# Patient Record
Sex: Male | Born: 1960 | Race: White | Hispanic: No | Marital: Single | Smoking: Current every day smoker
Health system: Southern US, Community
[De-identification: ages and names within clinical notes are randomized; demographics above are authoritative.]

## PROBLEM LIST (undated history)

## (undated) DIAGNOSIS — I509 Heart failure, unspecified: Secondary | ICD-10-CM

## (undated) DIAGNOSIS — N185 Chronic kidney disease, stage 5: Secondary | ICD-10-CM

## (undated) DIAGNOSIS — D649 Anemia, unspecified: Secondary | ICD-10-CM

## (undated) DIAGNOSIS — I2510 Atherosclerotic heart disease of native coronary artery without angina pectoris: Secondary | ICD-10-CM

## (undated) DIAGNOSIS — F341 Dysthymic disorder: Secondary | ICD-10-CM

## (undated) DIAGNOSIS — R011 Cardiac murmur, unspecified: Secondary | ICD-10-CM

## (undated) DIAGNOSIS — G959 Disease of spinal cord, unspecified: Secondary | ICD-10-CM

## (undated) DIAGNOSIS — R0600 Dyspnea, unspecified: Secondary | ICD-10-CM

## (undated) DIAGNOSIS — R251 Tremor, unspecified: Secondary | ICD-10-CM

## (undated) DIAGNOSIS — Z720 Tobacco use: Secondary | ICD-10-CM

## (undated) DIAGNOSIS — M1990 Unspecified osteoarthritis, unspecified site: Secondary | ICD-10-CM

## (undated) DIAGNOSIS — N289 Disorder of kidney and ureter, unspecified: Secondary | ICD-10-CM

## (undated) DIAGNOSIS — F329 Major depressive disorder, single episode, unspecified: Secondary | ICD-10-CM

## (undated) DIAGNOSIS — R569 Unspecified convulsions: Secondary | ICD-10-CM

## (undated) DIAGNOSIS — E119 Type 2 diabetes mellitus without complications: Secondary | ICD-10-CM

## (undated) DIAGNOSIS — I10 Essential (primary) hypertension: Secondary | ICD-10-CM

## (undated) DIAGNOSIS — J449 Chronic obstructive pulmonary disease, unspecified: Secondary | ICD-10-CM

## (undated) DIAGNOSIS — R059 Cough, unspecified: Secondary | ICD-10-CM

## (undated) DIAGNOSIS — F419 Anxiety disorder, unspecified: Secondary | ICD-10-CM

## (undated) DIAGNOSIS — R05 Cough: Secondary | ICD-10-CM

## (undated) DIAGNOSIS — F32A Depression, unspecified: Secondary | ICD-10-CM

## (undated) DIAGNOSIS — N186 End stage renal disease: Secondary | ICD-10-CM

## (undated) HISTORY — DX: Chronic obstructive pulmonary disease, unspecified: J44.9

## (undated) HISTORY — DX: Dysthymic disorder: F34.1

## (undated) HISTORY — DX: Disease of spinal cord, unspecified: G95.9

## (undated) HISTORY — DX: Essential (primary) hypertension: I10

## (undated) HISTORY — DX: Tobacco use: Z72.0

## (undated) HISTORY — DX: Atherosclerotic heart disease of native coronary artery without angina pectoris: I25.10

## (undated) HISTORY — DX: Type 2 diabetes mellitus without complications: E11.9

## (undated) HISTORY — DX: Disorder of kidney and ureter, unspecified: N28.9

---

## 2005-07-06 ENCOUNTER — Encounter: Admission: RE | Admit: 2005-07-06 | Discharge: 2005-07-06 | Payer: Self-pay | Admitting: Thoracic Surgery

## 2008-12-12 ENCOUNTER — Emergency Department (HOSPITAL_COMMUNITY): Admission: EM | Admit: 2008-12-12 | Discharge: 2008-12-12 | Payer: Self-pay | Admitting: Emergency Medicine

## 2011-10-17 ENCOUNTER — Ambulatory Visit: Payer: Self-pay | Admitting: Internal Medicine

## 2011-10-17 VITALS — BP 132/76 | HR 72 | Temp 97.9°F | Resp 18 | Ht 74.0 in | Wt 192.0 lb

## 2011-10-17 DIAGNOSIS — S0500XA Injury of conjunctiva and corneal abrasion without foreign body, unspecified eye, initial encounter: Secondary | ICD-10-CM

## 2011-10-17 DIAGNOSIS — F341 Dysthymic disorder: Secondary | ICD-10-CM

## 2011-10-17 DIAGNOSIS — E119 Type 2 diabetes mellitus without complications: Secondary | ICD-10-CM

## 2011-10-17 DIAGNOSIS — R739 Hyperglycemia, unspecified: Secondary | ICD-10-CM

## 2011-10-17 HISTORY — DX: Type 2 diabetes mellitus without complications: E11.9

## 2011-10-17 HISTORY — DX: Dysthymic disorder: F34.1

## 2011-10-17 MED ORDER — CITALOPRAM HYDROBROMIDE 40 MG PO TABS: 40.0000 mg | ORAL_TABLET | Freq: Every day | ORAL | Status: DC

## 2011-10-17 MED ORDER — PREDNISONE 20 MG PO TABS: ORAL_TABLET | ORAL | Status: DC

## 2011-10-17 MED ORDER — TOBRAMYCIN 0.3 % OP SOLN: OPHTHALMIC | Status: DC

## 2011-10-17 MED ORDER — ALPRAZOLAM 1 MG PO TABS: 1.0000 mg | ORAL_TABLET | Freq: Every evening | ORAL | Status: AC | PRN

## 2011-10-17 NOTE — Progress Notes (Signed)
  Subjective:    Patient ID: [name] [name], male    DOB: [birth year], 51 y.o.   MRN: [id number]  [facility] been rubbing her eyes do 2 allergies for the past week and now has pain in the right with change in vision  Recent change in living arrangements/lost his job in [place] and had to move back to [place] to live with mother/25 year old son recently out of jail and back causing problems-He has continued on medicines for depression and anxiety from Dr. 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     Objective:   Physical Exam Vital signs normal Right with a large corneal defect at 9:00 seen with staining Pupils equal round reactive to light and accommodation Both conjunctiva injected Nares boggy Heart regular Chest clear Affect stable       Results for orders placed in visit on 10/17/11  POCT GLYCOSYLATED HEMOGLOBIN (HGB A1C)      Component Value Range   Hemoglobin A1C 6.4      Assessment & Plan:  Problem #1 corneal abrasion Tobramycin 2 drops 4 times a day for 5-7 days Call for ophthalmology referral if not well  Problem #2Depression with anxiety Celexa 40 daily #90 with 3 refills Alprazolam 1 mg 3 times a day #90 w/5 refills Followup six-month  Problem #3 hyperglycemia Stable with his diet  Hyperlipidemia and hypertension are not or no longer present/Recheck 6 months

## 2012-05-16 ENCOUNTER — Other Ambulatory Visit: Payer: Self-pay | Admitting: Internal Medicine

## 2012-05-16 NOTE — Telephone Encounter (Signed)
Pt was due for follow-up in September 2013

## 2017-09-09 ENCOUNTER — Encounter (HOSPITAL_COMMUNITY): Payer: Self-pay | Admitting: Emergency Medicine

## 2017-09-09 ENCOUNTER — Emergency Department (HOSPITAL_COMMUNITY): Payer: Self-pay

## 2017-09-09 ENCOUNTER — Emergency Department (HOSPITAL_COMMUNITY)
Admission: EM | Admit: 2017-09-09 | Discharge: 2017-09-10 | Disposition: A | Discharge status: Home / Self Care | Payer: Self-pay | Attending: Emergency Medicine | Admitting: Emergency Medicine

## 2017-09-09 DIAGNOSIS — I2510 Atherosclerotic heart disease of native coronary artery without angina pectoris: Secondary | ICD-10-CM

## 2017-09-09 DIAGNOSIS — F1010 Alcohol abuse, uncomplicated: Secondary | ICD-10-CM

## 2017-09-09 DIAGNOSIS — J449 Chronic obstructive pulmonary disease, unspecified: Secondary | ICD-10-CM

## 2017-09-09 DIAGNOSIS — G540 Brachial plexus disorders: Secondary | ICD-10-CM | POA: Insufficient documentation

## 2017-09-09 DIAGNOSIS — Z720 Tobacco use: Secondary | ICD-10-CM

## 2017-09-09 DIAGNOSIS — R3121 Asymptomatic microscopic hematuria: Secondary | ICD-10-CM

## 2017-09-09 DIAGNOSIS — F17200 Nicotine dependence, unspecified, uncomplicated: Secondary | ICD-10-CM | POA: Insufficient documentation

## 2017-09-09 DIAGNOSIS — R809 Proteinuria, unspecified: Secondary | ICD-10-CM

## 2017-09-09 DIAGNOSIS — G9520 Unspecified cord compression: Secondary | ICD-10-CM

## 2017-09-09 LAB — BASIC METABOLIC PANEL
ANION GAP: 12 (ref 5–15)
BUN: 19 mg/dL (ref 6–20)
CHLORIDE: 99 mmol/L — AB (ref 101–111)
CO2: 25 mmol/L (ref 22–32)
Calcium: 8.9 mg/dL (ref 8.9–10.3)
Creatinine, Ser: 1.37 mg/dL — ABNORMAL HIGH (ref 0.61–1.24)
GFR calc non Af Amer: 55 mL/min — ABNORMAL LOW (ref >60)
Glucose, Bld: 151 mg/dL — ABNORMAL HIGH (ref 65–99)
POTASSIUM: 4.9 mmol/L (ref 3.5–5.1)
Sodium: 136 mmol/L (ref 135–145)

## 2017-09-09 LAB — URINALYSIS, ROUTINE W REFLEX MICROSCOPIC
BILIRUBIN URINE: NEGATIVE
Glucose, UA: NEGATIVE mg/dL
KETONES UR: NEGATIVE mg/dL
LEUKOCYTES UA: NEGATIVE
NITRITE: NEGATIVE
PH: 6.5 (ref 5.0–8.0)
PROTEIN: 100 mg/dL — AB
Specific Gravity, Urine: 1.015 (ref 1.005–1.030)
Squamous Epithelial / LPF: NONE SEEN

## 2017-09-09 LAB — CBC
HEMATOCRIT: 50.9 % (ref 39.0–52.0)
HEMOGLOBIN: 17.5 g/dL — AB (ref 13.0–17.0)
MCH: 33.8 pg (ref 26.0–34.0)
MCHC: 34.4 g/dL (ref 30.0–36.0)
MCV: 98.5 fL (ref 78.0–100.0)
Platelets: 335 10*3/uL (ref 150–400)
RBC: 5.17 MIL/uL (ref 4.22–5.81)
RDW: 13.2 % (ref 11.5–15.5)
WBC: 11.7 10*3/uL — AB (ref 4.0–10.5)

## 2017-09-09 LAB — TROPONIN I

## 2017-09-09 LAB — CBG MONITORING, ED
GLUCOSE-CAPILLARY: 161 mg/dL — AB (ref 65–99)
Glucose-Capillary: 153 mg/dL — ABNORMAL HIGH (ref 65–99)

## 2017-09-09 MED ORDER — MELOXICAM 15 MG PO TABS: 15.0000 mg | ORAL_TABLET | Freq: Every day | ORAL | 0 refills | Status: DC

## 2017-09-09 MED ORDER — PREDNISONE 10 MG (21) PO TBPK: ORAL_TABLET | ORAL | 0 refills | Status: DC

## 2017-09-09 MED ORDER — DEXAMETHASONE SODIUM PHOSPHATE 10 MG/ML IJ SOLN
10.0000 mg | Freq: Once | INTRAMUSCULAR | Status: AC
  Administered 2017-09-09: 10 mg via INTRAVENOUS
  Filled 2017-09-09: qty 1

## 2017-09-09 MED ORDER — NICOTINE 21 MG/24HR TD PT24
21.0000 mg | MEDICATED_PATCH | Freq: Every day | TRANSDERMAL | Status: DC
  Administered 2017-09-09: 21 mg via TRANSDERMAL
  Filled 2017-09-09: qty 1

## 2017-09-09 MED ORDER — LORAZEPAM 2 MG/ML IJ SOLN
1.0000 mg | Freq: Once | INTRAMUSCULAR | Status: AC
  Administered 2017-09-09: 1 mg via INTRAVENOUS
  Filled 2017-09-09: qty 1

## 2017-09-09 NOTE — ED Notes (Addendum)
Lab notified to add on trop to existing blood.

## 2017-09-09 NOTE — ED Notes (Signed)
Neurosurgery NP at bedside

## 2017-09-09 NOTE — Discharge Instructions (Signed)
1) will need to follow-up with Dr. Saintclair Halsted in his office on Tuesday or Thursday 2) you have protein in blood in your urine and this will need to be rechecked in about 1 week 3) you have an elevated blood sugar which may elevate further with use of prednisone to treat the swelling of the disc in your neck.  If you begin to have severe hunger, severe thirst, and urinating frequently large volumes of urine this may be a sign that you have diabetes. 4) I have placed a call with our case manager to try and get you an appointment with the [facility] as soon as possible.  They are not available during the weekend hours.  Get help right away if: Your pain gets much worse and cannot be controlled with medicines. You have weakness or numbness in your hand, arm, face, or leg. You have a high fever. You have a stiff, rigid neck. You lose control of your bowels or your bladder (have incontinence). You have trouble with walking, balance, or speaking.

## 2017-09-09 NOTE — ED Notes (Signed)
Pt transported to MRI at this time 

## 2017-09-09 NOTE — ED Provider Notes (Signed)
[facility] DEPT Provider Note   CSN: [id number] Arrival date & time: 09/09/17  1300     History   Chief Complaint Chief Complaint  Patient presents with  . Tingling  . Numbness    HPI [name] [name] is a 57 y.o. male who presents the emergency department with chief complaint of left arm numbness and weakness.  Patient states that he has had progressively worsening paresthesia, numbness and pain in the left arm for the past 4 months.  It has gotten severe over the past week and he is unable to maintain strength in the arm for normal periods of time.  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  He states that about 12 years ago he was diagnosed with thoracic outlet syndrome.  He does not seek regular medical care because he is uninsured.  The patient admits to smoking cigarettes and marijuana daily.  He also drinks 6-12 beers every night but denies symptoms of withdrawal, shakes when he does not drink or seizures.  The patient states that he is also noticed twinges of pain in the left chest which she describes as feeling "like a stitch."  He denies shortness of breath or chest pain otherwise.  The patient has bilateral lower extremity numbness and tingling on a regular breast basis and pitting edema regularly.  He is also concerned because his right arm is starting to feel numb and feels the way his left arm did when it first began.Denies headaches, light headedness, visual disturbances.    HPI  History reviewed. No pertinent past medical history.  Patient Active Problem List   Diagnosis Date Noted  . Hyperglycemia 10/17/2011  . 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 No pertinent surgical history.     Home Medications    Prior to Admission medications   Medication Sig Start Date End Date Taking? Authorizing Provider  ALPRAZolam Duanne Moron) 1 MG tablet Take  1 mg by mouth once as needed for anxiety.   Yes [provider]  ibuprofen (ADVIL,MOTRIN) 200 MG tablet Take 400 mg by mouth every 6 (six) hours as needed for moderate pain.   Yes [provider]  meloxicam (MOBIC) 15 MG tablet Take 1 tablet (15 mg total) by mouth daily. Take 1 daily with food. 09/09/17   Margarita Mail, PA-C  predniSONE (STERAPRED UNI-PAK 21 TAB) 10 MG (21) TBPK tablet Use as directed 09/09/17   Margarita Mail, PA-C    Family History No family history on file.  Social History Social History   Tobacco Use  . Smoking status: Current Every Day Smoker  . Smokeless tobacco: Never Used  Substance Use Topics  . Alcohol use: Yes  . Drug use: No     Allergies   Patient has no known allergies.   Review of Systems Review of Systems  Ten systems reviewed and are negative for acute change, except as noted in the HPI.   Physical Exam Updated Vital Signs BP (!) 164/112 (BP Location: Right Arm)   Pulse 81   Temp 97.8 F (36.6 C) (Oral)   Resp 17   Ht 6' (1.829 m)   Wt 86.2 kg (190 lb)   SpO2 98%   BMI 25.77 kg/m   Physical Exam  Constitutional: He is oriented to person, place, and time. He appears well-developed and well-nourished. No distress.  HENT:  Head: Normocephalic and atraumatic.  Eyes: Conjunctivae are normal. No scleral icterus.  Neck: Normal range of motion. Neck supple.  Cardiovascular: Normal rate, regular rhythm and normal heart sounds.  Pulmonary/Chest: Effort normal and breath sounds normal. No respiratory distress.  Abdominal: Soft. There is no tenderness.  Musculoskeletal: He exhibits no edema.  Weakness of the left upper extremity  fasciculations  Neurological: He is alert and oriented to person, place, and time.  Skin: Skin is warm and dry. He is not diaphoretic.  Psychiatric: His behavior is normal.  Nursing note and vitals reviewed.    ED Treatments / Results  Labs (all labs ordered are listed, but only abnormal  results are displayed) Labs Reviewed  BASIC METABOLIC PANEL - Abnormal; Notable for the following components:      Result Value   Chloride 99 (*)    Glucose, Bld 151 (*)    Creatinine, Ser 1.37 (*)    GFR calc non Af Amer 55 (*)    All other components within normal limits  CBC - Abnormal; Notable for the following components:   WBC 11.7 (*)    Hemoglobin 17.5 (*)    All other components within normal limits  URINALYSIS, ROUTINE W REFLEX MICROSCOPIC - Abnormal; Notable for the following components:   Hgb urine dipstick LARGE (*)    Protein, ur 100 (*)    Bacteria, UA RARE (*)    All other components within normal limits  CBG MONITORING, ED - Abnormal; Notable for the following components:   Glucose-Capillary 161 (*)    All other components within normal limits  CBG MONITORING, ED - Abnormal; Notable for the following components:   Glucose-Capillary 153 (*)    All other components within normal limits  TROPONIN I    EKG  EKG Interpretation None       Radiology Dg Forearm Left  Result Date: 09/09/2017 CLINICAL DATA:  Rule out metallic foreign body prior to MRI. EXAM: LEFT FOREARM - 2 VIEW COMPARISON:  None. FINDINGS: No radiopaque foreign body. IV catheter about the distal forearm. No acute osseous abnormality. Cortical margins of the radius and ulna are intact. No focal soft tissue abnormality. IMPRESSION: No radiopaque foreign body in the forearm to preclude MRI imaging. Electronically Signed   By: Jeb Levering M.D.   On: 09/09/2017 21:32   Dg Forearm Right  Result Date: 09/09/2017 CLINICAL DATA:  Rule out metallic foreign body prior to MRI. EXAM: RIGHT FOREARM - 2 VIEW COMPARISON:  None. FINDINGS: No radiopaque foreign body. No acute osseous abnormalities. Cortical margins of the radius and ulna are intact. No focal soft tissue abnormality. IMPRESSION: No radiopaque foreign body in the right forearm to preclude MRI imaging. Electronically Signed   By: Jeb Levering  M.D.   On: 09/09/2017 21:33   Ct Head Wo Contrast  Result Date: 09/09/2017 CLINICAL DATA:  numbness and tingling to the right arm and hand that started 4 months ago EXAM: CT HEAD WITHOUT CONTRAST CT CERVICAL SPINE WITHOUT CONTRAST TECHNIQUE: Multidetector CT imaging of the head and cervical spine was performed following the standard protocol without intravenous contrast. Multiplanar CT image reconstructions of the cervical spine were also generated. COMPARISON:  None. FINDINGS: CT HEAD FINDINGS Brain: No acute intracranial hemorrhage. No focal mass lesion. No CT evidence of acute infarction. No midline shift or mass effect. No hydrocephalus. Basilar cisterns are patent. Vascular: No hyperdense vessel or unexpected calcification. Skull: Normal. Negative for fracture or focal lesion. Sinuses/Orbits: Paranasal sinuses and mastoid air cells are clear. Orbits  are clear. Other: None. CT CERVICAL SPINE FINDINGS Alignment: Normal alignment of the cervical vertebral bodies. Skull base and vertebrae: Normal craniocervical junction. No loss of vertebral body height or disc height. Normal facet articulation. No evidence of fracture. Soft tissues and spinal canal: No prevertebral soft tissue swelling. No perispinal or epidural hematoma. Disc levels: No significant neural foraminal narrowing. Central disc bulge at C4-C5 (image 56/9) indents the ventral thecal sac and contacts the spinal cord. Upper chest: Nodular density at the RIGHT lung apex partially calcified, Other: None IMPRESSION: 1. No acute intracranial findings. 2. Central disc bulge at C4-C5. 3. No significant neural foraminal narrowing identified. 4. Nodular density RIGHT lung apex could represent benign scarring. Recommend CT thorax without contrast to further evaluate. Electronically Signed   By: Suzy Bouchard M.D.   On: 09/09/2017 16:38   Ct Chest Wo Contrast  Result Date: 09/09/2017 CLINICAL DATA:  57 year old male with history of shortness of breath.  EXAM: CT CHEST WITHOUT CONTRAST TECHNIQUE: Multidetector CT imaging of the chest was performed following the standard protocol without IV contrast. COMPARISON:  Chest CT 07/06/2005. FINDINGS: Cardiovascular: Heart size is normal. There is no significant pericardial fluid, thickening or pericardial calcification. There is aortic atherosclerosis, as well as atherosclerosis of the great vessels of the mediastinum and the coronary arteries, including calcified atherosclerotic plaque in the left main, left anterior descending, left circumflex and right coronary arteries. Mediastinum/Nodes: No pathologically enlarged mediastinal or hilar lymph nodes. Please note that accurate exclusion of hilar adenopathy is limited on noncontrast CT scans. Esophagus is unremarkable in appearance. No axillary lymphadenopathy. Lungs/Pleura: Bilateral apical nodular pleuroparenchymal thickening and architectural distortion, most compatible with areas of chronic post infectious or inflammatory scarring. No acute consolidative airspace disease. No pleural effusions. Dependent areas of architectural distortion and volume loss in the lower lobes of lungs bilaterally may reflect areas of chronic scarring and/or atelectasis. No suspicious appearing pulmonary nodules or masses. Mild diffuse bronchial wall thickening with mild centrilobular and paraseptal emphysema. Upper Abdomen: Aortic atherosclerosis. Musculoskeletal: There are no aggressive appearing lytic or blastic lesions noted in the visualized portions of the skeleton. IMPRESSION: 1. No acute findings in the thorax to account for the patient's symptoms. 2. Mild diffuse bronchial wall thickening with mild centrilobular and paraseptal emphysema; imaging findings suggestive of underlying COPD. 3. Aortic atherosclerosis, in addition to left main and 3 vessel coronary artery disease. Please note that although the presence of coronary artery calcium documents the presence of coronary artery  disease, the severity of this disease and any potential stenosis cannot be assessed on this non-gated CT examination. Assessment for potential risk factor modification, dietary therapy or pharmacologic therapy may be warranted, if clinically indicated. Aortic Atherosclerosis (ICD10-I70.0) and Emphysema (ICD10-J43.9). Electronically Signed   By: Vinnie Langton M.D.   On: 09/09/2017 18:35   Ct Cervical Spine Wo Contrast  Result Date: 09/09/2017 CLINICAL DATA:  numbness and tingling to the right arm and hand that started 4 months ago EXAM: CT HEAD WITHOUT CONTRAST CT CERVICAL SPINE WITHOUT CONTRAST TECHNIQUE: Multidetector CT imaging of the head and cervical spine was performed following the standard protocol without intravenous contrast. Multiplanar CT image reconstructions of the cervical spine were also generated. COMPARISON:  None. FINDINGS: CT HEAD FINDINGS Brain: No acute intracranial hemorrhage. No focal mass lesion. No CT evidence of acute infarction. No midline shift or mass effect. No hydrocephalus. Basilar cisterns are patent. Vascular: No hyperdense vessel or unexpected calcification. Skull: Normal. Negative for fracture or focal lesion. Sinuses/Orbits:  Paranasal sinuses and mastoid air cells are clear. Orbits are clear. Other: None. CT CERVICAL SPINE FINDINGS Alignment: Normal alignment of the cervical vertebral bodies. Skull base and vertebrae: Normal craniocervical junction. No loss of vertebral body height or disc height. Normal facet articulation. No evidence of fracture. Soft tissues and spinal canal: No prevertebral soft tissue swelling. No perispinal or epidural hematoma. Disc levels: No significant neural foraminal narrowing. Central disc bulge at C4-C5 (image 56/9) indents the ventral thecal sac and contacts the spinal cord. Upper chest: Nodular density at the RIGHT lung apex partially calcified, Other: None IMPRESSION: 1. No acute intracranial findings. 2. Central disc bulge at C4-C5. 3. No  significant neural foraminal narrowing identified. 4. Nodular density RIGHT lung apex could represent benign scarring. Recommend CT thorax without contrast to further evaluate. Electronically Signed   By: Suzy Bouchard M.D.   On: 09/09/2017 16:38   Mr Cervical Spine Wo Contrast  Result Date: 09/09/2017 CLINICAL DATA:  Bilateral upper extremity numbness. EXAM: MRI CERVICAL SPINE WITHOUT CONTRAST TECHNIQUE: Multiplanar, multisequence MR imaging of the cervical spine was performed. No intravenous contrast was administered. COMPARISON:  CT cervical spine 09/09/2017 FINDINGS: Alignment: Physiologic. Vertebrae: No fracture, evidence of discitis, or bone lesion. Cord: There is hyperintense T2-weighted signal within the spinal cord at the C4-5 level. Posterior Fossa, vertebral arteries, paraspinal tissues: Visualized posterior fossa is normal. Vertebral artery flow voids are preserved. No prevertebral soft tissue swelling. Disc levels: C1-C2: Normal. C2-C3: Normal disc space and facets. No spinal canal or neuroforaminal stenosis. C3-C4: Small central disc protrusion, greatest in the right subarticular zone. No central spinal canal stenosis. Moderate right foraminal stenosis. C4-C5: Intermediate sized central disc extrusion with inferior migration to the C5 pedicle level. This causes severe spinal canal stenosis, narrowing the caliber of the central spinal canal to 2 mm. There is compression of the spinal cord with deformity and hyperintense T2-weighted signal. Severe bilateral neural foraminal stenosis C5-C6: Medium-sized right subarticular disc osteophyte complex with moderate narrowing of the right neural foramen. No spinal canal stenosis. C6-C7: Normal disc space and facets. No spinal canal or neuroforaminal stenosis. C7-T1: Normal disc space and facets. No spinal canal or neuroforaminal stenosis. IMPRESSION: 1. Cord compression at the C4-5 level secondary to intermediate sized central disc extrusion with inferior  migration. There is hyperintense T2-weighted signal within the cord, likely indicating edema. 2. Severe bilateral C4-5 neural foraminal narrowing. 3. Moderate right neural foraminal stenosis at C3-4 and C5-6. Critical Value/emergent results were called by telephone at the time of interpretation on 09/09/2017 at 10:52 pm to Dr. Margarita Mail , who verbally acknowledged these results. Electronically Signed   By: Ulyses Jarred M.D.   On: 09/09/2017 22:59   Dg Hand 2 View Right  Result Date: 09/09/2017 CLINICAL DATA:  Evaluate for metallic foreign body prior to MRI. EXAM: RIGHT HAND - 2 VIEW COMPARISON:  None. FINDINGS: External sensor obscures the distal right third finger. Otherwise no radiopaque foreign body. No fracture, dislocation or suspicious focal osseous lesion. Mild osteoarthritis at the first carpometacarpal joint and at the second through fourth metacarpophalangeal joints. IMPRESSION: No radiopaque foreign body.  Mild polyarticular osteoarthritis. Electronically Signed   By: Ilona Sorrel M.D.   On: 09/09/2017 21:32   Dg Hand 2 View Left  Result Date: 09/09/2017 CLINICAL DATA:  Previous injury with concern for metallic foreign body EXAM: LEFT HAND - 2 VIEW COMPARISON:  None. FINDINGS: Frontal and lateral views were obtained. No metallic foreign body evident. Intravenous catheter present. No  fracture or dislocation. Joint spaces appear normal. No erosive change. IMPRESSION: No fracture or dislocation. No evident metallic foreign body. No appreciable arthropathy. Electronically Signed   By: Lowella Grip III M.D.   On: 09/09/2017 21:31   Dg Humerus Left  Result Date: 09/09/2017 CLINICAL DATA:  Rule out metallic foreign body prior to MRI. EXAM: LEFT HUMERUS - 2+ VIEW COMPARISON:  None. FINDINGS: No radiopaque foreign body in the left humerus. There is no evidence of fracture or other focal bone lesions. Soft tissues are unremarkable. IMPRESSION: No radiopaque foreign body in the left humerus to  preclude MRI imaging. Electronically Signed   By: Jeb Levering M.D.   On: 09/09/2017 21:34   Dg Humerus Right  Result Date: 09/09/2017 CLINICAL DATA:  Rule out metallic foreign body prior to MRI. EXAM: RIGHT HUMERUS - 2+ VIEW COMPARISON:  None. FINDINGS: No radiopaque foreign body in the right humerus/upper arm. There is no evidence of fracture or other focal bone lesions. Soft tissues are unremarkable. IMPRESSION: No radiopaque foreign body in the right humerus to preclude MRI imaging Electronically Signed   By: Jeb Levering M.D.   On: 09/09/2017 21:35    Procedures Procedures (including critical care time)  Medications Ordered in ED Medications  nicotine (NICODERM CQ - dosed in mg/24 hours) patch 21 mg (21 mg Transdermal Patch Applied 09/09/17 2018)  LORazepam (ATIVAN) injection 1 mg (1 mg Intravenous Given 09/09/17 2215)  dexamethasone (DECADRON) injection 10 mg (10 mg Intravenous Given 09/09/17 2306)     Initial Impression / Assessment and Plan / ED Course  I have reviewed the triage vital signs and the nursing notes.  Pertinent labs & imaging results that were available during my care of the patient were reviewed by me and considered in my medical decision making (see chart for details).     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  His other diagnoses are listed below.  The patient has been advised of all findings.  He was seen in the emergency department by Dr. Windy Carina nurse practitioner and will follow up in his office on Tuesday or Thursday of the coming week.  They advised giving a Sterapred Dosepak.  He was given IV Decadron here in the emergency department .   I have also contacted our case manager for the patient is he needs inpatient follow-up as soon as possible.  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

## 2017-09-09 NOTE — ED Triage Notes (Signed)
Patient here from home with complaints of numbness and tingling to the right arm and hand that started 4 months ago. Increasing worse. Chest pain "off and on" for 1 month. Reports "Im old school, I never go to the doctor".

## 2017-09-09 NOTE — Consult Note (Signed)
Reason for Consult:cervical disc herniation Referring Physician: EDP  [name] [name] is an 57 y.o. male.   HPI:  57 year old patient comes in today with progressive weakness. States that he has been dealing with NT and weakness in his arms and hands for the last 4 months. His left 4th and 5th finger have been contracted for the last 6 months. He denies any pain in his neck or arms. Reports having trouble picking things up and holding items especially in his left hand. He has not fallen recently but states that his gait has been off. Does have a history of thoracic outlet syndrome. He works in [employer] and states that the weakness has been interfering with his job. He is a heavy drinker.   History reviewed. No pertinent past medical history.  History reviewed. No pertinent surgical history.  No Known Allergies  Social History   Tobacco Use  . Smoking status: Current Every Day Smoker  . Smokeless tobacco: Never Used  Substance Use Topics  . Alcohol use: Yes    No family history on file.   Review of Systems  Positive ROS: NTW in bilateral upper extremities  All other systems have been reviewed and were otherwise negative with the exception of those mentioned in the HPI and as above.  Objective: Vital signs in last 24 hours: Temp:  [97.7 F (36.5 C)-97.8 F (36.6 C)] 97.8 F (36.6 C) (03/16 1422) Pulse Rate:  [58-85] 85 (03/16 2036) Resp:  [14-25] 22 (03/16 2036) BP: (131-163)/(76-98) 163/98 (03/16 2036) SpO2:  [96 %-99 %] 96 % (03/16 2036) Weight:  [86.2 kg (190 lb)] 86.2 kg (190 lb) (03/16 1342)  General Appearance: Alert, cooperative, no distress, appears stated age Head: Normocephalic, without obvious abnormality, atraumatic Eyes: PERRL, conjunctiva/corneas clear, EOM's intact, fundi benign, both eyes      Ears: Normal TM's and external ear canals, both ears Throat: benign Neck: Supple, symmetrical, trachea midline, no adenopathy; thyroid: No  enlargement/tenderness/nodules; no carotid bruit or JVD Back: Symmetric, no curvature, ROM normal, no CVA tenderness Lungs: Clear to auscultation bilaterally, respirations unlabored Heart: Regular rate and rhythm, S1 and S2 normal, no murmur, rub or gallop Abdomen: Soft, non-tender, bowel sounds active all four quadrants, no masses, no organomegaly Extremities: Extremities normal, atraumatic, no cyanosis or edema Pulses: 2+ and symmetric all extremities Skin: Skin color, texture, turgor normal, no rashes or lesions  NEUROLOGIC:   Mental status: A&O x4, no aphasia, good attention span, Memory and fund of knowledge Motor Exam - grossly normal, normal tone and bulk Sensory Exam - grossly normal Reflexes: hyperreflexive, Positive Hoffman's Coordination - grossly normal Gait - grossly normal Balance - grossly normal Cranial Nerves: I: smell Not tested  II: visual acuity  OS: na    OD: na  II: visual fields   II: pupils Equal, round, reactive to light  III,VII: ptosis None  III,IV,VI: extraocular muscles  Full ROM  V: mastication   V: facial light touch sensation    V,VII: corneal reflex    VII: facial muscle function - upper    VII: facial muscle function - lower   VIII: hearing   IX: soft palate elevation    IX,X: gag reflex   XI: trapezius strength    XI: sternocleidomastoid strength   XI: neck flexion strength    XII: tongue strength  Normal    Data Review Lab Results  Component Value Date   WBC 11.7 (H) 09/09/2017   HGB 17.5 (H) 09/09/2017   HCT  50.9 09/09/2017   MCV 98.5 09/09/2017   PLT 335 09/09/2017   Lab Results  Component Value Date   NA 136 09/09/2017   K 4.9 09/09/2017   CL 99 (L) 09/09/2017   CO2 25 09/09/2017   BUN 19 09/09/2017   CREATININE 1.37 (H) 09/09/2017   GLUCOSE 151 (H) 09/09/2017   No results found for: INR, PROTIME  Radiology: Ct Head Wo Contrast  Result Date: 09/09/2017 CLINICAL DATA:  numbness and tingling to the right arm and hand  that started 4 months ago EXAM: CT HEAD WITHOUT CONTRAST CT CERVICAL SPINE WITHOUT CONTRAST TECHNIQUE: Multidetector CT imaging of the head and cervical spine was performed following the standard protocol without intravenous contrast. Multiplanar CT image reconstructions of the cervical spine were also generated. COMPARISON:  None. FINDINGS: CT HEAD FINDINGS Brain: No acute intracranial hemorrhage. No focal mass lesion. No CT evidence of acute infarction. No midline shift or mass effect. No hydrocephalus. Basilar cisterns are patent. Vascular: No hyperdense vessel or unexpected calcification. Skull: Normal. Negative for fracture or focal lesion. Sinuses/Orbits: Paranasal sinuses and mastoid air cells are clear. Orbits are clear. Other: None. CT CERVICAL SPINE FINDINGS Alignment: Normal alignment of the cervical vertebral bodies. Skull base and vertebrae: Normal craniocervical junction. No loss of vertebral body height or disc height. Normal facet articulation. No evidence of fracture. Soft tissues and spinal canal: No prevertebral soft tissue swelling. No perispinal or epidural hematoma. Disc levels: No significant neural foraminal narrowing. Central disc bulge at C4-C5 (image 56/9) indents the ventral thecal sac and contacts the spinal cord. Upper chest: Nodular density at the RIGHT lung apex partially calcified, Other: None IMPRESSION: 1. No acute intracranial findings. 2. Central disc bulge at C4-C5. 3. No significant neural foraminal narrowing identified. 4. Nodular density RIGHT lung apex could represent benign scarring. Recommend CT thorax without contrast to further evaluate. Electronically Signed   By: Suzy Bouchard M.D.   On: 09/09/2017 16:38   Ct Chest Wo Contrast  Result Date: 09/09/2017 CLINICAL DATA:  57 year old male with history of shortness of breath. EXAM: CT CHEST WITHOUT CONTRAST TECHNIQUE: Multidetector CT imaging of the chest was performed following the standard protocol without IV  contrast. COMPARISON:  Chest CT 07/06/2005. FINDINGS: Cardiovascular: Heart size is normal. There is no significant pericardial fluid, thickening or pericardial calcification. There is aortic atherosclerosis, as well as atherosclerosis of the great vessels of the mediastinum and the coronary arteries, including calcified atherosclerotic plaque in the left main, left anterior descending, left circumflex and right coronary arteries. Mediastinum/Nodes: No pathologically enlarged mediastinal or hilar lymph nodes. Please note that accurate exclusion of hilar adenopathy is limited on noncontrast CT scans. Esophagus is unremarkable in appearance. No axillary lymphadenopathy. Lungs/Pleura: Bilateral apical nodular pleuroparenchymal thickening and architectural distortion, most compatible with areas of chronic post infectious or inflammatory scarring. No acute consolidative airspace disease. No pleural effusions. Dependent areas of architectural distortion and volume loss in the lower lobes of lungs bilaterally may reflect areas of chronic scarring and/or atelectasis. No suspicious appearing pulmonary nodules or masses. Mild diffuse bronchial wall thickening with mild centrilobular and paraseptal emphysema. Upper Abdomen: Aortic atherosclerosis. Musculoskeletal: There are no aggressive appearing lytic or blastic lesions noted in the visualized portions of the skeleton. IMPRESSION: 1. No acute findings in the thorax to account for the patient's symptoms. 2. Mild diffuse bronchial wall thickening with mild centrilobular and paraseptal emphysema; imaging findings suggestive of underlying COPD. 3. Aortic atherosclerosis, in addition to left main and 3 vessel  coronary artery disease. Please note that although the presence of coronary artery calcium documents the presence of coronary artery disease, the severity of this disease and any potential stenosis cannot be assessed on this non-gated CT examination. Assessment for potential  risk factor modification, dietary therapy or pharmacologic therapy may be warranted, if clinically indicated. Aortic Atherosclerosis (ICD10-I70.0) and Emphysema (ICD10-J43.9). Electronically Signed   By: Vinnie Langton M.D.   On: 09/09/2017 18:35   Ct Cervical Spine Wo Contrast  Result Date: 09/09/2017 CLINICAL DATA:  numbness and tingling to the right arm and hand that started 4 months ago EXAM: CT HEAD WITHOUT CONTRAST CT CERVICAL SPINE WITHOUT CONTRAST TECHNIQUE: Multidetector CT imaging of the head and cervical spine was performed following the standard protocol without intravenous contrast. Multiplanar CT image reconstructions of the cervical spine were also generated. COMPARISON:  None. FINDINGS: CT HEAD FINDINGS Brain: No acute intracranial hemorrhage. No focal mass lesion. No CT evidence of acute infarction. No midline shift or mass effect. No hydrocephalus. Basilar cisterns are patent. Vascular: No hyperdense vessel or unexpected calcification. Skull: Normal. Negative for fracture or focal lesion. Sinuses/Orbits: Paranasal sinuses and mastoid air cells are clear. Orbits are clear. Other: None. CT CERVICAL SPINE FINDINGS Alignment: Normal alignment of the cervical vertebral bodies. Skull base and vertebrae: Normal craniocervical junction. No loss of vertebral body height or disc height. Normal facet articulation. No evidence of fracture. Soft tissues and spinal canal: No prevertebral soft tissue swelling. No perispinal or epidural hematoma. Disc levels: No significant neural foraminal narrowing. Central disc bulge at C4-C5 (image 56/9) indents the ventral thecal sac and contacts the spinal cord. Upper chest: Nodular density at the RIGHT lung apex partially calcified, Other: None IMPRESSION: 1. No acute intracranial findings. 2. Central disc bulge at C4-C5. 3. No significant neural foraminal narrowing identified. 4. Nodular density RIGHT lung apex could represent benign scarring. Recommend CT thorax  without contrast to further evaluate. Electronically Signed   By: Suzy Bouchard M.D.   On: 09/09/2017 16:38     Assessment/Plan: 57 year old patient comes in with progressive paraesthesias and weakness in upper extremities over the last couple months. CT reviewed which showed a moderate size bulging disc at C4-5 indenting the ventral thecal sac. Will get an MRI of his cervical spine. Given the chronicity of his symptoms I do not believe that he needs any emergent surgery at this time pending MRI results. Suggest sending him on steroids should his MRI not show anything needing emergent intervention. Follow up with [facility] in the office this week with Dr. Saintclair Halsted.    [name] [name] [name] [name] 09/09/2017 8:40 PM

## 2017-09-09 NOTE — ED Notes (Signed)
Pt provided with crackers and sprite per PA

## 2017-09-10 ENCOUNTER — Encounter: Payer: Self-pay | Admitting: Surgery

## 2017-09-10 ENCOUNTER — Encounter: Payer: Self-pay | Admitting: Neurosurgery

## 2017-09-10 NOTE — Progress Notes (Signed)
ED CM received in basket message concerning assisting patient with establishing follow up. Patient does not have a PCP or insurance, CM forwarded patient information to [facility] CM to reach out to schedule an appointment. ED CM will follow up with clinic's CM tomorrow.

## 2017-09-10 NOTE — Progress Notes (Unsigned)
Reviewed patient's MRI scan which shows severe stenosis at C4-5 and C5-6. Patient apparently was discharged from the ER prior to [place] being notified the MRI was complete for final determination about disposition. Upon review the MRI scan was noted that the patient had been discharged from the ER with appropriate follow-up however MRI scan did show significant stenosis spinal cord compression. I have attempted to contact the patient did leave him a voicemail to return my call today to assess his progress and determine whether he was clinically stable and improving and would be able to follow up on Tuesday or if he showed any evidence of decline, come back to the ER to be admitted the hospital.

## 2017-09-11 ENCOUNTER — Telehealth: Payer: Self-pay

## 2017-09-11 NOTE — Telephone Encounter (Signed)
Message received from Laurena Slimmer, RN CM requesting a hospital follow up appointment for the patient at [facility]. Call placed to the patient and explained the services provided at [facility] including, primary care, financial counseling, pharmacy assistance, social work.  He stated that he called Neurosurgery and has an appointment tomorrow - 09/12/17.  He is very anxious to hear what the plan will be for his care. He also spoke about his mounting bills and the financial strain he is experiencing. 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 This CM explained about [employer]  and [employer] and the benefits of meeting with a Development worker, community. Marland Kitchen He was focused on his appointment tomorrow and would not commit to scheduling an appointment with primary care. He repeated back the call back # for [facility] and said he would call back after the neurosurgery appointment.   Update provided to Wendi Maya, RN CM

## 2017-09-11 NOTE — Telephone Encounter (Signed)
Opened in error

## 2017-09-12 ENCOUNTER — Other Ambulatory Visit: Payer: Self-pay | Admitting: Neurosurgery

## 2017-09-12 ENCOUNTER — Telehealth: Payer: Self-pay | Admitting: General Practice

## 2017-09-12 NOTE — Telephone Encounter (Signed)
Call placed to the patient.  He said that he saw the neurosurgeon and is scheduled for surgery on 09/14/17. He noted that he also went to DSS and obtained an application for emergency medicaid.   He said that he will schedule a follow up appointment at [facility] and financial counseling after he has his surgery.  He expressed gratitude for all that assisted him in the ED and how smooth the process was to get this surgery scheduled so quickly.   Update provided to Laurena Slimmer, RN CM

## 2017-09-12 NOTE — Telephone Encounter (Signed)
Patient called with some questions to ask jane. Please fu at your earliest convenience.

## 2017-09-13 ENCOUNTER — Encounter (HOSPITAL_COMMUNITY): Payer: Self-pay | Admitting: *Deleted

## 2017-09-14 ENCOUNTER — Ambulatory Visit (HOSPITAL_COMMUNITY): Payer: Self-pay

## 2017-09-14 ENCOUNTER — Ambulatory Visit (HOSPITAL_COMMUNITY)
Admission: RE | Disposition: A | Discharge status: Home / Self Care | Payer: Self-pay | Source: Ambulatory Visit | Attending: Neurosurgery

## 2017-09-14 ENCOUNTER — Encounter (HOSPITAL_COMMUNITY): Payer: Self-pay | Admitting: Urology

## 2017-09-14 ENCOUNTER — Ambulatory Visit (HOSPITAL_COMMUNITY): Payer: Self-pay | Admitting: Certified Registered Nurse Anesthetist

## 2017-09-14 ENCOUNTER — Other Ambulatory Visit: Payer: Self-pay

## 2017-09-14 ENCOUNTER — Ambulatory Visit (HOSPITAL_COMMUNITY)
Admission: RE | Admit: 2017-09-14 | Discharge: 2017-09-15 | Disposition: A | Discharge status: Home / Self Care | Payer: Self-pay | Source: Ambulatory Visit | Attending: Neurosurgery | Admitting: Neurosurgery

## 2017-09-14 DIAGNOSIS — I700 Atherosclerosis of aorta: Secondary | ICD-10-CM | POA: Insufficient documentation

## 2017-09-14 DIAGNOSIS — Z79899 Other long term (current) drug therapy: Secondary | ICD-10-CM | POA: Insufficient documentation

## 2017-09-14 DIAGNOSIS — G959 Disease of spinal cord, unspecified: Secondary | ICD-10-CM

## 2017-09-14 DIAGNOSIS — M4712 Other spondylosis with myelopathy, cervical region: Secondary | ICD-10-CM | POA: Insufficient documentation

## 2017-09-14 DIAGNOSIS — F17200 Nicotine dependence, unspecified, uncomplicated: Secondary | ICD-10-CM | POA: Insufficient documentation

## 2017-09-14 DIAGNOSIS — M4802 Spinal stenosis, cervical region: Secondary | ICD-10-CM | POA: Insufficient documentation

## 2017-09-14 DIAGNOSIS — I2510 Atherosclerotic heart disease of native coronary artery without angina pectoris: Secondary | ICD-10-CM | POA: Insufficient documentation

## 2017-09-14 DIAGNOSIS — F419 Anxiety disorder, unspecified: Secondary | ICD-10-CM | POA: Insufficient documentation

## 2017-09-14 DIAGNOSIS — F329 Major depressive disorder, single episode, unspecified: Secondary | ICD-10-CM | POA: Insufficient documentation

## 2017-09-14 HISTORY — PX: ANTERIOR CERVICAL DECOMP/DISCECTOMY FUSION: SHX1161

## 2017-09-14 HISTORY — DX: Depression, unspecified: F32.A

## 2017-09-14 HISTORY — DX: Dyspnea, unspecified: R06.00

## 2017-09-14 HISTORY — DX: Cough, unspecified: R05.9

## 2017-09-14 HISTORY — DX: Anxiety disorder, unspecified: F41.9

## 2017-09-14 HISTORY — DX: Disease of spinal cord, unspecified: G95.9

## 2017-09-14 HISTORY — DX: Cough: R05

## 2017-09-14 HISTORY — DX: Major depressive disorder, single episode, unspecified: F32.9

## 2017-09-14 LAB — ABO/RH: ABO/RH(D): A POS

## 2017-09-14 LAB — TYPE AND SCREEN
ABO/RH(D): A POS
ANTIBODY SCREEN: NEGATIVE

## 2017-09-14 SURGERY — ANTERIOR CERVICAL DECOMPRESSION/DISCECTOMY FUSION 2 LEVELS
Anesthesia: General

## 2017-09-14 MED ORDER — MIDAZOLAM HCL 2 MG/2ML IJ SOLN
INTRAMUSCULAR | Status: DC | PRN
  Administered 2017-09-14: 2 mg via INTRAVENOUS

## 2017-09-14 MED ORDER — OXYCODONE HCL 5 MG PO TABS
ORAL_TABLET | ORAL | Status: AC
  Filled 2017-09-14: qty 1

## 2017-09-14 MED ORDER — CEFAZOLIN SODIUM-DEXTROSE 2-3 GM-%(50ML) IV SOLR
INTRAVENOUS | Status: DC | PRN
  Administered 2017-09-14: 2 g via INTRAVENOUS

## 2017-09-14 MED ORDER — 0.9 % SODIUM CHLORIDE (POUR BTL) OPTIME
TOPICAL | Status: DC | PRN
  Administered 2017-09-14: 1000 mL

## 2017-09-14 MED ORDER — IBUPROFEN 200 MG PO TABS: 400.0000 mg | ORAL_TABLET | Freq: Four times a day (QID) | ORAL | Status: DC | PRN

## 2017-09-14 MED ORDER — FENTANYL CITRATE (PF) 100 MCG/2ML IJ SOLN
25.0000 ug | INTRAMUSCULAR | Status: DC | PRN
  Administered 2017-09-14 (×2): 50 ug via INTRAVENOUS

## 2017-09-14 MED ORDER — DEXAMETHASONE SODIUM PHOSPHATE 10 MG/ML IJ SOLN
INTRAMUSCULAR | Status: DC | PRN
  Administered 2017-09-14: 10 mg via INTRAVENOUS

## 2017-09-14 MED ORDER — ONDANSETRON HCL 4 MG PO TABS: 4.0000 mg | ORAL_TABLET | Freq: Four times a day (QID) | ORAL | Status: DC | PRN

## 2017-09-14 MED ORDER — ONDANSETRON HCL 4 MG/2ML IJ SOLN
INTRAMUSCULAR | Status: AC
Start: 2017-09-14 — End: 2017-09-14
  Filled 2017-09-14: qty 2

## 2017-09-14 MED ORDER — SPIRITUS FRUMENTI
1.0000 | Freq: Four times a day (QID) | ORAL | Status: DC
  Administered 2017-09-14: 1 via ORAL
  Filled 2017-09-14 (×4): qty 1

## 2017-09-14 MED ORDER — PROPOFOL 10 MG/ML IV BOLUS
INTRAVENOUS | Status: DC | PRN
  Administered 2017-09-14: 200 mg via INTRAVENOUS

## 2017-09-14 MED ORDER — FENTANYL CITRATE (PF) 250 MCG/5ML IJ SOLN
INTRAMUSCULAR | Status: AC
  Filled 2017-09-14: qty 5

## 2017-09-14 MED ORDER — PHENOL 1.4 % MT LIQD: 1.0000 | OROMUCOSAL | Status: DC | PRN

## 2017-09-14 MED ORDER — ACETAMINOPHEN 160 MG/5ML PO SOLN: 325.0000 mg | ORAL | Status: DC | PRN

## 2017-09-14 MED ORDER — CEFAZOLIN SODIUM 1 G IJ SOLR
INTRAMUSCULAR | Status: AC
  Filled 2017-09-14: qty 20

## 2017-09-14 MED ORDER — DEXTROSE 5 % IV SOLN
INTRAVENOUS | Status: DC | PRN
  Administered 2017-09-14: 30 ug/min via INTRAVENOUS

## 2017-09-14 MED ORDER — SODIUM CHLORIDE 0.9 % IV SOLN: 250.0000 mL | INTRAVENOUS | Status: DC

## 2017-09-14 MED ORDER — MELOXICAM 7.5 MG PO TABS
15.0000 mg | ORAL_TABLET | Freq: Every day | ORAL | Status: DC
  Administered 2017-09-14 – 2017-09-15 (×2): 15 mg via ORAL
  Filled 2017-09-14 (×2): qty 2

## 2017-09-14 MED ORDER — SODIUM CHLORIDE 0.9% FLUSH
3.0000 mL | Freq: Two times a day (BID) | INTRAVENOUS | Status: DC
  Administered 2017-09-14 – 2017-09-15 (×2): 3 mL via INTRAVENOUS

## 2017-09-14 MED ORDER — DEXAMETHASONE SODIUM PHOSPHATE 10 MG/ML IJ SOLN
INTRAMUSCULAR | Status: AC
  Filled 2017-09-14: qty 1

## 2017-09-14 MED ORDER — SUGAMMADEX SODIUM 200 MG/2ML IV SOLN
INTRAVENOUS | Status: DC | PRN
  Administered 2017-09-14: 200 mg via INTRAVENOUS

## 2017-09-14 MED ORDER — EPHEDRINE SULFATE-NACL 50-0.9 MG/10ML-% IV SOSY
PREFILLED_SYRINGE | INTRAVENOUS | Status: DC | PRN
  Administered 2017-09-14: 5 mg via INTRAVENOUS

## 2017-09-14 MED ORDER — FENTANYL CITRATE (PF) 100 MCG/2ML IJ SOLN
INTRAMUSCULAR | Status: AC
  Administered 2017-09-14: 50 ug via INTRAVENOUS
  Filled 2017-09-14: qty 2

## 2017-09-14 MED ORDER — PREDNISONE 10 MG (21) PO TBPK: ORAL_TABLET | ORAL | Status: DC

## 2017-09-14 MED ORDER — ONDANSETRON HCL 4 MG/2ML IJ SOLN
INTRAMUSCULAR | Status: DC | PRN
  Administered 2017-09-14: 4 mg via INTRAVENOUS

## 2017-09-14 MED ORDER — SODIUM CHLORIDE 0.9 % IR SOLN
Status: DC | PRN
  Administered 2017-09-14: 16:00:00

## 2017-09-14 MED ORDER — MIDAZOLAM HCL 2 MG/2ML IJ SOLN
INTRAMUSCULAR | Status: AC
  Filled 2017-09-14: qty 2

## 2017-09-14 MED ORDER — CYCLOBENZAPRINE HCL 10 MG PO TABS: 10.0000 mg | ORAL_TABLET | Freq: Three times a day (TID) | ORAL | Status: DC | PRN

## 2017-09-14 MED ORDER — LACTATED RINGERS IV SOLN
INTRAVENOUS | Status: DC
  Administered 2017-09-14: 14:00:00 via INTRAVENOUS

## 2017-09-14 MED ORDER — THROMBIN 5000 UNITS EX SOLR
CUTANEOUS | Status: AC
Start: 2017-09-14 — End: 2017-09-14
  Filled 2017-09-14: qty 5000

## 2017-09-14 MED ORDER — DEXMEDETOMIDINE HCL IN NACL 200 MCG/50ML IV SOLN
INTRAVENOUS | Status: AC
  Filled 2017-09-14: qty 50

## 2017-09-14 MED ORDER — ROCURONIUM BROMIDE 10 MG/ML (PF) SYRINGE
PREFILLED_SYRINGE | INTRAVENOUS | Status: AC
Start: 2017-09-14 — End: 2017-09-14
  Filled 2017-09-14: qty 5

## 2017-09-14 MED ORDER — ROCURONIUM BROMIDE 10 MG/ML (PF) SYRINGE
PREFILLED_SYRINGE | INTRAVENOUS | Status: DC | PRN
  Administered 2017-09-14: 50 mg via INTRAVENOUS
  Administered 2017-09-14 (×2): 30 mg via INTRAVENOUS

## 2017-09-14 MED ORDER — MENTHOL 3 MG MT LOZG
1.0000 | LOZENGE | OROMUCOSAL | Status: DC | PRN
Start: 2017-09-14 — End: 2017-09-15

## 2017-09-14 MED ORDER — HYDROMORPHONE HCL 1 MG/ML IJ SOLN
0.5000 mg | INTRAMUSCULAR | Status: DC | PRN
  Administered 2017-09-14 – 2017-09-15 (×3): 0.5 mg via INTRAVENOUS
  Filled 2017-09-14 (×3): qty 0.5

## 2017-09-14 MED ORDER — PROPOFOL 10 MG/ML IV BOLUS
INTRAVENOUS | Status: AC
  Filled 2017-09-14: qty 20

## 2017-09-14 MED ORDER — EPHEDRINE 5 MG/ML INJ
INTRAVENOUS | Status: AC
Start: 2017-09-14 — End: 2017-09-14
  Filled 2017-09-14: qty 10

## 2017-09-14 MED ORDER — SUGAMMADEX SODIUM 200 MG/2ML IV SOLN
INTRAVENOUS | Status: AC
  Filled 2017-09-14: qty 2

## 2017-09-14 MED ORDER — LIDOCAINE HCL (CARDIAC) 20 MG/ML IV SOLN
INTRAVENOUS | Status: AC
  Filled 2017-09-14: qty 5

## 2017-09-14 MED ORDER — CEFAZOLIN SODIUM-DEXTROSE 2-4 GM/100ML-% IV SOLN
2.0000 g | Freq: Three times a day (TID) | INTRAVENOUS | Status: AC
  Administered 2017-09-14 – 2017-09-15 (×2): 2 g via INTRAVENOUS
  Filled 2017-09-14 (×2): qty 100

## 2017-09-14 MED ORDER — OXYCODONE HCL 5 MG/5ML PO SOLN: 5.0000 mg | Freq: Once | ORAL | Status: AC | PRN

## 2017-09-14 MED ORDER — ONDANSETRON HCL 4 MG/2ML IJ SOLN
4.0000 mg | Freq: Four times a day (QID) | INTRAMUSCULAR | Status: DC | PRN
Start: 2017-09-14 — End: 2017-09-15

## 2017-09-14 MED ORDER — SODIUM CHLORIDE 0.9% FLUSH
3.0000 mL | INTRAVENOUS | Status: DC | PRN
  Administered 2017-09-15: 3 mL via INTRAVENOUS
  Filled 2017-09-14: qty 3

## 2017-09-14 MED ORDER — PANTOPRAZOLE SODIUM 40 MG IV SOLR
40.0000 mg | Freq: Every day | INTRAVENOUS | Status: DC
  Administered 2017-09-14: 40 mg via INTRAVENOUS
  Filled 2017-09-14: qty 40

## 2017-09-14 MED ORDER — ALPRAZOLAM 0.5 MG PO TABS
0.5000 mg | ORAL_TABLET | Freq: Every evening | ORAL | Status: DC | PRN
  Administered 2017-09-14 – 2017-09-15 (×2): 0.5 mg via ORAL
  Filled 2017-09-14 (×2): qty 1

## 2017-09-14 MED ORDER — ACETAMINOPHEN 650 MG RE SUPP: 650.0000 mg | RECTAL | Status: DC | PRN

## 2017-09-14 MED ORDER — THROMBIN 5000 UNITS EX SOLR
CUTANEOUS | Status: AC
  Filled 2017-09-14: qty 5000

## 2017-09-14 MED ORDER — FENTANYL CITRATE (PF) 250 MCG/5ML IJ SOLN
INTRAMUSCULAR | Status: DC | PRN
  Administered 2017-09-14 (×2): 50 ug via INTRAVENOUS
  Administered 2017-09-14: 150 ug via INTRAVENOUS
  Administered 2017-09-14: 50 ug via INTRAVENOUS

## 2017-09-14 MED ORDER — LIDOCAINE 2% (20 MG/ML) 5 ML SYRINGE
INTRAMUSCULAR | Status: DC | PRN
  Administered 2017-09-14: 60 mg via INTRAVENOUS

## 2017-09-14 MED ORDER — DEXMEDETOMIDINE HCL IN NACL 400 MCG/100ML IV SOLN
INTRAVENOUS | Status: DC | PRN
Start: 2017-09-14 — End: 2017-09-14
  Administered 2017-09-14: 0.7 ug/kg/h via INTRAVENOUS

## 2017-09-14 MED ORDER — OXYCODONE HCL 5 MG PO TABS
5.0000 mg | ORAL_TABLET | Freq: Once | ORAL | Status: AC | PRN
  Administered 2017-09-14: 5 mg via ORAL

## 2017-09-14 MED ORDER — ACETAMINOPHEN 325 MG PO TABS: 650.0000 mg | ORAL_TABLET | ORAL | Status: DC | PRN

## 2017-09-14 MED ORDER — THROMBIN (RECOMBINANT) 5000 UNITS EX SOLR
OROMUCOSAL | Status: DC | PRN
  Administered 2017-09-14 (×2): via TOPICAL

## 2017-09-14 MED ORDER — OXYCODONE HCL 5 MG PO TABS
10.0000 mg | ORAL_TABLET | ORAL | Status: DC | PRN
  Administered 2017-09-14 – 2017-09-15 (×2): 10 mg via ORAL
  Filled 2017-09-14 (×2): qty 2

## 2017-09-14 MED ORDER — PHENYLEPHRINE 40 MCG/ML (10ML) SYRINGE FOR IV PUSH (FOR BLOOD PRESSURE SUPPORT)
PREFILLED_SYRINGE | INTRAVENOUS | Status: AC
  Filled 2017-09-14: qty 10

## 2017-09-14 MED ORDER — ALUM & MAG HYDROXIDE-SIMETH 200-200-20 MG/5ML PO SUSP: 30.0000 mL | Freq: Four times a day (QID) | ORAL | Status: DC | PRN

## 2017-09-14 MED ORDER — ACETAMINOPHEN 325 MG PO TABS: 325.0000 mg | ORAL_TABLET | ORAL | Status: DC | PRN

## 2017-09-14 SURGICAL SUPPLY — 60 items
ADH SKN CLS APL DERMABOND .7 (GAUZE/BANDAGES/DRESSINGS) ×1
APL SKNCLS STERI-STRIP NONHPOA (GAUZE/BANDAGES/DRESSINGS) ×1
BAG DECANTER FOR FLEXI CONT (MISCELLANEOUS) ×2 IMPLANT
BENZOIN TINCTURE PRP APPL 2/3 (GAUZE/BANDAGES/DRESSINGS) ×2 IMPLANT
BIT DRILL SPINE QC 14 (BIT) ×1 IMPLANT
BUR MATCHSTICK NEURO 3.0 LAGG (BURR) ×2 IMPLANT
CANISTER SUCT 3000ML PPV (MISCELLANEOUS) ×2 IMPLANT
CARTRIDGE OIL MAESTRO DRILL (MISCELLANEOUS) ×1 IMPLANT
DERMABOND ADVANCED (GAUZE/BANDAGES/DRESSINGS) ×1
DERMABOND ADVANCED .7 DNX12 (GAUZE/BANDAGES/DRESSINGS) ×1 IMPLANT
DIFFUSER DRILL AIR PNEUMATIC (MISCELLANEOUS) ×2 IMPLANT
DRAPE C-ARM 42X72 X-RAY (DRAPES) ×4 IMPLANT
DRAPE LAPAROTOMY 100X72 PEDS (DRAPES) ×2 IMPLANT
DRAPE MICROSCOPE LEICA (MISCELLANEOUS) ×2 IMPLANT
DRSG OPSITE POSTOP 4X6 (GAUZE/BANDAGES/DRESSINGS) ×1 IMPLANT
DURAPREP 6ML APPLICATOR 50/CS (WOUND CARE) ×2 IMPLANT
ELECT COATED BLADE 2.86 ST (ELECTRODE) ×2 IMPLANT
ELECT REM PT RETURN 9FT ADLT (ELECTROSURGICAL) ×2
ELECTRODE REM PT RTRN 9FT ADLT (ELECTROSURGICAL) ×1 IMPLANT
GAUZE SPONGE 4X4 12PLY STRL (GAUZE/BANDAGES/DRESSINGS) ×2 IMPLANT
GAUZE SPONGE 4X4 16PLY XRAY LF (GAUZE/BANDAGES/DRESSINGS) IMPLANT
GLOVE BIO SURGEON STRL SZ7 (GLOVE) IMPLANT
GLOVE BIO SURGEON STRL SZ8 (GLOVE) ×2 IMPLANT
GLOVE BIOGEL PI IND STRL 7.0 (GLOVE) IMPLANT
GLOVE BIOGEL PI INDICATOR 7.0 (GLOVE)
GLOVE EXAM NITRILE LRG STRL (GLOVE) IMPLANT
GLOVE EXAM NITRILE XL STR (GLOVE) IMPLANT
GLOVE EXAM NITRILE XS STR PU (GLOVE) IMPLANT
GLOVE INDICATOR 8.5 STRL (GLOVE) ×2 IMPLANT
GOWN STRL REUS W/ TWL LRG LVL3 (GOWN DISPOSABLE) IMPLANT
GOWN STRL REUS W/ TWL XL LVL3 (GOWN DISPOSABLE) ×1 IMPLANT
GOWN STRL REUS W/TWL 2XL LVL3 (GOWN DISPOSABLE) IMPLANT
GOWN STRL REUS W/TWL LRG LVL3 (GOWN DISPOSABLE)
GOWN STRL REUS W/TWL XL LVL3 (GOWN DISPOSABLE) ×2
HALTER HD/CHIN CERV TRACTION D (MISCELLANEOUS) ×2 IMPLANT
HEMOSTAT POWDER KIT SURGIFOAM (HEMOSTASIS) ×4 IMPLANT
KIT BASIN OR (CUSTOM PROCEDURE TRAY) ×2 IMPLANT
KIT ROOM TURNOVER OR (KITS) ×2 IMPLANT
NDL SPNL 20GX3.5 QUINCKE YW (NEEDLE) ×1 IMPLANT
NEEDLE SPNL 20GX3.5 QUINCKE YW (NEEDLE) ×2 IMPLANT
NS IRRIG 1000ML POUR BTL (IV SOLUTION) ×2 IMPLANT
OIL CARTRIDGE MAESTRO DRILL (MISCELLANEOUS) ×2
PACK LAMINECTOMY NEURO (CUSTOM PROCEDURE TRAY) ×2 IMPLANT
PAD ARMBOARD 7.5X6 YLW CONV (MISCELLANEOUS) ×6 IMPLANT
PIN DISTRACTION 14MM (PIN) ×2 IMPLANT
PLATE ANT CERV XTEND 2 LV 32 (Plate) ×2 IMPLANT
PUTTY DBM ALLOFUSE 2.5CC (Bone Implant) ×2 IMPLANT
RUBBERBAND STERILE (MISCELLANEOUS) ×4 IMPLANT
SCREW XTD VAR 4.2 SELF TAP (Screw) ×6 IMPLANT
SPACER COLONIAL ACDF TPS 8 LG (Spacer) ×4 IMPLANT
SPONGE INTESTINAL PEANUT (DISPOSABLE) ×2 IMPLANT
SPONGE SURGIFOAM ABS GEL SZ50 (HEMOSTASIS) ×1 IMPLANT
STRIP CLOSURE SKIN 1/2X4 (GAUZE/BANDAGES/DRESSINGS) ×2 IMPLANT
SUT VIC AB 3-0 SH 8-18 (SUTURE) ×3 IMPLANT
SUT VICRYL 4-0 PS2 18IN ABS (SUTURE) ×2 IMPLANT
TAPE CLOTH 4X10 WHT NS (GAUZE/BANDAGES/DRESSINGS) IMPLANT
TOWEL GREEN STERILE (TOWEL DISPOSABLE) ×2 IMPLANT
TOWEL GREEN STERILE FF (TOWEL DISPOSABLE) ×2 IMPLANT
TRAP SPECIMEN MUCOUS 40CC (MISCELLANEOUS) ×2 IMPLANT
WATER STERILE IRR 1000ML POUR (IV SOLUTION) ×2 IMPLANT

## 2017-09-14 NOTE — Progress Notes (Signed)
Received via stretcher from PACU, gait steady with 1 assist, voided, taking po's well.  Pain level #8 medicated per order.  Oriented to room, safety precautions, plan of care & verbalized understanding.

## 2017-09-14 NOTE — H&P (Signed)
Justan Gaede Sr. is an 57 y.o. male.   Chief Complaint: numbness tingling weakness in his arms HPI: 57 year old gentleman presented to the ER couple days ago with numbness tingling weakness in his hands has been stable for 3 weeks of progressive over 6 months. Workup revealed severe cord compression secondary to score clinical exam was consistent with myelopathy. Patient presents now for ACDF C4-5 C5-6. I've extensively gone over the risks and benefits of the operation with the patient as well as perioperative course expectations of outcome and alternatives of surgery and he understands and agrees to proceed forward.  Past Medical History:  Diagnosis Date  . Anxiety   . Cough   . Depression   . Dyspnea     Past Surgical History:  Procedure Laterality Date  . NO PAST SURGERIES      History reviewed. No pertinent family history. Social History:  reports that he has been smoking.  He has a 84.00 pack-year smoking history. He has never used smokeless tobacco. He reports that he drinks alcohol. He reports that he does not use drugs.  Allergies: No Known Allergies  Medications Prior to Admission  Medication Sig Dispense Refill  . ALPRAZolam (XANAX) 0.5 MG tablet Take 0.5 mg by mouth at bedtime as needed for anxiety.    Marland Kitchen ibuprofen (ADVIL,MOTRIN) 200 MG tablet Take 400 mg by mouth every 6 (six) hours as needed for moderate pain.    . meloxicam (MOBIC) 15 MG tablet Take 1 tablet (15 mg total) by mouth daily. Take 1 daily with food. 10 tablet 0  . predniSONE (STERAPRED UNI-PAK 21 TAB) 10 MG (21) TBPK tablet Use as directed (Patient taking differently: Use as directed day3 (09/12/2017)) 21 tablet 0    Results for orders placed or performed during the hospital encounter of 09/14/17 (from the past 48 hour(s))  Type and screen [place]     Status: None   Collection Time: 09/14/17  1:41 PM  Result Value Ref Range   ABO/RH(D) A POS    Antibody Screen NEG    Sample Expiration       09/17/2017 Performed at [facility], [place] [address] [place], [place] [address]    No results found.  Review of Systems  Musculoskeletal: Positive for joint pain, myalgias and neck pain.  Neurological: Positive for tingling and sensory change.    Blood pressure (!) 177/92, pulse 80, temperature 98 F (36.7 C), temperature source Oral, resp. rate 18, height 6' (1.829 m), weight 86.2 kg (190 lb), SpO2 99 %. Physical Exam  Constitutional: He is oriented to person, place, and time. He appears well-developed.  HENT:  Head: Normocephalic.  Eyes: Pupils are equal, round, and reactive to light.  Neck: Normal range of motion.  Respiratory: Effort normal.  GI: Soft.  Neurological: He is alert and oriented to person, place, and time. He has normal strength. GCS eye subscore is 4. GCS verbal subscore is 5. GCS motor subscore is 6.  Strength is 5 of 5 deltoid, bicep, wrist flexion, wrist extensor, hand intrinsics weakness and triceps bilaterally. 4+ out of 5 does have slight contracture of his right hand decreased sensation in both hands. Lower extremity strength out of 5 but he is hyperreflexic with clonus right greater than left  Skin: Skin is warm and dry.     Assessment/Plan A 57 year old presents for ACDF C4-5 C5-6  Brittannie Tawney P, MD 09/14/2017, 3:14 PM

## 2017-09-14 NOTE — Anesthesia Procedure Notes (Signed)
Procedure Name: Intubation Date/Time: 09/14/2017 4:15 PM Performed by: Julieta Bellini, CRNA Pre-anesthesia Checklist: Patient identified, Emergency Drugs available, Suction available and Patient being monitored Patient Re-evaluated:Patient Re-evaluated prior to induction Oxygen Delivery Method: Circle system utilized Preoxygenation: Pre-oxygenation with 100% oxygen Induction Type: IV induction Ventilation: Mask ventilation with difficulty and Two handed mask ventilation required Laryngoscope Size: Mac and 4 Grade View: Grade II Tube type: Oral Tube size: 7.5 mm Number of attempts: 1 Airway Equipment and Method: Stylet Placement Confirmation: ETT inserted through vocal cords under direct vision,  positive ETCO2 and breath sounds checked- equal and bilateral Secured at: 23 cm Tube secured with: Tape Dental Injury: Teeth and Oropharynx as per pre-operative assessment

## 2017-09-14 NOTE — Transfer of Care (Signed)
Immediate Anesthesia Transfer of Care Note  Patient: [name] Slade Sr.  Procedure(s) Performed: ANTERIOR CERVICAL DECOMPRESSION/DISCECTOMY FUSIONCERVICAL 4- CERVICAL 5, CERVICAL 5- CERVICAL 6 (N/A )  Patient Location: PACU  Anesthesia Type:General  Level of Consciousness: drowsy and responds to stimulation  Airway & Oxygen Therapy: Patient Spontanous Breathing  Post-op Assessment: Report given to RN and Post -op Vital signs reviewed and stable  Post vital signs: Reviewed and stable  Last Vitals:  Vitals Value Taken Time  BP 127/74 09/14/2017  5:58 PM  Temp    Pulse 65 09/14/2017  6:01 PM  Resp 15 09/14/2017  6:01 PM  SpO2 95 % 09/14/2017  6:01 PM  Vitals shown include unvalidated device data.  Last Pain:  Vitals:   09/14/17 1319  TempSrc:   PainSc: 8       Patients Stated Pain Goal: 5 (53/20/23 3435)  Complications: No apparent anesthesia complications

## 2017-09-14 NOTE — Anesthesia Preprocedure Evaluation (Signed)
Anesthesia Evaluation  Patient identified by MRN, date of birth, ID band Patient awake    Reviewed: Allergy & Precautions, NPO status , Patient's Chart, lab work & pertinent test results  History of Anesthesia Complications Negative for: history of anesthetic complications  Airway Mallampati: II  TM Distance: >3 FB Neck ROM: Full    Dental  (+) Edentulous Upper, Edentulous Lower   Pulmonary shortness of breath, Current Smoker,    breath sounds clear to auscultation       Cardiovascular negative cardio ROS   Rhythm:Regular     Neuro/Psych PSYCHIATRIC DISORDERS Anxiety Depression    GI/Hepatic negative GI ROS, (+)     substance abuse  alcohol use,   Endo/Other  negative endocrine ROS  Renal/GU negative Renal ROS     Musculoskeletal   Abdominal   Peds  Hematology negative hematology ROS (+)   Anesthesia Other Findings   Reproductive/Obstetrics                             Anesthesia Physical Anesthesia Plan  ASA: III  Anesthesia Plan: General   Post-op Pain Management:    Induction: Intravenous  PONV Risk Score and Plan: 1 and Ondansetron and Dexamethasone  Airway Management Planned: Oral ETT  Additional Equipment: None  Intra-op Plan:   Post-operative Plan: Extubation in OR  Informed Consent: I have reviewed the patients History and Physical, chart, labs and discussed the procedure including the risks, benefits and alternatives for the proposed anesthesia with the patient or authorized representative who has indicated his/her understanding and acceptance.   Dental advisory given  Plan Discussed with: CRNA and Surgeon  Anesthesia Plan Comments:         Anesthesia Quick Evaluation

## 2017-09-14 NOTE — Op Note (Signed)
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 #2 anterior cervical plating with globus extend plating system  Surgeon: Dominica Severin Jordane Hisle  Asst.: Sherley Bounds  Anesthesia: Gen.  EBL: Minimal  History of present illness: Patient is a very pleasant 57 year old gentleman who's had months of progressive numbness tingling weakness in his hands and difficult to walk and workup revealed severe cord compression signal changes cord from cervical stenosis at C4-5 and C5-6. Due to patient's progression of clinical syndrome imaging findings a conservative treatment I recommended anterior cervical discectomies and fusion at those levels. I extensively went over the risks and benefits of the procedure the patient as well as perioperative course expectations of outcome and alternatives of surgery and he understands and agrees to proceed forward.  Operative procedure: Patient brought into the or was induced on general seizure positioned supine the neck in slight extension in 5 pounds of halter traction the right side of his neck was prepped and draped in routine sterile fashion then a preoperative x-ray localize the appropriate level so a curvilinear incision was made just off midline to the interbody the sternomastoid the super slide platysmas dissected and divided longitudinally the avascular plane to sternomastoid and strap as was developed down to the free fashion. The prevertebral fascia was then dissected away with Kitners. Interoperative x-ray confirmed with desiccation appropriate level so annulotomy's were made with a 15 blade scalpel marked the disc space longus close reflected laterally and self-retaining retractors were placed. Then anterior  aspects were bitten off of tooth number Kerrison punch disc space were cleaned out with Epstein curettes then both the space were drilled down with a high-speed drill capturing the bone shavings in a mucous trap. Under microscopic illumination first working at C4-5 identify the posterior annulus and posterior complex as well as posterior last liquid. This is all removed there was several large free fragments disc it migrated subligamentous causing severe cord compression. There was also an adherent semi-adherent inflammatory membrane that was removed decompress the central canal aggressively under biting both endplates decompressed the central canal and both C5 pedicles were identified both C5 nerve roots decompressed. This was then packed with Gelfoam and attention taken to C5-6. C5-6 was drilled down a similar fashion capturing the bone shavings in a mucous trap large artifacts were bitten off the inferior aspect of the C5 vertebral body was a go see her get meticulous in space was maintained after adequate decompression achieved at both levels to 8 mm parallel cages were packed with locally harvested autograft mixed with putty and inserted. Then a globus extend plate was selected all screws excellent purchase locking mechanism was gauged some additional putty and bone was packed along the inferior aspect of the plate besides and to the halls. Then after meticulous he states was made and the wounds closed in layers with after Vicryl skin screws were enforced subcuticular Dermabond benzo and Steri-Strips and sterile dressing was applied and patient recovered in stable condition. At the end the case all needle counts sponge counts were correct.

## 2017-09-15 ENCOUNTER — Encounter (HOSPITAL_COMMUNITY): Payer: Self-pay | Admitting: Neurosurgery

## 2017-09-15 MED ORDER — PANTOPRAZOLE SODIUM 40 MG PO TBEC: 40.0000 mg | DELAYED_RELEASE_TABLET | Freq: Every day | ORAL | Status: DC

## 2017-09-15 MED ORDER — OXYCODONE HCL 10 MG PO TABS: 10.0000 mg | ORAL_TABLET | Freq: Four times a day (QID) | ORAL | 0 refills | Status: DC | PRN

## 2017-09-15 MED ORDER — CYCLOBENZAPRINE HCL 10 MG PO TABS: 10.0000 mg | ORAL_TABLET | Freq: Three times a day (TID) | ORAL | 1 refills | Status: DC | PRN

## 2017-09-15 MED FILL — Thrombin For Soln 5000 Unit: CUTANEOUS | Qty: 5000 | Status: AC

## 2017-09-15 MED FILL — Gelatin Absorbable MT Powder: OROMUCOSAL | Qty: 1 | Status: AC

## 2017-09-15 NOTE — Progress Notes (Signed)
09/15/17 1220  PT Visit Information  Last PT Received On 09/15/17  Assistance Needed +1  History of Present Illness 57 yo s/p ACDF due to cervical myelopathy. PMH includes anxiety and depression.   Precautions  Precautions Fall;Cervical  Precaution Booklet Issued Yes (comment)  Precaution Comments Pt able to recall most precautions from OT session, however, required continuous cues to maintain throughout session; especially not to turn head.   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 difficulties  Pain Assessment  Pain Assessment 0-10  Pain Score 8  Pain Location neck  Pain Descriptors / Indicators Aching;Discomfort  Pain Intervention(s) Limited activity within patient's tolerance;Monitored during session;Repositioned  Cognition  Arousal/Alertness Awake/alert  Behavior During Therapy WFL for tasks assessed/performed  Overall Cognitive Status Within Functional Limits for tasks assessed  Upper Extremity Assessment  Upper Extremity Assessment Defer to OT evaluation  Lower Extremity Assessment  Lower Extremity Assessment Generalized weakness (LLE>RLE )  Cervical / Trunk Assessment  Cervical / Trunk Assessment Other exceptions  Cervical / Trunk Exceptions s/p ACDF   Bed Mobility  General bed mobility comments Standing in bathroom upon entry.   Transfers  Overall transfer level Needs  assistance  Equipment used Straight cane  Transfers Sit to/from Stand  Sit to Stand Min guard  General transfer comment Unsteady upon standing requiring min guard assist for steadying.   Ambulation/Gait  Ambulation/Gait assistance Min guard  Ambulation Distance (Feet) 200 Feet  Assistive device Straight cane  Gait Pattern/deviations Step-through pattern;Decreased stride length  General Gait Details Slow, unsteady gait requiring min guard for steadying assist. Educated about technique to use cane and pt with improved steadiness. Required occasional cues for sequencing with cane. Educated about generalized walking program to perform at home.   Gait velocity Decreased   Gait velocity interpretation Below normal speed for age/gender  Stairs Yes  Stairs assistance Min guard;Min assist  Stair Management With cane;Forwards;Step to pattern  Number of Stairs 3 (X2)  General stair comments Performed stair training X 2. During first attempt, pt requiring max verbal cues for technique and min A for steadying. During second attempt, pt with improved technique and required little to no cueing, able to teach back technique, and required min guard for steadying. Educated pt's girlfriend about technique as well.   Balance  Overall balance assessment Needs assistance  Sitting-balance support No upper extremity supported;Feet supported  Sitting balance-Leahy Scale Good  Standing balance support Single extremity supported;No upper extremity supported  Standing balance-Leahy Scale Fair  Standing balance comment Able to maintain static standing without UE support.   General Comments  General comments (skin integrity, edema, etc.) Pt's girlfriend in the room during session.   PT - End of Session  Equipment Utilized During Treatment Gait belt  Activity Tolerance Patient tolerated treatment well  Patient left in bed;with call bell/phone within reach;with family/visitor present  Nurse Communication Mobility  status  PT Assessment  PT Recommendation/Assessment Patient needs continued PT services  PT Visit  Diagnosis Unsteadiness on feet (R26.81);Other abnormalities of gait and mobility (R26.89);Pain  Pain - part of body  (neck )  PT Problem List Decreased strength;Decreased balance;Decreased mobility;Decreased knowledge of use of DME;Decreased safety awareness;Decreased knowledge of precautions;Pain  PT Plan  PT Frequency (ACUTE ONLY) Min 5X/week  PT Treatment/Interventions (ACUTE ONLY) DME instruction;Gait training;Stair training;Functional mobility training;Therapeutic activities;Balance training;Therapeutic exercise;Neuromuscular re-education;Patient/family education  AM-PAC PT "6 Clicks" Daily Activity Outcome Measure  Difficulty turning over in bed (including adjusting bedclothes, sheets and blankets)? 3  Difficulty moving from lying on back to sitting on the side of the bed?  3  Difficulty sitting down on and standing up from a chair with arms (e.g., wheelchair, bedside commode, etc,.)? 1  Help needed moving to and from a bed to chair (including a wheelchair)? 3  Help needed walking in hospital room? 3  Help needed climbing 3-5 steps with a railing?  3  6 Click Score 16  Mobility G Code  CK  PT Recommendation  Follow Up Recommendations Supervision for mobility/OOB;Other (comment) (would benefit from outpatient PT once cleared by MD)  PT equipment Cane  Individuals Consulted  Consulted and Agree with Results and Recommendations Patient;Family member/caregiver  Family Member Consulted girlfriend   Acute Rehab PT Goals  Patient Stated Goal get his strength back  PT Goal Formulation With patient  Time For Goal Achievement 09/29/17  Potential to Achieve Goals Good  PT Time Calculation  PT Start Time (ACUTE ONLY) 1047  PT Stop Time (ACUTE ONLY) 1110  PT Time Calculation (min) (ACUTE ONLY) 23 min  PT General Charges  $$ ACUTE PT VISIT 1 Visit  PT Evaluation  $PT Eval Low Complexity 1  Low  PT Treatments  $Gait Training 8-22 mins  Written Expression  Dominant Hand Right   Patient is s/p above surgery resulting in the deficits listed below (see PT Problem List). Pt presenting with unsteadiness during gait and decreased awareness of cervical precautions. Required continuous cues throughout to maintain precautions. Practiced gait and stair training with cane and pt reports increased steadiness. Educated about precautions and generalized walking program. Patient will benefit from skilled PT to increase their independence and safety with mobility (while adhering to their precautions) to allow discharge to the venue listed below.  Leighton Ruff, PT, DPT  Acute Rehabilitation Services  Pager: [phone]

## 2017-09-15 NOTE — Progress Notes (Addendum)
Occupational Therapy Evaluation Patient Details Name: [name] Rollo Sr. 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 Pt staes he has had more difficulty with self care and walking over the last 2 months. Pt with generalized LUE weakness, stronger proximally. Pt seen for an additional visit to establish a HEP and complete ADL education. Feel pt will benefit from neuro outpt OT after cleared by Dr. Saintclair Halsted to increase functional use of LUE and facilitate possible return to work. Pt appreciative.     Follow Up Recommendations  Supervision - Intermittent;Outpatient OT(after cleared by Dr. Saintclair Halsted)    Equipment Recommendations  None recommended by OT    Recommendations for Other Services PT consult     Precautions / Restrictions Precautions Precautions: Fall;Cervical Precaution Booklet Issued: Yes (comment)      Mobility Bed Mobility Overal bed mobility: Needs Assistance Bed Mobility: Sidelying to Sit   Sidelying to sit: Supervision       General bed mobility comments: Educated on log rolling technique; mod vc to adhere  Transfers Overall transfer level: Needs assistance   Transfers: Sit to/from [employer] Transfers Sit to Stand: Supervision Stand pivot transfers: Min guard       General transfer comment: unsteady initially with transfers; Pt states he has been unsteady for 1-2 months    Balance Overall balance assessment: Needs assistance   Sitting balance-Leahy Scale: Good       Standing balance-Leahy Scale: Fair                             ADL either performed or assessed with clinical judgement   ADL Overall ADL's : Needs assistance/impaired Eating/Feeding: Modified independent   Grooming: Set  up;Supervision/safety;Standing Grooming Details (indicate cue type and reason): vc for cervical precautions Upper Body Bathing: Set up;Sitting   Lower Body Bathing: Supervison/ safety;Set up;Sit to/from stand   Upper Body Dressing : Set up;Sitting;Supervision/safety   Lower Body Dressing: Set up;Supervision/safety;Sit to/from stand   Toilet Transfer: Designer, fashion/clothing and Hygiene: Modified independent       Functional mobility during ADLs: Min guard General ADL Comments: Completed education regarding home safety adn preventing falls; Educated on proper set up of home to increase adherance to cervical precautions; pt requires multiple cues to adhere to precuations   Issued elastic shoestrings; reacher to assist with ADL and home management     Vision         Perception     Praxis      Pertinent Vitals/Pain Pain Assessment: 0-10 Pain Score: 5  Pain Location: neck Pain Descriptors / Indicators: Aching;Discomfort Pain Intervention(s): Limited activity within patient's tolerance     Hand Dominance Right   Extremity/Trunk Assessment Upper Extremity Assessment Upper Extremity Assessment: LUE deficits/detail LUE Deficits / Details: LUE general weakness; greater wekness distally; apparent atrophy around L shoulder girdle; ablet o make gross grasp, unable to fully extend fingers and demonstrates "claw hand". Able to achieve full PROM in extension; able to oppose thumb to index and middle fingers; poor in-hand manipulation skills and gfine motor skills; unable to pick up coins/small objects; "numb" - drops items frequently LUE Sensation: decreased light touch;decreased proprioception LUE Coordination: decreased fine motor;decreased gross motor   Lower  Extremity Assessment Lower Extremity Assessment: Defer to PT evaluation   Cervical / Trunk Assessment Cervical / Trunk Assessment: Other exceptions(s/p ACDF)   Communication  Communication Communication: No difficulties   Cognition Arousal/Alertness: Awake/alert Behavior During Therapy: WFL for tasks assessed/performed Overall Cognitive Status: Within Functional Limits for tasks assessed                                     General Comments       Exercises Exercises: Other exercises;Hand exercises Hand Exercises Digit Composite Flexion: AROM;Left;10 reps;Squeeze ball Composite Extension: AROM;AAROM;10 reps;Seated Digit Composite Abduction: AROM;10 reps;Seated Digit Composite Adduction: AROM;Seated;10 reps Digit Lifts: AROM;Left;10 reps;Seated Other Exercises Other Exercises: theraputty level 1 grip and pinch strengthening; handout provided Other Exercises: squzze ball for grip and pinch strengthening adn coordination Other Exercises: fine motor control and coordination tasks   Shoulder Instructions      Home Living Family/patient expects to be discharged to:: Private residence Living Arrangements: Spouse/significant other Available Help at Discharge: Family;Available 24 hours/day Type of Home: House Home Access: Stairs to enter [employer] of Steps: 1 Entrance Stairs-Rails: None Home Layout: One level     Bathroom Shower/Tub: Corporate investment banker: Standard Bathroom Accessibility: Yes How Accessible: Accessible via walker Home Equipment: None          Prior Functioning/Environment Level of Independence: Independent        Comments: worked in Architect; drove        OT Problem List: Decreased strength;Decreased range of motion;Impaired balance (sitting and/or standing);Decreased coordination;Decreased safety awareness;Decreased knowledge of use of DME or AE;Decreased knowledge of precautions;Impaired sensation;Impaired tone;Impaired UE functional use;Pain      OT Treatment/Interventions:      OT Goals(Current goals can be found in the care plan section) Acute Rehab OT  Goals Patient Stated Goal: get his strength back OT Goal Formulation: All assessment and education complete, DC therapy  OT Frequency:     Barriers to D/C:            Co-evaluation              AM-PAC PT "6 Clicks" Daily Activity     Outcome Measure Help from another person eating meals?: None Help from another person taking care of personal grooming?: A Little Help from another person toileting, which includes using toliet, bedpan, or urinal?: None Help from another person bathing (including washing, rinsing, drying)?: A Little Help from another person to put on and taking off regular upper body clothing?: None Help from another person to put on and taking off regular lower body clothing?: A Little 6 Click Score: 21   End of Session Equipment Utilized During Treatment: Gait belt Nurse Communication: Mobility status;Other (comment)(DC needs)  Activity Tolerance: Patient tolerated treatment well Patient left: in bed;with call bell/phone within reach;with family/visitor present  OT Visit Diagnosis: Unsteadiness on feet (R26.81);Muscle weakness (generalized) (M62.81);Pain Pain - part of body: (neck)                Time: 4580-9983 OT Time Calculation (min): 20 min  Time:0910 - 0937 27 min Charges:  OT General Charges $OT Visit: (2 visits) OT Evaluation $OT Eval Moderate Complexity: 1 Mod OT Treatments $Self Care/Home Management : 8-22 mins $Therapeutic Activity: 8-22 mins G-Codes:     [facility], OT/L  [phone] 09/15/2017  Denise Washburn,HILLARY 09/15/2017, 10:43 AM

## 2017-09-15 NOTE — Care Management Note (Signed)
Case Management Note  Patient Details  Name: [name] [name]. MRN: [id number] Date of Birth: [birth year]  Subjective/Objective:  Pt s/p cervical surgery. He is from home with girlfriend.                   Action/Plan: Pt discharging home with self care. PT/OT recommend supervision for ambulation. Girlfriend able to provide supervision.  Pt with orders for a cane. Jermaine with [facility] notified and will deliver to the room. Girlfriend to provide transportation home.    Expected Discharge Date:  09/15/17               Expected Discharge Plan:  Home/Self Care  In-House Referral:     Discharge planning Services  CM Consult  Post Acute Care Choice:  Durable Medical Equipment Choice offered to:  Patient  DME Arranged:  Kasandra Knudsen DME Agency:  [place]:    [facility] Agency:     Status of Service:  Completed, signed off  If discussed at [place] of Stay Meetings, dates discussed:    Additional Comments:  Pollie Friar, RN 09/15/2017, 12:40 PM

## 2017-09-15 NOTE — Progress Notes (Signed)
Patient discharged, iv D/C, pain meds given, Patient leaving by wheel chair with prescriptions and  Discharge paper work

## 2017-09-15 NOTE — Discharge Summary (Signed)
Physician Discharge Summary  Patient ID: [name] Minasyan Sr. MRN: [id number] DOB/AGE: 07/20/1960 57 y.o.  Admit date: 09/14/2017 Discharge date: 09/15/2017  Admission Diagnoses: cervical spondylitic myelopathy    Discharge Diagnoses: same status post ACDF   Discharged Condition: stable  Hospital Course: The patient was admitted on 09/14/2017 and taken to the operating room where the patient underwent ACDF. The patient tolerated the procedure well and was taken to the recovery room and then to the floor in stable condition. The hospital course was routine. There were no complications. The wound remained clean dry and intact. Pt had appropriate neck soreness. No complaints of and arm pain or new N/T/W. The patient remained afebrile with stable vital signs, and tolerated a regular diet. The patient continued to increase activities, and pain was well controlled with oral pain medications.   Consults: None  Significant Diagnostic Studies:  Results for orders placed or performed during the hospital encounter of 09/14/17  Type and screen [place]  Result Value Ref Range   ABO/RH(D) A POS    Antibody Screen NEG    Sample Expiration      09/17/2017 Performed at [facility], [place] [address], [place], [place] [address]   ABO/Rh  Result Value Ref Range   ABO/RH(D)      A POS Performed at [facility] [address] [place], [place] [address]     Dg Cervical Spine 1 View  Result Date: 09/14/2017 CLINICAL DATA:  Anterior cervical decompression EXAM: DG CERVICAL SPINE - 1 VIEW; DG C-ARM 61-120 MIN COMPARISON:  MRI 09/09/2017 FINDINGS: Single low resolution intraoperative spot view of the cervical spine. Total fluoroscopy time was 3 seconds. Image obtained in lateral projection. There is anterior plate and screw fixation C4 through C6 with interbody device. : Intraoperative fluoroscopic assistance provided during cervical spine surgery. Electronically Signed    By: Donavan Foil M.D.   On: 09/14/2017 18:03   Dg Forearm Left  Result Date: 09/09/2017 CLINICAL DATA:  Rule out metallic foreign body prior to MRI. EXAM: LEFT FOREARM - 2 VIEW COMPARISON:  None. FINDINGS: No radiopaque foreign body. IV catheter about the distal forearm. No acute osseous abnormality. Cortical margins of the radius and ulna are intact. No focal soft tissue abnormality. IMPRESSION: No radiopaque foreign body in the forearm to preclude MRI imaging. Electronically Signed   By: Jeb Levering M.D.   On: 09/09/2017 21:32   Dg Forearm Right  Result Date: 09/09/2017 CLINICAL DATA:  Rule out metallic foreign body prior to MRI. EXAM: RIGHT FOREARM - 2 VIEW COMPARISON:  None. FINDINGS: No radiopaque foreign body. No acute osseous abnormalities. Cortical margins of the radius and ulna are intact. No focal soft tissue abnormality. IMPRESSION: No radiopaque foreign body in the right forearm to preclude MRI imaging. Electronically Signed   By: Jeb Levering M.D.   On: 09/09/2017 21:33   Ct Head Wo Contrast  Result Date: 09/09/2017 CLINICAL DATA:  numbness and tingling to the right arm and hand that started 4 months ago EXAM: CT HEAD WITHOUT CONTRAST CT CERVICAL SPINE WITHOUT CONTRAST TECHNIQUE: Multidetector CT imaging of the head and cervical spine was performed following the standard protocol without intravenous contrast. Multiplanar CT image reconstructions of the cervical spine were also generated. COMPARISON:  None. FINDINGS: CT HEAD FINDINGS Brain: No acute intracranial hemorrhage. No focal mass lesion. No CT evidence of acute infarction. No midline shift or mass effect. No hydrocephalus. Basilar cisterns are patent. Vascular: No  hyperdense vessel or unexpected calcification. Skull: Normal. Negative for fracture or focal lesion. Sinuses/Orbits: Paranasal sinuses and mastoid air cells are clear. Orbits are clear. Other: None. CT CERVICAL SPINE FINDINGS Alignment: Normal alignment of the  cervical vertebral bodies. Skull base and vertebrae: Normal craniocervical junction. No loss of vertebral body height or disc height. Normal facet articulation. No evidence of fracture. Soft tissues and spinal canal: No prevertebral soft tissue swelling. No perispinal or epidural hematoma. Disc levels: No significant neural foraminal narrowing. Central disc bulge at C4-C5 (image 56/9) indents the ventral thecal sac and contacts the spinal cord. Upper chest: Nodular density at the RIGHT lung apex partially calcified, Other: None IMPRESSION: 1. No acute intracranial findings. 2. Central disc bulge at C4-C5. 3. No significant neural foraminal narrowing identified. 4. Nodular density RIGHT lung apex could represent benign scarring. Recommend CT thorax without contrast to further evaluate. Electronically Signed   By: Suzy Bouchard M.D.   On: 09/09/2017 16:38   Ct Chest Wo Contrast  Result Date: 09/09/2017 CLINICAL DATA:  57 year old male with history of shortness of breath. EXAM: CT CHEST WITHOUT CONTRAST TECHNIQUE: Multidetector CT imaging of the chest was performed following the standard protocol without IV contrast. COMPARISON:  Chest CT 07/06/2005. FINDINGS: Cardiovascular: Heart size is normal. There is no significant pericardial fluid, thickening or pericardial calcification. There is aortic atherosclerosis, as well as atherosclerosis of the great vessels of the mediastinum and the coronary arteries, including calcified atherosclerotic plaque in the left main, left anterior descending, left circumflex and right coronary arteries. Mediastinum/Nodes: No pathologically enlarged mediastinal or hilar lymph nodes. Please note that accurate exclusion of hilar adenopathy is limited on noncontrast CT scans. Esophagus is unremarkable in appearance. No axillary lymphadenopathy. Lungs/Pleura: Bilateral apical nodular pleuroparenchymal thickening and architectural distortion, most compatible with areas of chronic post  infectious or inflammatory scarring. No acute consolidative airspace disease. No pleural effusions. Dependent areas of architectural distortion and volume loss in the lower lobes of lungs bilaterally may reflect areas of chronic scarring and/or atelectasis. No suspicious appearing pulmonary nodules or masses. Mild diffuse bronchial wall thickening with mild centrilobular and paraseptal emphysema. Upper Abdomen: Aortic atherosclerosis. Musculoskeletal: There are no aggressive appearing lytic or blastic lesions noted in the visualized portions of the skeleton. IMPRESSION: 1. No acute findings in the thorax to account for the patient's symptoms. 2. Mild diffuse bronchial wall thickening with mild centrilobular and paraseptal emphysema; imaging findings suggestive of underlying COPD. 3. Aortic atherosclerosis, in addition to left main and 3 vessel coronary artery disease. Please note that although the presence of coronary artery calcium documents the presence of coronary artery disease, the severity of this disease and any potential stenosis cannot be assessed on this non-gated CT examination. Assessment for potential risk factor modification, dietary therapy or pharmacologic therapy may be warranted, if clinically indicated. Aortic Atherosclerosis (ICD10-I70.0) and Emphysema (ICD10-J43.9). Electronically Signed   By: Vinnie Langton M.D.   On: 09/09/2017 18:35   Ct Cervical Spine Wo Contrast  Result Date: 09/09/2017 CLINICAL DATA:  numbness and tingling to the right arm and hand that started 4 months ago EXAM: CT HEAD WITHOUT CONTRAST CT CERVICAL SPINE WITHOUT CONTRAST TECHNIQUE: Multidetector CT imaging of the head and cervical spine was performed following the standard protocol without intravenous contrast. Multiplanar CT image reconstructions of the cervical spine were also generated. COMPARISON:  None. FINDINGS: CT HEAD FINDINGS Brain: No acute intracranial hemorrhage. No focal mass lesion. No CT evidence of  acute infarction. No midline shift or  mass effect. No hydrocephalus. Basilar cisterns are patent. Vascular: No hyperdense vessel or unexpected calcification. Skull: Normal. Negative for fracture or focal lesion. Sinuses/Orbits: Paranasal sinuses and mastoid air cells are clear. Orbits are clear. Other: None. CT CERVICAL SPINE FINDINGS Alignment: Normal alignment of the cervical vertebral bodies. Skull base and vertebrae: Normal craniocervical junction. No loss of vertebral body height or disc height. Normal facet articulation. No evidence of fracture. Soft tissues and spinal canal: No prevertebral soft tissue swelling. No perispinal or epidural hematoma. Disc levels: No significant neural foraminal narrowing. Central disc bulge at C4-C5 (image 56/9) indents the ventral thecal sac and contacts the spinal cord. Upper chest: Nodular density at the RIGHT lung apex partially calcified, Other: None IMPRESSION: 1. No acute intracranial findings. 2. Central disc bulge at C4-C5. 3. No significant neural foraminal narrowing identified. 4. Nodular density RIGHT lung apex could represent benign scarring. Recommend CT thorax without contrast to further evaluate. Electronically Signed   By: Suzy Bouchard M.D.   On: 09/09/2017 16:38   Mr Cervical Spine Wo Contrast  Result Date: 09/09/2017 CLINICAL DATA:  Bilateral upper extremity numbness. EXAM: MRI CERVICAL SPINE WITHOUT CONTRAST TECHNIQUE: Multiplanar, multisequence MR imaging of the cervical spine was performed. No intravenous contrast was administered. COMPARISON:  CT cervical spine 09/09/2017 FINDINGS: Alignment: Physiologic. Vertebrae: No fracture, evidence of discitis, or bone lesion. Cord: There is hyperintense T2-weighted signal within the spinal cord at the C4-5 level. Posterior Fossa, vertebral arteries, paraspinal tissues: Visualized posterior fossa is normal. Vertebral artery flow voids are preserved. No prevertebral soft tissue swelling. Disc levels: C1-C2:  Normal. C2-C3: Normal disc space and facets. No spinal canal or neuroforaminal stenosis. C3-C4: Small central disc protrusion, greatest in the right subarticular zone. No central spinal canal stenosis. Moderate right foraminal stenosis. C4-C5: Intermediate sized central disc extrusion with inferior migration to the C5 pedicle level. This causes severe spinal canal stenosis, narrowing the caliber of the central spinal canal to 2 mm. There is compression of the spinal cord with deformity and hyperintense T2-weighted signal. Severe bilateral neural foraminal stenosis C5-C6: Medium-sized right subarticular disc osteophyte complex with moderate narrowing of the right neural foramen. No spinal canal stenosis. C6-C7: Normal disc space and facets. No spinal canal or neuroforaminal stenosis. C7-T1: Normal disc space and facets. No spinal canal or neuroforaminal stenosis. IMPRESSION: 1. Cord compression at the C4-5 level secondary to intermediate sized central disc extrusion with inferior migration. There is hyperintense T2-weighted signal within the cord, likely indicating edema. 2. Severe bilateral C4-5 neural foraminal narrowing. 3. Moderate right neural foraminal stenosis at C3-4 and C5-6. Critical Value/emergent results were called by telephone at the time of interpretation on 09/09/2017 at 10:52 pm to Dr. Margarita Mail , who verbally acknowledged these results. Electronically Signed   By: Ulyses Jarred M.D.   On: 09/09/2017 22:59   Dg Hand 2 View Right  Result Date: 09/09/2017 CLINICAL DATA:  Evaluate for metallic foreign body prior to MRI. EXAM: RIGHT HAND - 2 VIEW COMPARISON:  None. FINDINGS: External sensor obscures the distal right third finger. Otherwise no radiopaque foreign body. No fracture, dislocation or suspicious focal osseous lesion. Mild osteoarthritis at the first carpometacarpal joint and at the second through fourth metacarpophalangeal joints. IMPRESSION: No radiopaque foreign body.  Mild  polyarticular osteoarthritis. Electronically Signed   By: Ilona Sorrel M.D.   On: 09/09/2017 21:32   Dg Hand 2 View Left  Result Date: 09/09/2017 CLINICAL DATA:  Previous injury with concern for metallic foreign body EXAM:  LEFT HAND - 2 VIEW COMPARISON:  None. FINDINGS: Frontal and lateral views were obtained. No metallic foreign body evident. Intravenous catheter present. No fracture or dislocation. Joint spaces appear normal. No erosive change. IMPRESSION: No fracture or dislocation. No evident metallic foreign body. No appreciable arthropathy. Electronically Signed   By: Lowella Grip III M.D.   On: 09/09/2017 21:31   Dg Humerus Left  Result Date: 09/09/2017 CLINICAL DATA:  Rule out metallic foreign body prior to MRI. EXAM: LEFT HUMERUS - 2+ VIEW COMPARISON:  None. FINDINGS: No radiopaque foreign body in the left humerus. There is no evidence of fracture or other focal bone lesions. Soft tissues are unremarkable. IMPRESSION: No radiopaque foreign body in the left humerus to preclude MRI imaging. Electronically Signed   By: Jeb Levering M.D.   On: 09/09/2017 21:34   Dg Humerus Right  Result Date: 09/09/2017 CLINICAL DATA:  Rule out metallic foreign body prior to MRI. EXAM: RIGHT HUMERUS - 2+ VIEW COMPARISON:  None. FINDINGS: No radiopaque foreign body in the right humerus/upper arm. There is no evidence of fracture or other focal bone lesions. Soft tissues are unremarkable. IMPRESSION: No radiopaque foreign body in the right humerus to preclude MRI imaging Electronically Signed   By: Jeb Levering M.D.   On: 09/09/2017 21:35   Dg C-arm 1-60 Min  Result Date: 09/14/2017 CLINICAL DATA:  Anterior cervical decompression EXAM: DG CERVICAL SPINE - 1 VIEW; DG C-ARM 61-120 MIN COMPARISON:  MRI 09/09/2017 FINDINGS: Single low resolution intraoperative spot view of the cervical spine. Total fluoroscopy time was 3 seconds. Image obtained in lateral projection. There is anterior plate and screw  fixation C4 through C6 with interbody device. : Intraoperative fluoroscopic assistance provided during cervical spine surgery. Electronically Signed   By: Donavan Foil M.D.   On: 09/14/2017 18:03    Antibiotics:  Anti-infectives (From admission, onward)   Start     Dose/Rate Route Frequency Ordered Stop   09/14/17 2200  ceFAZolin (ANCEF) IVPB 2g/100 mL premix     2 g 200 mL/hr over 30 Minutes Intravenous Every 8 hours 09/14/17 2035 09/15/17 0704   09/14/17 1620  bacitracin 50,000 Units in sodium chloride irrigation 0.9 % 500 mL irrigation  Status:  Discontinued       As needed 09/14/17 1620 09/14/17 1754      Discharge Exam: Blood pressure 139/83, pulse 61, temperature 97.6 F (36.4 C), temperature source Oral, resp. rate 18, height 6\' 2"  (1.88 m), weight 93 kg (205 lb), SpO2 94 %. Neurologic: Grossly normal with stable myelopathy and weakness and numbness in the hands Dressing dry  Discharge Medications:   Allergies as of 09/15/2017   No Known Allergies     Medication List    TAKE these medications   ALPRAZolam 0.5 MG tablet Commonly known as:  XANAX Take 0.5 mg by mouth at bedtime as needed for anxiety.   cyclobenzaprine 10 MG tablet Commonly known as:  FLEXERIL Take 1 tablet (10 mg total) by mouth 3 (three) times daily as needed for muscle spasms.   ibuprofen 200 MG tablet Commonly known as:  ADVIL,MOTRIN Take 400 mg by mouth every 6 (six) hours as needed for moderate pain.   meloxicam 15 MG tablet Commonly known as:  MOBIC Take 1 tablet (15 mg total) by mouth daily. Take 1 daily with food.   Oxycodone HCl 10 MG Tabs Take 1 tablet (10 mg total) by mouth every 6 (six) hours as needed for severe pain ((score 7  to 10)).   predniSONE 10 MG (21) Tbpk tablet Commonly known as:  STERAPRED UNI-PAK 21 TAB Use as directed What changed:  additional instructions       Disposition: home   Final Dx: ACDF  Discharge Instructions    Call MD for:  difficulty breathing,  headache or visual disturbances   Complete by:  As directed    Call MD for:  persistant nausea and vomiting   Complete by:  As directed    Call MD for:  redness, tenderness, or signs of infection (pain, swelling, redness, odor or green/yellow discharge around incision site)   Complete by:  As directed    Call MD for:  severe uncontrolled pain   Complete by:  As directed    Call MD for:  temperature >100.4   Complete by:  As directed    Diet - low sodium heart healthy   Complete by:  As directed    Increase activity slowly   Complete by:  As directed          Signed: JONES,DAVID S 09/15/2017, 8:18 AM

## 2017-09-22 ENCOUNTER — Telehealth: Payer: Self-pay

## 2017-09-22 NOTE — Telephone Encounter (Signed)
Call received from the patient requesting a hospital follow up appointment.  An appointment was scheduled for 09/29/17 @ 1400. Also discussed the need to pick up an application for the [employer] when he comes to the office and schedule an appointment with the [facility].  He said that he is still working on the [employer] application. This CM also informed him that [facility] has a SW on site and he was very appreciative of the services available.   Update provided to Laurena Slimmer, RN CM

## 2017-09-28 NOTE — Anesthesia Postprocedure Evaluation (Signed)
Anesthesia Post Note  Patient: [name] Slade Sr.  Procedure(s) Performed: ANTERIOR CERVICAL DECOMPRESSION/DISCECTOMY FUSIONCERVICAL 4- CERVICAL 5, CERVICAL 5- CERVICAL 6 (N/A )     Patient location during evaluation: PACU Anesthesia Type: General Level of consciousness: awake and alert Pain management: pain level controlled Vital Signs Assessment: post-procedure vital signs reviewed and stable Respiratory status: spontaneous breathing, nonlabored ventilation, respiratory function stable and patient connected to nasal cannula oxygen Cardiovascular status: blood pressure returned to baseline and stable Postop Assessment: no apparent nausea or vomiting Anesthetic complications: no    Last Vitals:  Vitals:   09/15/17 0819 09/15/17 1229  BP: (!) 146/76 (!) 169/80  Pulse: 70 69  Resp: 20 20  Temp: 36.6 C 36.6 C  SpO2: 97% 98%    Last Pain:  Vitals:   09/15/17 1229  TempSrc: Oral  PainSc:                  [name]

## 2017-09-29 ENCOUNTER — Encounter: Payer: Self-pay | Admitting: Family Medicine

## 2017-09-29 ENCOUNTER — Ambulatory Visit: Payer: Self-pay | Attending: Family Medicine | Admitting: Family Medicine

## 2017-09-29 VITALS — BP 150/96 | HR 68 | Temp 97.6°F | Ht 73.0 in | Wt 204.6 lb

## 2017-09-29 DIAGNOSIS — F17210 Nicotine dependence, cigarettes, uncomplicated: Secondary | ICD-10-CM | POA: Insufficient documentation

## 2017-09-29 DIAGNOSIS — E119 Type 2 diabetes mellitus without complications: Secondary | ICD-10-CM | POA: Insufficient documentation

## 2017-09-29 DIAGNOSIS — Z833 Family history of diabetes mellitus: Secondary | ICD-10-CM | POA: Insufficient documentation

## 2017-09-29 DIAGNOSIS — J439 Emphysema, unspecified: Secondary | ICD-10-CM | POA: Insufficient documentation

## 2017-09-29 DIAGNOSIS — I700 Atherosclerosis of aorta: Secondary | ICD-10-CM | POA: Insufficient documentation

## 2017-09-29 DIAGNOSIS — J449 Chronic obstructive pulmonary disease, unspecified: Secondary | ICD-10-CM

## 2017-09-29 DIAGNOSIS — Z79899 Other long term (current) drug therapy: Secondary | ICD-10-CM | POA: Insufficient documentation

## 2017-09-29 DIAGNOSIS — J438 Other emphysema: Secondary | ICD-10-CM

## 2017-09-29 DIAGNOSIS — I2510 Atherosclerotic heart disease of native coronary artery without angina pectoris: Secondary | ICD-10-CM | POA: Insufficient documentation

## 2017-09-29 DIAGNOSIS — I10 Essential (primary) hypertension: Secondary | ICD-10-CM | POA: Insufficient documentation

## 2017-09-29 DIAGNOSIS — Z720 Tobacco use: Secondary | ICD-10-CM | POA: Insufficient documentation

## 2017-09-29 HISTORY — DX: Chronic obstructive pulmonary disease, unspecified: J44.9

## 2017-09-29 HISTORY — DX: Tobacco use: Z72.0

## 2017-09-29 HISTORY — DX: Essential (primary) hypertension: I10

## 2017-09-29 LAB — POCT GLYCOSYLATED HEMOGLOBIN (HGB A1C): Hemoglobin A1C: 6.8

## 2017-09-29 MED ORDER — FLUTICASONE-SALMETEROL 250-50 MCG/DOSE IN AEPB: 1.0000 | INHALATION_SPRAY | Freq: Two times a day (BID) | RESPIRATORY_TRACT | 3 refills | Status: DC

## 2017-09-29 MED ORDER — ALBUTEROL SULFATE HFA 108 (90 BASE) MCG/ACT IN AERS: 2.0000 | INHALATION_SPRAY | Freq: Four times a day (QID) | RESPIRATORY_TRACT | 0 refills | Status: DC | PRN

## 2017-09-29 MED ORDER — AMLODIPINE BESYLATE 5 MG PO TABS: 5.0000 mg | ORAL_TABLET | Freq: Every day | ORAL | 3 refills | Status: DC

## 2017-09-29 MED ORDER — NICOTINE 21 MG/24HR TD PT24: 21.0000 mg | MEDICATED_PATCH | Freq: Every day | TRANSDERMAL | 0 refills | Status: DC

## 2017-09-29 MED ORDER — METFORMIN HCL 500 MG PO TABS: 500.0000 mg | ORAL_TABLET | Freq: Every day | ORAL | 3 refills | Status: DC

## 2017-09-29 NOTE — Progress Notes (Signed)
Subjective:  Patient ID: [name] Slade Sr., male    DOB: 10-27-1960  Age: 57 y.o. MRN: [id number]  CC: Establish Care and Hospitalization Follow-up   HPI [name] Morawski Sr. is a 57 year old male who presents to the clinic today accompanied by his mother  to establish care after hospitalization for anterior cervical decompression and fusion C4,5 and C5,6 on 09/14/17  By Dr Saintclair Halsted secondary to Myelopathy and he reports improvement in upper extremity weakness after surgery and had his post op visit with his surgeon yesterday.  During his hospitalization CT chest performed revealed findings suggestive of COPD and CAD and [name] [name] endorses smoking 2 packs of cigarrette daily for the last 36 years.He denies shortness of breath but has a chronic cough and has intermittent chest pains.  CT Chest 09/09/17 IMPRESSION: 1. No acute findings in the thorax to account for the patient's symptoms. 2. Mild diffuse bronchial wall thickening with mild centrilobular and paraseptal emphysema; imaging findings suggestive of underlying COPD. 3. Aortic atherosclerosis, in addition to left main and 3 vessel coronary artery disease. Please note that although the presence of coronary artery calcium documents the presence of coronary artery disease, the severity of this disease and any potential stenosis cannot be assessed on this non-gated CT examination. Assessment for potential risk factor modification, dietary therapy or pharmacologic therapy may be warranted, if clinically indicated.  Aortic Atherosclerosis (ICD10-I70.0) and Emphysema (ICD10-J43.9).   His blood pressure is elevated today and he is currently not on antihypertensives and his A1c came back at 6.8. He does have a family history of Diabetes Mellitus.  Past Medical History:  Diagnosis Date  . Anxiety   . Cough   . Depression   . Dyspnea     Past Surgical History:  Procedure Laterality Date  . ANTERIOR CERVICAL DECOMP/DISCECTOMY FUSION  N/A 09/14/2017   Procedure: ANTERIOR CERVICAL DECOMPRESSION/DISCECTOMY FUSIONCERVICAL 4- CERVICAL 5, CERVICAL 5- CERVICAL 6;  Surgeon: Kary Kos, MD;  Location: [facility];  Service: Neurosurgery;  Laterality: N/A;  ANTERIOR CERVICAL DECOMPRESSION/DISCECTOMY FUSIONCERVICAL 4- CERVICAL 5, CERVICAL 5- CERVICAL 6  . NO PAST SURGERIES      No Known Allergies   Outpatient Medications Prior to Visit  Medication Sig Dispense Refill  . HYDROcodone-acetaminophen (NORCO/VICODIN) 5-325 MG tablet Take 1 tablet by mouth every 6 (six) hours as needed for moderate pain.    . meloxicam (MOBIC) 15 MG tablet Take 1 tablet (15 mg total) by mouth daily. Take 1 daily with food. 10 tablet 0  . methocarbamol (ROBAXIN) 750 MG tablet Take 750 mg by mouth every 4 (four) hours.    . ALPRAZolam (XANAX) 0.5 MG tablet Take 0.5 mg by mouth at bedtime as needed for anxiety.    . cyclobenzaprine (FLEXERIL) 10 MG tablet Take 1 tablet (10 mg total) by mouth 3 (three) times daily as needed for muscle spasms. (Patient not taking: Reported on 09/29/2017) 30 tablet 1  . ibuprofen (ADVIL,MOTRIN) 200 MG tablet Take 400 mg by mouth every 6 (six) hours as needed for moderate pain.    Marland Kitchen oxyCODONE 10 MG TABS Take 1 tablet (10 mg total) by mouth every 6 (six) hours as needed for severe pain ((score 7 to 10)). (Patient not taking: Reported on 09/29/2017) 30 tablet 0  . predniSONE (STERAPRED UNI-PAK 21 TAB) 10 MG (21) TBPK tablet Use as directed (Patient not taking: Reported on 09/29/2017) 21 tablet 0   No facility-administered medications prior to visit.     ROS Review of Systems  Constitutional: Negative for activity change and appetite change.  HENT: Negative for sinus pressure and sore throat.   Eyes: Negative for visual disturbance.  Respiratory: Positive for cough. Negative for chest tightness and shortness of breath.   Cardiovascular: Negative for chest pain and leg swelling.  Gastrointestinal: Negative for abdominal distention, abdominal  pain, constipation and diarrhea.  Endocrine: Negative.   Genitourinary: Negative for dysuria.  Musculoskeletal: Negative for joint swelling and myalgias.  Skin: Negative for rash.  Allergic/Immunologic: Negative.   Neurological: Negative for weakness, light-headedness and numbness.  Psychiatric/Behavioral: Negative for dysphoric mood and suicidal ideas.    Objective:  BP (!) 150/96   Pulse 68   Temp 97.6 F (36.4 C) (Oral)   Ht '6\' 1"'$  (1.854 m)   Wt 204 lb 9.6 oz (92.8 kg)   SpO2 98%   BMI 26.99 kg/m   BP/Weight 09/29/2017 09/15/2017 4/50/3888  Systolic BP 280 034 -  Diastolic BP 96 80 -  Wt. (Lbs) 204.6 205 -  BMI 26.99 - 26.32      Physical Exam  Constitutional: He is oriented to person, place, and time. He appears well-developed and well-nourished.  Neck:  Anterior neck scar,   Cardiovascular: Normal rate, normal heart sounds and intact distal pulses.  No murmur heard. Pulmonary/Chest: Effort normal and breath sounds normal. He has no wheezes. He has no rales. He exhibits no tenderness.  Abdominal: Soft. Bowel sounds are normal. He exhibits no distension and no mass. There is no tenderness.  Musculoskeletal: Normal range of motion.  Neurological: He is alert and oriented to person, place, and time.  Skin: Skin is warm and dry.  Psychiatric: He has a normal mood and affect.     Assessment & Plan:   1. Other emphysema ([place]) COPD evident on CT chest Commenced on Advair and Proventil Advised to quit smoking  2. Tobacco abuse Spent 3 minutes counseling on cessation and he is working on cessation  3. Essential hypertension Newly diagnosed Counseled on blood pressure goal of less than 130/80, low-sodium, DASH diet, medication compliance, 150 minutes of moderate intensity exercise per week. Discussed medication compliance, adverse effects. - Lipid panel; Future - CMP14+EGFR; Future  4. 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 Type 2 diabetes mellitus without complication, without long-term current use of insulin ([place]) Newly diagnosed with A1c of 6.8 Commence Metformin as he is high risk Diabetic education with Clinical pharmacist at next visit and education on blood sugar monitoring - POCT glycosylated hemoglobin (Hb A1C) - metFORMIN (GLUCOPHAGE) 500 MG tablet; Take 1 tablet (500 mg total) by mouth daily with breakfast.  Dispense: 30 tablet; Refill: 3   Meds ordered this encounter  Medications  . Fluticasone-Salmeterol (ADVAIR DISKUS) 250-50 MCG/DOSE AEPB    Sig: Inhale 1 puff into the lungs 2 (two) times daily.    Dispense:  1 each    Refill:  3  . albuterol (PROVENTIL HFA;VENTOLIN HFA) 108 (90 Base) MCG/ACT inhaler    Sig: Inhale 2 puffs into the lungs every 6 (six) hours as needed for wheezing or shortness of breath.    Dispense:  1 Inhaler    Refill:  0  . nicotine (NICODERM CQ) 21 mg/24hr patch    Sig: Place 1 patch (21 mg total) onto the skin daily.    Dispense:  28 patch    Refill:  0  .  amLODipine (NORVASC) 5 MG tablet    Sig: Take 1 tablet (5 mg total) by mouth daily.    Dispense:  30 tablet    Refill:  3  . metFORMIN (GLUCOPHAGE) 500 MG tablet    Sig: Take 1 tablet (500 mg total) by mouth daily with breakfast.    Dispense:  30 tablet    Refill:  3    Follow-up: Return in about 3 weeks (around 10/20/2017) for Follow-up of medical conditions.   Charlott Rakes MD

## 2017-09-29 NOTE — Patient Instructions (Signed)
Diabetes Mellitus and Nutrition When you have diabetes (diabetes mellitus), it is very important to have healthy eating habits because your blood sugar (glucose) levels are greatly affected by what you eat and drink. Eating healthy foods in the appropriate amounts, at about the same times every day, can help you:  Control your blood glucose.  Lower your risk of heart disease.  Improve your blood pressure.  Reach or maintain a healthy weight.  Every person with diabetes is different, and each person has different needs for a meal plan. Your health care provider may recommend that you work with a diet and nutrition specialist (dietitian) to make a meal plan that is best for you. Your meal plan may vary depending on factors such as:  The calories you need.  The medicines you take.  Your weight.  Your blood glucose, blood pressure, and cholesterol levels.  Your activity level.  Other health conditions you have, such as heart or kidney disease.  How do carbohydrates affect me? Carbohydrates affect your blood glucose level more than any other type of food. Eating carbohydrates naturally increases the amount of glucose in your blood. Carbohydrate counting is a method for keeping track of how many carbohydrates you eat. Counting carbohydrates is important to keep your blood glucose at a healthy level, especially if you use insulin or take certain oral diabetes medicines. It is important to know how many carbohydrates you can safely have in each meal. This is different for every person. Your dietitian can help you calculate how many carbohydrates you should have at each meal and for snack. Foods that contain carbohydrates include:  Bread, cereal, rice, pasta, and crackers.  Potatoes and corn.  Peas, beans, and lentils.  Milk and yogurt.  Fruit and juice.  Desserts, such as cakes, cookies, ice cream, and candy.  How does alcohol affect me? Alcohol can cause a sudden decrease in blood  glucose (hypoglycemia), especially if you use insulin or take certain oral diabetes medicines. Hypoglycemia can be a life-threatening condition. Symptoms of hypoglycemia (sleepiness, dizziness, and confusion) are similar to symptoms of having too much alcohol. If your health care provider says that alcohol is safe for you, follow these guidelines:  Limit alcohol intake to no more than 1 drink per day for nonpregnant women and 2 drinks per day for men. One drink equals 12 oz of beer, 5 oz of wine, or 1 oz of hard liquor.  Do not drink on an empty stomach.  Keep yourself hydrated with water, diet soda, or unsweetened iced tea.  Keep in mind that regular soda, juice, and other mixers may contain a lot of sugar and must be counted as carbohydrates.  What are tips for following this plan? Reading food labels  Start by checking the serving size on the label. The amount of calories, carbohydrates, fats, and other nutrients listed on the label are based on one serving of the food. Many foods contain more than one serving per package.  Check the total grams (g) of carbohydrates in one serving. You can calculate the number of servings of carbohydrates in one serving by dividing the total carbohydrates by 15. For example, if a food has 30 g of total carbohydrates, it would be equal to 2 servings of carbohydrates.  Check the number of grams (g) of saturated and trans fats in one serving. Choose foods that have low or no amount of these fats.  Check the number of milligrams (mg) of sodium in one serving. Most people   should limit total sodium intake to less than 2,300 mg per day.  Always check the nutrition information of foods labeled as "low-fat" or "nonfat". These foods may be higher in added sugar or refined carbohydrates and should be avoided.  Talk to your dietitian to identify your daily goals for nutrients listed on the label. Shopping  Avoid buying canned, premade, or processed foods. These  foods tend to be high in fat, sodium, and added sugar.  Shop around the outside edge of the grocery store. This includes fresh fruits and vegetables, bulk grains, fresh meats, and fresh dairy. Cooking  Use low-heat cooking methods, such as baking, instead of high-heat cooking methods like deep frying.  Cook using healthy oils, such as olive, canola, or sunflower oil.  Avoid cooking with butter, cream, or high-fat meats. Meal planning  Eat meals and snacks regularly, preferably at the same times every day. Avoid going long periods of time without eating.  Eat foods high in fiber, such as fresh fruits, vegetables, beans, and whole grains. Talk to your dietitian about how many servings of carbohydrates you can eat at each meal.  Eat 4-6 ounces of lean protein each day, such as lean meat, chicken, fish, eggs, or tofu. 1 ounce is equal to 1 ounce of meat, chicken, or fish, 1 egg, or 1/4 cup of tofu.  Eat some foods each day that contain healthy fats, such as avocado, nuts, seeds, and fish. Lifestyle   Check your blood glucose regularly.  Exercise at least 30 minutes 5 or more days each week, or as told by your health care provider.  Take medicines as told by your health care provider.  Do not use any products that contain nicotine or tobacco, such as cigarettes and e-cigarettes. If you need help quitting, ask your health care provider.  Work with a counselor or diabetes educator to identify strategies to manage stress and any emotional and social challenges. What are some questions to ask my health care provider?  Do I need to meet with a diabetes educator?  Do I need to meet with a dietitian?  What number can I call if I have questions?  When are the best times to check my blood glucose? Where to find more information:  American Diabetes Association: diabetes.org/food-and-fitness/food  Academy of Nutrition and Dietetics:  www.eatright.org/resources/health/diseases-and-conditions/diabetes  National Institute of Diabetes and Digestive and Kidney Diseases (NIH): www.niddk.nih.gov/health-information/diabetes/overview/diet-eating-physical-activity Summary  A healthy meal plan will help you control your blood glucose and maintain a healthy lifestyle.  Working with a diet and nutrition specialist (dietitian) can help you make a meal plan that is best for you.  Keep in mind that carbohydrates and alcohol have immediate effects on your blood glucose levels. It is important to count carbohydrates and to use alcohol carefully. This information is not intended to replace advice given to you by your health care provider. Make sure you discuss any questions you have with your health care provider. Document Released: 03/10/2005 Document Revised: 07/18/2016 Document Reviewed: 07/18/2016 Elsevier Interactive Patient Education  2018 Elsevier Inc.  

## 2017-10-02 ENCOUNTER — Ambulatory Visit: Payer: Self-pay | Attending: Family Medicine

## 2017-10-02 DIAGNOSIS — I10 Essential (primary) hypertension: Secondary | ICD-10-CM | POA: Insufficient documentation

## 2017-10-02 NOTE — Progress Notes (Signed)
Patient here for lab visit only 

## 2017-10-03 ENCOUNTER — Telehealth: Payer: Self-pay

## 2017-10-03 ENCOUNTER — Other Ambulatory Visit: Payer: Self-pay | Admitting: Family Medicine

## 2017-10-03 LAB — CMP14+EGFR
ALBUMIN: 2.9 g/dL — AB (ref 3.5–5.5)
ALT: 18 IU/L (ref 0–44)
AST: 16 IU/L (ref 0–40)
Albumin/Globulin Ratio: 1.3 (ref 1.2–2.2)
Alkaline Phosphatase: 62 IU/L (ref 39–117)
BUN / CREAT RATIO: 15 (ref 9–20)
BUN: 20 mg/dL (ref 6–24)
Bilirubin Total: <0.2 mg/dL (ref 0.0–1.2)
CALCIUM: 9.1 mg/dL (ref 8.7–10.2)
CO2: 23 mmol/L (ref 20–29)
CREATININE: 1.34 mg/dL — AB (ref 0.76–1.27)
Chloride: 101 mmol/L (ref 96–106)
GFR calc non Af Amer: 58 mL/min/{1.73_m2} — ABNORMAL LOW (ref >59)
GFR, EST AFRICAN AMERICAN: 67 mL/min/{1.73_m2} (ref >59)
Globulin, Total: 2.3 g/dL (ref 1.5–4.5)
Glucose: 132 mg/dL — ABNORMAL HIGH (ref 65–99)
Potassium: 6 mmol/L — ABNORMAL HIGH (ref 3.5–5.2)
Sodium: 137 mmol/L (ref 134–144)
TOTAL PROTEIN: 5.2 g/dL — AB (ref 6.0–8.5)

## 2017-10-03 LAB — LIPID PANEL
Chol/HDL Ratio: 5.1 ratio — ABNORMAL HIGH (ref 0.0–5.0)
Cholesterol, Total: 325 mg/dL — ABNORMAL HIGH (ref 100–199)
HDL: 64 mg/dL (ref >39)
LDL CALC: 236 mg/dL — AB (ref 0–99)
Triglycerides: 123 mg/dL (ref 0–149)
VLDL CHOLESTEROL CAL: 25 mg/dL (ref 5–40)

## 2017-10-03 MED ORDER — ATORVASTATIN CALCIUM 80 MG PO TABS: 80.0000 mg | ORAL_TABLET | Freq: Every day | ORAL | 3 refills | Status: DC

## 2017-10-03 NOTE — Telephone Encounter (Signed)
Patient was called and informed of lab results and medication addition.

## 2017-10-06 DIAGNOSIS — F329 Major depressive disorder, single episode, unspecified: Secondary | ICD-10-CM | POA: Insufficient documentation

## 2017-10-06 DIAGNOSIS — F419 Anxiety disorder, unspecified: Secondary | ICD-10-CM

## 2017-10-06 DIAGNOSIS — F32A Depression, unspecified: Secondary | ICD-10-CM

## 2017-10-06 DIAGNOSIS — I2510 Atherosclerotic heart disease of native coronary artery without angina pectoris: Secondary | ICD-10-CM

## 2017-10-06 DIAGNOSIS — R0600 Dyspnea, unspecified: Secondary | ICD-10-CM | POA: Insufficient documentation

## 2017-10-06 HISTORY — DX: Depression, unspecified: F32.A

## 2017-10-06 HISTORY — DX: Anxiety disorder, unspecified: F41.9

## 2017-10-06 HISTORY — DX: Atherosclerotic heart disease of native coronary artery without angina pectoris: I25.10

## 2017-10-09 DIAGNOSIS — I2510 Atherosclerotic heart disease of native coronary artery without angina pectoris: Secondary | ICD-10-CM

## 2017-10-09 HISTORY — DX: Atherosclerotic heart disease of native coronary artery without angina pectoris: I25.10

## 2017-10-09 NOTE — Progress Notes (Signed)
Cardiology Office Note:    Date:  10/10/2017   ID:  [name] Slade Sr., DOB June 19, 1961, MRN [id number]  PCP:  Charlott Rakes, MD  Cardiologist:  Shirlee More, MD   Referring MD: Charlott Rakes, MD  ASSESSMENT:    1. Coronary artery calcification seen on CT scan   2. Angina pectoris ([place])   3. Essential hypertension   4. Chest pain, unspecified type   5. Localized edema    PLAN:    In order of problems listed above:  1. He has coronary artery calcification and typical angina.  For further evaluation he will undergo pharmacologic myocardial perfusion study, I would have preferred to have done cardiac CTA but I do not want to wait several weeks for the results.  He was given a prescription for nitroglycerin to use if needed we will continue his current antianginal therapy with a calcium channel blocker we will not start a beta-blocker at this bronchospasm continue his statin and I will ask his neurosurgeon if he can start low-dose aspirin.  If symptoms worsen or high risk findings he will need coronary angiography. 2. See above 3. Continue current treatment await results of myocardial perfusion study his edema is mild and related to calcium channel blocker 4. See discussion above myocardial perfusion study ordered 5. Related to calcium channel blocker continue for the time being as I am hesitant to give him a beta-blocker with bronchospasm  Next appointment 2 weeks    Medication Adjustments/Labs and Tests Ordered: Current medicines are reviewed at length with the patient today.  Concerns regarding medicines are outlined above.  Orders Placed This Encounter  Procedures  . Myocardial Perfusion Imaging  . ECHOCARDIOGRAM COMPLETE   Meds ordered this encounter  Medications  . nitroGLYCERIN (NITROSTAT) 0.4 MG SL tablet    Sig: Place 1 tablet (0.4 mg total) under the tongue every 5 (five) minutes as needed for chest pain.    Dispense:  25 tablet    Refill:  11     Chief Complaint   Patient presents with  . New Patient (Initial Visit)    per Dr Margarita Rana to evaluate abnormal CT scan    History of Present Illness:    [name] Mojica Sr. is a 57 y.o. male who is being seen today for the evaluation of coronary artert calcification on CT scan at the request of Charlott Rakes, MD. CT chest 09/09/17: IMPRESSION: 1. No acute findings in the thorax to account for the patient's symptoms. 2. Mild diffuse bronchial wall thickening with mild centrilobular and paraseptal emphysema; imaging findings suggestive of underlying COPD. 3. Aortic atherosclerosis, in addition to left main and 3 vessel coronary artery disease. Please note that although the presence of coronary artery calcium documents the presence of coronary artery disease, the severity of this disease and any potential stenosis cannot be assessed on this non-gated CT examination. Assessment for potential risk factor modification, dietary therapy or pharmacologic therapy may be warranted, if clinically indicated.  He has not done well in general and worsens his spine surgery.  He has a long-standing history of exertional chest discomfort that he describes as tightness and pressure through the precordium but it is worsened and occurs relatively minor activities also at rest but fortunately is relieved within a few minutes at rest and occurs now daily but has not awakened him from his sleep.  He has shortness of breath with any activity and also has underlying COPD and is doing his best to stop smoking.  He  has no palpitation syncope or TIA his chest pain is mild to moderate in severity not associated with radiation to the shoulder back nausea vomiting or diaphoresis.  His wife is concerned about lower extremity swelling he is on a calcium channel blocker and he also has bronchospasm and uses a bronchodilator.  Recently was noted to have coronary artery calcification EKG recent sinus rhythm normal Past Medical History:  Diagnosis Date    . Anxiety   . CAD (coronary artery disease) 10/06/2017  . COPD (chronic obstructive pulmonary disease) ([place]) 09/29/2017  . Cough   . Depression   . Dyspnea   . 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 Hypertension 09/29/2017  . Myelopathy ([place]) 09/14/2017  . Tobacco abuse 09/29/2017  . Type 2 diabetes mellitus ([place]) 10/17/2011    Past Surgical History:  Procedure Laterality Date  . ANTERIOR CERVICAL DECOMP/DISCECTOMY FUSION N/A 09/14/2017   Procedure: ANTERIOR CERVICAL DECOMPRESSION/DISCECTOMY FUSIONCERVICAL 4- CERVICAL 5, CERVICAL 5- CERVICAL 6;  Surgeon: Kary Kos, MD;  Location: [facility];  Service: Neurosurgery;  Laterality: N/A;  ANTERIOR CERVICAL DECOMPRESSION/DISCECTOMY FUSIONCERVICAL 4- CERVICAL 5, CERVICAL 5- CERVICAL 6    Current Medications: Current Meds  Medication Sig  . albuterol (PROVENTIL HFA;VENTOLIN HFA) 108 (90 Base) MCG/ACT inhaler Inhale 2 puffs into the lungs every 6 (six) hours as needed for wheezing or shortness of breath.  . ALPRAZolam (XANAX) 0.5 MG tablet Take 0.5 mg by mouth at bedtime as needed for anxiety.  Marland Kitchen amLODipine (NORVASC) 5 MG tablet Take 1 tablet (5 mg total) by mouth daily.  Marland Kitchen atorvastatin (LIPITOR) 80 MG tablet Take 1 tablet (80 mg total) by mouth daily.  . cetirizine (ZYRTEC) 10 MG tablet Take 10 mg by mouth daily.  Marland Kitchen HYDROcodone-acetaminophen (NORCO/VICODIN) 5-325 MG tablet Take 1 tablet by mouth every 6 (six) hours as needed for moderate pain.  Marland Kitchen ibuprofen (ADVIL,MOTRIN) 200 MG tablet Take 400 mg by mouth every 6 (six) hours as needed for moderate pain.  . meloxicam (MOBIC) 15 MG tablet Take 1 tablet (15 mg total) by mouth daily. Take 1 daily with food.  . metFORMIN (GLUCOPHAGE) 500 MG tablet Take 1 tablet (500 mg total) by mouth daily with breakfast.  . nicotine (NICODERM CQ) 21 mg/24hr patch Place 1 patch (21 mg total) onto the skin daily.     Allergies:   Patient has no known allergies.   Social History   Socioeconomic History  . Marital status:  Single    Spouse name: Not on file  . Number of children: Not on file  . Years of education: Not on file  . Highest education level: Not on file  Occupational History  . Not on file  Social Needs  . Financial resource strain: Not on file  . Food insecurity:    Worry: Not on file    Inability: Not on file  . Transportation needs:    Medical: Not on file    Non-medical: Not on file  Tobacco Use  . Smoking status: Current Every Day Smoker    Packs/day: 2.00    Years: 42.00    Pack years: 84.00  . Smokeless tobacco: Never Used  Substance and Sexual Activity  . Alcohol use: Yes  . Drug use: No  . Sexual activity: Not on file  Lifestyle  . Physical activity:    Days per week: Not on file    Minutes per session: Not on file  . Stress: Not on file  Relationships  . Social connections:  Talks on phone: Not on file    Gets together: Not on file    Attends religious service: Not on file    Active member of club or organization: Not on file    Attends meetings of clubs or organizations: Not on file    Relationship status: Not on file  Other Topics Concern  . Not on file  Social History Narrative  . Not on file     Family History: The patient's family history includes Diabetes in his mother; Hypertension in his brother; Kidney cancer in his father; Lung cancer in his father; Scoliosis in his mother.  ROS:   Review of Systems  Constitution: Positive for malaise/fatigue and weight loss.  HENT: Negative.   Cardiovascular: Positive for chest pain, dyspnea on exertion and leg swelling.  Respiratory: Positive for shortness of breath and wheezing.   Endocrine: Negative.   Hematologic/Lymphatic: Negative.   Skin: Negative.   Musculoskeletal: Positive for back pain and muscle weakness.  Gastrointestinal: Negative.   Genitourinary: Negative.   Neurological: Positive for loss of balance and paresthesias.  Psychiatric/Behavioral: Negative.   Allergic/Immunologic: Negative.     Please see the history of present illness.     All other systems reviewed and are negative.  EKGs/Labs/Other Studies Reviewed:    The following studies were reviewed today:    EKG: 09/10/17 [place] normal EKG Recent Labs: 09/09/2017: Hemoglobin 17.5; Platelets 335 10/02/2017: ALT 18; BUN 20; Creatinine, Ser 1.34; Potassium 6.0; Sodium 137  Recent Lipid Panel    Component Value Date/Time   CHOL 325 (H) 10/02/2017 0947   TRIG 123 10/02/2017 0947   HDL 64 10/02/2017 0947   CHOLHDL 5.1 (H) 10/02/2017 0947   LDLCALC 236 (H) 10/02/2017 0947    Physical Exam:    VS:  BP (!) 148/82 (BP Location: Left Arm, Patient Position: Sitting, Cuff Size: Large)   Pulse 72   Ht 6\' 2"  (1.88 m)   Wt 201 lb (91.2 kg)   SpO2 97%   BMI 25.81 kg/m     Wt Readings from Last 3 Encounters:  10/10/17 201 lb (91.2 kg)  09/29/17 204 lb 9.6 oz (92.8 kg)  09/15/17 205 lb (93 kg)     GEN: He looks quite chronically ill and older than his age well nourished, well developed in no acute distress HEENT: Normal NECK: No JVD; No carotid bruits LYMPHATICS: No lymphadenopathy CARDIAC: RRR, no murmurs, rubs, gallops RESPIRATORY: Diminished breath sounds expiratory wheezing ABDOMEN: Soft, non-tender, non-distended MUSCULOSKELETAL: 1-2+ ankle bilateral edema; No deformity  SKIN: Warm and dry NEUROLOGIC:  Alert and oriented x 3 PSYCHIATRIC:  Normal affect     Signed, Shirlee More, MD  10/10/2017 10:45 AM    [place]

## 2017-10-10 ENCOUNTER — Encounter: Payer: Self-pay | Admitting: Cardiology

## 2017-10-10 ENCOUNTER — Ambulatory Visit (INDEPENDENT_AMBULATORY_CARE_PROVIDER_SITE_OTHER): Payer: Self-pay | Admitting: Cardiology

## 2017-10-10 VITALS — BP 148/82 | HR 72 | Ht 74.0 in | Wt 201.0 lb

## 2017-10-10 DIAGNOSIS — R079 Chest pain, unspecified: Secondary | ICD-10-CM

## 2017-10-10 DIAGNOSIS — R600 Localized edema: Secondary | ICD-10-CM

## 2017-10-10 DIAGNOSIS — I10 Essential (primary) hypertension: Secondary | ICD-10-CM

## 2017-10-10 DIAGNOSIS — I2510 Atherosclerotic heart disease of native coronary artery without angina pectoris: Secondary | ICD-10-CM

## 2017-10-10 DIAGNOSIS — I209 Angina pectoris, unspecified: Secondary | ICD-10-CM

## 2017-10-10 MED ORDER — NITROGLYCERIN 0.4 MG SL SUBL: 0.4000 mg | SUBLINGUAL_TABLET | SUBLINGUAL | 11 refills | Status: DC | PRN

## 2017-10-10 NOTE — Patient Instructions (Signed)
Medication Instructions:  Your physician has recommended you make the following change in your medication:  START nitroglycerin 0.4 mg sublingual (under your tongue) every 5 minutes as needed for chest pain. The proper use and anticipated side effects of nitroglycerine has been carefully explained.  If a single episode of chest pain is not relieved by one tablet, the patient will try another within 5 minutes; and if this doesn't relieve the pain, the patient is instructed to call 911 for transportation to an emergency department.  Labwork: None  Testing/Procedures: Your physician has requested that you have an echocardiogram. Echocardiography is a painless test that uses sound waves to create images of your heart. It provides your doctor with information about the size and shape of your heart and how well your heart's chambers and valves are working. This procedure takes approximately one hour. There are no restrictions for this procedure.  Your physician has requested that you have a lexiscan myoview. For further information please visit [email or web address]. Please follow instruction sheet, as given.  Follow-Up: Your physician recommends that you schedule a follow-up appointment in: 2 weeks.  Any Other Special Instructions Will Be Listed Below (If Applicable).     If you need a refill on your cardiac medications before your next appointment, please call your pharmacy.  Nitroglycerin sublingual tablets What is this medicine? NITROGLYCERIN (nye troe GLI ser in) is a type of vasodilator. It relaxes blood vessels, increasing the blood and oxygen supply to your heart. This medicine is used to relieve chest pain caused by angina. It is also used to prevent chest pain before activities like climbing stairs, going outdoors in cold weather, or sexual activity. This medicine may be used for other purposes; ask your health care provider or pharmacist if you have questions. COMMON BRAND NAME(S):  Nitroquick, Nitrostat, Nitrotab What should I tell my health care provider before I take this medicine? They need to know if you have any of these conditions: -anemia -head injury, recent stroke, or bleeding in the brain -liver disease -previous heart attack -an unusual or allergic reaction to nitroglycerin, other medicines, foods, dyes, or preservatives -pregnant or trying to get pregnant -breast-feeding How should I use this medicine? Take this medicine by mouth as needed. At the first sign of an angina attack (chest pain or tightness) place one tablet under your tongue. You can also take this medicine 5 to 10 minutes before an event likely to produce chest pain. Follow the directions on the prescription label. Let the tablet dissolve under the tongue. Do not swallow whole. Replace the dose if you accidentally swallow it. It will help if your mouth is not dry. Saliva around the tablet will help it to dissolve more quickly. Do not eat or drink, smoke or chew tobacco while a tablet is dissolving. If you are not better within 5 minutes after taking ONE dose of nitroglycerin, call 9-1-1 immediately to seek emergency medical care. Do not take more than 3 nitroglycerin tablets over 15 minutes. If you take this medicine often to relieve symptoms of angina, your doctor or health care professional may provide you with different instructions to manage your symptoms. If symptoms do not go away after following these instructions, it is important to call 9-1-1 immediately. Do not take more than 3 nitroglycerin tablets over 15 minutes. Talk to your pediatrician regarding the use of this medicine in children. Special care may be needed. Overdosage: If you think you have taken too much of this medicine contact a poison  control center or emergency room at once. NOTE: This medicine is only for you. Do not share this medicine with others. What if I miss a dose? This does not apply. This medicine is only used as  needed. What may interact with this medicine? Do not take this medicine with any of the following medications: -certain migraine medicines like ergotamine and dihydroergotamine (DHE) -medicines used to treat erectile dysfunction like sildenafil, tadalafil, and vardenafil -riociguat This medicine may also interact with the following medications: -alteplase -aspirin -heparin -medicines for high blood pressure -medicines for mental depression -other medicines used to treat angina -phenothiazines like chlorpromazine, mesoridazine, prochlorperazine, thioridazine This list may not describe all possible interactions. Give your health care provider a list of all the medicines, herbs, non-prescription drugs, or dietary supplements you use. Also tell them if you smoke, drink alcohol, or use illegal drugs. Some items may interact with your medicine. What should I watch for while using this medicine? Tell your doctor or health care professional if you feel your medicine is no longer working. Keep this medicine with you at all times. Sit or lie down when you take your medicine to prevent falling if you feel dizzy or faint after using it. Try to remain calm. This will help you to feel better faster. If you feel dizzy, take several deep breaths and lie down with your feet propped up, or bend forward with your head resting between your knees. You may get drowsy or dizzy. Do not drive, use machinery, or do anything that needs mental alertness until you know how this drug affects you. Do not stand or sit up quickly, especially if you are an older patient. This reduces the risk of dizzy or fainting spells. Alcohol can make you more drowsy and dizzy. Avoid alcoholic drinks. Do not treat yourself for coughs, colds, or pain while you are taking this medicine without asking your doctor or health care professional for advice. Some ingredients may increase your blood pressure. What side effects may I notice from  receiving this medicine? Side effects that you should report to your doctor or health care professional as soon as possible: -blurred vision -dry mouth -skin rash -sweating -the feeling of extreme pressure in the head -unusually weak or tired Side effects that usually do not require medical attention (report to your doctor or health care professional if they continue or are bothersome): -flushing of the face or neck -headache -irregular heartbeat, palpitations -nausea, vomiting This list may not describe all possible side effects. Call your doctor for medical advice about side effects. You may report side effects to FDA at 1-800-FDA-1088. Where should I keep my medicine? Keep out of the reach of children. Store at room temperature between 20 and 25 degrees C (68 and 77 degrees F). Store in [facility] [employer]. Protect from light and moisture. Keep tightly closed. Throw away any unused medicine after the expiration date. NOTE: This sheet is a summary. It may not cover all possible information. If you have questions about this medicine, talk to your doctor, pharmacist, or health care provider.  2018 Elsevier/Gold Standard (2013-04-11 17:57:36)

## 2017-10-17 ENCOUNTER — Emergency Department (HOSPITAL_COMMUNITY)
Admission: EM | Admit: 2017-10-17 | Discharge: 2017-10-17 | Disposition: A | Discharge status: Home / Self Care | Payer: Self-pay | Attending: Emergency Medicine | Admitting: Emergency Medicine

## 2017-10-17 ENCOUNTER — Other Ambulatory Visit: Payer: Self-pay

## 2017-10-17 ENCOUNTER — Encounter (HOSPITAL_COMMUNITY): Payer: Self-pay

## 2017-10-17 ENCOUNTER — Emergency Department (HOSPITAL_COMMUNITY): Payer: Self-pay

## 2017-10-17 DIAGNOSIS — I10 Essential (primary) hypertension: Secondary | ICD-10-CM | POA: Insufficient documentation

## 2017-10-17 DIAGNOSIS — R1110 Vomiting, unspecified: Secondary | ICD-10-CM | POA: Insufficient documentation

## 2017-10-17 DIAGNOSIS — J449 Chronic obstructive pulmonary disease, unspecified: Secondary | ICD-10-CM | POA: Insufficient documentation

## 2017-10-17 DIAGNOSIS — D72829 Elevated white blood cell count, unspecified: Secondary | ICD-10-CM | POA: Insufficient documentation

## 2017-10-17 DIAGNOSIS — F17210 Nicotine dependence, cigarettes, uncomplicated: Secondary | ICD-10-CM | POA: Insufficient documentation

## 2017-10-17 DIAGNOSIS — R569 Unspecified convulsions: Secondary | ICD-10-CM | POA: Insufficient documentation

## 2017-10-17 DIAGNOSIS — E119 Type 2 diabetes mellitus without complications: Secondary | ICD-10-CM | POA: Insufficient documentation

## 2017-10-17 DIAGNOSIS — Z79899 Other long term (current) drug therapy: Secondary | ICD-10-CM | POA: Insufficient documentation

## 2017-10-17 DIAGNOSIS — I2510 Atherosclerotic heart disease of native coronary artery without angina pectoris: Secondary | ICD-10-CM | POA: Insufficient documentation

## 2017-10-17 DIAGNOSIS — Z7984 Long term (current) use of oral hypoglycemic drugs: Secondary | ICD-10-CM | POA: Insufficient documentation

## 2017-10-17 LAB — BASIC METABOLIC PANEL
ANION GAP: 9 (ref 5–15)
BUN: 21 mg/dL — ABNORMAL HIGH (ref 6–20)
CALCIUM: 8.5 mg/dL — AB (ref 8.9–10.3)
CO2: 22 mmol/L (ref 22–32)
Chloride: 101 mmol/L (ref 101–111)
Creatinine, Ser: 1.56 mg/dL — ABNORMAL HIGH (ref 0.61–1.24)
GFR, EST AFRICAN AMERICAN: 55 mL/min — AB (ref >60)
GFR, EST NON AFRICAN AMERICAN: 47 mL/min — AB (ref >60)
GLUCOSE: 153 mg/dL — AB (ref 65–99)
POTASSIUM: 4.8 mmol/L (ref 3.5–5.1)
Sodium: 132 mmol/L — ABNORMAL LOW (ref 135–145)

## 2017-10-17 LAB — I-STAT CG4 LACTIC ACID, ED: Lactic Acid, Venous: 1.09 mmol/L (ref 0.5–1.9)

## 2017-10-17 LAB — CBC WITH DIFFERENTIAL/PLATELET
Basophils Absolute: 0 K/uL (ref 0.0–0.1)
Basophils Relative: 0 %
Eosinophils Absolute: 0.2 K/uL (ref 0.0–0.7)
Eosinophils Relative: 1 %
HCT: 44.2 % (ref 39.0–52.0)
Hemoglobin: 15.4 g/dL (ref 13.0–17.0)
Lymphocytes Relative: 16 %
Lymphs Abs: 2.4 K/uL (ref 0.7–4.0)
MCH: 33 pg (ref 26.0–34.0)
MCHC: 34.8 g/dL (ref 30.0–36.0)
MCV: 94.6 fL (ref 78.0–100.0)
Monocytes Absolute: 1.2 K/uL — ABNORMAL HIGH (ref 0.1–1.0)
Monocytes Relative: 8 %
Neutro Abs: 10.6 K/uL — ABNORMAL HIGH (ref 1.7–7.7)
Neutrophils Relative %: 75 %
Platelets: 256 K/uL (ref 150–400)
RBC: 4.67 MIL/uL (ref 4.22–5.81)
RDW: 13.2 % (ref 11.5–15.5)
WBC: 14.3 K/uL — ABNORMAL HIGH (ref 4.0–10.5)

## 2017-10-17 LAB — URINALYSIS, COMPLETE (UACMP) WITH MICROSCOPIC
Bacteria, UA: NONE SEEN
Bilirubin Urine: NEGATIVE
GLUCOSE, UA: NEGATIVE mg/dL
Ketones, ur: NEGATIVE mg/dL
LEUKOCYTES UA: NEGATIVE
NITRITE: NEGATIVE
Protein, ur: >=300 mg/dL — AB
SPECIFIC GRAVITY, URINE: 1.015 (ref 1.005–1.030)
pH: 5 (ref 5.0–8.0)

## 2017-10-17 LAB — MAGNESIUM: Magnesium: 2 mg/dL (ref 1.7–2.4)

## 2017-10-17 LAB — ETHANOL

## 2017-10-17 LAB — RAPID URINE DRUG SCREEN, HOSP PERFORMED
AMPHETAMINES: NOT DETECTED
BARBITURATES: NOT DETECTED
Benzodiazepines: NOT DETECTED
Cocaine: NOT DETECTED
Opiates: POSITIVE — AB
TETRAHYDROCANNABINOL: POSITIVE — AB

## 2017-10-17 LAB — SALICYLATE LEVEL: Salicylate Lvl: <7 mg/dL (ref 2.8–30.0)

## 2017-10-17 LAB — CBG MONITORING, ED: Glucose-Capillary: 182 mg/dL — ABNORMAL HIGH (ref 65–99)

## 2017-10-17 LAB — ACETAMINOPHEN LEVEL: Acetaminophen (Tylenol), Serum: <10 ug/mL — ABNORMAL LOW (ref 10–30)

## 2017-10-17 MED ORDER — HYDROCODONE-ACETAMINOPHEN 5-325 MG PO TABS
1.0000 | ORAL_TABLET | Freq: Once | ORAL | Status: AC
  Administered 2017-10-17: 1 via ORAL
  Filled 2017-10-17: qty 1

## 2017-10-17 MED ORDER — SODIUM CHLORIDE 0.9 % IV BOLUS
1000.0000 mL | Freq: Once | INTRAVENOUS | Status: AC
  Administered 2017-10-17: 1000 mL via INTRAVENOUS

## 2017-10-17 MED ORDER — LEVETIRACETAM 500 MG PO TABS: 500.0000 mg | ORAL_TABLET | Freq: Two times a day (BID) | ORAL | 0 refills | Status: DC

## 2017-10-17 MED ORDER — LEVETIRACETAM 500 MG PO TABS
500.0000 mg | ORAL_TABLET | Freq: Once | ORAL | Status: AC
  Administered 2017-10-17: 500 mg via ORAL
  Filled 2017-10-17: qty 1

## 2017-10-17 NOTE — ED Provider Notes (Signed)
[facility] EMERGENCY DEPARTMENT Provider Note   CSN: [id number] Arrival date & time: 10/17/17  1715     History   Chief Complaint Chief Complaint  Patient presents with  . Seizures    HPI [name] Wassenaar Sr. is a 57 y.o. male with a history of Dm Type II, COPD, HTN, alcohol use disorder, and CAD to the emergency department by EMS with a chief complaint of seizure.  EMS reports patient is coming from home with new onset seizure activity.  Patient had 2 witnessed grand mal seizures that lasted for approximately 20 seconds each with 20 seconds of apnea between each episode.  He was postictal for about 5 minutes.  Upon EMS arrival, patient was pale and clammy, but A&O x4.  He does not recall either event.  Patient states that prior to the onset of seizure that he was sitting and "hadn't even made it through half of a beer yet."  Patient endorses 5-6 beers daily.  Last drink was 1 AM last night.  He reports that he has not eaten today.  No history of previous seizures or DTs.  He states that a few years ago that he was able to go few years with abstaining from alcohol.  He also endorses NBNB emesis x1  and a severe, frontal and posterior headache prior to seizure.  He denies visual changes, soreness of breath, chest pain, abdominal pain, rash, syncope, numbness, or weakness.  He denies biting his tongue.   No new medications.  He denies Illicit or recreational drugs.  No recent falls or injuries.  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  The history is provided by the patient. No language interpreter was used.  Seizures   Associated symptoms include headaches and vomiting. Pertinent negatives include no confusion, no speech difficulty, no visual disturbance, no chest pain and no nausea.    Past Medical History:  Diagnosis Date  . Anxiety   . CAD (coronary artery disease) 10/06/2017  . COPD (chronic obstructive pulmonary disease)  ([place]) 09/29/2017  . Cough   . Depression   . Dyspnea   . 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 Hypertension 09/29/2017  . Myelopathy ([place]) 09/14/2017  . Tobacco abuse 09/29/2017  . Type 2 diabetes mellitus ([place]) 10/17/2011    Patient Active Problem List   Diagnosis Date Noted  . Angina pectoris ([place]) 10/10/2017  . Coronary artery calcification seen on CT scan 10/09/2017  . Anxiety 10/06/2017  . Dyspnea 10/06/2017  . CAD (coronary artery disease) 10/06/2017  . COPD (chronic obstructive pulmonary disease) ([place]) 09/29/2017  . Tobacco abuse 09/29/2017  . Hypertension 09/29/2017  . Myelopathy ([place]) 09/14/2017  . Type 2 diabetes mellitus ([place]) 10/17/2011  . 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    Past Surgical History:  Procedure Laterality Date  . ANTERIOR CERVICAL DECOMP/DISCECTOMY FUSION N/A 09/14/2017   Procedure: ANTERIOR CERVICAL DECOMPRESSION/DISCECTOMY FUSIONCERVICAL 4- CERVICAL 5, CERVICAL 5- CERVICAL 6;  Surgeon: Kary Kos, MD;  Location: [facility];  Service: Neurosurgery;  Laterality: N/A;  ANTERIOR CERVICAL DECOMPRESSION/DISCECTOMY FUSIONCERVICAL 4- CERVICAL 5, CERVICAL 5- CERVICAL 6        Home Medications    Prior to Admission medications   Medication Sig Start Date End Date Taking? Authorizing Provider  acetaminophen (TYLENOL) 500 MG tablet Take 1,000 mg by mouth every 6 (six) hours as needed.   Yes [provider]  albuterol (PROVENTIL HFA;VENTOLIN HFA) 108 (90 Base) MCG/ACT inhaler Inhale 2 puffs into the lungs every 6 (  six) hours as needed for wheezing or shortness of breath. 09/29/17  Yes Charlott Rakes, MD  amLODipine (NORVASC) 5 MG tablet Take 1 tablet (5 mg total) by mouth daily. 09/29/17  Yes Charlott Rakes, MD  atorvastatin (LIPITOR) 80 MG tablet Take 1 tablet (80 mg total) by mouth daily. 10/03/17  Yes Charlott Rakes, MD  cetirizine (ZYRTEC) 10 MG tablet Take 10 mg by mouth daily.   Yes [provider]  HYDROcodone-acetaminophen (NORCO/VICODIN) 5-325 MG tablet  Take 1 tablet by mouth every 6 (six) hours as needed for moderate pain.   Yes [provider]  meloxicam (MOBIC) 15 MG tablet Take 1 tablet (15 mg total) by mouth daily. Take 1 daily with food. 09/09/17  Yes Margarita Mail, PA-C  metFORMIN (GLUCOPHAGE) 500 MG tablet Take 1 tablet (500 mg total) by mouth daily with breakfast. 09/29/17  Yes Newlin, Enobong, MD  nicotine (NICODERM CQ) 21 mg/24hr patch Place 1 patch (21 mg total) onto the skin daily. 09/29/17  Yes Charlott Rakes, MD  nitroGLYCERIN (NITROSTAT) 0.4 MG SL tablet Place 1 tablet (0.4 mg total) under the tongue every 5 (five) minutes as needed for chest pain. 10/10/17 01/08/18 Yes Richardo Priest, MD  Oxycodone HCl 10 MG TABS Take 10 mg by mouth as needed.   Yes [provider]  levETIRAcetam (KEPPRA) 500 MG tablet Take 1 tablet (500 mg total) by mouth 2 (two) times daily. 10/17/17 11/16/17  Breeze Angell, Laymond Purser, PA-C    Family History Family History  Problem Relation Age of Onset  . Diabetes Mother   . Scoliosis Mother   . Kidney cancer Father   . Lung cancer Father   . Hypertension Brother     Social History Social History   Tobacco Use  . Smoking status: Current Every Day Smoker    Packs/day: 2.00    Years: 42.00    Pack years: 84.00  . Smokeless tobacco: Never Used  Substance Use Topics  . Alcohol use: Yes  . Drug use: No     Allergies   Patient has no known allergies.   Review of Systems Review of Systems  Constitutional: Negative for appetite change and fever.  HENT: Negative for congestion and trouble swallowing.   Eyes: Negative for visual disturbance.  Respiratory: Negative for shortness of breath.   Cardiovascular: Negative for chest pain.  Gastrointestinal: Positive for vomiting. Negative for abdominal pain and nausea.  Genitourinary: Negative for dysuria and urgency.  Musculoskeletal: Negative for back pain.  Skin: Negative for rash.  Allergic/Immunologic: Negative for immunocompromised state.   Neurological: Positive for seizures and headaches. Negative for dizziness, syncope, facial asymmetry, speech difficulty, weakness and numbness.  Hematological: Does not bruise/bleed easily.  Psychiatric/Behavioral: Negative for agitation and confusion.     Physical Exam Updated Vital Signs BP 140/83   Pulse 74   Temp 98.2 F (36.8 C)   Resp 18   Ht 6\' 2"  (1.88 m)   Wt 91.2 kg (201 lb)   SpO2 94%   BMI 25.81 kg/m   Physical Exam  Constitutional: He is oriented to person, place, and time. He appears well-developed.  HENT:  Head: Normocephalic.  Right Ear: Tympanic membrane normal.  Left Ear: Tympanic membrane normal.  Nose: Nose normal.  Mouth/Throat: Uvula is midline and oropharynx is clear and moist.  Eyes: Pupils are equal, round, and reactive to light. Conjunctivae and EOM are normal. No scleral icterus.  Neck: Neck supple. No JVD present.  No meningismus  Cardiovascular: Normal rate,  regular rhythm, normal heart sounds and intact distal pulses. Exam reveals no gallop and no friction rub.  No murmur heard. Pulmonary/Chest: Effort normal and breath sounds normal. No stridor. No respiratory distress. He has no wheezes. He has no rales. He exhibits no tenderness.  Abdominal: Soft. Bowel sounds are normal. He exhibits no distension and no mass. There is no tenderness. There is no rebound and no guarding. No hernia.  Musculoskeletal: Normal range of motion. He exhibits tenderness. He exhibits no edema or deformity.  Mildly tender to palpation to the spinous processes of the cervical spine.  Lymphadenopathy:    He has no cervical adenopathy.  Neurological: He is alert and oriented to person, place, and time.  Cranial nerves II through XII are grossly intact.  Speaking in complete fluent sentences.  He is alert and oriented x4.  Mentation is intact.  5 out of 5 strength of the bilateral upper and lower extremities.  Sensation is intact throughout.  Negative Romberg.  No pronator  drift.  Symmetric tandem gait.  2+ DTRs.  No clonus.  Finger-to-nose is intact bilaterally.  Skin: Skin is warm and dry. Capillary refill takes less than 2 seconds. No rash noted. No erythema. No pallor.  Psychiatric: His behavior is normal.  Nursing note and vitals reviewed.    ED Treatments / Results  Labs (all labs ordered are listed, but only abnormal results are displayed) Labs Reviewed  CBC WITH DIFFERENTIAL/PLATELET - Abnormal; Notable for the following components:      Result Value   WBC 14.3 (*)    Neutro Abs 10.6 (*)    Monocytes Absolute 1.2 (*)    All other components within normal limits  RAPID URINE DRUG SCREEN, HOSP PERFORMED - Abnormal; Notable for the following components:   Opiates POSITIVE (*)    Tetrahydrocannabinol POSITIVE (*)    All other components within normal limits  URINALYSIS, COMPLETE (UACMP) WITH MICROSCOPIC - Abnormal; Notable for the following components:   APPearance HAZY (*)    Hgb urine dipstick MODERATE (*)    Protein, ur >=300 (*)    All other components within normal limits  BASIC METABOLIC PANEL - Abnormal; Notable for the following components:   Sodium 132 (*)    Glucose, Bld 153 (*)    BUN 21 (*)    Creatinine, Ser 1.56 (*)    Calcium 8.5 (*)    GFR calc non Af Amer 47 (*)    GFR calc Af Amer 55 (*)    All other components within normal limits  ACETAMINOPHEN LEVEL - Abnormal; Notable for the following components:   Acetaminophen (Tylenol), Serum <10 (*)    All other components within normal limits  CBG MONITORING, ED - Abnormal; Notable for the following components:   Glucose-Capillary 182 (*)    All other components within normal limits  SALICYLATE LEVEL  ETHANOL  MAGNESIUM  I-STAT CG4 LACTIC ACID, ED    EKG EKG Interpretation  Date/Time:  Tuesday October 17 2017 17:27:04 EDT Ventricular Rate:  63 PR Interval:    QRS Duration: 104 QT Interval:  430 QTC Calculation: 441 R Axis:   65 Text Interpretation:  Sinus rhythm  Borderline prolonged PR interval Low voltage, precordial leads No STEMI.  Confirmed by Nanda Quinton [phone]) on 10/17/2017 5:37:44 PM   Radiology Ct Head Wo Contrast  Result Date: 10/17/2017 CLINICAL DATA:  Seizure, new, nontraumatic. EXAM: CT HEAD WITHOUT CONTRAST TECHNIQUE: Contiguous axial images were obtained from the base of the skull through  the vertex without intravenous contrast. COMPARISON:  09/09/2017 FINDINGS: Brain: No evidence of acute infarction, hemorrhage, hydrocephalus, extra-axial collection or mass lesion/mass effect. Small focus of cortically based low-density in the posterior and high right frontal lobe. Vascular: Mild atherosclerotic calcification. Skull: No acute finding.  Right parietal scalp scarring. Sinuses/Orbits: Negative IMPRESSION: 1. No acute finding. 2. History of seizure with small focus of high right frontal encephalomalacia. Electronically Signed   By: Monte Fantasia M.D.   On: 10/17/2017 18:10    Procedures Procedures (including critical care time)  Medications Ordered in ED Medications  sodium chloride 0.9 % bolus 1,000 mL (0 mLs Intravenous Stopped 10/17/17 2245)  levETIRAcetam (KEPPRA) tablet 500 mg (500 mg Oral Given 10/17/17 2101)  HYDROcodone-acetaminophen (NORCO/VICODIN) 5-325 MG per tablet 1 tablet (1 tablet Oral Given 10/17/17 2101)     Initial Impression / Assessment and Plan / ED Course  I have reviewed the triage vital signs and the nursing notes.  Pertinent labs & imaging results that were available during my care of the patient were reviewed by me and considered in my medical decision making (see chart for details).     57 year old male with a history of Dm Type II, COPD, HTN, alcohol use disorder, and CAD to the emergency department by EMS with a chief complaint of seizure.  No seizure activity since arrival to the ED.  Glucose is 182.  EKG with sinus rhythm and borderline prolonged PR interval.  CBC with mild leukocytosis of 14.  UDS is  positive for opiates and THC.  UA with 3+ proteinuria.  Ethanol, acetaminophen, salicylate levels are normal.  Mildly hyponatremic at 132.  Magnesium is 2.0.  Calcium 8.5.  CT head demonstrating history of seizure with small focus of high right frontal encephalomalacia.  Patient does endorse a frontal headache that is been constant since his seizure.  Discussed these findings with Dr. Laverta Baltimore, attending physician who recommended following up with neurology given CT reading of history of seizure given that this is his first presentation.  Spoke with Dr. Cheral Marker regarding this finding who felt that the small focus could have precipitated the patient's seizure today, particularly if the seizure threshold had been lowered.  Patient does endorse that his last beer was approximately 15 hours ago, which could have led to his seizure threshold being lowered.  Dr. Cheral Marker recommended initiating Keppra in the ED since the patient did not have any significant hypocalcemia, hyponatremia, or hypomagnesemia.  He was also given an IV fluid bolus.  He successfully ambulated in the ED prior to discharge.  The plan including initiating the patient on Keppra, following up with neurology in the clinic, discharged home was discussed with the patient and his significant other who are agreeable at this time.  Strict return precautions given.  He is hemodynamically stable and in no acute distress is safe for discharge to home with outpatient follow-up.  Final Clinical Impressions(s) / ED Diagnoses   Final diagnoses:  Seizures [facility]    ED Discharge Orders        Ordered    levETIRAcetam (KEPPRA) 500 MG tablet  2 times daily     10/17/17 2300       Doak Mah A, PA-C 10/18/17 0330    Long, Wonda Olds, MD 10/18/17 [phone]

## 2017-10-17 NOTE — ED Notes (Signed)
Main lab to add on Mg

## 2017-10-17 NOTE — Discharge Instructions (Signed)
Please make sure to discuss with your primary care physician that your urine contained protein in it today.  Call [place] neurology to schedule follow-up appointment.   Take 1 tablet of Keppra twice daily until you are seen by neurology.  If you develop new or worsening symptoms including if you have another seizure, pass out, have the worst headache of your life, or chest pain with shortness or breath, please return to the emergency department for reevaluation.

## 2017-10-17 NOTE — ED Notes (Signed)
Pt walked around nurses station with cane, gait steady, no problems noted, pt stated he felt good while walking

## 2017-10-17 NOTE — ED Notes (Signed)
Gave pt a [place] sandwich and sprite

## 2017-10-17 NOTE — ED Notes (Signed)
Patient transported to CT 

## 2017-10-17 NOTE — ED Triage Notes (Signed)
Pt from home with ems c.o new onset seizure. Pt had two witnessed grand mal seizures back to back lasting about 20 seconds each. Witness states he had  20 sec period of apnea in between. Pt was postictal for about 5 mins. Upon EMS arrival pt was pale and clammy, alert and oriented x4. Hx of a spinal fusion in neck 4 weeks ago but no other neuro hx. Pt a.o upon arrival to ED, VSS, nad noted at this time.

## 2017-10-18 ENCOUNTER — Ambulatory Visit: Payer: Self-pay | Attending: Family Medicine | Admitting: Licensed Clinical Social Worker

## 2017-10-18 ENCOUNTER — Ambulatory Visit: Payer: Self-pay | Attending: Family Medicine | Admitting: Family Medicine

## 2017-10-18 ENCOUNTER — Encounter: Payer: Self-pay | Admitting: Family Medicine

## 2017-10-18 VITALS — BP 160/93 | HR 74 | Temp 97.4°F | Wt 201.0 lb

## 2017-10-18 DIAGNOSIS — R569 Unspecified convulsions: Secondary | ICD-10-CM

## 2017-10-18 DIAGNOSIS — F1010 Alcohol abuse, uncomplicated: Secondary | ICD-10-CM | POA: Insufficient documentation

## 2017-10-18 DIAGNOSIS — Z7984 Long term (current) use of oral hypoglycemic drugs: Secondary | ICD-10-CM | POA: Insufficient documentation

## 2017-10-18 DIAGNOSIS — F419 Anxiety disorder, unspecified: Secondary | ICD-10-CM | POA: Insufficient documentation

## 2017-10-18 DIAGNOSIS — F32A Depression, unspecified: Secondary | ICD-10-CM

## 2017-10-18 DIAGNOSIS — F4323 Adjustment disorder with mixed anxiety and depressed mood: Secondary | ICD-10-CM

## 2017-10-18 DIAGNOSIS — G959 Disease of spinal cord, unspecified: Secondary | ICD-10-CM | POA: Insufficient documentation

## 2017-10-18 DIAGNOSIS — G629 Polyneuropathy, unspecified: Secondary | ICD-10-CM

## 2017-10-18 DIAGNOSIS — I10 Essential (primary) hypertension: Secondary | ICD-10-CM | POA: Insufficient documentation

## 2017-10-18 DIAGNOSIS — F329 Major depressive disorder, single episode, unspecified: Secondary | ICD-10-CM | POA: Insufficient documentation

## 2017-10-18 DIAGNOSIS — G9389 Other specified disorders of brain: Secondary | ICD-10-CM | POA: Insufficient documentation

## 2017-10-18 DIAGNOSIS — F10129 Alcohol abuse with intoxication, unspecified: Secondary | ICD-10-CM | POA: Insufficient documentation

## 2017-10-18 DIAGNOSIS — Z95811 Presence of heart assist device: Secondary | ICD-10-CM | POA: Insufficient documentation

## 2017-10-18 DIAGNOSIS — E1142 Type 2 diabetes mellitus with diabetic polyneuropathy: Secondary | ICD-10-CM | POA: Insufficient documentation

## 2017-10-18 DIAGNOSIS — E1165 Type 2 diabetes mellitus with hyperglycemia: Secondary | ICD-10-CM | POA: Insufficient documentation

## 2017-10-18 DIAGNOSIS — R0602 Shortness of breath: Secondary | ICD-10-CM | POA: Insufficient documentation

## 2017-10-18 DIAGNOSIS — Z79899 Other long term (current) drug therapy: Secondary | ICD-10-CM | POA: Insufficient documentation

## 2017-10-18 DIAGNOSIS — Z981 Arthrodesis status: Secondary | ICD-10-CM | POA: Insufficient documentation

## 2017-10-18 DIAGNOSIS — I2510 Atherosclerotic heart disease of native coronary artery without angina pectoris: Secondary | ICD-10-CM | POA: Insufficient documentation

## 2017-10-18 DIAGNOSIS — J449 Chronic obstructive pulmonary disease, unspecified: Secondary | ICD-10-CM | POA: Insufficient documentation

## 2017-10-18 HISTORY — DX: Alcohol abuse, uncomplicated: F10.10

## 2017-10-18 HISTORY — DX: Polyneuropathy, unspecified: G62.9

## 2017-10-18 HISTORY — DX: Unspecified convulsions: R56.9

## 2017-10-18 LAB — GLUCOSE, POCT (MANUAL RESULT ENTRY): POC Glucose: 168 mg/dl — AB (ref 70–99)

## 2017-10-18 MED ORDER — GABAPENTIN 300 MG PO CAPS: 300.0000 mg | ORAL_CAPSULE | Freq: Two times a day (BID) | ORAL | 3 refills | Status: DC

## 2017-10-18 MED ORDER — GABAPENTIN 300 MG PO CAPS
300.0000 mg | ORAL_CAPSULE | Freq: Two times a day (BID) | ORAL | 3 refills | Status: DC
Start: 2017-10-18 — End: 2018-01-11

## 2017-10-18 MED ORDER — DULOXETINE HCL 60 MG PO CPEP: 60.0000 mg | ORAL_CAPSULE | Freq: Every day | ORAL | 3 refills | Status: DC

## 2017-10-18 MED ORDER — AMLODIPINE BESYLATE 10 MG PO TABS: 10.0000 mg | ORAL_TABLET | Freq: Every day | ORAL | 3 refills | Status: DC

## 2017-10-18 NOTE — BH Specialist Note (Signed)
Integrated Behavioral Health Initial Visit  MRN: [id number] Name: [name] Akram Sr.  Number of [facility] Clinician visits:: 1/6 Session Start time: 11:00 AM  Session End time: 11:30 AM Total time: 30 minutes  Type of Service: [place] Interpretor:No. Interpretor Name and Language: N/A   Warm Hand Off Completed.       SUBJECTIVE: [name] Reader Sr. is a 57 y.o. male accompanied by Partner/Significant Other Patient was referred by Dr. Margarita Rana for depression. Patient reports the following symptoms/concerns: overwhelming feelings of sadness and worry about finances, difficulty sleeping, low motivation, decreased concentration, irritability, and substance use Duration of problem: 1 month; Severity of problem: severe  OBJECTIVE: Mood: Anxious and Depressed and Affect: Depressed Risk of harm to self or others: No plan to harm self or others  LIFE CONTEXT: Family and Social: Pt receives strong support from family (daughters, son, and mother) and significant other School/Work: Pt is unable to work due to ongoing medical conditions.  Self-Care: Pt drinks approximately 6 beers daily. He has agreed to medication management through PCP Life Changes: Pt has ongoing medical conditions that has resulted in recent hospitalizations. Family is experiencing anxiety about finances due to pt's inability to work   GOALS ADDRESSED: Patient will: 1. Reduce symptoms of: anxiety and depression 2. Increase knowledge and/or ability of: coping skills  3. Demonstrate ability to: Increase healthy adjustment to current life circumstances and Decrease self-medicating behaviors  INTERVENTIONS: Interventions utilized: Mindfulness or Psychologist, educational, Supportive Counseling, Psychoeducation and/or Health Education and Link to [employer]  Standardized Assessments completed: GAD-7 and PHQ 2&9  ASSESSMENT: Patient currently experiencing depression  and anxiety triggered by ongoing medical conditions and financial strain from inability to work. He reports overwhelming feelings of sadness and worry about finances, difficulty sleeping, low motivation, decreased concentration, irritability, and substance use. He receives strong support from significant other (who was present during visit) and family.    Patient may benefit from psychoeducation and psychotherapy. [place] educated pt on correlation between one's physical and mental health, in addition, to how substance use can negatively impact health. Pt's feelings of being overwhelmed with change was validated and encouragement was provided. Therapeutic intervention to decrease symptoms and promote positive feelings were discussed. Pt agreed to medication management through PCP. Resources for crisis intervention, psychotherapy, and food insecurity was provided. Pt was strongly encouraged to schedule appointment with Financial Counseling. Pt verbalized understanding.  PLAN: 1. Follow up with behavioral health clinician on : Pt was encouraged to contact LCSWA if symptoms worsen or fail to improve to schedule behavioral appointments at [facility]. 2. Behavioral recommendations: LCSWA recommends that pt apply healthy coping skills discussed, comply with medication management, and utilize provided resources. Pt is encouraged to schedule appointment with financial counseling and follow up appointment with LCSWA 3. Referral(s): [place] (In Clinic) and [employer] Resources:  Presenter, broadcasting 4. "From scale of 1-10, how likely are you to follow plan?": 10/10  Rebekah [place], LCSW 10/20/17 3:10 PM

## 2017-10-18 NOTE — Progress Notes (Signed)
Subjective:  Patient ID: [name] Slade Sr., male    DOB: 05-May-1961  Age: 57 y.o. MRN: [id number]  CC: Diabetes; Hypertension; and Hospitalization Follow-up   HPI [name] Jakob Sr. is a 57 year old male with a medical history of of type 2 diabetes mellitus (A1c 6.8), hypertension, alcohol abuse, coronary artery calcifications, ACDF of C4,5 and C5,6 on 09/14/17  By Dr Saintclair Halsted secondary to Myelopathy who presents to the clinic accompanied by his significant other after an ED visit yesterday for new onset seizures.  Labs reveal slightly elevated creatinine, urine toxicology was positive for THC and opioids, notes indicate patient is a heavy drinker but no previous history of alcohol withdrawal seizures which I have corroborated with the patient. CT head revealed focus of small high right frontal encephalomalacia and patient was commenced on Keppra as per neurology recommendations  with plans to follow-up with neurology outpatient.  His significant other informs me last week she thought he had a mini seizure in his sleep but was unsure.  He endorses consuming 6-7 beers a day but his girlfriend states he has never had alcohol withdrawal seizures in the past. Patient endorses being stressed from being unable to work or lift heavy objects due to his neck and hand symptoms as he continues to experience numbness in his upper extremities, he has no job and no source of income and his recent medical conditions have also added to his stress.  Past Medical History:  Diagnosis Date  . Anxiety   . CAD (coronary artery disease) 10/06/2017  . COPD (chronic obstructive pulmonary disease) ([place]) 09/29/2017  . Cough   . Depression   . Dyspnea   . 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 Hypertension 09/29/2017  . Myelopathy ([place]) 09/14/2017  . Tobacco abuse 09/29/2017  . Type 2 diabetes mellitus ([place]) 10/17/2011    Past Surgical History:  Procedure Laterality Date  . ANTERIOR CERVICAL DECOMP/DISCECTOMY FUSION N/A 09/14/2017     Procedure: ANTERIOR CERVICAL DECOMPRESSION/DISCECTOMY FUSIONCERVICAL 4- CERVICAL 5, CERVICAL 5- CERVICAL 6;  Surgeon: Kary Kos, MD;  Location: [facility];  Service: Neurosurgery;  Laterality: N/A;  ANTERIOR CERVICAL DECOMPRESSION/DISCECTOMY FUSIONCERVICAL 4- CERVICAL 5, CERVICAL 5- CERVICAL 6    No Known Allergies   Outpatient Medications Prior to Visit  Medication Sig Dispense Refill  . acetaminophen (TYLENOL) 500 MG tablet Take 1,000 mg by mouth every 6 (six) hours as needed.    Marland Kitchen albuterol (PROVENTIL HFA;VENTOLIN HFA) 108 (90 Base) MCG/ACT inhaler Inhale 2 puffs into the lungs every 6 (six) hours as needed for wheezing or shortness of breath. 1 Inhaler 0  . atorvastatin (LIPITOR) 80 MG tablet Take 1 tablet (80 mg total) by mouth daily. 30 tablet 3  . cetirizine (ZYRTEC) 10 MG tablet Take 10 mg by mouth daily.    Marland Kitchen HYDROcodone-acetaminophen (NORCO/VICODIN) 5-325 MG tablet Take 1 tablet by mouth every 6 (six) hours as needed for moderate pain.    Marland Kitchen levETIRAcetam (KEPPRA) 500 MG tablet Take 1 tablet (500 mg total) by mouth 2 (two) times daily. 60 tablet 0  . meloxicam (MOBIC) 15 MG tablet Take 1 tablet (15 mg total) by mouth daily. Take 1 daily with food. 10 tablet 0  . metFORMIN (GLUCOPHAGE) 500 MG tablet Take 1 tablet (500 mg total) by mouth daily with breakfast. 30 tablet 3  . nicotine (NICODERM CQ) 21 mg/24hr patch Place 1 patch (21 mg total) onto the skin daily. 28 patch 0  . nitroGLYCERIN (NITROSTAT) 0.4 MG SL tablet Place 1 tablet (  0.4 mg total) under the tongue every 5 (five) minutes as needed for chest pain. 25 tablet 11  . amLODipine (NORVASC) 5 MG tablet Take 1 tablet (5 mg total) by mouth daily. 30 tablet 3  . Oxycodone HCl 10 MG TABS Take 10 mg by mouth as needed.     No facility-administered medications prior to visit.     ROS Review of Systems  Constitutional: Negative for activity change and appetite change.  HENT: Negative for sinus pressure and sore throat.   Eyes:  Negative for visual disturbance.  Respiratory: Negative for cough, chest tightness and shortness of breath.   Cardiovascular: Negative for chest pain and leg swelling.  Gastrointestinal: Negative for abdominal distention, abdominal pain, constipation and diarrhea.  Endocrine: Negative.   Genitourinary: Negative for dysuria.  Musculoskeletal: Negative for joint swelling and myalgias.  Skin: Negative for rash.  Allergic/Immunologic: Negative.   Neurological: Positive for numbness. Negative for weakness and light-headedness.  Psychiatric/Behavioral: Positive for dysphoric mood. Negative for suicidal ideas.    Objective:  BP (!) 160/93   Pulse 74   Temp (!) 97.4 F (36.3 C) (Oral)   Wt 201 lb (91.2 kg)   SpO2 98%   BMI 25.81 kg/m   BP/Weight 10/18/2017 10/17/2017 10/18/5359  Systolic BP 443 154 008  Diastolic BP 93 83 82  Wt. (Lbs) 201 201 201  BMI 25.81 25.81 25.81      Physical Exam  Constitutional: He is oriented to person, place, and time. He appears well-developed and well-nourished.  Cardiovascular: Normal rate, normal heart sounds and intact distal pulses.  No murmur heard. Pulmonary/Chest: Effort normal and breath sounds normal. He has no wheezes. He has no rales. He exhibits no tenderness.  Abdominal: Soft. Bowel sounds are normal. He exhibits no distension and no mass. There is no tenderness.  Musculoskeletal: Normal range of motion.  Neurological: He is alert and oriented to person, place, and time.  Skin: Skin is warm and dry.  Psychiatric:  Dysphoric mood    Lab Results  Component Value Date   HGBA1C 6.8 09/29/2017    EXAM: CT HEAD WITHOUT CONTRAST  TECHNIQUE: Contiguous axial images were obtained from the base of the skull through the vertex without intravenous contrast.  COMPARISON:  09/09/2017  FINDINGS: Brain: No evidence of acute infarction, hemorrhage, hydrocephalus, extra-axial collection or mass lesion/mass effect.  Small focus of  cortically based low-density in the posterior and high right frontal lobe.  Vascular: Mild atherosclerotic calcification.  Skull: No acute finding.  Right parietal scalp scarring.  Sinuses/Orbits: Negative  IMPRESSION: 1. No acute finding. 2. History of seizure with small focus of high right frontal encephalomalacia.  Assessment & Plan:   1. Type 2 diabetes mellitus with hyperglycemia, without long-term current use of insulin (HCC) Controlled with A1c of 6.8 Continue medications Diabetic diet, lifestyle modifications - POCT glucose (manual entry)  2. Essential hypertension Uncontrolled Increase dose of amlodipine Low sodium, DASH diet - amLODipine (NORVASC) 10 MG tablet; Take 1 tablet (10 mg total) by mouth daily.  Dispense: 30 tablet; Refill: 3  3. Anxiety and depression Secondary to underlying stressors, multiple medical conditions LCSW called in for counseling Commenced on Cymbalta - DULoxetine (CYMBALTA) 60 MG capsule; Take 1 capsule (60 mg total) by mouth daily.  Dispense: 30 capsule; Refill: 3  4. Seizure [facility]) New onset Will need neurology referral-advised to apply for the [place] discount to facilitate referral Continue Keppra Advised to abstain from alcohol  5. Alcohol abuse I have  discussed hazardous effects of alcohol consumption and the need to quit  6. Neuropathy Status post ACDF Commence gabapentin-side effects discussed - gabapentin (NEURONTIN) 300 MG capsule; Take 1 capsule (300 mg total) by mouth 2 (two) times daily.  Dispense: 60 capsule; Refill: 3   Meds ordered this encounter  Medications  . DULoxetine (CYMBALTA) 60 MG capsule    Sig: Take 1 capsule (60 mg total) by mouth daily.    Dispense:  30 capsule    Refill:  3  . amLODipine (NORVASC) 10 MG tablet    Sig: Take 1 tablet (10 mg total) by mouth daily.    Dispense:  30 tablet    Refill:  3    Discontinue previous dose  . gabapentin (NEURONTIN) 300 MG capsule    Sig: Take 1  capsule (300 mg total) by mouth 2 (two) times daily.    Dispense:  60 capsule    Refill:  3    Follow-up: Return in about 1 month (around 11/17/2017) for Follow-up on seizures.   Charlott Rakes MD

## 2017-10-18 NOTE — Patient Instructions (Signed)

## 2017-10-19 ENCOUNTER — Telehealth (HOSPITAL_COMMUNITY): Payer: Self-pay | Admitting: *Deleted

## 2017-10-19 NOTE — Telephone Encounter (Signed)
Patient given detailed instructions per Myocardial Perfusion Study Information Sheet for the test on 10/24/17 Patient notified to arrive 15 minutes early and that it is imperative to arrive on time for appointment to keep from having the test rescheduled.  If you need to cancel or reschedule your appointment, please call the office within 24 hours of your appointment. . Patient verbalized understanding. Kirstie Peri

## 2017-10-24 ENCOUNTER — Ambulatory Visit (HOSPITAL_COMMUNITY): Payer: Self-pay | Attending: Cardiovascular Disease

## 2017-10-24 ENCOUNTER — Ambulatory Visit (HOSPITAL_BASED_OUTPATIENT_CLINIC_OR_DEPARTMENT_OTHER): Payer: Self-pay

## 2017-10-24 ENCOUNTER — Other Ambulatory Visit: Payer: Self-pay

## 2017-10-24 DIAGNOSIS — J449 Chronic obstructive pulmonary disease, unspecified: Secondary | ICD-10-CM | POA: Insufficient documentation

## 2017-10-24 DIAGNOSIS — R079 Chest pain, unspecified: Secondary | ICD-10-CM

## 2017-10-24 DIAGNOSIS — I10 Essential (primary) hypertension: Secondary | ICD-10-CM | POA: Insufficient documentation

## 2017-10-24 DIAGNOSIS — E119 Type 2 diabetes mellitus without complications: Secondary | ICD-10-CM | POA: Insufficient documentation

## 2017-10-24 DIAGNOSIS — I2510 Atherosclerotic heart disease of native coronary artery without angina pectoris: Secondary | ICD-10-CM | POA: Insufficient documentation

## 2017-10-24 DIAGNOSIS — R600 Localized edema: Secondary | ICD-10-CM | POA: Insufficient documentation

## 2017-10-24 DIAGNOSIS — F419 Anxiety disorder, unspecified: Secondary | ICD-10-CM | POA: Insufficient documentation

## 2017-10-24 LAB — MYOCARDIAL PERFUSION IMAGING
CHL CUP NUCLEAR SDS: 0
CHL CUP NUCLEAR SRS: 2
CSEPPHR: 88 {beats}/min
LHR: 0.34
LV dias vol: 155 mL (ref 62–150)
LV sys vol: 77 mL
Rest HR: 60 {beats}/min
SSS: 2
TID: 1.05

## 2017-10-24 MED ORDER — TECHNETIUM TC 99M TETROFOSMIN IV KIT
30.6000 | PACK | Freq: Once | INTRAVENOUS | Status: AC | PRN
  Administered 2017-10-24: 30.6 via INTRAVENOUS
  Filled 2017-10-24: qty 31

## 2017-10-24 MED ORDER — ADENOSINE (DIAGNOSTIC) 3 MG/ML IV SOLN
0.5600 mg/kg | Freq: Once | INTRAVENOUS | Status: AC
  Administered 2017-10-24: 51 mg via INTRAVENOUS

## 2017-10-24 MED ORDER — TECHNETIUM TC 99M TETROFOSMIN IV KIT
11.0000 | PACK | Freq: Once | INTRAVENOUS | Status: AC | PRN
  Administered 2017-10-24: 11 via INTRAVENOUS
  Filled 2017-10-24: qty 11

## 2017-11-06 NOTE — Progress Notes (Signed)
Cardiology Office Note:    Date:  11/07/2017   ID:  [name] Slade Sr., DOB [birth year], MRN [id number]  PCP:  Charlott Rakes, MD  Cardiologist:  Shirlee More, MD    Referring MD: Charlott Rakes, MD    ASSESSMENT:    1. Coronary artery calcification seen on CT scan   2. Essential hypertension   3. Other emphysema ([place])    PLAN:    In order of problems listed above:  1. Stable, he has no evidence of flow-limiting stenosis on perfusion study and with his peripheral edema and orthostatic symptoms and drop in blood pressure.  His calcium channel blocker.  He is having muscle symptoms and I have switched him to rosuvastatin with his coronary artery calcification 2. Blood pressure today at target 140/80 with a 20 mm drop standing he is having bothersome peripheral edema related to calcium channel blocker will discontinue start low-dose thiazide diuretic check renal function in 2 weeks and his wife will start checking home blood pressures sit and stand and if he is greater than 403 systolic I will place him on an ARB.  I am hesitant to start a second agent as the effects of calcium channel blockers can persist for weeks after discontinuation 3. Stable   Next appointment: One month for titration of blood pressure medication   Medication Adjustments/Labs and Tests Ordered: Current medicines are reviewed at length with the patient today.  Concerns regarding medicines are outlined above.  No orders of the defined types were placed in this encounter.  No orders of the defined types were placed in this encounter.   Chief Complaint  Patient presents with  . Follow-up    after echocardiogram and lexiscan myoview    History of Present Illness:    [name] Helwig Sr. is a 57 y.o. male with a hx of CAC and chest pain last seen 10/08/17.  ASSESSMENT:    10/10/17   1. Coronary artery calcification seen on CT scan   2. Angina pectoris ([place])   3. Essential hypertension   4. Chest pain,  unspecified type   5. Localized edema    PLAN:     1. He has coronary artery calcification and typical angina.  For further evaluation he will undergo pharmacologic myocardial perfusion study, I would have preferred to have done cardiac CTA but I do not want to wait several weeks for the results.  He was given a prescription for nitroglycerin to use if needed we will continue his current antianginal therapy with a calcium channel blocker we will not start a beta-blocker at this bronchospasm continue his statin and I will ask his neurosurgeon if he can start low-dose aspirin.  If symptoms worsen or high risk findings he will need coronary angiography. 2. See above 3. Continue current treatment await results of myocardial perfusion study his edema is mild and related to calcium channel blocker 4. See discussion above myocardial perfusion study ordered 5. Related to calcium channel blocker continue for the time being as I am hesitant to give him a beta-blocker with bronchospasm    Compliance with diet, lifestyle and medications: Yes  He is reassured by his normal myocardial perfusion study and cardiac echo he is having worsened dependent edema with increase in dose of his calcium channel blocker orthostatic lightheadedness and muscle weakness and his wife and RN is concerned with related to medications. Past Medical History:  Diagnosis Date  . Anxiety   . CAD (coronary artery disease) 10/06/2017  . COPD (  chronic obstructive pulmonary disease) ([place]) 09/29/2017  . Cough   . Depression   . Dyspnea   . 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 Hypertension 09/29/2017  . Myelopathy ([place]) 09/14/2017  . Tobacco abuse 09/29/2017  . Type 2 diabetes mellitus ([place]) 10/17/2011    Past Surgical History:  Procedure Laterality Date  . ANTERIOR CERVICAL DECOMP/DISCECTOMY FUSION N/A 09/14/2017   Procedure: ANTERIOR CERVICAL DECOMPRESSION/DISCECTOMY FUSIONCERVICAL 4- CERVICAL 5, CERVICAL 5- CERVICAL 6;  Surgeon: Kary Kos, MD;  Location: [facility];  Service: Neurosurgery;  Laterality: N/A;  ANTERIOR CERVICAL DECOMPRESSION/DISCECTOMY FUSIONCERVICAL 4- CERVICAL 5, CERVICAL 5- CERVICAL 6    Current Medications: No outpatient medications have been marked as taking for the 11/07/17 encounter (Office Visit) with [name] Priest, MD.     Allergies:   Patient has no known allergies.   Social History   Socioeconomic History  . Marital status: Single    Spouse name: Not on file  . Number of children: Not on file  . Years of education: Not on file  . Highest education level: Not on file  Occupational History  . Not on file  Social Needs  . Financial resource strain: Not on file  . Food insecurity:    Worry: Not on file    Inability: Not on file  . Transportation needs:    Medical: Not on file    Non-medical: Not on file  Tobacco Use  . Smoking status: Current Every Day Smoker    Packs/day: 2.00    Years: 42.00    Pack years: 84.00  . Smokeless tobacco: Never Used  Substance and Sexual Activity  . Alcohol use: Yes  . Drug use: No  . Sexual activity: Not on file  Lifestyle  . Physical activity:    Days per week: Not on file    Minutes per session: Not on file  . Stress: Not on file  Relationships  . Social connections:    Talks on phone: Not on file    Gets together: Not on file    Attends religious service: Not on file    Active member of club or organization: Not on file    Attends meetings of clubs or organizations: Not on file    Relationship status: Not on file  Other Topics Concern  . Not on file  Social History Narrative  . Not on file     Family History: The patient's family history includes Diabetes in his mother; Hypertension in his brother; Kidney cancer in his father; Lung cancer in his father; Scoliosis in his mother. ROS:   Please see the history of present illness.    All other systems reviewed and are negative.  EKGs/Labs/Other Studies Reviewed:    The following  studies were reviewed today:  Echo TTE 10/24/17: Study Conclusions - Left ventricle: The cavity size was normal. Systolic function was   normal. The estimated ejection fraction was in the range of 55%   to 60%. Wall motion was normal; there were no regional wall   motion abnormalities. The study is not technically sufficient to   allow evaluation of LV diastolic function. - Aortic valve: Transvalvular velocity was within the normal range.   There was no stenosis. There was no regurgitation. - Mitral valve: Transvalvular velocity was within the normal range.   There was no evidence for stenosis. There was trivial   regurgitation. - Right ventricle: The cavity size was normal. Wall thickness was   normal. Systolic function was normal. -  Tricuspid valve: There was trivial regurgitation. - Pulmonary arteries: Systolic pressure was within the normal   range. PA peak pressure: 34 mm Hg (S).  MPI 10/24/17: Study Highlights    Nuclear stress EF: 50%.  There was no ST segment deviation noted during stress.  The study is normal.  This is a low risk study.  The left ventricular ejection fraction is mildly decreased (45-54%). Attenuation of inferior wall due to diaphragm and small bowel loop No ischemia EF estimated at 50% with apical hypokinesis suggest Echo or MRI correlation    Recent Labs: 10/02/2017: ALT 18 10/17/2017: BUN 21; Creatinine, Ser 1.56; Hemoglobin 15.4; Magnesium 2.0; Platelets 256; Potassium 4.8; Sodium 132  Recent Lipid Panel    Component Value Date/Time   CHOL 325 (H) 10/02/2017 0947   TRIG 123 10/02/2017 0947   HDL 64 10/02/2017 0947   CHOLHDL 5.1 (H) 10/02/2017 0947   LDLCALC 236 (H) 10/02/2017 0947    Physical Exam:    VS:  BP 128/78 (BP Location: Right Arm, Patient Position: Sitting, Cuff Size: Large)   Pulse 80   Ht 6\' 2"  (1.88 m)   Wt 204 lb (92.5 kg)   SpO2 98%   BMI 26.19 kg/m     Wt Readings from Last 3 Encounters:  11/07/17 204 lb (92.5 kg)   10/24/17 201 lb (91.2 kg)  10/18/17 201 lb (91.2 kg)     GEN:  Well nourished, well developed in no acute distress HEENT: Normal NECK: No JVD; No carotid bruits LYMPHATICS: No lymphadenopathy CARDIAC:RRR, no murmurs, rubs, gallops RESPIRATORY:  Clear to auscultation without rales, wheezing or rhonchi  ABDOMEN: Soft, non-tender, non-distended MUSCULOSKELETAL: He has 2-3+ doughy dependent edema predominantly at the ankles edema; No deformity  SKIN: Warm and dry NEUROLOGIC:  Alert and oriented x 3 PSYCHIATRIC:  Normal affect    Signed, Shirlee More, MD  11/07/2017 3:21 PM    [facility]

## 2017-11-07 ENCOUNTER — Ambulatory Visit (INDEPENDENT_AMBULATORY_CARE_PROVIDER_SITE_OTHER): Payer: Self-pay | Admitting: Cardiology

## 2017-11-07 ENCOUNTER — Encounter: Payer: Self-pay | Admitting: Cardiology

## 2017-11-07 VITALS — BP 128/78 | HR 80 | Ht 74.0 in | Wt 204.0 lb

## 2017-11-07 DIAGNOSIS — G629 Polyneuropathy, unspecified: Secondary | ICD-10-CM

## 2017-11-07 DIAGNOSIS — I10 Essential (primary) hypertension: Secondary | ICD-10-CM

## 2017-11-07 DIAGNOSIS — I2510 Atherosclerotic heart disease of native coronary artery without angina pectoris: Secondary | ICD-10-CM

## 2017-11-07 DIAGNOSIS — J438 Other emphysema: Secondary | ICD-10-CM

## 2017-11-07 MED ORDER — MELOXICAM 15 MG PO TABS
15.0000 mg | ORAL_TABLET | Freq: Every day | ORAL | 3 refills | Status: DC
Start: 2017-11-07 — End: 2017-11-07

## 2017-11-07 MED ORDER — MELOXICAM 15 MG PO TABS: 15.0000 mg | ORAL_TABLET | Freq: Every day | ORAL | 3 refills | Status: DC

## 2017-11-07 MED ORDER — ROSUVASTATIN CALCIUM 10 MG PO TABS: 10.0000 mg | ORAL_TABLET | Freq: Every day | ORAL | 11 refills | Status: DC

## 2017-11-07 MED ORDER — HYDROCHLOROTHIAZIDE 12.5 MG PO CAPS: 12.5000 mg | ORAL_CAPSULE | Freq: Every day | ORAL | 11 refills | Status: DC

## 2017-11-07 NOTE — Patient Instructions (Signed)
Medication Instructions:  Your physician has recommended you make the following change in your medication:  STOP atorvastatin STOP amlodipine  START hydrochlorothiazide 12.5 mg daily START rosuvastatin (Crestor) 10 mg daily  Check your blood pressure at home daily and record. Bring to next office visit. Call if blood pressures become greater than 145 on top number.  Labwork: Your physician recommends that you return for lab work in: 2 weeks. BMP  Testing/Procedures: None  Follow-Up: Your physician recommends that you schedule a follow-up appointment in: 4 weeks  Any Other Special Instructions Will Be Listed Below (If Applicable).     If you need a refill on your cardiac medications before your next appointment, please call your pharmacy.

## 2017-11-17 ENCOUNTER — Telehealth: Payer: Self-pay | Admitting: Family Medicine

## 2017-11-17 MED ORDER — LEVETIRACETAM 500 MG PO TABS: 500.0000 mg | ORAL_TABLET | Freq: Two times a day (BID) | ORAL | 1 refills | Status: DC

## 2017-11-17 NOTE — Telephone Encounter (Signed)
Pt called to request a medication refill on  -levETIRAcetam (KEPPRA) 500 MG tablet  To -[id number] - [place], [place] - [address] Please follow up

## 2017-11-17 NOTE — Telephone Encounter (Signed)
Refilled

## 2017-11-21 ENCOUNTER — Telehealth: Payer: Self-pay

## 2017-11-21 NOTE — Telephone Encounter (Signed)
Spoke with patient to remind him that he needs repeat BMP this week per Dr Bettina Gavia.  Patient will come to our office for lab draw.  Patient agrees to plan, and verbalized understanding.

## 2017-11-22 ENCOUNTER — Ambulatory Visit: Payer: Self-pay | Attending: Family Medicine | Admitting: Family Medicine

## 2017-11-22 ENCOUNTER — Encounter: Payer: Self-pay | Admitting: Family Medicine

## 2017-11-22 VITALS — BP 120/77 | HR 70 | Temp 97.8°F | Ht 74.0 in | Wt 201.0 lb

## 2017-11-22 DIAGNOSIS — Z7984 Long term (current) use of oral hypoglycemic drugs: Secondary | ICD-10-CM | POA: Insufficient documentation

## 2017-11-22 DIAGNOSIS — J438 Other emphysema: Secondary | ICD-10-CM

## 2017-11-22 DIAGNOSIS — R251 Tremor, unspecified: Secondary | ICD-10-CM | POA: Insufficient documentation

## 2017-11-22 DIAGNOSIS — I10 Essential (primary) hypertension: Secondary | ICD-10-CM | POA: Insufficient documentation

## 2017-11-22 DIAGNOSIS — I2510 Atherosclerotic heart disease of native coronary artery without angina pectoris: Secondary | ICD-10-CM | POA: Insufficient documentation

## 2017-11-22 DIAGNOSIS — F32A Depression, unspecified: Secondary | ICD-10-CM

## 2017-11-22 DIAGNOSIS — Z9889 Other specified postprocedural states: Secondary | ICD-10-CM | POA: Insufficient documentation

## 2017-11-22 DIAGNOSIS — J439 Emphysema, unspecified: Secondary | ICD-10-CM | POA: Insufficient documentation

## 2017-11-22 DIAGNOSIS — E1165 Type 2 diabetes mellitus with hyperglycemia: Secondary | ICD-10-CM | POA: Insufficient documentation

## 2017-11-22 DIAGNOSIS — Z791 Long term (current) use of non-steroidal anti-inflammatories (NSAID): Secondary | ICD-10-CM | POA: Insufficient documentation

## 2017-11-22 DIAGNOSIS — F419 Anxiety disorder, unspecified: Secondary | ICD-10-CM | POA: Insufficient documentation

## 2017-11-22 DIAGNOSIS — Z79899 Other long term (current) drug therapy: Secondary | ICD-10-CM | POA: Insufficient documentation

## 2017-11-22 DIAGNOSIS — F329 Major depressive disorder, single episode, unspecified: Secondary | ICD-10-CM | POA: Insufficient documentation

## 2017-11-22 LAB — GLUCOSE, POCT (MANUAL RESULT ENTRY): POC Glucose: 182 mg/dl — AB (ref 70–99)

## 2017-11-22 NOTE — Progress Notes (Signed)
Subjective:  Patient ID: [name] Slade Sr., male    DOB: 06/26/1961  Age: 57 y.o. MRN: [id number]  CC: Diabetes and Hypertension   HPI [name] Meinders Sr. is a 57 year old male with a medical history of of type 2 diabetes mellitus (A1c 6.8), hypertension, alcohol abuse, coronary artery calcifications, ACDF of C4,5 and C5,6 on 09/14/17  By Dr Saintclair Halsted secondary to Myelopathy who presents to the clinic for a follow-up visit. He was seen for new onset seizures at his last office visit and has been on Keppra and denies recent seizures.  The plan was to refer him to neurology and he was supposed to apply for the [facility] health discount which he is yet to do.  He denies any recent seizures. He was seen by cardiology 2 weeks ago due to the presence of coronary artery calcifications seen on CT and he underwent a nuclear stress test which was low risk.  His medications were adjusted and he was commenced on Crestor and placed on hydrochlorothiazide for hypertension due to the presence of pedal edema with amlodipine. He is accompanied by his mom today and continues to feel depressed due to his medical conditions but informs me his Cymbalta helps his anxiety and depression and he also has reduction of his tremors.  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  Denies suicidal ideations. He saw his neurosurgeon yesterday.  Past Medical History:  Diagnosis Date  . Anxiety   . CAD (coronary artery disease) 10/06/2017  . COPD (chronic obstructive pulmonary disease) ([place]) 09/29/2017  . Cough   . Depression   . Dyspnea   . 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 Hypertension 09/29/2017  . Myelopathy ([place]) 09/14/2017  . Tobacco abuse 09/29/2017  . Type 2 diabetes mellitus ([place]) 10/17/2011    Past Surgical History:  Procedure Laterality Date  . ANTERIOR CERVICAL DECOMP/DISCECTOMY FUSION N/A  09/14/2017   Procedure: ANTERIOR CERVICAL DECOMPRESSION/DISCECTOMY FUSIONCERVICAL 4- CERVICAL 5, CERVICAL 5- CERVICAL 6;  Surgeon: [name] Kos, MD;  Location: [facility];  Service: Neurosurgery;  Laterality: N/A;  ANTERIOR CERVICAL DECOMPRESSION/DISCECTOMY FUSIONCERVICAL 4- CERVICAL 5, CERVICAL 5- CERVICAL 6    No Known Allergies   Outpatient Medications Prior to Visit  Medication Sig Dispense Refill  . acetaminophen (TYLENOL) 500 MG tablet Take 1,000 mg by mouth every 6 (six) hours as needed.    Marland Kitchen albuterol (PROVENTIL HFA;VENTOLIN HFA) 108 (90 Base) MCG/ACT inhaler Inhale 2 puffs into the lungs every 6 (six) hours as needed for wheezing or shortness of breath. 1 Inhaler 0  . cetirizine (ZYRTEC) 10 MG tablet Take 10 mg by mouth daily.    . DULoxetine (CYMBALTA) 60 MG capsule Take 1 capsule (60 mg total) by mouth daily. 30 capsule 3  . gabapentin (NEURONTIN) 300 MG capsule Take 1 capsule (300 mg total) by mouth 2 (two) times daily. 60 capsule 3  . hydrochlorothiazide (MICROZIDE) 12.5 MG capsule Take 1 capsule (12.5 mg total) by mouth daily. 30 capsule 11  . levETIRAcetam (KEPPRA) 500 MG tablet Take 1 tablet (500 mg total) by mouth 2 (two) times daily. 60 tablet 1  . meloxicam (MOBIC) 15 MG tablet Take 1 tablet (15 mg total) by mouth daily. Take 1 daily with food. 30 tablet 3  . metFORMIN (GLUCOPHAGE) 500 MG tablet Take 1 tablet (  500 mg total) by mouth daily with breakfast. 30 tablet 3  . nicotine (NICODERM CQ) 21 mg/24hr patch Place 1 patch (21 mg total) onto the skin daily. 28 patch 0  . nitroGLYCERIN (NITROSTAT) 0.4 MG SL tablet Place 1 tablet (0.4 mg total) under the tongue every 5 (five) minutes as needed for chest pain. 25 tablet 11  . rosuvastatin (CRESTOR) 10 MG tablet Take 1 tablet (10 mg total) by mouth daily. 30 tablet 11   No facility-administered medications prior to visit.     ROS Review of Systems  Constitutional: Negative for activity change and appetite change.  HENT: Negative for  sinus pressure and sore throat.   Eyes: Negative for visual disturbance.  Respiratory: Negative for cough, chest tightness and shortness of breath.   Cardiovascular: Negative for chest pain and leg swelling.  Gastrointestinal: Negative for abdominal distention, abdominal pain, constipation and diarrhea.  Endocrine: Negative.   Genitourinary: Negative for dysuria.  Musculoskeletal: Negative for joint swelling and myalgias.  Skin: Negative for rash.  Allergic/Immunologic: Negative.   Neurological: Negative for weakness, light-headedness and numbness.       Positive for tremors  Psychiatric/Behavioral: Positive for dysphoric mood. Negative for suicidal ideas.    Objective:  BP 120/77   Pulse 70   Temp 97.8 F (36.6 C) (Oral)   Ht 6\' 2"  (1.88 m)   Wt 201 lb (91.2 kg)   SpO2 96%   BMI 25.81 kg/m   BP/Weight 11/22/2017 11/07/2017 2/99/2426  Systolic BP 834 196 222  Diastolic BP 77 78 93  Wt. (Lbs) 201 204 201  BMI 25.81 26.19 25.81     Physical Exam  Constitutional: He is oriented to person, place, and time. He appears well-developed and well-nourished.  Cardiovascular: Normal rate, normal heart sounds and intact distal pulses.  No murmur heard. Pulmonary/Chest: Effort normal and breath sounds normal. He has no wheezes. He has no rales. He exhibits no tenderness.  Abdominal: Soft. Bowel sounds are normal. He exhibits no distension and no mass. There is no tenderness.  Musculoskeletal: Normal range of motion.  Neurological: He is alert and oriented to person, place, and time.  Psychiatric:  Dysphoric mood     CMP Latest Ref Rng & Units 10/17/2017 10/02/2017 09/09/2017  Glucose 65 - 99 mg/dL 153(H) 132(H) 151(H)  BUN 6 - 20 mg/dL 21(H) 20 19  Creatinine 0.61 - 1.24 mg/dL 1.56(H) 1.34(H) 1.37(H)  Sodium 135 - 145 mmol/L 132(L) 137 136  Potassium 3.5 - 5.1 mmol/L 4.8 6.0(H) 4.9  Chloride 101 - 111 mmol/L 101 101 99(L)  CO2 22 - 32 mmol/L 22 23 25   Calcium 8.9 - 10.3 mg/dL  8.5(L) 9.1 8.9  Total Protein 6.0 - 8.5 g/dL - 5.2(L) -  Total Bilirubin 0.0 - 1.2 mg/dL - <0.2 -  Alkaline Phos 39 - 117 IU/L - 62 -  AST 0 - 40 IU/L - 16 -  ALT 0 - 44 IU/L - 18 -    Lab Results  Component Value Date   HGBA1C 6.8 09/29/2017    Assessment & Plan:   1. Type 2 diabetes mellitus with hyperglycemia, without long-term current use of insulin (HCC) Controlled with A1c of 6.8 Continue current regimen - POCT glucose (manual entry)  2. Anxiety and depression Due to underlying medical conditions Currently on Cymbalta Declines initiation of Wellbutrin  3. Other emphysema ([place]) Controlled Advised to work on smoking cessation  4. Essential hypertension Controlled Continue hydrochlorothiazide,  5.  Coronary artery calcification on  CT Low risk nuclear this test Continue Crestor Risk factor modification  No orders of the defined types were placed in this encounter.   Follow-up: Return in about 3 months (around 02/22/2018) for follow Up of chronic medical conditions.   Charlott Rakes MD

## 2017-11-23 ENCOUNTER — Encounter: Payer: Self-pay | Admitting: Family Medicine

## 2017-11-25 LAB — BASIC METABOLIC PANEL
BUN / CREAT RATIO: 12 (ref 9–20)
BUN: 14 mg/dL (ref 6–24)
CHLORIDE: 94 mmol/L — AB (ref 96–106)
CO2: 20 mmol/L (ref 20–29)
CREATININE: 1.21 mg/dL (ref 0.76–1.27)
Calcium: 8.7 mg/dL (ref 8.7–10.2)
GFR calc Af Amer: 76 mL/min/{1.73_m2} (ref >59)
GFR calc non Af Amer: 66 mL/min/{1.73_m2} (ref >59)
GLUCOSE: 148 mg/dL — AB (ref 65–99)
POTASSIUM: 4.9 mmol/L (ref 3.5–5.2)
SODIUM: 129 mmol/L — AB (ref 134–144)

## 2017-11-27 ENCOUNTER — Telehealth: Payer: Self-pay

## 2017-11-27 NOTE — Telephone Encounter (Signed)
-----   Message from Richardo Priest, MD sent at 11/25/2017 11:35 AM EDT ----- Low Na stop his diuretic

## 2017-11-27 NOTE — Telephone Encounter (Signed)
Patient notified of low sodium level per Dr Bettina Gavia.  Patient was advised to stop HCTZ per Dr Bettina Gavia, and keep follow up appointment as planned.  Patient verbalized understanding.

## 2017-12-01 NOTE — Progress Notes (Signed)
Cardiology Office Note:    Date:  12/05/2017   ID:  [name] Slade Sr., DOB 12/17/1960, MRN [id number]  PCP:  Charlott Rakes, MD  Cardiologist:  Shirlee More, MD    Referring MD: Charlott Rakes, MD    ASSESSMENT:    1. Essential hypertension   2. Coronary artery calcification seen on CT scan   3. Hyperlipidemia, unspecified hyperlipidemia type    PLAN:    In order of problems listed above:  1. Improved I will have him take a low-dose of a ARB as needed for systolics greater than 235 at this time does not have orthostatic hypotension or edema and is off of his diuretic 2. Stable he has had an ischemic evaluation 3. Improved continue his statin   Next appointment: 6 months   Medication Adjustments/Labs and Tests Ordered: Current medicines are reviewed at length with the patient today.  Concerns regarding medicines are outlined above.  No orders of the defined types were placed in this encounter.  No orders of the defined types were placed in this encounter.   Chief Complaint  Patient presents with  . Follow-up    History of Present Illness:    [name] Mattioli Sr. is a 57 y.o. male with a hx of CAC and chest pain last seen 11/07/17.  ASSESSMENT:    11/07/17    1. Coronary artery calcification seen on CT scan   2. Essential hypertension   3. Other emphysema ([place])    PLAN:    1. Stable, he has no evidence of flow-limiting stenosis on perfusion study and with his peripheral edema and orthostatic symptoms and drop in blood pressure.  His calcium channel blocker.  He is having muscle symptoms and I have switched him to rosuvastatin with his coronary artery calcification 2. Blood pressure today at target 140/80 with a 20 mm drop standing he is having bothersome peripheral edema related to calcium channel blocker will discontinue start low-dose thiazide diuretic check renal function in 2 weeks and his wife will start checking home blood pressures sit and stand and if he  is greater than 361 systolic I will place him on an ARB.  I am hesitant to start a second agent as the effects of calcium channel blockers can persist for weeks after discontinuation 3. Stable  Compliance with diet, lifestyle and medications: Yes  He is off of diuretic with orthostatic hypotension and hyponatremia.  His muscle strength is improved with a change in his statin.  He complains of being generally dizzy but is not orthostatic and he has no orthostatic shift and blood pressure in the office today.  I suspect this is side effect from his gabapentin Past Medical History:  Diagnosis Date  . Anxiety   . CAD (coronary artery disease) 10/06/2017  . COPD (chronic obstructive pulmonary disease) ([place]) 09/29/2017  . Cough   . Depression   . Dyspnea   . 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 Hypertension 09/29/2017  . Myelopathy ([place]) 09/14/2017  . Tobacco abuse 09/29/2017  . Type 2 diabetes mellitus ([place]) 10/17/2011    Past Surgical History:  Procedure Laterality Date  . ANTERIOR CERVICAL DECOMP/DISCECTOMY FUSION N/A 09/14/2017   Procedure: ANTERIOR CERVICAL DECOMPRESSION/DISCECTOMY FUSIONCERVICAL 4- CERVICAL 5, CERVICAL 5- CERVICAL 6;  Surgeon: Kary Kos, MD;  Location: [facility];  Service: Neurosurgery;  Laterality: N/A;  ANTERIOR CERVICAL DECOMPRESSION/DISCECTOMY FUSIONCERVICAL 4- CERVICAL 5, CERVICAL 5- CERVICAL 6    Current Medications: Current Meds  Medication Sig  . acetaminophen (TYLENOL) 500 MG tablet  Take 1,000 mg by mouth every 6 (six) hours as needed.  Marland Kitchen albuterol (PROVENTIL HFA;VENTOLIN HFA) 108 (90 Base) MCG/ACT inhaler Inhale 2 puffs into the lungs every 6 (six) hours as needed for wheezing or shortness of breath.  . cetirizine (ZYRTEC) 10 MG tablet Take 10 mg by mouth daily.  Marland Kitchen gabapentin (NEURONTIN) 300 MG capsule Take 1 capsule (300 mg total) by mouth 2 (two) times daily.  Marland Kitchen levETIRAcetam (KEPPRA) 500 MG tablet Take 1 tablet (500 mg total) by mouth 2 (two) times daily.  . metFORMIN  (GLUCOPHAGE) 500 MG tablet Take 1 tablet (500 mg total) by mouth daily with breakfast.  . nitroGLYCERIN (NITROSTAT) 0.4 MG SL tablet Place 1 tablet (0.4 mg total) under the tongue every 5 (five) minutes as needed for chest pain.     Allergies:   Patient has no known allergies.   Social History   Socioeconomic History  . Marital status: Single    Spouse name: Not on file  . Number of children: Not on file  . Years of education: Not on file  . Highest education level: Not on file  Occupational History  . Not on file  Social Needs  . Financial resource strain: Not on file  . Food insecurity:    Worry: Not on file    Inability: Not on file  . Transportation needs:    Medical: Not on file    Non-medical: Not on file  Tobacco Use  . Smoking status: Current Every Day Smoker    Packs/day: 2.00    Years: 42.00    Pack years: 84.00  . Smokeless tobacco: Never Used  Substance and Sexual Activity  . Alcohol use: Yes  . Drug use: No  . Sexual activity: Not on file  Lifestyle  . Physical activity:    Days per week: Not on file    Minutes per session: Not on file  . Stress: Not on file  Relationships  . Social connections:    Talks on phone: Not on file    Gets together: Not on file    Attends religious service: Not on file    Active member of club or organization: Not on file    Attends meetings of clubs or organizations: Not on file    Relationship status: Not on file  Other Topics Concern  . Not on file  Social History Narrative  . Not on file     Family History: The patient's family history includes Diabetes in his mother; Hypertension in his brother; Kidney cancer in his father; Lung cancer in his father; Scoliosis in his mother. ROS:   Please see the history of present illness.    All other systems reviewed and are negative.  EKGs/Labs/Other Studies Reviewed:    The following studies were reviewed today  MPI:  Study Highlights    Nuclear stress EF:  50%.  There was no ST segment deviation noted during stress.  The study is normal.  This is a low risk study.  The left ventricular ejection fraction is mildly decreased (45-54%). Attenuation of inferior wall due to diaphragm and small bowel loop No ischemia EF estimated at 50% with apical hypokinesis suggest Echo or MRI correlation    Stress Findings A pharmacological stress test was performed using IV adenosine at a rate of 140 mcg/kg/min for 4 minutesfor a total dose of 51.2mg  performed without concurrent submaximal exercise.  The patient reported flushing during the stress test. The patient experienced no angina during the  stress test.  Test was stopped per protocol.  Recovery time: 5 minutes.  Response to Stress There was no ST segment deviation noted during stress.  Arrhythmias during stress: none.  Arrhythmias during recovery: none.  There were no significant arrhythmias noted during the test.  ECG was uninterpretable.   Echo: - Left ventricle: The cavity size was normal. Systolic function was   normal. The estimated ejection fraction was in the range of 55%   to 60%. Wall motion was normal; there were no regional wall   motion abnormalities.  Recent Labs: 10/02/2017: ALT 18 10/17/2017: Hemoglobin 15.4; Magnesium 2.0; Platelets 256 11/24/2017: BUN 14; Creatinine, Ser 1.21; Potassium 4.9; Sodium 129  Recent Lipid Panel    Component Value Date/Time   CHOL 325 (H) 10/02/2017 0947   TRIG 123 10/02/2017 0947   HDL 64 10/02/2017 0947   CHOLHDL 5.1 (H) 10/02/2017 0947   LDLCALC 236 (H) 10/02/2017 0947    Physical Exam:    VS:  Wt 202 lb 1.9 oz (91.7 kg)   BMI 25.95 kg/m     Wt Readings from Last 3 Encounters:  12/05/17 202 lb 1.9 oz (91.7 kg)  11/22/17 201 lb (91.2 kg)  11/07/17 204 lb (92.5 kg)     GEN: He looks chronically ill he is tearful in the office  in no acute distress HEENT: Normal NECK: No JVD; No carotid bruits LYMPHATICS: No lymphadenopathy CARDIAC:  RRR, no murmurs, rubs, gallops RESPIRATORY:  Clear to auscultation without rales, wheezing or rhonchi  ABDOMEN: Soft, non-tender, non-distended MUSCULOSKELETAL:  No edema; No deformity  SKIN: Warm and dry NEUROLOGIC:  Alert and oriented x 3 PSYCHIATRIC:  Normal affect    Signed, Shirlee More, MD  12/05/2017 4:06 PM    [facility]

## 2017-12-05 ENCOUNTER — Ambulatory Visit (INDEPENDENT_AMBULATORY_CARE_PROVIDER_SITE_OTHER): Payer: Self-pay | Admitting: Cardiology

## 2017-12-05 ENCOUNTER — Encounter: Payer: Self-pay | Admitting: Cardiology

## 2017-12-05 VITALS — BP 126/80 | HR 77 | Ht 73.0 in | Wt 202.1 lb

## 2017-12-05 DIAGNOSIS — I10 Essential (primary) hypertension: Secondary | ICD-10-CM

## 2017-12-05 DIAGNOSIS — I2510 Atherosclerotic heart disease of native coronary artery without angina pectoris: Secondary | ICD-10-CM

## 2017-12-05 DIAGNOSIS — E785 Hyperlipidemia, unspecified: Secondary | ICD-10-CM

## 2017-12-05 MED ORDER — VALSARTAN 40 MG PO TABS: 40.0000 mg | ORAL_TABLET | Freq: Every day | ORAL | 11 refills | Status: DC | PRN

## 2017-12-05 NOTE — Patient Instructions (Signed)
Medication Instructions:  Your physician has recommended you make the following change in your medication:  START valsartan (Diovan) 40 mg daily as needed if blood pressure greater than 150 on top number.  Labwork: Your physician recommends that you have the following labs drawn: CMP, lipid  Testing/Procedures: None  Follow-Up: Your physician wants you to follow-up in: 6 months. You will receive a reminder letter in the mail two months in advance. If you don't receive a letter, please call our office to schedule the follow-up appointment.  Any Other Special Instructions Will Be Listed Below (If Applicable).     If you need a refill on your cardiac medications before your next appointment, please call your pharmacy.

## 2017-12-06 ENCOUNTER — Telehealth: Payer: Self-pay

## 2017-12-06 LAB — COMPREHENSIVE METABOLIC PANEL
A/G RATIO: 1.5 (ref 1.2–2.2)
ALK PHOS: 60 IU/L (ref 39–117)
ALT: 12 IU/L (ref 0–44)
AST: 17 IU/L (ref 0–40)
Albumin: 2.9 g/dL — ABNORMAL LOW (ref 3.5–5.5)
BILIRUBIN TOTAL: 0.2 mg/dL (ref 0.0–1.2)
BUN/Creatinine Ratio: 12 (ref 9–20)
BUN: 16 mg/dL (ref 6–24)
CHLORIDE: 103 mmol/L (ref 96–106)
CO2: 20 mmol/L (ref 20–29)
Calcium: 8.7 mg/dL (ref 8.7–10.2)
Creatinine, Ser: 1.31 mg/dL — ABNORMAL HIGH (ref 0.76–1.27)
GFR calc Af Amer: 69 mL/min/{1.73_m2} (ref >59)
GFR, EST NON AFRICAN AMERICAN: 60 mL/min/{1.73_m2} (ref >59)
GLOBULIN, TOTAL: 2 g/dL (ref 1.5–4.5)
Glucose: 126 mg/dL — ABNORMAL HIGH (ref 65–99)
POTASSIUM: 4.7 mmol/L (ref 3.5–5.2)
SODIUM: 136 mmol/L (ref 134–144)
Total Protein: 4.9 g/dL — ABNORMAL LOW (ref 6.0–8.5)

## 2017-12-06 LAB — LIPID PANEL
CHOLESTEROL TOTAL: 330 mg/dL — AB (ref 100–199)
Chol/HDL Ratio: 5 ratio (ref 0.0–5.0)
HDL: 66 mg/dL (ref >39)
LDL CALC: 243 mg/dL — AB (ref 0–99)
TRIGLYCERIDES: 107 mg/dL (ref 0–149)
VLDL CHOLESTEROL CAL: 21 mg/dL (ref 5–40)

## 2017-12-06 NOTE — Telephone Encounter (Signed)
Left message to return call for lab work.

## 2017-12-06 NOTE — Telephone Encounter (Signed)
-----   Message from Richardo Priest, MD sent at 12/06/2017  8:05 AM EDT ----- stable result, Na is normal do not salt oad  Is he taking a statin?

## 2017-12-07 NOTE — Telephone Encounter (Signed)
Patient advised of results. Advised not to add salt to his diet anymore. Patient is not currently taking his Crestor. Patient states it was too expensive and he is trying to get it switched to a pharmacy that will be cheaper. Patient does not wish to try any other medications at this time. Patient will return call if he has further issues with affording Crestor. Dr. Bettina Gavia made aware.

## 2018-01-11 ENCOUNTER — Ambulatory Visit: Payer: Self-pay | Attending: Physician Assistant | Admitting: Physician Assistant

## 2018-01-11 VITALS — BP 155/80 | HR 98 | Temp 98.2°F | Resp 18 | Ht 74.0 in | Wt 214.0 lb

## 2018-01-11 DIAGNOSIS — F419 Anxiety disorder, unspecified: Secondary | ICD-10-CM | POA: Insufficient documentation

## 2018-01-11 DIAGNOSIS — E119 Type 2 diabetes mellitus without complications: Secondary | ICD-10-CM

## 2018-01-11 DIAGNOSIS — I2510 Atherosclerotic heart disease of native coronary artery without angina pectoris: Secondary | ICD-10-CM | POA: Insufficient documentation

## 2018-01-11 DIAGNOSIS — I10 Essential (primary) hypertension: Secondary | ICD-10-CM | POA: Insufficient documentation

## 2018-01-11 DIAGNOSIS — G629 Polyneuropathy, unspecified: Secondary | ICD-10-CM | POA: Insufficient documentation

## 2018-01-11 DIAGNOSIS — Z7984 Long term (current) use of oral hypoglycemic drugs: Secondary | ICD-10-CM | POA: Insufficient documentation

## 2018-01-11 DIAGNOSIS — F329 Major depressive disorder, single episode, unspecified: Secondary | ICD-10-CM | POA: Insufficient documentation

## 2018-01-11 DIAGNOSIS — Z79899 Other long term (current) drug therapy: Secondary | ICD-10-CM | POA: Insufficient documentation

## 2018-01-11 DIAGNOSIS — R82998 Other abnormal findings in urine: Secondary | ICD-10-CM

## 2018-01-11 DIAGNOSIS — M7989 Other specified soft tissue disorders: Secondary | ICD-10-CM | POA: Insufficient documentation

## 2018-01-11 DIAGNOSIS — R809 Proteinuria, unspecified: Secondary | ICD-10-CM | POA: Insufficient documentation

## 2018-01-11 DIAGNOSIS — J449 Chronic obstructive pulmonary disease, unspecified: Secondary | ICD-10-CM | POA: Insufficient documentation

## 2018-01-11 DIAGNOSIS — R569 Unspecified convulsions: Secondary | ICD-10-CM | POA: Insufficient documentation

## 2018-01-11 DIAGNOSIS — E1165 Type 2 diabetes mellitus with hyperglycemia: Secondary | ICD-10-CM | POA: Insufficient documentation

## 2018-01-11 LAB — POCT URINALYSIS DIPSTICK
Bilirubin, UA: NEGATIVE
Glucose, UA: NEGATIVE
KETONES UA: NEGATIVE
Leukocytes, UA: NEGATIVE
NITRITE UA: NEGATIVE
PH UA: 6 (ref 5.0–8.0)
PROTEIN UA: POSITIVE — AB
SPEC GRAV UA: 1.015 (ref 1.010–1.025)
UROBILINOGEN UA: 0.2 U/dL

## 2018-01-11 LAB — GLUCOSE, POCT (MANUAL RESULT ENTRY): POC GLUCOSE: 155 mg/dL — AB (ref 70–99)

## 2018-01-11 MED ORDER — ALBUTEROL SULFATE HFA 108 (90 BASE) MCG/ACT IN AERS: 2.0000 | INHALATION_SPRAY | Freq: Four times a day (QID) | RESPIRATORY_TRACT | 0 refills | Status: DC | PRN

## 2018-01-11 MED ORDER — FUROSEMIDE 20 MG PO TABS: ORAL_TABLET | ORAL | 5 refills | Status: DC

## 2018-01-11 MED ORDER — METFORMIN HCL 500 MG PO TABS: 500.0000 mg | ORAL_TABLET | Freq: Every day | ORAL | 3 refills | Status: DC

## 2018-01-11 MED ORDER — GABAPENTIN 300 MG PO CAPS: 300.0000 mg | ORAL_CAPSULE | Freq: Two times a day (BID) | ORAL | 3 refills | Status: DC

## 2018-01-11 MED ORDER — LEVETIRACETAM 500 MG PO TABS: 500.0000 mg | ORAL_TABLET | Freq: Two times a day (BID) | ORAL | 1 refills | Status: DC

## 2018-01-11 NOTE — Patient Instructions (Signed)
DASH Eating Plan DASH stands for "Dietary Approaches to Stop Hypertension." The DASH eating plan is a healthy eating plan that has been shown to reduce high blood pressure (hypertension). It may also reduce your risk for type 2 diabetes, heart disease, and stroke. The DASH eating plan may also help with weight loss. What are tips for following this plan? General guidelines  Avoid eating more than 2,300 mg (milligrams) of salt (sodium) a day. If you have hypertension, you may need to reduce your sodium intake to 1,500 mg a day.  Limit alcohol intake to no more than 1 drink a day for nonpregnant women and 2 drinks a day for men. One drink equals 12 oz of beer, 5 oz of wine, or 1 oz of hard liquor.  Work with your health care provider to maintain a healthy body weight or to lose weight. Ask what an ideal weight is for you.  Get at least 30 minutes of exercise that causes your heart to beat faster (aerobic exercise) most days of the week. Activities may include walking, swimming, or biking.  Work with your health care provider or diet and nutrition specialist (dietitian) to adjust your eating plan to your individual calorie needs. Reading food labels  Check food labels for the amount of sodium per serving. Choose foods with less than 5 percent of the Daily Value of sodium. Generally, foods with less than 300 mg of sodium per serving fit into this eating plan.  To find whole grains, look for the word "whole" as the first word in the ingredient list. Shopping  Buy products labeled as "low-sodium" or "no salt added."  Buy fresh foods. Avoid canned foods and premade or frozen meals. Cooking  Avoid adding salt when cooking. Use salt-free seasonings or herbs instead of table salt or sea salt. Check with your health care provider or pharmacist before using salt substitutes.  Do not fry foods. Cook foods using healthy methods such as baking, boiling, grilling, and broiling instead.  Cook with  heart-healthy oils, such as olive, canola, soybean, or sunflower oil. Meal planning   Eat a balanced diet that includes: ? 5 or more servings of fruits and vegetables each day. At each meal, try to fill half of your plate with fruits and vegetables. ? Up to 6-8 servings of whole grains each day. ? Less than 6 oz of lean meat, poultry, or fish each day. A 3-oz serving of meat is about the same size as a deck of cards. One egg equals 1 oz. ? 2 servings of low-fat dairy each day. ? A serving of nuts, seeds, or beans 5 times each week. ? Heart-healthy fats. Healthy fats called Omega-3 fatty acids are found in foods such as flaxseeds and coldwater fish, like sardines, salmon, and mackerel.  Limit how much you eat of the following: ? Canned or prepackaged foods. ? Food that is high in trans fat, such as fried foods. ? Food that is high in saturated fat, such as fatty meat. ? Sweets, desserts, sugary drinks, and other foods with added sugar. ? Full-fat dairy products.  Do not salt foods before eating.  Try to eat at least 2 vegetarian meals each week.  Eat more home-cooked food and less restaurant, buffet, and fast food.  When eating at a restaurant, ask that your food be prepared with less salt or no salt, if possible. What foods are recommended? The items listed may not be a complete list. Talk with your dietitian about what   dietary choices are best for you. Grains Whole-grain or whole-wheat bread. Whole-grain or whole-wheat pasta. Brown rice. Oatmeal. Quinoa. Bulgur. Whole-grain and low-sodium cereals. Pita bread. Low-fat, low-sodium crackers. Whole-wheat flour tortillas. Vegetables Fresh or frozen vegetables (raw, steamed, roasted, or grilled). Low-sodium or reduced-sodium tomato and vegetable juice. Low-sodium or reduced-sodium tomato sauce and tomato paste. Low-sodium or reduced-sodium canned vegetables. Fruits All fresh, dried, or frozen fruit. Canned fruit in natural juice (without  added sugar). Meat and other protein foods Skinless chicken or turkey. Ground chicken or turkey. Pork with fat trimmed off. Fish and seafood. Egg whites. Dried beans, peas, or lentils. Unsalted nuts, nut butters, and seeds. Unsalted canned beans. Lean cuts of beef with fat trimmed off. Low-sodium, lean deli meat. Dairy Low-fat (1%) or fat-free (skim) milk. Fat-free, low-fat, or reduced-fat cheeses. Nonfat, low-sodium ricotta or cottage cheese. Low-fat or nonfat yogurt. Low-fat, low-sodium cheese. Fats and oils Soft margarine without trans fats. Vegetable oil. Low-fat, reduced-fat, or light mayonnaise and salad dressings (reduced-sodium). Canola, safflower, olive, soybean, and sunflower oils. Avocado. Seasoning and other foods Herbs. Spices. Seasoning mixes without salt. Unsalted popcorn and pretzels. Fat-free sweets. What foods are not recommended? The items listed may not be a complete list. Talk with your dietitian about what dietary choices are best for you. Grains Baked goods made with fat, such as croissants, muffins, or some breads. Dry pasta or rice meal packs. Vegetables Creamed or fried vegetables. Vegetables in a cheese sauce. Regular canned vegetables (not low-sodium or reduced-sodium). Regular canned tomato sauce and paste (not low-sodium or reduced-sodium). Regular tomato and vegetable juice (not low-sodium or reduced-sodium). Pickles. Olives. Fruits Canned fruit in a light or heavy syrup. Fried fruit. Fruit in cream or butter sauce. Meat and other protein foods Fatty cuts of meat. Ribs. Fried meat. Bacon. Sausage. Bologna and other processed lunch meats. Salami. Fatback. Hotdogs. Bratwurst. Salted nuts and seeds. Canned beans with added salt. Canned or smoked fish. Whole eggs or egg yolks. Chicken or turkey with skin. Dairy Whole or 2% milk, cream, and half-and-half. Whole or full-fat cream cheese. Whole-fat or sweetened yogurt. Full-fat cheese. Nondairy creamers. Whipped toppings.  Processed cheese and cheese spreads. Fats and oils Butter. Stick margarine. Lard. Shortening. Ghee. Bacon fat. Tropical oils, such as coconut, palm kernel, or palm oil. Seasoning and other foods Salted popcorn and pretzels. Onion salt, garlic salt, seasoned salt, table salt, and sea salt. Worcestershire sauce. Tartar sauce. Barbecue sauce. Teriyaki sauce. Soy sauce, including reduced-sodium. Steak sauce. Canned and packaged gravies. Fish sauce. Oyster sauce. Cocktail sauce. Horseradish that you find on the shelf. Ketchup. Mustard. Meat flavorings and tenderizers. Bouillon cubes. Hot sauce and Tabasco sauce. Premade or packaged marinades. Premade or packaged taco seasonings. Relishes. Regular salad dressings. Where to find more information:  National Heart, Lung, and Blood Institute: www.nhlbi.nih.gov  American Heart Association: www.heart.org Summary  The DASH eating plan is a healthy eating plan that has been shown to reduce high blood pressure (hypertension). It may also reduce your risk for type 2 diabetes, heart disease, and stroke.  With the DASH eating plan, you should limit salt (sodium) intake to 2,300 mg a day. If you have hypertension, you may need to reduce your sodium intake to 1,500 mg a day.  When on the DASH eating plan, aim to eat more fresh fruits and vegetables, whole grains, lean proteins, low-fat dairy, and heart-healthy fats.  Work with your health care provider or diet and nutrition specialist (dietitian) to adjust your eating plan to your individual   calorie needs. This information is not intended to replace advice given to you by your health care provider. Make sure you discuss any questions you have with your health care provider. Document Released: 06/02/2011 Document Revised: 06/06/2016 Document Reviewed: 06/06/2016 Elsevier Interactive Patient Education  2018 [employer]. Edema Edema is when you have too much fluid in your body or under your skin. Edema may make  your legs, feet, and ankles swell up. Swelling is also common in looser tissues, like around your eyes. This is a common condition. It gets more common as you get older. There are many possible causes of edema. Eating too much salt (sodium) and being on your feet or sitting for a long time can cause edema in your legs, feet, and ankles. Hot weather may make edema worse. Edema is usually painless. Your skin may look swollen or shiny. Follow these instructions at home:  Keep the swollen body part raised (elevated) above the level of your heart when you are sitting or lying down.  Do not sit still or stand for a long time.  Do not wear tight clothes. Do not wear garters on your upper legs.  Exercise your legs. This can help the swelling go down.  Wear elastic bandages or support stockings as told by your doctor.  Eat a low-salt (low-sodium) diet to reduce fluid as told by your doctor.  Depending on the cause of your swelling, you may need to limit how much fluid you drink (fluid restriction).  Take over-the-counter and prescription medicines only as told by your doctor. Contact a doctor if:  Treatment is not working.  You have heart, liver, or kidney disease and have symptoms of edema.  You have sudden and unexplained weight gain. Get help right away if:  You have shortness of breath or chest pain.  You cannot breathe when you lie down.  You have pain, redness, or warmth in the swollen areas.  You have heart, liver, or kidney disease and get edema all of a sudden.  You have a fever and your symptoms get worse all of a sudden. Summary  Edema is when you have too much fluid in your body or under your skin.  Edema may make your legs, feet, and ankles swell up. Swelling is also common in looser tissues, like around your eyes.  Raise (elevate) the swollen body part above the level of your heart when you are sitting or lying down.  Follow your doctor's instructions about diet and  how much fluid you can drink (fluid restriction). This information is not intended to replace advice given to you by your health care provider. Make sure you discuss any questions you have with your health care provider. Document Released: 11/30/2007 Document Revised: 07/01/2016 Document Reviewed: 07/01/2016 Elsevier Interactive Patient Education  2017 [employer].

## 2018-01-11 NOTE — Progress Notes (Signed)
Patient ID: [name] Cullens Sr., male   DOB: [birth year], 57 y.o.   MRN: [id number]     [name] [name], is a 58 y.o. male  IRJ:[id number]  [id number]  DOB - Feb 13, 1961  Subjective:  Chief Complaint and HPI: [name] [name] is a 57 y.o. male here today with lower leg swelling.  Recent sodium abnormalities and was taken off HCTZ a few weeks ago.  C/o worsening leg swelling X 3 weeks.  Drinks about a 6 pack beer daily.  Denies new CP, SOB.  Also c/o frothy urine for a "long time."  His gf is with him and interjects at times and gives some history.  He has taken about 4 days worth of someone else's lasix at 40mg  without any relief. Admits to poor compliance with diabetes medications.  ROS:   Constitutional:  No f/c, No night sweats, No unexplained weight loss. EENT:  No vision changes, No blurry vision, No hearing changes. No mouth, throat, or ear problems.  Respiratory: No cough, No SOB Cardiac: No CP, no palpitations GI:  No abd pain, No N/V/D. GU: +frothy urine w/o dysuria Musculoskeletal: No joint pain Neuro: No headache, no dizziness, no motor weakness.  Skin: No rash Endocrine:  No polydipsia. No polyuria.  Psych: Denies SI/HI  No problems updated.  ALLERGIES: No Known Allergies  PAST MEDICAL HISTORY: Past Medical History:  Diagnosis Date  . Anxiety   . CAD (coronary artery disease) 10/06/2017  . COPD (chronic obstructive pulmonary disease) ([place]) 09/29/2017  . Cough   . Depression   . Dyspnea   . 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 Hypertension 09/29/2017  . Myelopathy ([place]) 09/14/2017  . Tobacco abuse 09/29/2017  . Type 2 diabetes mellitus ([place]) 10/17/2011    MEDICATIONS AT HOME: Prior to Admission medications   Medication Sig Start Date End Date Taking? Authorizing Provider  acetaminophen (TYLENOL) 500 MG tablet Take 1,000 mg by mouth every 6 (six) hours as needed.    [provider]  albuterol (PROVENTIL HFA;VENTOLIN HFA) 108 (90 Base) MCG/ACT inhaler Inhale 2 puffs  into the lungs every 6 (six) hours as needed for wheezing or shortness of breath. 01/11/18   Argentina Donovan, PA-C  cetirizine (ZYRTEC) 10 MG tablet Take 10 mg by mouth daily.    [provider]  furosemide (LASIX) 20 MG tablet Take 1 bid X 5 days then 1 daily 01/11/18   Argentina Donovan, PA-C  gabapentin (NEURONTIN) 300 MG capsule Take 1 capsule (300 mg total) by mouth 2 (two) times daily. 01/11/18   Argentina Donovan, PA-C  levETIRAcetam (KEPPRA) 500 MG tablet Take 1 tablet (500 mg total) by mouth 2 (two) times daily. 01/11/18 02/10/18  Argentina Donovan, PA-C  metFORMIN (GLUCOPHAGE) 500 MG tablet Take 1 tablet (500 mg total) by mouth daily with breakfast. 01/11/18   Argentina Donovan, PA-C  nitroGLYCERIN (NITROSTAT) 0.4 MG SL tablet Place 1 tablet (0.4 mg total) under the tongue every 5 (five) minutes as needed for chest pain. 10/10/17 01/08/18  Richardo Priest, MD  rosuvastatin (CRESTOR) 10 MG tablet Take 1 tablet (10 mg total) by mouth daily. Patient not taking: Reported on 12/05/2017 11/07/17 02/05/18  Richardo Priest, MD  valsartan (DIOVAN) 40 MG tablet Take 1 tablet (40 mg total) by mouth daily as needed. For blood pressure greater than 150 on the top number. 12/05/17   Richardo Priest, MD     Objective:  EXAM:   Vitals:   01/11/18 1354  BP: Marland Kitchen)  155/80  Pulse: 98  Resp: 18  Temp: 98.2 F (36.8 C)  TempSrc: Oral  SpO2: 98%  Weight: 214 lb (97.1 kg)  Height: 6\' 2"  (1.88 m)    General appearance : A&OX3. NAD. Non-toxic-appearing, walks with cane.  12 pounds heavier than when here about 6 weeks ago.   HEENT: Atraumatic and Normocephalic.  PERRLA. EOM intact.  TM clear B. Mouth-MMM, post pharynx WNL w/o erythema, No PND. Neck: supple, no JVD. No cervical lymphadenopathy. No thyromegaly Chest/Lungs:  Breathing-non-labored, Good air entry bilaterally, breath sounds normal without rales, rhonchi, or wheezing  CVS: S1 S2 regular, no murmurs, gallops, rubs  Abdomen: Bowel sounds  present, Non tender and not distended with no gaurding, rigidity or rebound.  No ascites/fluid-wave Extremities: Bilateral Lower Ext shows 2-3+ edema, both legs are warm to touch with = pulse throughout Neurology:  CN II-XII grossly intact, Non focal.   Psych:  TP linear. J/I WNL. Normal speech. Appropriate eye contact and affect.  Skin:  No Rash  Data Review Lab Results  Component Value Date   HGBA1C 6.8 09/29/2017   HGBA1C 6.4 10/17/2011     Assessment & Plan   1. Type 2 diabetes mellitus with hyperglycemia, without long-term current use of insulin (HCC) Uncontrolled, non-compliant with meds.  Take meds - Glucose (CBG) - metFORMIN (GLUCOPHAGE) 500 MG tablet; Take 1 tablet (500 mg total) by mouth daily with breakfast.  Dispense: 30 tablet; Refill: 3 - Comprehensive metabolic panel  2. Essential hypertension Not at goal-will start lasix to diurese and for BP control - Comprehensive metabolic panel - furosemide (LASIX) 20 MG tablet; Take 1 bid X 5 days then 1 daily  Dispense: 30 tablet; Refill: 5  3. Leg swelling Likely due to recently being taken off HCTZ.  DASH diet - Comprehensive metabolic panel - furosemide (LASIX) 20 MG tablet; Take 1 bid X 5 days then 1 daily  Dispense: 30 tablet; Refill: 5  4. Neuropathy - gabapentin (NEURONTIN) 300 MG capsule; Take 1 capsule (300 mg total) by mouth 2 (two) times daily.  Dispense: 60 capsule; Refill: 3  5. Frothy urine Known proteinuria - Urinalysis Dipstick  6. Seizure ([place]) stable - levETIRAcetam (KEPPRA) 500 MG tablet; Take 1 tablet (500 mg total) by mouth 2 (two) times daily.  Dispense: 60 tablet; Refill: 1  7.  Daily alcohol consumption-alcohol cessation advised.     Patient have been counseled extensively about nutrition and exercise  Return for keep 02/21/2018 appt with Dr Margarita Rana.  The patient was given clear instructions to go to ER or return to medical center if symptoms don't improve, worsen or new problems develop.  The patient verbalized understanding. The patient was told to call to get lab results if they haven't heard anything in the next week.     Freeman Caldron, PA-C [facility] and [place], [place]   01/11/2018, 2:09 PM

## 2018-01-12 LAB — COMPREHENSIVE METABOLIC PANEL
ALK PHOS: 67 IU/L (ref 39–117)
ALT: 14 IU/L (ref 0–44)
AST: 18 IU/L (ref 0–40)
Albumin/Globulin Ratio: 1.2 (ref 1.2–2.2)
Albumin: 2.8 g/dL — ABNORMAL LOW (ref 3.5–5.5)
BUN/Creatinine Ratio: 12 (ref 9–20)
BUN: 15 mg/dL (ref 6–24)
Bilirubin Total: 0.2 mg/dL (ref 0.0–1.2)
CO2: 19 mmol/L — AB (ref 20–29)
CREATININE: 1.29 mg/dL — AB (ref 0.76–1.27)
Calcium: 8.9 mg/dL (ref 8.7–10.2)
Chloride: 99 mmol/L (ref 96–106)
GFR calc Af Amer: 70 mL/min/{1.73_m2} (ref >59)
GFR calc non Af Amer: 61 mL/min/{1.73_m2} (ref >59)
GLUCOSE: 127 mg/dL — AB (ref 65–99)
Globulin, Total: 2.3 g/dL (ref 1.5–4.5)
Potassium: 5.5 mmol/L — ABNORMAL HIGH (ref 3.5–5.2)
Sodium: 133 mmol/L — ABNORMAL LOW (ref 134–144)
Total Protein: 5.1 g/dL — ABNORMAL LOW (ref 6.0–8.5)

## 2018-01-13 ENCOUNTER — Other Ambulatory Visit: Payer: Self-pay | Admitting: Family Medicine

## 2018-01-16 ENCOUNTER — Telehealth: Payer: Self-pay | Admitting: *Deleted

## 2018-01-16 NOTE — Telephone Encounter (Signed)
-----   Message from Argentina Donovan, [place] sent at 01/12/2018 12:19 PM EDT ----- Please call patient. Sodium is a little low again and potassium is a little high.  Take meds as directed.  Do not drink alcohol.  Follow-up as planned. Thanks, Freeman Caldron, PA-C

## 2018-01-16 NOTE — Telephone Encounter (Signed)
Patient verified DOB Patient is aware of sodium being a little low and potassium being a little high. Patient is aware of needing to take medications as prescribed and do not drink alcohol. Patient advised to follow up as planned.

## 2018-02-15 ENCOUNTER — Other Ambulatory Visit: Payer: Self-pay | Admitting: Family Medicine

## 2018-02-15 ENCOUNTER — Other Ambulatory Visit: Payer: Self-pay | Admitting: Physician Assistant

## 2018-02-15 DIAGNOSIS — G629 Polyneuropathy, unspecified: Secondary | ICD-10-CM

## 2018-02-15 DIAGNOSIS — R569 Unspecified convulsions: Secondary | ICD-10-CM

## 2018-02-21 ENCOUNTER — Ambulatory Visit: Payer: Self-pay | Admitting: Family Medicine

## 2018-03-12 ENCOUNTER — Encounter: Payer: Self-pay | Admitting: Family Medicine

## 2018-03-12 ENCOUNTER — Ambulatory Visit: Payer: Self-pay | Attending: Family Medicine | Admitting: Family Medicine

## 2018-03-12 ENCOUNTER — Ambulatory Visit: Payer: Self-pay | Attending: Family Medicine | Admitting: Licensed Clinical Social Worker

## 2018-03-12 ENCOUNTER — Other Ambulatory Visit: Payer: Self-pay

## 2018-03-12 VITALS — BP 149/83 | HR 69 | Temp 98.7°F | Resp 18 | Ht 73.0 in | Wt 209.2 lb

## 2018-03-12 DIAGNOSIS — Z79899 Other long term (current) drug therapy: Secondary | ICD-10-CM | POA: Insufficient documentation

## 2018-03-12 DIAGNOSIS — R600 Localized edema: Secondary | ICD-10-CM | POA: Insufficient documentation

## 2018-03-12 DIAGNOSIS — F419 Anxiety disorder, unspecified: Secondary | ICD-10-CM

## 2018-03-12 DIAGNOSIS — G629 Polyneuropathy, unspecified: Secondary | ICD-10-CM

## 2018-03-12 DIAGNOSIS — R569 Unspecified convulsions: Secondary | ICD-10-CM | POA: Insufficient documentation

## 2018-03-12 DIAGNOSIS — Z981 Arthrodesis status: Secondary | ICD-10-CM | POA: Insufficient documentation

## 2018-03-12 DIAGNOSIS — I10 Essential (primary) hypertension: Secondary | ICD-10-CM | POA: Insufficient documentation

## 2018-03-12 DIAGNOSIS — F32A Depression, unspecified: Secondary | ICD-10-CM

## 2018-03-12 DIAGNOSIS — Z7984 Long term (current) use of oral hypoglycemic drugs: Secondary | ICD-10-CM | POA: Insufficient documentation

## 2018-03-12 DIAGNOSIS — J438 Other emphysema: Secondary | ICD-10-CM | POA: Insufficient documentation

## 2018-03-12 DIAGNOSIS — F17200 Nicotine dependence, unspecified, uncomplicated: Secondary | ICD-10-CM | POA: Insufficient documentation

## 2018-03-12 DIAGNOSIS — F329 Major depressive disorder, single episode, unspecified: Secondary | ICD-10-CM | POA: Insufficient documentation

## 2018-03-12 DIAGNOSIS — E1140 Type 2 diabetes mellitus with diabetic neuropathy, unspecified: Secondary | ICD-10-CM | POA: Insufficient documentation

## 2018-03-12 DIAGNOSIS — E119 Type 2 diabetes mellitus without complications: Secondary | ICD-10-CM

## 2018-03-12 DIAGNOSIS — I2510 Atherosclerotic heart disease of native coronary artery without angina pectoris: Secondary | ICD-10-CM | POA: Insufficient documentation

## 2018-03-12 DIAGNOSIS — F331 Major depressive disorder, recurrent, moderate: Secondary | ICD-10-CM

## 2018-03-12 LAB — POCT GLYCOSYLATED HEMOGLOBIN (HGB A1C)
HEMOGLOBIN A1C: 6.6 % — AB (ref 4.0–5.6)
HbA1c POC (<> result, manual entry): 6.6 % (ref 4.0–5.6)
HbA1c, POC (controlled diabetic range): 6.6 % (ref 0.0–7.0)
HbA1c, POC (prediabetic range): 6.6 % — AB (ref 5.7–6.4)

## 2018-03-12 LAB — GLUCOSE, POCT (MANUAL RESULT ENTRY): POC Glucose: 141 mg/dl — AB (ref 70–99)

## 2018-03-12 MED ORDER — GABAPENTIN 300 MG PO CAPS: 300.0000 mg | ORAL_CAPSULE | Freq: Two times a day (BID) | ORAL | 3 refills | Status: DC

## 2018-03-12 MED ORDER — FUROSEMIDE 40 MG PO TABS: 40.0000 mg | ORAL_TABLET | Freq: Every day | ORAL | 0 refills | Status: DC

## 2018-03-12 MED ORDER — LEVETIRACETAM 500 MG PO TABS: 500.0000 mg | ORAL_TABLET | Freq: Two times a day (BID) | ORAL | 3 refills | Status: DC

## 2018-03-12 MED ORDER — CITALOPRAM HYDROBROMIDE 20 MG PO TABS: 20.0000 mg | ORAL_TABLET | Freq: Every day | ORAL | 3 refills | Status: DC

## 2018-03-12 MED ORDER — HYDROXYZINE HCL 25 MG PO TABS: 25.0000 mg | ORAL_TABLET | Freq: Three times a day (TID) | ORAL | 1 refills | Status: DC | PRN

## 2018-03-12 MED ORDER — METFORMIN HCL 500 MG PO TABS: 500.0000 mg | ORAL_TABLET | Freq: Every day | ORAL | 3 refills | Status: DC

## 2018-03-12 MED ORDER — FLUTICASONE-SALMETEROL 250-50 MCG/DOSE IN AEPB: 1.0000 | INHALATION_SPRAY | Freq: Two times a day (BID) | RESPIRATORY_TRACT | 3 refills | Status: DC

## 2018-03-12 MED ORDER — ALBUTEROL SULFATE HFA 108 (90 BASE) MCG/ACT IN AERS: 2.0000 | INHALATION_SPRAY | Freq: Four times a day (QID) | RESPIRATORY_TRACT | 0 refills | Status: DC | PRN

## 2018-03-12 NOTE — Patient Instructions (Signed)
Edema Edema is when you have too much fluid in your body or under your skin. Edema may make your legs, feet, and ankles swell up. Swelling is also common in looser tissues, like around your eyes. This is a common condition. It gets more common as you get older. There are many possible causes of edema. Eating too much salt (sodium) and being on your feet or sitting for a long time can cause edema in your legs, feet, and ankles. Hot weather may make edema worse. Edema is usually painless. Your skin may look swollen or shiny. Follow these instructions at home:  Keep the swollen body part raised (elevated) above the level of your heart when you are sitting or lying down.  Do not sit still or stand for a long time.  Do not wear tight clothes. Do not wear garters on your upper legs.  Exercise your legs. This can help the swelling go down.  Wear elastic bandages or support stockings as told by your doctor.  Eat a low-salt (low-sodium) diet to reduce fluid as told by your doctor.  Depending on the cause of your swelling, you may need to limit how much fluid you drink (fluid restriction).  Take over-the-counter and prescription medicines only as told by your doctor. Contact a doctor if:  Treatment is not working.  You have heart, liver, or kidney disease and have symptoms of edema.  You have sudden and unexplained weight gain. Get help right away if:  You have shortness of breath or chest pain.  You cannot breathe when you lie down.  You have pain, redness, or warmth in the swollen areas.  You have heart, liver, or kidney disease and get edema all of a sudden.  You have a fever and your symptoms get worse all of a sudden. Summary  Edema is when you have too much fluid in your body or under your skin.  Edema may make your legs, feet, and ankles swell up. Swelling is also common in looser tissues, like around your eyes.  Raise (elevate) the swollen body part above the level of your  heart when you are sitting or lying down.  Follow your doctor's instructions about diet and how much fluid you can drink (fluid restriction). This information is not intended to replace advice given to you by your health care provider. Make sure you discuss any questions you have with your health care provider. Document Released: 11/30/2007 Document Revised: 07/01/2016 Document Reviewed: 07/01/2016 Elsevier Interactive Patient Education  2017 [employer] Inc[employer]  

## 2018-03-12 NOTE — BH Specialist Note (Signed)
Integrated Behavioral Health Initial Visit  MRN: [id number] Name: [name] Flahive Sr.  Number of [place] Clinician visits:: 1/6 Session Start time: 2:15 PM  Session End time: 2:40 PM Total time: 25 minutes  Type of Service: [place] Interpretor:No. Interpretor Name and Language: N/A   Warm Hand Off Completed.       SUBJECTIVE: [name] Rhett Sr. is a 57 y.o. male accompanied by Partner/Significant Other Patient was referred by Dr. Margarita Rana for depression and anxiety. Patient reports the following symptoms/concerns: feelings of sadness, anger, and worry, difficulty sleeping, feeling bad about self, and decreased concentration Duration of problem: 6 months; Severity of problem: severe  OBJECTIVE: Mood: Anxious and Depressed and Affect: Depressed Risk of harm to self or others: No plan to harm self or others Pt denies current SI/HI/AVH  LIFE CONTEXT: Family and Social: Pt receives strong support from significant other School/Work: Pt was recently awarded disability. He is in the process of filing for medicaid to cover his medical bills Self-Care: Pt is open to medication management Life Changes: Pt has ongoing medical conditions. He is trying to cope with the recent incarceration of his son, who was using substances and engaging in threatening behavior while residing with pt  GOALS ADDRESSED: Patient will: 1. Reduce symptoms of: anxiety, depression and stress 2. Increase knowledge and/or ability of: coping skills, healthy habits and stress reduction  3. Demonstrate ability to: Increase healthy adjustment to current life circumstances, Increase adequate support systems for patient/family and Improve medication compliance  INTERVENTIONS: Interventions utilized: Mindfulness or Psychologist, educational, Supportive Counseling and Link to [employer]  Standardized Assessments completed: GAD-7 and PHQ 2&9  ASSESSMENT: Patient  currently experiencing depression and anxiety triggered by ongoing medical conditions. He is trying to cope with the recent incarceration of his son, who was using substances and engaging in threatening behavior while residing with pt. He reports feelings of sadness, anger, and worry, difficulty sleeping, feeling bad about self, and decreased concentration. Pt receives strong support from significant other, who was present during visit.   Patient may benefit from psychotherapy and medication management. [place] educated pt on the side effects of trauma. Pt's feelings were validated and support was provided. Strategies with coping were discussed to assist in management and/or decrease of symptoms. Pt is open to medication management.   Pt was provided supportive resources to assist with food insecurity. LCSWA provided pt with the name of his Development worker, community at [employer] to assist him with outstanding medical bills.   PLAN: 1. Follow up with behavioral health clinician on : Pt was encouraged to contact LCSWA if symptoms worsen or fail to improve to schedule behavioral appointments at [facility]. 2. Behavioral recommendations: LCSWA recommends that pt apply healthy coping skills discussed, comply with medication management, and utilize provided resources. Pt is encouraged to schedule follow up appointment with LCSWA 3. Referral(s): [place] (In Clinic) and [employer] Resources:  Presenter, broadcasting 4. "From scale of 1-10, how likely are you to follow plan?":   Rebekah [place], LCSW 03/13/18 9:29 AM

## 2018-03-12 NOTE — Progress Notes (Signed)
Flu shot: no Swelling in legs/feet  Patient states he is very depressed  Albuterol refill

## 2018-03-12 NOTE — Progress Notes (Signed)
Subjective:  Patient ID: [name] Slade Sr., male    DOB: [birth year]  Age: 57 y.o. MRN: [id number]  CC: Follow-up   HPI [name] Gancarz Sr.  is a 57 year old male with a medical history of of type 2 diabetes mellitus (A1c 6.6), Seizures, hypertension, alcohol abuse, coronary artery calcifications, ACDF of C4,5 and C5,6 on 09/14/17  By Dr Saintclair Halsted secondary to Myelopathy who presents to the clinic for a follow-up visit. He denies recent seizures and has been compliant with Keppra. Doing well on metformin and denies hypoglycemia or visual concerns but complains of numbness and tingling in his feet with associated burning. He denies chest pains but endorses pedal edema for which he had been placed on Lasix 20 mg at his last visit with the PA. His emphysema has been uncontrolled as he does have some shortness of breath and uses his albuterol frequently and he continues to smoke.  He complains of being depressed and was previously on Zoloft which he discontinued due to ineffectiveness.  His depression stems from the fact that his son who was released from prison to live with him reverted back to using drugs and was subsequently arrested by the police and will be jailed for the rest of his life this has him distress in addition to his chronic medical conditions but he denies suicidal ideations or intents.  Informs me he did well on Celexa and Xanax in the past.  Past Medical History:  Diagnosis Date  . Anxiety   . CAD (coronary artery disease) 10/06/2017  . COPD (chronic obstructive pulmonary disease) ([place]) 09/29/2017  . Cough   . Depression   . Dyspnea   . 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 Hypertension 09/29/2017  . Myelopathy ([place]) 09/14/2017  . Tobacco abuse 09/29/2017  . Type 2 diabetes mellitus ([place]) 10/17/2011    Past Surgical History:  Procedure Laterality Date  . ANTERIOR CERVICAL DECOMP/DISCECTOMY FUSION N/A 09/14/2017   Procedure: ANTERIOR CERVICAL DECOMPRESSION/DISCECTOMY FUSIONCERVICAL 4-  CERVICAL 5, CERVICAL 5- CERVICAL 6;  Surgeon: Kary Kos, MD;  Location: [facility];  Service: Neurosurgery;  Laterality: N/A;  ANTERIOR CERVICAL DECOMPRESSION/DISCECTOMY FUSIONCERVICAL 4- CERVICAL 5, CERVICAL 5- CERVICAL 6    No Known Allergies   Outpatient Medications Prior to Visit  Medication Sig Dispense Refill  . acetaminophen (TYLENOL) 500 MG tablet Take 1,000 mg by mouth every 6 (six) hours as needed.    . valsartan (DIOVAN) 40 MG tablet Take 1 tablet (40 mg total) by mouth daily as needed. For blood pressure greater than 150 on the top number. 30 tablet 11  . albuterol (PROVENTIL HFA;VENTOLIN HFA) 108 (90 Base) MCG/ACT inhaler Inhale 2 puffs into the lungs every 6 (six) hours as needed for wheezing or shortness of breath. 1 Inhaler 0  . furosemide (LASIX) 20 MG tablet Take 1 bid X 5 days then 1 daily 30 tablet 5  . gabapentin (NEURONTIN) 300 MG capsule Take 1 capsule (300 mg total) by mouth 2 (two) times daily. 60 capsule 3  . metFORMIN (GLUCOPHAGE) 500 MG tablet Take 1 tablet (500 mg total) by mouth daily with breakfast. 30 tablet 3  . cetirizine (ZYRTEC) 10 MG tablet Take 10 mg by mouth daily.    . nitroGLYCERIN (NITROSTAT) 0.4 MG SL tablet Place 1 tablet (0.4 mg total) under the tongue every 5 (five) minutes as needed for chest pain. 25 tablet 11  . rosuvastatin (CRESTOR) 10 MG tablet Take 1 tablet (10 mg total) by mouth daily. (Patient not taking: Reported  on 12/05/2017) 30 tablet 11  . levETIRAcetam (KEPPRA) 500 MG tablet Take 1 tablet (500 mg total) by mouth 2 (two) times daily. 60 tablet 1   No facility-administered medications prior to visit.     ROS Review of Systems  Constitutional: Negative for activity change and appetite change.  HENT: Negative for sinus pressure and sore throat.   Eyes: Negative for visual disturbance.  Respiratory: Positive for shortness of breath. Negative for cough and chest tightness.   Cardiovascular: Negative for chest pain and leg swelling.    Gastrointestinal: Negative for abdominal distention, abdominal pain, constipation and diarrhea.  Endocrine: Negative.   Genitourinary: Negative for dysuria.  Musculoskeletal: Negative for joint swelling and myalgias.  Skin: Negative for rash.  Allergic/Immunologic: Negative.   Neurological: Positive for numbness. Negative for weakness and light-headedness.  Psychiatric/Behavioral: Positive for dysphoric mood. Negative for suicidal ideas.    Objective:  BP (!) 149/83   Pulse 69   Temp 98.7 F (37.1 C) (Oral)   Resp 18   Ht '6\' 1"'$  (1.854 m)   Wt 209 lb 3.2 oz (94.9 kg)   SpO2 95%   BMI 27.60 kg/m   BP/Weight 03/12/2018 01/11/2018 9/92/4268  Systolic BP 341 962 229  Diastolic BP 83 80 80  Wt. (Lbs) 209.2 214 202.12  BMI 27.6 27.48 26.67      Physical Exam  Constitutional: He is oriented to person, place, and time. He appears well-developed and well-nourished.  Cardiovascular: Normal rate, normal heart sounds and intact distal pulses.  No murmur heard. Pulmonary/Chest: Effort normal and breath sounds normal. He has no wheezes. He has no rales. He exhibits no tenderness.  Abdominal: Soft. Bowel sounds are normal. He exhibits no distension and no mass. There is no tenderness.  Musculoskeletal: Normal range of motion.  Neurological: He is alert and oriented to person, place, and time.  Skin: Skin is warm and dry.  Psychiatric:  Dysphoric mood     Assessment & Plan:   1. Type 2 diabetes mellitus without complication, without long-term current use of insulin (HCC) Controlled with A1c of 6.6 Continue current regimen Counseled on Diabetic diet, my plate method, 798 minutes of moderate intensity exercise/week Keep blood sugar logs with fasting goals of 80-120 mg/dl, random of less than 180 and in the event of sugars less than 60 mg/dl or greater than 400 mg/dl please notify the clinic ASAP. It is recommended that you undergo annual eye exams and annual foot exams. Pneumonia  vaccine is recommended. - Glucose (CBG) - HgB A1c - Microalbumin/Creatinine Ratio, Urine - CMP14+EGFR - metFORMIN (GLUCOPHAGE) 500 MG tablet; Take 1 tablet (500 mg total) by mouth daily with breakfast.  Dispense: 30 tablet; Refill: 3 - Basic Metabolic Panel; Future  2. Seizure ([place]) Controlled - levETIRAcetam (KEPPRA) 500 MG tablet; Take 1 tablet (500 mg total) by mouth 2 (two) times daily.  Dispense: 60 tablet; Refill: 3  3. Neuropathy Commenced on gabapentin - gabapentin (NEURONTIN) 300 MG capsule; Take 1 capsule (300 mg total) by mouth 2 (two) times daily.  Dispense: 60 capsule; Refill: 3  4. Essential hypertension Slightly elevated No regimen change today Counseled on Diabetic diet, my plate method, 921 minutes of moderate intensity exercise/week Keep blood sugar logs with fasting goals of 80-120 mg/dl, random of less than 180 and in the event of sugars less than 60 mg/dl or greater than 400 mg/dl please notify the clinic ASAP. It is recommended that you undergo annual eye exams and annual foot exams. Pneumonia  vaccine is recommended.   5. Pedal edema Likely due to hypoalbuminemia Advised against alcohol consumption to prevent development of cirrhosis Low-sodium diet, elevate feet Increase Lasix dose for the next 1 month and will repeat labs in 2 weeks - furosemide (LASIX) 40 MG tablet; Take 1 tablet (40 mg total) by mouth daily.  Dispense: 30 tablet; Refill: 0  6. Anxiety and depression Uncontrolled; exacerbated by ongoing family stresses Comment Celexa and hydroxyzine LCSW called in for counseling - citalopram (CELEXA) 20 MG tablet; Take 1 tablet (20 mg total) by mouth daily.  Dispense: 30 tablet; Refill: 3 - hydrOXYzine (ATARAX/VISTARIL) 25 MG tablet; Take 1 tablet (25 mg total) by mouth 3 (three) times daily as needed.  Dispense: 60 tablet; Refill: 1  7. Other emphysema ([place]) Uncontrolled Advair added to regimen Smoking cessation - albuterol (PROVENTIL HFA;VENTOLIN  HFA) 108 (90 Base) MCG/ACT inhaler; Inhale 2 puffs into the lungs every 6 (six) hours as needed for wheezing or shortness of breath.  Dispense: 1 Inhaler; Refill: 0 - Fluticasone-Salmeterol (ADVAIR DISKUS) 250-50 MCG/DOSE AEPB; Inhale 1 puff into the lungs 2 (two) times daily.  Dispense: 1 each; Refill: 3   Meds ordered this encounter  Medications  . citalopram (CELEXA) 20 MG tablet    Sig: Take 1 tablet (20 mg total) by mouth daily.    Dispense:  30 tablet    Refill:  3  . metFORMIN (GLUCOPHAGE) 500 MG tablet    Sig: Take 1 tablet (500 mg total) by mouth daily with breakfast.    Dispense:  30 tablet    Refill:  3  . levETIRAcetam (KEPPRA) 500 MG tablet    Sig: Take 1 tablet (500 mg total) by mouth 2 (two) times daily.    Dispense:  60 tablet    Refill:  3  . gabapentin (NEURONTIN) 300 MG capsule    Sig: Take 1 capsule (300 mg total) by mouth 2 (two) times daily.    Dispense:  60 capsule    Refill:  3  . albuterol (PROVENTIL HFA;VENTOLIN HFA) 108 (90 Base) MCG/ACT inhaler    Sig: Inhale 2 puffs into the lungs every 6 (six) hours as needed for wheezing or shortness of breath.    Dispense:  1 Inhaler    Refill:  0  . hydrOXYzine (ATARAX/VISTARIL) 25 MG tablet    Sig: Take 1 tablet (25 mg total) by mouth 3 (three) times daily as needed.    Dispense:  60 tablet    Refill:  1  . furosemide (LASIX) 40 MG tablet    Sig: Take 1 tablet (40 mg total) by mouth daily.    Dispense:  30 tablet    Refill:  0  . Fluticasone-Salmeterol (ADVAIR DISKUS) 250-50 MCG/DOSE AEPB    Sig: Inhale 1 puff into the lungs 2 (two) times daily.    Dispense:  1 each    Refill:  3    Follow-up: Return in about 3 months (around 06/11/2018) for Follow-up of chronic medical conditions.   Charlott Rakes MD

## 2018-03-13 LAB — CMP14+EGFR
ALT: 15 IU/L (ref 0–44)
AST: 17 IU/L (ref 0–40)
Albumin/Globulin Ratio: 1.2 (ref 1.2–2.2)
Albumin: 2.9 g/dL — ABNORMAL LOW (ref 3.5–5.5)
Alkaline Phosphatase: 68 IU/L (ref 39–117)
BUN/Creatinine Ratio: 12 (ref 9–20)
BUN: 15 mg/dL (ref 6–24)
Bilirubin Total: 0.2 mg/dL (ref 0.0–1.2)
CO2: 21 mmol/L (ref 20–29)
CREATININE: 1.28 mg/dL — AB (ref 0.76–1.27)
Calcium: 8.9 mg/dL (ref 8.7–10.2)
Chloride: 98 mmol/L (ref 96–106)
GFR calc non Af Amer: 61 mL/min/{1.73_m2} (ref >59)
GFR, EST AFRICAN AMERICAN: 71 mL/min/{1.73_m2} (ref >59)
GLOBULIN, TOTAL: 2.4 g/dL (ref 1.5–4.5)
Glucose: 134 mg/dL — ABNORMAL HIGH (ref 65–99)
Potassium: 5.4 mmol/L — ABNORMAL HIGH (ref 3.5–5.2)
SODIUM: 132 mmol/L — AB (ref 134–144)
TOTAL PROTEIN: 5.3 g/dL — AB (ref 6.0–8.5)

## 2018-03-13 LAB — MICROALBUMIN / CREATININE URINE RATIO
Creatinine, Urine: 28.5 mg/dL
MICROALBUM., U, RANDOM: 1227.9 ug/mL
Microalb/Creat Ratio: 4308.4 mg/g creat — ABNORMAL HIGH (ref 0.0–30.0)

## 2018-04-04 ENCOUNTER — Encounter: Payer: Self-pay | Admitting: Family Medicine

## 2018-04-04 ENCOUNTER — Other Ambulatory Visit: Payer: Self-pay | Admitting: Family Medicine

## 2018-04-04 DIAGNOSIS — R808 Other proteinuria: Secondary | ICD-10-CM

## 2018-05-14 ENCOUNTER — Other Ambulatory Visit: Payer: Self-pay | Admitting: Family Medicine

## 2018-05-14 DIAGNOSIS — J438 Other emphysema: Secondary | ICD-10-CM

## 2018-06-10 NOTE — Progress Notes (Signed)
Cardiology Office Note:    Date:  06/11/2018   ID:  [name] Slade Sr., DOB [birth year], MRN [id number]  PCP:  Charlott Rakes, MD  Cardiologist:  Shirlee More, MD    Referring MD: Charlott Rakes, MD    ASSESSMENT:    1. Essential hypertension   2. Bilateral lower extremity edema    PLAN:    In order of problems listed above:  1. Poorly controlled he was switched to intake a higher dose of diuretic with his edema and add clonidine at bedtime goal systolic less than 174 continue his ARB check renal function proBNP and d-dimer with his intense lower extremity edema 2. Clinically is due to his gabapentin and unfortunately he cannot stop it because of his chronic pain.  We will switch him from furosemide to torsemide higher dose and he is awaiting further evaluation with MRI to explore other pain relief modalities.   Next appointment: 3 months   Medication Adjustments/Labs and Tests Ordered: Current medicines are reviewed at length with the patient today.  Concerns regarding medicines are outlined above.  No orders of the defined types were placed in this encounter.  No orders of the defined types were placed in this encounter.   Chief Complaint  Patient presents with  . Follow-up  . Hypertension  . Hyperlipidemia    History of Present Illness:    [name] Gasper Sr. is a 57 y.o. male with a hx of CAC chest pain with a normal MPI hypertension, emphysema and edema with CCB last seen 12/05/17. MUGA 50%, echo EF 55-60% with normal global and segmental function. 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  He is chronically short of breath unchanged related to underlying lung disease no orthopnea chest pain palpitation or syncope.  Unfortunate this time there is not an alternative to gabapentin for pain relief he will continue to sodium restrict  increase his loop diuretic and add additional antihypertensive.  Home blood pressures are running 1 08-1 70 systolic. Past Medical History:  Diagnosis Date  . Anxiety   . CAD (coronary artery disease) 10/06/2017  . COPD (chronic obstructive pulmonary disease) ([place]) 09/29/2017  . Cough   . Depression   . Dyspnea   . 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 Hypertension 09/29/2017  . Myelopathy ([place]) 09/14/2017  . Tobacco abuse 09/29/2017  . Type 2 diabetes mellitus ([place]) 10/17/2011    Past Surgical History:  Procedure Laterality Date  . ANTERIOR CERVICAL DECOMP/DISCECTOMY FUSION N/A 09/14/2017   Procedure: ANTERIOR CERVICAL DECOMPRESSION/DISCECTOMY FUSIONCERVICAL 4- CERVICAL 5, CERVICAL 5- CERVICAL 6;  Surgeon: Kary Kos, MD;  Location: [facility];  Service: Neurosurgery;  Laterality: N/A;  ANTERIOR CERVICAL DECOMPRESSION/DISCECTOMY FUSIONCERVICAL 4- CERVICAL 5, CERVICAL 5- CERVICAL 6    Current Medications: Current Meds  Medication Sig  . acetaminophen (TYLENOL) 500 MG tablet Take 1,000 mg by mouth every 6 (six) hours as needed.  . cetirizine (ZYRTEC) 10 MG tablet Take 10 mg by mouth daily.  . citalopram (CELEXA) 20 MG tablet Take 1 tablet (20 mg total) by mouth daily.  . Fluticasone-Salmeterol (ADVAIR DISKUS) 250-50 MCG/DOSE AEPB Inhale 1 puff into the lungs 2 (two) times daily.  . furosemide (LASIX) 40 MG tablet Take 1 tablet (40 mg total) by mouth daily.  Marland Kitchen gabapentin (NEURONTIN) 300 MG capsule Take 1 capsule (300 mg total) by mouth 2 (two) times daily.  . hydrOXYzine (ATARAX/VISTARIL) 25 MG  tablet Take 1 tablet (25 mg total) by mouth 3 (three) times daily as needed.  . levETIRAcetam (KEPPRA) 500 MG tablet Take 1 tablet (500 mg total) by mouth 2 (two) times daily.  . metFORMIN (GLUCOPHAGE) 500 MG tablet Take 1 tablet (500 mg total) by mouth daily with breakfast.  . nitroGLYCERIN (NITROSTAT) 0.4 MG SL tablet Place 1 tablet (0.4 mg total) under the tongue every 5 (five) minutes as needed for chest pain.    . rosuvastatin (CRESTOR) 10 MG tablet Take 1 tablet (10 mg total) by mouth daily.  . valsartan (DIOVAN) 40 MG tablet Take 1 tablet (40 mg total) by mouth daily as needed. For blood pressure greater than 150 on the top number.  . VENTOLIN HFA 108 (90 Base) MCG/ACT inhaler INHALE 2 PUFFS INTO THE LUNGS EVERY 6 HOURS AS NEEDED FOR WHEEZING OR SHORTNESS OF BREATH.     Allergies:   Patient has no known allergies.   Social History   Socioeconomic History  . Marital status: Single    Spouse name: Not on file  . Number of children: Not on file  . Years of education: Not on file  . Highest education level: Not on file  Occupational History  . Not on file  Social Needs  . Financial resource strain: Not on file  . Food insecurity:    Worry: Not on file    Inability: Not on file  . Transportation needs:    Medical: Not on file    Non-medical: Not on file  Tobacco Use  . Smoking status: Current Every Day Smoker    Packs/day: 2.00    Years: 42.00    Pack years: 84.00  . Smokeless tobacco: Never Used  Substance and Sexual Activity  . Alcohol use: Yes  . Drug use: No  . Sexual activity: Not on file  Lifestyle  . Physical activity:    Days per week: Not on file    Minutes per session: Not on file  . Stress: Not on file  Relationships  . Social connections:    Talks on phone: Not on file    Gets together: Not on file    Attends religious service: Not on file    Active member of club or organization: Not on file    Attends meetings of clubs or organizations: Not on file    Relationship status: Not on file  Other Topics Concern  . Not on file  Social History Narrative  . Not on file     Family History: The patient's family history includes Diabetes in his mother; Hypertension in his brother; Kidney cancer in his father; Lung cancer in his father; Scoliosis in his mother. ROS:   Please see the history of present illness.    All other systems reviewed and are  negative.  EKGs/Labs/Other Studies Reviewed:    The following studies were reviewed today:    Recent Labs: 10/17/2017: Hemoglobin 15.4; Magnesium 2.0; Platelets 256 03/12/2018: ALT 15; BUN 15; Creatinine, Ser 1.28; Potassium 5.4; Sodium 132  Recent Lipid Panel    Component Value Date/Time   CHOL 330 (H) 12/05/2017 1635   TRIG 107 12/05/2017 1635   HDL 66 12/05/2017 1635   CHOLHDL 5.0 12/05/2017 1635   LDLCALC 243 (H) 12/05/2017 1635    Physical Exam:    VS:  BP (!) 170/80 (BP Location: Right Arm, Patient Position: Sitting, Cuff Size: Normal)   Pulse 76   Ht 6\' 1"  (1.854 m)   Wt 229  lb (103.9 kg)   SpO2 95%   BMI 30.21 kg/m     Wt Readings from Last 3 Encounters:  06/11/18 229 lb (103.9 kg)  03/12/18 209 lb 3.2 oz (94.9 kg)  01/11/18 214 lb (97.1 kg)     GEN: He looks chronically ill well nourished, well developed in no acute distress HEENT: Normal NECK: No JVD; No carotid bruits LYMPHATICS: No lymphadenopathy CARDIAC: Distant heart sounds RRR, no murmurs, rubs, gallops RESPIRATORY:  Clear to auscultation without rales, wheezing or rhonchi  ABDOMEN: Soft, non-tender, non-distended MUSCULOSKELETAL: Tense 3+ bilateral edema to the thighs not sacral edema; No deformity  SKIN: Warm and dry NEUROLOGIC:  Alert and oriented x 3 PSYCHIATRIC:  Normal affect    Signed, Shirlee More, MD  06/11/2018 10:22 AM    [facility]

## 2018-06-11 ENCOUNTER — Encounter: Payer: Self-pay | Admitting: Cardiology

## 2018-06-11 ENCOUNTER — Ambulatory Visit (INDEPENDENT_AMBULATORY_CARE_PROVIDER_SITE_OTHER): Payer: Self-pay | Admitting: Cardiology

## 2018-06-11 VITALS — BP 170/80 | HR 76 | Ht 73.0 in | Wt 229.0 lb

## 2018-06-11 DIAGNOSIS — R600 Localized edema: Secondary | ICD-10-CM

## 2018-06-11 DIAGNOSIS — I10 Essential (primary) hypertension: Secondary | ICD-10-CM

## 2018-06-11 HISTORY — DX: Localized edema: R60.0

## 2018-06-11 MED ORDER — CLONIDINE HCL 0.1 MG PO TABS: ORAL_TABLET | ORAL | 3 refills | Status: DC

## 2018-06-11 MED ORDER — TORSEMIDE 20 MG PO TABS: 20.0000 mg | ORAL_TABLET | Freq: Two times a day (BID) | ORAL | 3 refills | Status: DC

## 2018-06-11 NOTE — Patient Instructions (Signed)
Medication Instructions:  Your physician has recommended you make the following change in your medication:   STOP:  Furosemide START: torsemide 20mg  take one tablet twice daily START:  Clonidine 0.1mg  take one tablet @ 10pm every night.  If you need a refill on your cardiac medications before your next appointment, please call your pharmacy.   Lab work: You will have lab work today:  BMP, ProBNP, D [place] If you have labs (blood work) drawn today and your tests are completely normal, you will receive your results only by: Marland Kitchen MyChart Message (if you have MyChart) OR . A paper copy in the mail If you have any lab test that is abnormal or we need to change your treatment, we will call you to review the results.  Testing/Procedures: NONE  Follow-Up: At [facility], you and your health needs are our priority.  As part of our continuing mission to provide you with exceptional heart care, we have created designated Provider Care Teams.  These Care Teams include your primary Cardiologist (physician) and Advanced Practice Providers (APPs -  Physician Assistants and Nurse Practitioners) who all work together to provide you with the care you need, when you need it. You will need a follow up appointment in 3 months.  Please call our office 2 months in advance to schedule this appointment.

## 2018-06-12 ENCOUNTER — Emergency Department (HOSPITAL_BASED_OUTPATIENT_CLINIC_OR_DEPARTMENT_OTHER): Payer: Self-pay

## 2018-06-12 ENCOUNTER — Emergency Department (HOSPITAL_COMMUNITY): Payer: Self-pay

## 2018-06-12 ENCOUNTER — Encounter (HOSPITAL_COMMUNITY): Payer: Self-pay

## 2018-06-12 ENCOUNTER — Observation Stay (HOSPITAL_COMMUNITY)
Admission: EM | Admit: 2018-06-12 | Discharge: 2018-06-14 | Disposition: A | Discharge status: Home / Self Care | Payer: Self-pay | Attending: Internal Medicine | Admitting: Internal Medicine

## 2018-06-12 ENCOUNTER — Other Ambulatory Visit: Payer: Self-pay

## 2018-06-12 ENCOUNTER — Other Ambulatory Visit: Payer: Self-pay | Admitting: Cardiology

## 2018-06-12 DIAGNOSIS — R600 Localized edema: Secondary | ICD-10-CM

## 2018-06-12 DIAGNOSIS — Z23 Encounter for immunization: Secondary | ICD-10-CM | POA: Insufficient documentation

## 2018-06-12 DIAGNOSIS — Z79899 Other long term (current) drug therapy: Secondary | ICD-10-CM | POA: Insufficient documentation

## 2018-06-12 DIAGNOSIS — R609 Edema, unspecified: Secondary | ICD-10-CM

## 2018-06-12 DIAGNOSIS — N179 Acute kidney failure, unspecified: Secondary | ICD-10-CM

## 2018-06-12 DIAGNOSIS — E875 Hyperkalemia: Secondary | ICD-10-CM

## 2018-06-12 DIAGNOSIS — G40909 Epilepsy, unspecified, not intractable, without status epilepticus: Secondary | ICD-10-CM | POA: Insufficient documentation

## 2018-06-12 DIAGNOSIS — F1020 Alcohol dependence, uncomplicated: Secondary | ICD-10-CM | POA: Insufficient documentation

## 2018-06-12 DIAGNOSIS — F329 Major depressive disorder, single episode, unspecified: Secondary | ICD-10-CM

## 2018-06-12 DIAGNOSIS — J449 Chronic obstructive pulmonary disease, unspecified: Secondary | ICD-10-CM | POA: Insufficient documentation

## 2018-06-12 DIAGNOSIS — I130 Hypertensive heart and chronic kidney disease with heart failure and stage 1 through stage 4 chronic kidney disease, or unspecified chronic kidney disease: Secondary | ICD-10-CM | POA: Insufficient documentation

## 2018-06-12 DIAGNOSIS — E1122 Type 2 diabetes mellitus with diabetic chronic kidney disease: Secondary | ICD-10-CM | POA: Insufficient documentation

## 2018-06-12 DIAGNOSIS — R569 Unspecified convulsions: Secondary | ICD-10-CM

## 2018-06-12 DIAGNOSIS — I2510 Atherosclerotic heart disease of native coronary artery without angina pectoris: Secondary | ICD-10-CM | POA: Insufficient documentation

## 2018-06-12 DIAGNOSIS — K7460 Unspecified cirrhosis of liver: Secondary | ICD-10-CM

## 2018-06-12 DIAGNOSIS — E119 Type 2 diabetes mellitus without complications: Secondary | ICD-10-CM

## 2018-06-12 DIAGNOSIS — F32A Depression, unspecified: Secondary | ICD-10-CM | POA: Diagnosis present

## 2018-06-12 DIAGNOSIS — J438 Other emphysema: Secondary | ICD-10-CM

## 2018-06-12 DIAGNOSIS — F418 Other specified anxiety disorders: Secondary | ICD-10-CM | POA: Insufficient documentation

## 2018-06-12 DIAGNOSIS — I5032 Chronic diastolic (congestive) heart failure: Secondary | ICD-10-CM | POA: Insufficient documentation

## 2018-06-12 DIAGNOSIS — F17210 Nicotine dependence, cigarettes, uncomplicated: Secondary | ICD-10-CM | POA: Insufficient documentation

## 2018-06-12 DIAGNOSIS — I10 Essential (primary) hypertension: Secondary | ICD-10-CM

## 2018-06-12 DIAGNOSIS — F419 Anxiety disorder, unspecified: Secondary | ICD-10-CM

## 2018-06-12 DIAGNOSIS — N182 Chronic kidney disease, stage 2 (mild): Secondary | ICD-10-CM | POA: Insufficient documentation

## 2018-06-12 HISTORY — DX: Hyperkalemia: E87.5

## 2018-06-12 HISTORY — DX: Chronic kidney disease, stage 2 (mild): N17.9

## 2018-06-12 LAB — CBC
HEMATOCRIT: 48.9 % (ref 39.0–52.0)
Hemoglobin: 15.9 g/dL (ref 13.0–17.0)
MCH: 31.2 pg (ref 26.0–34.0)
MCHC: 32.5 g/dL (ref 30.0–36.0)
MCV: 95.9 fL (ref 80.0–100.0)
Platelets: 321 10*3/uL (ref 150–400)
RBC: 5.1 MIL/uL (ref 4.22–5.81)
RDW: 13 % (ref 11.5–15.5)
WBC: 11.6 10*3/uL — AB (ref 4.0–10.5)
nRBC: 0 % (ref 0.0–0.2)

## 2018-06-12 LAB — BASIC METABOLIC PANEL
Anion gap: 11 (ref 5–15)
BUN: 29 mg/dL — AB (ref 6–20)
CO2: 22 mmol/L (ref 22–32)
Calcium: 8.8 mg/dL — ABNORMAL LOW (ref 8.9–10.3)
Chloride: 102 mmol/L (ref 98–111)
Creatinine, Ser: 2.07 mg/dL — ABNORMAL HIGH (ref 0.61–1.24)
GFR calc Af Amer: 39 mL/min — ABNORMAL LOW (ref >60)
GFR calc non Af Amer: 34 mL/min — ABNORMAL LOW (ref >60)
Glucose, Bld: 139 mg/dL — ABNORMAL HIGH (ref 70–99)
Potassium: 5.4 mmol/L — ABNORMAL HIGH (ref 3.5–5.1)
Sodium: 135 mmol/L (ref 135–145)

## 2018-06-12 LAB — URINALYSIS, ROUTINE W REFLEX MICROSCOPIC
BILIRUBIN URINE: NEGATIVE
Glucose, UA: 50 mg/dL — AB
Ketones, ur: NEGATIVE mg/dL
Leukocytes, UA: NEGATIVE
Nitrite: NEGATIVE
Protein, ur: 100 mg/dL — AB
SPECIFIC GRAVITY, URINE: 1.009 (ref 1.005–1.030)
pH: 5 (ref 5.0–8.0)

## 2018-06-12 LAB — I-STAT TROPONIN, ED: Troponin i, poc: 0.01 ng/mL (ref 0.00–0.08)

## 2018-06-12 LAB — BRAIN NATRIURETIC PEPTIDE: B Natriuretic Peptide: 24.4 pg/mL (ref 0.0–100.0)

## 2018-06-12 LAB — GLUCOSE, CAPILLARY: Glucose-Capillary: 120 mg/dL — ABNORMAL HIGH (ref 70–99)

## 2018-06-12 MED ORDER — LEVETIRACETAM 500 MG PO TABS
500.0000 mg | ORAL_TABLET | Freq: Two times a day (BID) | ORAL | Status: DC
  Administered 2018-06-12 – 2018-06-14 (×4): 500 mg via ORAL
  Filled 2018-06-12 (×4): qty 1

## 2018-06-12 MED ORDER — VITAMIN B-1 100 MG PO TABS
100.0000 mg | ORAL_TABLET | Freq: Every day | ORAL | Status: DC
  Administered 2018-06-12 – 2018-06-14 (×3): 100 mg via ORAL
  Filled 2018-06-12 (×3): qty 1

## 2018-06-12 MED ORDER — LORAZEPAM 2 MG/ML IJ SOLN
0.0000 mg | Freq: Four times a day (QID) | INTRAMUSCULAR | Status: DC
  Administered 2018-06-12 – 2018-06-14 (×3): 1 mg via INTRAVENOUS
  Filled 2018-06-12 (×3): qty 1

## 2018-06-12 MED ORDER — MOMETASONE FURO-FORMOTEROL FUM 200-5 MCG/ACT IN AERO
2.0000 | INHALATION_SPRAY | Freq: Two times a day (BID) | RESPIRATORY_TRACT | Status: DC
  Administered 2018-06-12 – 2018-06-14 (×4): 2 via RESPIRATORY_TRACT
  Filled 2018-06-12: qty 8.8

## 2018-06-12 MED ORDER — INFLUENZA VAC SPLIT HIGH-DOSE 0.5 ML IM SUSY
0.5000 mL | PREFILLED_SYRINGE | INTRAMUSCULAR | Status: AC
  Administered 2018-06-13: 0.5 mL via INTRAMUSCULAR
  Filled 2018-06-12 (×2): qty 0.5

## 2018-06-12 MED ORDER — CITALOPRAM HYDROBROMIDE 20 MG PO TABS
20.0000 mg | ORAL_TABLET | Freq: Every day | ORAL | Status: DC
  Administered 2018-06-13 – 2018-06-14 (×2): 20 mg via ORAL
  Filled 2018-06-12 (×2): qty 1

## 2018-06-12 MED ORDER — SODIUM CHLORIDE 0.9 % IV BOLUS
500.0000 mL | Freq: Once | INTRAVENOUS | Status: AC
  Administered 2018-06-12: 500 mL via INTRAVENOUS

## 2018-06-12 MED ORDER — FOLIC ACID 1 MG PO TABS
1.0000 mg | ORAL_TABLET | Freq: Every day | ORAL | Status: DC
  Administered 2018-06-12 – 2018-06-14 (×3): 1 mg via ORAL
  Filled 2018-06-12 (×3): qty 1

## 2018-06-12 MED ORDER — SENNOSIDES-DOCUSATE SODIUM 8.6-50 MG PO TABS: 1.0000 | ORAL_TABLET | Freq: Every evening | ORAL | Status: DC | PRN

## 2018-06-12 MED ORDER — HYDROXYZINE HCL 25 MG PO TABS: 25.0000 mg | ORAL_TABLET | Freq: Three times a day (TID) | ORAL | Status: DC | PRN

## 2018-06-12 MED ORDER — SODIUM CHLORIDE 0.9 % IV SOLN: 250.0000 mL | INTRAVENOUS | Status: DC | PRN

## 2018-06-12 MED ORDER — ONDANSETRON HCL 4 MG PO TABS: 4.0000 mg | ORAL_TABLET | Freq: Four times a day (QID) | ORAL | Status: DC | PRN

## 2018-06-12 MED ORDER — ONDANSETRON HCL 4 MG/2ML IJ SOLN: 4.0000 mg | Freq: Four times a day (QID) | INTRAMUSCULAR | Status: DC | PRN

## 2018-06-12 MED ORDER — SODIUM CHLORIDE 0.9% FLUSH
3.0000 mL | Freq: Two times a day (BID) | INTRAVENOUS | Status: DC
  Administered 2018-06-12 – 2018-06-13 (×2): 3 mL via INTRAVENOUS

## 2018-06-12 MED ORDER — ROSUVASTATIN CALCIUM 5 MG PO TABS
10.0000 mg | ORAL_TABLET | Freq: Every day | ORAL | Status: DC
  Administered 2018-06-13: 10 mg via ORAL
  Filled 2018-06-12: qty 2

## 2018-06-12 MED ORDER — INSULIN ASPART 100 UNIT/ML ~~LOC~~ SOLN
0.0000 [IU] | Freq: Three times a day (TID) | SUBCUTANEOUS | Status: DC
  Administered 2018-06-13: 1 [IU] via SUBCUTANEOUS
  Administered 2018-06-13: 2 [IU] via SUBCUTANEOUS
  Administered 2018-06-13: 1 [IU] via SUBCUTANEOUS
  Administered 2018-06-14: 2 [IU] via SUBCUTANEOUS
  Administered 2018-06-14: 1 [IU] via SUBCUTANEOUS

## 2018-06-12 MED ORDER — ADULT MULTIVITAMIN W/MINERALS CH
1.0000 | ORAL_TABLET | Freq: Every day | ORAL | Status: DC
  Administered 2018-06-12 – 2018-06-14 (×3): 1 via ORAL
  Filled 2018-06-12 (×3): qty 1

## 2018-06-12 MED ORDER — ACETAMINOPHEN 325 MG PO TABS: 650.0000 mg | ORAL_TABLET | Freq: Four times a day (QID) | ORAL | Status: DC | PRN

## 2018-06-12 MED ORDER — INSULIN ASPART 100 UNIT/ML ~~LOC~~ SOLN: 0.0000 [IU] | Freq: Every day | SUBCUTANEOUS | Status: DC

## 2018-06-12 MED ORDER — CLONIDINE HCL 0.1 MG PO TABS
0.1000 mg | ORAL_TABLET | Freq: Every day | ORAL | Status: DC
  Administered 2018-06-12: 0.1 mg via ORAL
  Filled 2018-06-12: qty 1

## 2018-06-12 MED ORDER — SODIUM CHLORIDE 0.9% FLUSH: 3.0000 mL | INTRAVENOUS | Status: DC | PRN

## 2018-06-12 MED ORDER — LORAZEPAM 1 MG PO TABS
1.0000 mg | ORAL_TABLET | Freq: Four times a day (QID) | ORAL | Status: DC | PRN
  Administered 2018-06-13: 1 mg via ORAL
  Filled 2018-06-12: qty 1

## 2018-06-12 MED ORDER — GABAPENTIN 300 MG PO CAPS
300.0000 mg | ORAL_CAPSULE | Freq: Two times a day (BID) | ORAL | Status: DC
  Administered 2018-06-12 – 2018-06-14 (×4): 300 mg via ORAL
  Filled 2018-06-12 (×4): qty 1

## 2018-06-12 MED ORDER — SODIUM CHLORIDE 0.9% FLUSH: 3.0000 mL | Freq: Two times a day (BID) | INTRAVENOUS | Status: DC

## 2018-06-12 MED ORDER — PNEUMOCOCCAL VAC POLYVALENT 25 MCG/0.5ML IJ INJ
0.5000 mL | INJECTION | INTRAMUSCULAR | Status: AC
  Administered 2018-06-13: 0.5 mL via INTRAMUSCULAR
  Filled 2018-06-12 (×2): qty 0.5

## 2018-06-12 MED ORDER — HEPARIN SODIUM (PORCINE) 5000 UNIT/ML IJ SOLN
5000.0000 [IU] | Freq: Three times a day (TID) | INTRAMUSCULAR | Status: DC
  Administered 2018-06-12 – 2018-06-14 (×6): 5000 [IU] via SUBCUTANEOUS
  Filled 2018-06-12 (×6): qty 1

## 2018-06-12 MED ORDER — LORAZEPAM 2 MG/ML IJ SOLN: 1.0000 mg | Freq: Four times a day (QID) | INTRAMUSCULAR | Status: DC | PRN

## 2018-06-12 MED ORDER — ALBUTEROL SULFATE (2.5 MG/3ML) 0.083% IN NEBU: 3.0000 mL | INHALATION_SOLUTION | Freq: Four times a day (QID) | RESPIRATORY_TRACT | Status: DC | PRN

## 2018-06-12 MED ORDER — THIAMINE HCL 100 MG/ML IJ SOLN
100.0000 mg | Freq: Every day | INTRAMUSCULAR | Status: DC
  Filled 2018-06-12: qty 2

## 2018-06-12 MED ORDER — HYDROCODONE-ACETAMINOPHEN 5-325 MG PO TABS
1.0000 | ORAL_TABLET | ORAL | Status: DC | PRN
  Administered 2018-06-13: 1 via ORAL
  Filled 2018-06-12: qty 1

## 2018-06-12 MED ORDER — LORAZEPAM 2 MG/ML IJ SOLN: 0.0000 mg | Freq: Two times a day (BID) | INTRAMUSCULAR | Status: DC

## 2018-06-12 MED ORDER — ACETAMINOPHEN 650 MG RE SUPP: 650.0000 mg | Freq: Four times a day (QID) | RECTAL | Status: DC | PRN

## 2018-06-12 NOTE — ED Provider Notes (Signed)
[facility] EMERGENCY DEPARTMENT Provider Note   CSN: [id number] Arrival date & time: 06/12/18  1438     History   Chief Complaint Chief Complaint  Patient presents with  . Abnormal Labs    HPI [name] Greenstreet Sr. is a 57 y.o. male past medical your anxiety, CAD, COPD, hypertension, diabetes who presents for evaluation of abnormal lab work.  Patient was seen by his cardiologist yesterday for routine appointment.  At the time, he had lab work drawn.  He was called today and told that his creatinine, potassium was elevated.  Additionally, he told he had abnormal d-dimer.  He was instructed to come to the emergency department for further evaluation.  Patient endorses bilateral lower extremity edema that is been ongoing for last 5 months.  He states that he always has some degree of shortness of breath secondary to his COPD use but has not noted any worsening or dyspnea on exertion.  He also reports some chest tightness.  He states he was has some degree of chest tightness secondary to his COPD.  No new changes.  He has not noticed any overlying warmth or erythema of his legs.  Patient reports that he has pain in his neck and his back as well as his arm which is typical of his chronic pain.  No new changes.  He denies any fevers, abdominal pain, nausea/vomiting.  Patient does report he smokes approximately 2 packs a day. He denies any hormone use, recent immobilization, prior history of DVT/PE, recent surgery, leg swelling, or long travel.  The history is provided by the patient.    Past Medical History:  Diagnosis Date  . Anxiety   . CAD (coronary artery disease) 10/06/2017  . COPD (chronic obstructive pulmonary disease) ([place]) 09/29/2017  . Cough   . Depression   . Dyspnea   . 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 Hypertension 09/29/2017  . Myelopathy ([place]) 09/14/2017  . Tobacco abuse 09/29/2017  . Type 2 diabetes mellitus ([place]) 10/17/2011    Patient Active Problem List   Diagnosis Date Noted  . Acute renal failure superimposed on stage 2 chronic kidney disease ([place]) 06/12/2018  . Hyperkalemia 06/12/2018  . Bilateral lower extremity edema 06/11/2018  . Seizure ([place]) 10/18/2017  . Alcohol abuse 10/18/2017  . Neuropathy 10/18/2017  . Coronary artery calcification seen on CT scan 10/09/2017  . Anxiety and depression 10/06/2017  . Dyspnea 10/06/2017  . CAD (coronary artery disease) 10/06/2017  . COPD (chronic obstructive pulmonary disease) ([place]) 09/29/2017  . Tobacco abuse 09/29/2017  . Hypertension 09/29/2017  . Myelopathy ([place]) 09/14/2017  . Type 2 diabetes mellitus ([place]) 10/17/2011  . 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    Past Surgical History:  Procedure Laterality Date  . ANTERIOR CERVICAL DECOMP/DISCECTOMY FUSION N/A 09/14/2017   Procedure: ANTERIOR CERVICAL DECOMPRESSION/DISCECTOMY FUSIONCERVICAL 4- CERVICAL 5, CERVICAL 5- CERVICAL 6;  Surgeon: Kary Kos, MD;  Location: [facility];  Service: Neurosurgery;  Laterality: N/A;  ANTERIOR CERVICAL DECOMPRESSION/DISCECTOMY FUSIONCERVICAL 4- CERVICAL 5, CERVICAL 5- CERVICAL 6        Home Medications    Prior to Admission medications   Medication Sig Start Date End Date Taking? Authorizing Provider  cetirizine (ZYRTEC) 10 MG tablet Take 10 mg by mouth daily.    [provider]  citalopram (CELEXA) 20 MG tablet Take 1 tablet (20 mg total) by mouth daily. 03/12/18   Charlott Rakes, MD  cloNIDine (CATAPRES) 0.1 MG tablet Take 1 tablet at 10pm every night 06/11/18   Munley,  Hilton Cork, MD  Fluticasone-Salmeterol (ADVAIR DISKUS) 250-50 MCG/DOSE AEPB Inhale 1 puff into the lungs 2 (two) times daily. 03/12/18   Charlott Rakes, MD  gabapentin (NEURONTIN) 300 MG capsule Take 1 capsule (300 mg total) by mouth 2 (two) times daily. 03/12/18   Charlott Rakes, MD  hydrOXYzine (ATARAX/VISTARIL) 25 MG tablet Take 1 tablet (25 mg total) by mouth 3 (three) times daily as needed. 03/12/18   Charlott Rakes, MD  levETIRAcetam  (KEPPRA) 500 MG tablet Take 1 tablet (500 mg total) by mouth 2 (two) times daily. 03/12/18 06/12/18  Charlott Rakes, MD  metFORMIN (GLUCOPHAGE) 500 MG tablet Take 1 tablet (500 mg total) by mouth daily with breakfast. 03/12/18   Charlott Rakes, MD  nitroGLYCERIN (NITROSTAT) 0.4 MG SL tablet Place 1 tablet (0.4 mg total) under the tongue every 5 (five) minutes as needed for chest pain. 10/10/17 06/12/18  Richardo Priest, MD  rosuvastatin (CRESTOR) 10 MG tablet Take 1 tablet (10 mg total) by mouth daily. 11/07/17 06/12/18  Richardo Priest, MD  torsemide (DEMADEX) 20 MG tablet Take 1 tablet (20 mg total) by mouth 2 (two) times daily. 06/11/18   Richardo Priest, MD  valsartan (DIOVAN) 40 MG tablet Take 1 tablet (40 mg total) by mouth daily as needed. For blood pressure greater than 150 on the top number. 12/05/17   Richardo Priest, MD  VENTOLIN HFA 108 (90 Base) MCG/ACT inhaler INHALE 2 PUFFS INTO THE LUNGS EVERY 6 HOURS AS NEEDED FOR WHEEZING OR SHORTNESS OF BREATH. Patient taking differently: Inhale 2 puffs into the lungs every 6 (six) hours as needed for wheezing or shortness of breath.  05/15/18   Charlott Rakes, MD    Family History Family History  Problem Relation Age of Onset  . Diabetes Mother   . Scoliosis Mother   . Kidney cancer Father   . Lung cancer Father   . Hypertension Brother     Social History Social History   Tobacco Use  . Smoking status: Current Every Day Smoker    Packs/day: 2.00    Years: 42.00    Pack years: 84.00  . Smokeless tobacco: Never Used  Substance Use Topics  . Alcohol use: Yes  . Drug use: No     Allergies   Patient has no known allergies.   Review of Systems Review of Systems  Constitutional: Negative for fever.  Respiratory: Positive for cough, chest tightness and shortness of breath.   Cardiovascular: Positive for leg swelling. Negative for chest pain.  Gastrointestinal: Positive for diarrhea. Negative for abdominal pain, nausea and  vomiting.  Genitourinary: Positive for decreased urine volume. Negative for dysuria and hematuria.  Neurological: Positive for numbness (Chronic RUE). Negative for weakness and headaches.  All other systems reviewed and are negative.    Physical Exam Updated Vital Signs BP (!) 158/86 (BP Location: Right Arm)   Pulse 68   Temp 98.5 F (36.9 C) (Oral)   Resp 20   SpO2 95%   Physical Exam Vitals signs and nursing note reviewed.  Constitutional:      Appearance: Normal appearance. He is well-developed.  HENT:     Head: Normocephalic and atraumatic.  Eyes:     General: Lids are normal.     Conjunctiva/sclera: Conjunctivae normal.     Pupils: Pupils are equal, round, and reactive to light.  Neck:     Musculoskeletal: Full passive range of motion without pain.  Cardiovascular:     Rate and Rhythm: Normal rate  and regular rhythm.     Pulses: Normal pulses.          Radial pulses are 2+ on the right side and 2+ on the left side.       Dorsalis pedis pulses are 2+ on the right side and 2+ on the left side.     Heart sounds: Normal heart sounds. No murmur. No friction rub. No gallop.   Pulmonary:     Effort: Pulmonary effort is normal.     Breath sounds: Normal breath sounds. No rales.     Comments: Lungs clear to auscultation bilaterally.  Symmetric chest rise.  No wheezing, rales, rhonchi. Abdominal:     Palpations: Abdomen is soft. Abdomen is not rigid.     Tenderness: There is no abdominal tenderness. There is no guarding.  Musculoskeletal: Normal range of motion.     Comments: 2+ pitting edema noted to bilateral lower extremities that extends from the proximal tib-fib distally.  No overlying warmth, erythema.  Skin:    General: Skin is warm and dry.     Capillary Refill: Capillary refill takes less than 2 seconds.     Comments: Good distal cap refill. BLE are not dusky in appearance or cool to touch.  Neurological:     Mental Status: He is alert and oriented to person,  place, and time.  Psychiatric:        Speech: Speech normal.      ED Treatments / Results  Labs (all labs ordered are listed, but only abnormal results are displayed) Labs Reviewed  BASIC METABOLIC PANEL - Abnormal; Notable for the following components:      Result Value   Potassium 5.4 (*)    Glucose, Bld 139 (*)    BUN 29 (*)    Creatinine, Ser 2.07 (*)    Calcium 8.8 (*)    GFR calc non Af Amer 34 (*)    GFR calc Af Amer 39 (*)    All other components within normal limits  CBC - Abnormal; Notable for the following components:   WBC 11.6 (*)    All other components within normal limits  URINALYSIS, ROUTINE W REFLEX MICROSCOPIC - Abnormal; Notable for the following components:   Glucose, UA 50 (*)    Hgb urine dipstick MODERATE (*)    Protein, ur 100 (*)    Bacteria, UA RARE (*)    All other components within normal limits  BRAIN NATRIURETIC PEPTIDE  I-STAT TROPONIN, ED    EKG EKG Interpretation  Date/Time:  Tuesday June 12 2018 14:52:23 EST Ventricular Rate:  75 PR Interval:  198 QRS Duration: 86 QT Interval:  376 QTC Calculation: 419 R Axis:   60 Text Interpretation:  Normal sinus rhythm No significant change since last tracing Confirmed by Lennice Sites [phone]) on 06/12/2018 3:38:08 PM   Radiology Dg Chest 2 View  Result Date: 06/12/2018 CLINICAL DATA:  Cough and shortness of breath EXAM: CHEST - 2 VIEW COMPARISON:  Chest CT September 09, 2017 FINDINGS: There is no edema or consolidation. Heart size and pulmonary vascularity are normal. No adenopathy. There is anterior wedging of a lower thoracic vertebral body. There is postoperative change in the lower cervical region. IMPRESSION: No edema or consolidation. Electronically Signed   By: Lowella Grip III M.D.   On: 06/12/2018 17:01   Vas [place] Lower Extremity Venous (dvt) (only [facility])  Result Date: 06/12/2018  Lower Venous Study Indications: Edema.  Limitations: Poor ultrasound/tissue interface.  Performing  Technologist: Oliver Hum RVT  Examination Guidelines: A complete evaluation includes B-mode imaging, spectral Doppler, color Doppler, and power Doppler as needed of all accessible portions of each vessel. Bilateral testing is considered an integral part of a complete examination. Limited examinations for reoccurring indications may be performed as noted.  Right Venous Findings: +---------+---------------+---------+-----------+----------+-------+          CompressibilityPhasicitySpontaneityPropertiesSummary +---------+---------------+---------+-----------+----------+-------+ CFV      Full           Yes      Yes                          +---------+---------------+---------+-----------+----------+-------+ SFJ      Full                                                 +---------+---------------+---------+-----------+----------+-------+ FV Prox  Full                                                 +---------+---------------+---------+-----------+----------+-------+ FV Mid   Full                                                 +---------+---------------+---------+-----------+----------+-------+ FV DistalFull                                                 +---------+---------------+---------+-----------+----------+-------+ PFV      Full                                                 +---------+---------------+---------+-----------+----------+-------+ POP      Full           Yes      Yes                          +---------+---------------+---------+-----------+----------+-------+ PTV      Full                                                 +---------+---------------+---------+-----------+----------+-------+ PERO     Full                                                 +---------+---------------+---------+-----------+----------+-------+  Left Venous Findings: +---------+---------------+---------+-----------+----------+-------+           CompressibilityPhasicitySpontaneityPropertiesSummary +---------+---------------+---------+-----------+----------+-------+ CFV      Full           Yes      Yes                          +---------+---------------+---------+-----------+----------+-------+  SFJ      Full                                                 +---------+---------------+---------+-----------+----------+-------+ FV Prox  Full                                                 +---------+---------------+---------+-----------+----------+-------+ FV Mid   Full                                                 +---------+---------------+---------+-----------+----------+-------+ FV DistalFull                                                 +---------+---------------+---------+-----------+----------+-------+ PFV      Full                                                 +---------+---------------+---------+-----------+----------+-------+ POP      Full           Yes      Yes                          +---------+---------------+---------+-----------+----------+-------+ PTV      Full                                                 +---------+---------------+---------+-----------+----------+-------+ PERO     Full                                                 +---------+---------------+---------+-----------+----------+-------+    Summary: Right: There is no evidence of deep vein thrombosis in the lower extremity. No cystic structure found in the popliteal fossa. Left: There is no evidence of deep vein thrombosis in the lower extremity. No cystic structure found in the popliteal fossa.  *See table(s) above for measurements and observations. Electronically signed by Deitra Mayo MD on 06/12/2018 at 4:31:16 PM.    Final     Procedures Procedures (including critical care time)  Medications Ordered in ED Medications  sodium chloride 0.9 % bolus 500 mL (500 mLs Intravenous New Bag/Given  06/12/18 1930)  sodium chloride 0.9 % bolus 500 mL (0 mLs Intravenous Stopped 06/12/18 1906)     Initial Impression / Assessment and Plan / ED Course  I have reviewed the triage vital signs and the nursing notes.  Pertinent labs & imaging results that were available during my care of the patient were reviewed by me and considered in my medical decision making (see chart for  details).     57 year old male past medical history of anxiety, CAD, COPD, hypertension, diabetes comes in today for abnormal lab work.  Sent by cardiology for elevation in potassium, creatinine.  Additionally, had an elevated d-dimer.  Does report he has been having some bilateral lower extremity over the last several months but no worsening chest pain or difficulty breathing.  No PE risk factors.  He states he has been having some decreased urination but no hematuria. Patient is afebrile, non-toxic appearing, sitting comfortably on examination table. Vital signs reviewed and stable.  Patient with 2+ pitting edema noted bilateral lower extremities.  No overlying warmth, erythema.  Given elevation in d-dimer from cardiology, will plan for ultrasound study.  Additionally, will plan to repeat labs for evaluation of creatinine and potassium.  Troponin normal.  BNP unremarkable.  UA shows moderate hemoglobin.  No infectious etiology.  BMP shows potassium of 5.4.  BUN is elevated at 29 and creatinine is 2.07.  This is increased from his blood work yesterday which showed a creatinine of 1.83.  3 months ago, his creatinine was 1.28. CBC shows leukocytosis of 11.6.  Review of his records yesterday shows d-dimer slightly elevated at 0.73.  Patient with no PE risk factors, he has had some bilateral lower extremity edema but no overlying warmth or erythema.  He is not currently complaining of any chest pain or difficulty breathing.  He is not tachycardic or hypoxic.  Do not suspect PE at this time.  No indication for CTA.  Patient with new  AKI that has worsened since yesterday.  Additionally, patient with hyperkalemia.  He is an ARB was previously on Lasix.  His cardiology switched him yesterday to torsemide which he has not been able to take yet.  Given new worsening AKI, may benefit from admission for possible renal ultrasound and further evaluation.  Discussed patient with Dr. Myna Hidalgo (hospitalist  Final Clinical Impressions(s) / ED Diagnoses   Final diagnoses:  Acute kidney injury [facility])  Hyperkalemia    ED Discharge Orders    None       Volanda Napoleon, PA-C 06/12/18 [place], [place], DO 06/13/18 [phone]

## 2018-06-12 NOTE — ED Notes (Signed)
Pt transported to vascular.  °

## 2018-06-12 NOTE — Progress Notes (Addendum)
Pt. transported from ER via stretcher to 5C-06 to bed; alert and oriented x4; oriented to call button and room; pt. Little wobbly when up and uses a cane; instructed pt. to call for assist when needed to get OOB and bed alarm placed.

## 2018-06-12 NOTE — H&P (Signed)
History and Physical    [employer] Sr[employer] GQQ:[id number] DOB: 24-May-1961 DOA: 06/12/2018  PCP: Charlott Rakes, MD   Patient coming from: Home   Chief Complaint: Fatigue, increased creatinine, hyperkalemia, elevated d-dimer   HPI: [name] Dittus Sr. is a 57 y.o. male with medical history significant for alcohol dependence, COPD, chronic diastolic CHF, neck pain, depression with anxiety, and hypertension, now presenting to the emergency department at the direction of his cardiologist for evaluation of elevated creatinine, potassium, and d-dimer on outpatient blood work.  Patient reports progressive fatigue and worsening lower extremity edema over the past couple months.  Denies any chest pain, increase in his chronic cough or dyspnea, fevers, or chills.  He was seen by his cardiologist yesterday and blood work was obtained, returning with elevated creatinine, hyperkalemia, and d-dimer of 0.79.  ED Course: Upon arrival to the ED, patient is found to be afebrile, saturating well on room air, and with vitals otherwise stable.  EKG features a sinus rhythm and chest x-ray is negative for edema or consolidation.  Lower extremity venous Dopplers are negative for DVT in either leg.  Chemistry panel is notable for a potassium of 5.4, BUN 29, and creatinine of 2.07, up from 1.83 the day before, and up from 1.2-1.3 earlier this year.  CBC features a slight leukocytosis.  Troponin and BNP are normal.  Patient was given a liter of normal saline in the ED.  He remains hemodynamically stable and will be observed for further evaluation and management of acute kidney injury superimposed on CKD 2.  Review of Systems:  All other systems reviewed and apart from HPI, are negative.  Past Medical History:  Diagnosis Date  . Anxiety   . CAD (coronary artery disease) 10/06/2017  . COPD (chronic obstructive pulmonary disease) ([place]) 09/29/2017  . Cough   . Depression   . Dyspnea   . 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  Hypertension 09/29/2017  . Myelopathy ([place]) 09/14/2017  . Tobacco abuse 09/29/2017  . Type 2 diabetes mellitus ([place]) 10/17/2011    Past Surgical History:  Procedure Laterality Date  . ANTERIOR CERVICAL DECOMP/DISCECTOMY FUSION N/A 09/14/2017   Procedure: ANTERIOR CERVICAL DECOMPRESSION/DISCECTOMY FUSIONCERVICAL 4- CERVICAL 5, CERVICAL 5- CERVICAL 6;  Surgeon: Kary Kos, MD;  Location: [facility];  Service: Neurosurgery;  Laterality: N/A;  ANTERIOR CERVICAL DECOMPRESSION/DISCECTOMY FUSIONCERVICAL 4- CERVICAL 5, CERVICAL 5- CERVICAL 6     reports that he has been smoking. He has a 84.00 pack-year smoking history. He has never used smokeless tobacco. He reports current alcohol use. He reports that he does not use drugs.  No Known Allergies  Family History  Problem Relation Age of Onset  . Diabetes Mother   . Scoliosis Mother   . Kidney cancer Father   . Lung cancer Father   . Hypertension Brother      Prior to Admission medications   Medication Sig Start Date End Date Taking? Authorizing Provider  cetirizine (ZYRTEC) 10 MG tablet Take 10 mg by mouth daily.    [provider]  citalopram (CELEXA) 20 MG tablet Take 1 tablet (20 mg total) by mouth daily. 03/12/18   Charlott Rakes, MD  cloNIDine (CATAPRES) 0.1 MG tablet Take 1 tablet at 10pm every night 06/11/18   Richardo Priest, MD  Fluticasone-Salmeterol (ADVAIR DISKUS) 250-50 MCG/DOSE AEPB Inhale 1 puff into the lungs 2 (two) times daily. 03/12/18   Charlott Rakes, MD  gabapentin (NEURONTIN) 300 MG capsule Take 1 capsule (300 mg total) by mouth 2 (two)  times daily. 03/12/18   Charlott Rakes, MD  hydrOXYzine (ATARAX/VISTARIL) 25 MG tablet Take 1 tablet (25 mg total) by mouth 3 (three) times daily as needed. 03/12/18   Charlott Rakes, MD  levETIRAcetam (KEPPRA) 500 MG tablet Take 1 tablet (500 mg total) by mouth 2 (two) times daily. 03/12/18 06/12/18  Charlott Rakes, MD  metFORMIN (GLUCOPHAGE) 500 MG tablet Take 1 tablet (500 mg total) by  mouth daily with breakfast. 03/12/18   Charlott Rakes, MD  nitroGLYCERIN (NITROSTAT) 0.4 MG SL tablet Place 1 tablet (0.4 mg total) under the tongue every 5 (five) minutes as needed for chest pain. 10/10/17 06/12/18  Richardo Priest, MD  rosuvastatin (CRESTOR) 10 MG tablet Take 1 tablet (10 mg total) by mouth daily. 11/07/17 06/12/18  Richardo Priest, MD  torsemide (DEMADEX) 20 MG tablet Take 1 tablet (20 mg total) by mouth 2 (two) times daily. 06/11/18   Richardo Priest, MD  valsartan (DIOVAN) 40 MG tablet Take 1 tablet (40 mg total) by mouth daily as needed. For blood pressure greater than 150 on the top number. 12/05/17   Richardo Priest, MD  VENTOLIN HFA 108 (90 Base) MCG/ACT inhaler INHALE 2 PUFFS INTO THE LUNGS EVERY 6 HOURS AS NEEDED FOR WHEEZING OR SHORTNESS OF BREATH. Patient taking differently: Inhale 2 puffs into the lungs every 6 (six) hours as needed for wheezing or shortness of breath.  05/15/18   Charlott Rakes, MD    Physical Exam: Vitals:   06/12/18 1537 06/12/18 1600 06/12/18 1733 06/12/18 1949  BP:  136/86 (!) 142/89 (!) 158/86  Pulse: 76 68 69 68  Resp:  19 15 20   Temp:      TempSrc:      SpO2: 97% 95% 95% 95%    Constitutional: NAD, anxious  Eyes: PERTLA, lids and conjunctivae normal ENMT: Mucous membranes are moist. Posterior pharynx clear of any exudate or lesions.   Neck: normal, supple, no masses, no thyromegaly Respiratory: clear to auscultation bilaterally, no wheezing, no crackles. Normal respiratory effort.    Cardiovascular: S1 & S2 heard, regular rate and rhythm. Pitting edema to bilateral LE's. No significant JVD. Abdomen: No distension, no tenderness, soft. Bowel sounds active.  Musculoskeletal: no clubbing / cyanosis. No joint deformity upper and lower extremities.  Skin: no significant rashes, lesions, ulcers. Warm, dry, well-perfused. Neurologic: CN 2-12 grossly intact. Sensation intact. Moving all extremities.  Psychiatric: Alert and oriented x 3.  Anxious. Pleasant, cooperative.    Labs on Admission: I have personally reviewed following labs and imaging studies  CBC: Recent Labs  Lab 06/12/18 1509  WBC 11.6*  HGB 15.9  HCT 48.9  MCV 95.9  PLT 213   Basic Metabolic Panel: Recent Labs  Lab 06/11/18 1053 06/12/18 1509  NA 139 135  K 6.0* 5.4*  CL 105 102  CO2 16* 22  GLUCOSE 136* 139*  BUN 34* 29*  CREATININE 1.83* 2.07*  CALCIUM 9.0 8.8*   GFR: Estimated Creatinine Clearance: 48.6 mL/min (A) (by C-G formula based on SCr of 2.07 mg/dL (H)). Liver Function Tests: No results for input(s): AST, ALT, ALKPHOS, BILITOT, PROT, ALBUMIN in the last 168 hours. No results for input(s): LIPASE, AMYLASE in the last 168 hours. No results for input(s): AMMONIA in the last 168 hours. Coagulation Profile: No results for input(s): INR, PROTIME in the last 168 hours. Cardiac Enzymes: No results for input(s): CKTOTAL, CKMB, CKMBINDEX, TROPONINI in the last 168 hours. BNP (last 3 results) Recent Labs  06/11/18 1053  PROBNP WILL FOLLOW   HbA1C: No results for input(s): HGBA1C in the last 72 hours. CBG: No results for input(s): GLUCAP in the last 168 hours. Lipid Profile: No results for input(s): CHOL, HDL, LDLCALC, TRIG, CHOLHDL, LDLDIRECT in the last 72 hours. Thyroid Function Tests: No results for input(s): TSH, T4TOTAL, FREET4, T3FREE, THYROIDAB in the last 72 hours. Anemia Panel: No results for input(s): VITAMINB12, FOLATE, FERRITIN, TIBC, IRON, RETICCTPCT in the last 72 hours. Urine analysis:    Component Value Date/Time   COLORURINE YELLOW 06/12/2018 [place] 06/12/2018 1558   LABSPEC 1.009 06/12/2018 1558   PHURINE 5.0 06/12/2018 1558   GLUCOSEU 50 (A) 06/12/2018 1558   HGBUR MODERATE (A) 06/12/2018 1558   BILIRUBINUR NEGATIVE 06/12/2018 1558   BILIRUBINUR neg 01/11/2018 1459   KETONESUR NEGATIVE 06/12/2018 1558   PROTEINUR 100 (A) 06/12/2018 1558   UROBILINOGEN 0.2 01/11/2018 1459    NITRITE NEGATIVE 06/12/2018 1558   LEUKOCYTESUR NEGATIVE 06/12/2018 1558   Sepsis Labs: @LABRCNTIP (procalcitonin:4,lacticidven:4) )No results found for this or any previous visit (from the past 240 hour(s)).   Radiological Exams on Admission: Dg Chest 2 View  Result Date: 06/12/2018 CLINICAL DATA:  Cough and shortness of breath EXAM: CHEST - 2 VIEW COMPARISON:  Chest CT September 09, 2017 FINDINGS: There is no edema or consolidation. Heart size and pulmonary vascularity are normal. No adenopathy. There is anterior wedging of a lower thoracic vertebral body. There is postoperative change in the lower cervical region. IMPRESSION: No edema or consolidation. Electronically Signed   By: Lowella Grip III M.D.   On: 06/12/2018 17:01   Vas [place] Lower Extremity Venous (dvt) (only [facility])  Result Date: 06/12/2018  Lower Venous Study Indications: Edema.  Limitations: Poor ultrasound/tissue interface. Performing Technologist: Oliver Hum RVT  Examination Guidelines: A complete evaluation includes B-mode imaging, spectral Doppler, color Doppler, and power Doppler as needed of all accessible portions of each vessel. Bilateral testing is considered an integral part of a complete examination. Limited examinations for reoccurring indications may be performed as noted.  Right Venous Findings: +---------+---------------+---------+-----------+----------+-------+          CompressibilityPhasicitySpontaneityPropertiesSummary +---------+---------------+---------+-----------+----------+-------+ CFV      Full           Yes      Yes                          +---------+---------------+---------+-----------+----------+-------+ SFJ      Full                                                 +---------+---------------+---------+-----------+----------+-------+ FV Prox  Full                                                 +---------+---------------+---------+-----------+----------+-------+ FV Mid    Full                                                 +---------+---------------+---------+-----------+----------+-------+ FV DistalFull                                                 +---------+---------------+---------+-----------+----------+-------+  PFV      Full                                                 +---------+---------------+---------+-----------+----------+-------+ POP      Full           Yes      Yes                          +---------+---------------+---------+-----------+----------+-------+ PTV      Full                                                 +---------+---------------+---------+-----------+----------+-------+ PERO     Full                                                 +---------+---------------+---------+-----------+----------+-------+  Left Venous Findings: +---------+---------------+---------+-----------+----------+-------+          CompressibilityPhasicitySpontaneityPropertiesSummary +---------+---------------+---------+-----------+----------+-------+ CFV      Full           Yes      Yes                          +---------+---------------+---------+-----------+----------+-------+ SFJ      Full                                                 +---------+---------------+---------+-----------+----------+-------+ FV Prox  Full                                                 +---------+---------------+---------+-----------+----------+-------+ FV Mid   Full                                                 +---------+---------------+---------+-----------+----------+-------+ FV DistalFull                                                 +---------+---------------+---------+-----------+----------+-------+ PFV      Full                                                 +---------+---------------+---------+-----------+----------+-------+ POP      Full           Yes      Yes                           +---------+---------------+---------+-----------+----------+-------+  PTV      Full                                                 +---------+---------------+---------+-----------+----------+-------+ PERO     Full                                                 +---------+---------------+---------+-----------+----------+-------+    Summary: Right: There is no evidence of deep vein thrombosis in the lower extremity. No cystic structure found in the popliteal fossa. Left: There is no evidence of deep vein thrombosis in the lower extremity. No cystic structure found in the popliteal fossa.  *See table(s) above for measurements and observations. Electronically signed by Deitra Mayo MD on 06/12/2018 at 4:31:16 PM.    Final     EKG: Independently reviewed. Sinus rhythm.   Assessment/Plan  1. Acute kidney injury superimposed on CKD II; hyperkalemia   - Presents for eval of increased creatinine, hyperkalemia, and mild d-dimer elevation on outpatient labs  - Found to have serum creatinine of 2.07, up from 1.83 the day prior, and up from 1.2-1.3 earlier this year  - Potassium is slightly elevated, improved from the day before, no EKG changes, likely secondary to AKI on CKD  - He was treated with 1 liter NS in ED  - Hold ARB and diuretic, check urine chemistries and renal US, repeat chemistries in am   2. Chronic diastolic CHF  - Wt appears to be up 20 lbs in last 3 months with increased leg swelling, but no pulm edema or resp complaints, and normal BNP  - Per recent cardiology notes, concern edema related to gabapentin use  - Follow daily wt and I/O's, check albumin given increased edema, hold diuretic and ARB while evaluating AKI    3. CAD - No anginal complaints  - Continue statin    4. Hypertension  - BP at goal  - Continue clonidine, hold valsartan until renal function stabilizes    5. COPD - Stable  - Continue ICS/LABA and prn albuterol    6. Anxiety, depression  -  Continue Celexa; currently using Ativan as-needed per CIWA score  7. Type II DM  - A1c 6.6% in September  - Managed with metformin at home, held on admission  - Check CBG's and use a low-intensity SSI with Novolog while in hospital    8. Seizure disorder  - Continue Keppra   9. Alcohol dependence  - Reports beginning to feel "jittery" after arriving on floor  - Monitor with CIWA, use Ativan as-needed, supplement vitamins   DVT prophylaxis: sq heparin  Code Status: Full  Family Communication: Discussed with patient  Consults called: None Admission status: Observation    Vianne Bulls, MD [facility] Pager [phone]  If 7PM-7AM, please contact night-coverage [email or web address] Password [email or web address]  06/12/2018, 8:04 PM

## 2018-06-12 NOTE — Progress Notes (Signed)
Bilateral lower extremity venous duplex has been completed. Negative for DVT. Results were given to Providence Lanius PA.  06/12/18 4:20 PM [name] [name] RVT

## 2018-06-12 NOTE — ED Triage Notes (Signed)
Pt reports he was called from his cardiologist office that his potassium was 6.1 and his creatinine and d dimer were also elevated. Pt c,o generalized fatigue and pain.

## 2018-06-13 ENCOUNTER — Observation Stay (HOSPITAL_COMMUNITY): Payer: Self-pay

## 2018-06-13 DIAGNOSIS — N289 Disorder of kidney and ureter, unspecified: Secondary | ICD-10-CM

## 2018-06-13 LAB — BASIC METABOLIC PANEL
Anion gap: 9 (ref 5–15)
BUN: 28 mg/dL — ABNORMAL HIGH (ref 6–20)
CALCIUM: 8.4 mg/dL — AB (ref 8.9–10.3)
CO2: 22 mmol/L (ref 22–32)
Chloride: 106 mmol/L (ref 98–111)
Creatinine, Ser: 1.89 mg/dL — ABNORMAL HIGH (ref 0.61–1.24)
GFR calc Af Amer: 44 mL/min — ABNORMAL LOW (ref >60)
GFR calc non Af Amer: 38 mL/min — ABNORMAL LOW (ref >60)
Glucose, Bld: 161 mg/dL — ABNORMAL HIGH (ref 70–99)
Potassium: 4.4 mmol/L (ref 3.5–5.1)
SODIUM: 137 mmol/L (ref 135–145)

## 2018-06-13 LAB — GLUCOSE, CAPILLARY
Glucose-Capillary: 156 mg/dL — ABNORMAL HIGH (ref 70–99)
Glucose-Capillary: 135 mg/dL — ABNORMAL HIGH (ref 70–99)
Glucose-Capillary: 150 mg/dL — ABNORMAL HIGH (ref 70–99)
Glucose-Capillary: 134 mg/dL — ABNORMAL HIGH (ref 70–99)

## 2018-06-13 LAB — CREATININE, URINE, RANDOM: Creatinine, Urine: 68.91 mg/dL

## 2018-06-13 LAB — HEPATIC FUNCTION PANEL
ALT: 17 U/L (ref 0–44)
AST: 15 U/L (ref 15–41)
Albumin: 2.1 g/dL — ABNORMAL LOW (ref 3.5–5.0)
Alkaline Phosphatase: 43 U/L (ref 38–126)
Bilirubin, Direct: <0.1 mg/dL (ref 0.0–0.2)
Total Bilirubin: 0.5 mg/dL (ref 0.3–1.2)
Total Protein: 4.7 g/dL — ABNORMAL LOW (ref 6.5–8.1)

## 2018-06-13 LAB — VITAMIN B12: Vitamin B-12: 281 pg/mL (ref 180–914)

## 2018-06-13 LAB — SODIUM, URINE, RANDOM: SODIUM UR: 28 mmol/L

## 2018-06-13 LAB — HIV ANTIBODY (ROUTINE TESTING W REFLEX): HIV Screen 4th Generation wRfx: NONREACTIVE

## 2018-06-13 MED ORDER — CLONIDINE HCL 0.1 MG PO TABS
0.1000 mg | ORAL_TABLET | Freq: Two times a day (BID) | ORAL | Status: DC
  Administered 2018-06-13 – 2018-06-14 (×2): 0.1 mg via ORAL
  Filled 2018-06-13 (×2): qty 1

## 2018-06-13 MED ORDER — SODIUM CHLORIDE 0.9 % IV SOLN
INTRAVENOUS | Status: DC
  Administered 2018-06-13 – 2018-06-14 (×2): via INTRAVENOUS

## 2018-06-13 MED ORDER — NICOTINE 14 MG/24HR TD PT24
14.0000 mg | MEDICATED_PATCH | Freq: Every day | TRANSDERMAL | Status: DC
  Administered 2018-06-13 – 2018-06-14 (×2): 14 mg via TRANSDERMAL
  Filled 2018-06-13 (×2): qty 1

## 2018-06-13 MED ORDER — VITAMIN B-12 1000 MCG PO TABS
1000.0000 ug | ORAL_TABLET | Freq: Every day | ORAL | Status: DC
  Administered 2018-06-13 – 2018-06-14 (×2): 1000 ug via ORAL
  Filled 2018-06-13 (×2): qty 1

## 2018-06-13 NOTE — Plan of Care (Signed)
  Problem: Education: Goal: Knowledge of General Education information will improve Description Including pain rating scale, medication(s)/side effects and non-pharmacologic comfort measures Outcome: Progressing Note:  POC and orders reviewed with pt.   

## 2018-06-13 NOTE — Progress Notes (Signed)
Progress Note    [employer]  JKD:[id number] DOB: 11/13/1960  DOA: 06/12/2018 PCP: Charlott Rakes, MD    Brief Narrative:     Medical records reviewed and are as summarized below:  [name] Slade Sr. is an 57 y.o. male with medical history significant for alcohol dependence, COPD, chronic diastolic CHF, neck pain, depression with anxiety, and hypertension, now presenting to the emergency department at the direction of his cardiologist for evaluation of elevated creatinine, potassium, and d-dimer on outpatient blood work.  Patient reports progressive fatigue and worsening lower extremity edema over the past couple months.   Assessment/Plan:   Principal Problem:   Acute renal failure superimposed on stage 2 chronic kidney disease (HCC) Active Problems:   Type 2 diabetes mellitus (HCC)   COPD (chronic obstructive pulmonary disease) (HCC)   Hypertension   Anxiety and depression   CAD (coronary artery disease)   Seizure (HCC)   Bilateral lower extremity edema   Hyperkalemia  Renal insufficiency superimposed on CKD II; hyperkalemia   - Presents for eval of increased creatinine, hyperkalemia, and mild d-dimer elevation on outpatient labs  - Found to have serum creatinine of 2.07, up from 1.83 the day prior, and up from 1.2-1.3 earlier this year  - Potassium is slightly elevated, improved from the day before, no EKG changes, 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 Liver disease from alcohol abuse (LFTS normal)-- check RUQ U/S  CAD - No anginal complaints  - Continue  statin     Hypertension  - increase clonidine to BID, hold valsartan until renal function stabilizes     COPD - Continue ICS/LABA and prn albuterol     Anxiety, depression  - Continue Celexa  Type II DM  - A1c 6.6% in September  - Managed with metformin at home, held on admission  -SSI  Seizure disorder  - Continue Keppra   Alcohol use - CIWA   Family Communication/Anticipated D/C date and plan/Code Status   DVT prophylaxis: heparin Code Status: Full Code.  Family Communication:  Disposition Plan:    Medical Consultants:    None.    Subjective:   "Feels like my legs are on fire"  Objective:    Vitals:   06/13/18 0231 06/13/18 0553 06/13/18 0818 06/13/18 1146  BP: 136/76 (!) 144/86  (!) 148/84  Pulse: 78 60  66  Resp: 20 18  18   Temp: 98.6 F (37 C) 97.7 F (36.5 C)  97.9 F (36.6 C)  TempSrc: Oral Oral  Oral  SpO2: 94% 96% 94% 97%  Weight:      Height:        Intake/Output Summary (Last 24 hours) at 06/13/2018 1611 Last data filed at 06/13/2018 1200 Gross per 24 hour  Intake 240 ml  Output 2 ml  Net 238 ml   Filed Weights   06/12/18 2025  Weight: 99.5 kg    Exam: Sleeping, NAD Tenderness to palpation of lower extremities Mild tremor rrr No increased work of breathing  Limited insight into disease process  Data Reviewed:   I have personally reviewed following labs and imaging studies:  Labs: Labs show the following:   Basic Metabolic Panel: Recent Labs  Lab 06/11/18 1053 06/12/18 1509 06/13/18 0305  NA 139 135 137  K 6.0* 5.4* 4.4  CL 105 102 106  CO2 16* 22 22  GLUCOSE 136* 139* 161*  BUN 34* 29* 28*  CREATININE 1.83* 2.07* 1.89*  CALCIUM 9.0 8.8* 8.4*   GFR Estimated Creatinine Clearance: 53 mL/min (A) (by C-G formula based on SCr of 1.89 mg/dL (H)). Liver Function Tests: Recent Labs  Lab 06/13/18 0305  AST 15  ALT 17  ALKPHOS 43  BILITOT 0.5  PROT 4.7*  ALBUMIN 2.1*   No results for input(s):  LIPASE, AMYLASE in the last 168 hours. No results for input(s): AMMONIA in the last 168 hours. Coagulation profile No results for input(s): INR, PROTIME in the last 168 hours.  CBC: Recent Labs  Lab 06/12/18 1509  WBC 11.6*  HGB 15.9  HCT 48.9  MCV 95.9  PLT 321   Cardiac Enzymes: No results for input(s): CKTOTAL, CKMB, CKMBINDEX, TROPONINI in the last 168 hours. BNP (last 3 results) Recent Labs    06/11/18 1053  PROBNP WILL FOLLOW   CBG: Recent Labs  Lab 06/12/18 2128 06/13/18 0645 06/13/18 1144  GLUCAP 120* 156* 135*   D-Dimer: Recent Labs    06/11/18 1053  DDIMER 0.79*   Hgb A1c: No results for input(s): HGBA1C in the last 72 hours. Lipid Profile: No results for input(s): CHOL, HDL, LDLCALC, TRIG, CHOLHDL, LDLDIRECT in the last 72 hours. Thyroid function studies: No results for input(s): TSH, T4TOTAL, T3FREE, THYROIDAB in the last 72 hours.  Invalid input(s): FREET3 Anemia work up: Recent Labs    06/13/18 1141  VITAMINB12 281   Sepsis Labs: Recent Labs  Lab 06/12/18 1509  WBC 11.6*    Microbiology No results found for this or any previous visit (from the past 240 hour(s)).  Procedures and diagnostic studies:  Dg Chest 2 View  Result Date: 06/12/2018 CLINICAL DATA:  Cough and shortness of breath EXAM: CHEST - 2 VIEW COMPARISON:  Chest CT September 09, 2017 FINDINGS: There is no edema or consolidation. Heart size and pulmonary vascularity are normal. No adenopathy. There is anterior wedging of a lower thoracic vertebral body. There is postoperative change in the lower cervical region. IMPRESSION: No edema or consolidation. Electronically Signed   By: Lowella Grip III M.D.   On: 06/12/2018 17:01   US Renal  Result Date: 06/13/2018 CLINICAL DATA:  Acute kidney injury. EXAM: RENAL / URINARY TRACT ULTRASOUND COMPLETE COMPARISON:  None. FINDINGS: Right Kidney: Renal measurements: 11.4 x 5.5 x 6.2 cm = volume: 206 mL . Echogenicity within normal  limits. No mass or hydronephrosis visualized. Left Kidney: Renal measurements: 14.7 x 6.6 x 6.2 cm = volume: 323 mL. Echogenicity within normal limits. No mass or hydronephrosis visualized. Bladder: Appears normal for degree of bladder distention. IMPRESSION: Normal renal ultrasound. Electronically Signed   By: Titus Dubin M.D.   On: 06/13/2018 00:45   Vas [place] Lower Extremity Venous (dvt) (only [facility])  Result Date: 06/12/2018  Lower Venous Study Indications: Edema.  Limitations: Poor ultrasound/tissue interface. Performing Technologist: Oliver Hum RVT  Examination Guidelines: A complete evaluation includes B-mode imaging, spectral Doppler, color Doppler, and power Doppler as needed of all accessible portions of each vessel. Bilateral testing is considered an integral part of a complete examination.  Limited examinations for reoccurring indications may be performed as noted.  Right Venous Findings: +---------+---------------+---------+-----------+----------+-------+          CompressibilityPhasicitySpontaneityPropertiesSummary +---------+---------------+---------+-----------+----------+-------+ CFV      Full           Yes      Yes                          +---------+---------------+---------+-----------+----------+-------+ SFJ      Full                                                 +---------+---------------+---------+-----------+----------+-------+ FV Prox  Full                                                 +---------+---------------+---------+-----------+----------+-------+ FV Mid   Full                                                 +---------+---------------+---------+-----------+----------+-------+ FV DistalFull                                                 +---------+---------------+---------+-----------+----------+-------+ PFV      Full                                                  +---------+---------------+---------+-----------+----------+-------+ POP      Full           Yes      Yes                          +---------+---------------+---------+-----------+----------+-------+ PTV      Full                                                 +---------+---------------+---------+-----------+----------+-------+ PERO     Full                                                 +---------+---------------+---------+-----------+----------+-------+  Left Venous Findings: +---------+---------------+---------+-----------+----------+-------+          CompressibilityPhasicitySpontaneityPropertiesSummary +---------+---------------+---------+-----------+----------+-------+ CFV      Full           Yes      Yes                          +---------+---------------+---------+-----------+----------+-------+ SFJ      Full                                                 +---------+---------------+---------+-----------+----------+-------+  FV Prox  Full                                                 +---------+---------------+---------+-----------+----------+-------+ FV Mid   Full                                                 +---------+---------------+---------+-----------+----------+-------+ FV DistalFull                                                 +---------+---------------+---------+-----------+----------+-------+ PFV      Full                                                 +---------+---------------+---------+-----------+----------+-------+ POP      Full           Yes      Yes                          +---------+---------------+---------+-----------+----------+-------+ PTV      Full                                                 +---------+---------------+---------+-----------+----------+-------+ PERO     Full                                                  +---------+---------------+---------+-----------+----------+-------+    Summary: Right: There is no evidence of deep vein thrombosis in the lower extremity. No cystic structure found in the popliteal fossa. Left: There is no evidence of deep vein thrombosis in the lower extremity. No cystic structure found in the popliteal fossa.  *See table(s) above for measurements and observations. Electronically signed by Deitra Mayo MD on 06/12/2018 at 4:31:16 PM.    Final     Medications:   . citalopram  20 mg Oral Daily  . cloNIDine  0.1 mg Oral BID  . folic acid  1 mg Oral Daily  . gabapentin  300 mg Oral BID  . heparin  5,000 Units Subcutaneous Q8H  . insulin aspart  0-5 Units Subcutaneous QHS  . insulin aspart  0-9 Units Subcutaneous TID WC  . levETIRAcetam  500 mg Oral BID  . LORazepam  0-4 mg Intravenous Q6H   Followed by  . [START ON 06/14/2018] LORazepam  0-4 mg Intravenous Q12H  . mometasone-formoterol  2 puff Inhalation BID  . multivitamin with minerals  1 tablet Oral Daily  . nicotine  14 mg Transdermal Daily  . rosuvastatin  10 mg Oral q1800  . sodium chloride flush  3 mL Intravenous Q12H  . sodium chloride flush  3 mL Intravenous Q12H  . thiamine  100 mg Oral Daily   Or  . thiamine  100 mg Intravenous Daily  . vitamin B-12  1,000 mcg Oral Daily   Continuous Infusions: . sodium chloride    . sodium chloride 50 mL/hr at 06/13/18 1610     LOS: 0 days   Geradine Girt  [facility]   *Please refer to [email or web address], password [email or web address] to get updated schedule on who will round on this patient, as hospitalists switch teams weekly. If 7PM-7AM, please contact night-coverage at [email or web address], password [email or web address] for any overnight needs.  06/13/2018, 4:11 PM

## 2018-06-14 ENCOUNTER — Observation Stay (HOSPITAL_COMMUNITY): Payer: Self-pay

## 2018-06-14 LAB — CBC
HCT: 41.8 % (ref 39.0–52.0)
Hemoglobin: 14.3 g/dL (ref 13.0–17.0)
MCH: 32.4 pg (ref 26.0–34.0)
MCHC: 34.2 g/dL (ref 30.0–36.0)
MCV: 94.6 fL (ref 80.0–100.0)
PLATELETS: 299 10*3/uL (ref 150–400)
RBC: 4.42 MIL/uL (ref 4.22–5.81)
RDW: 12.8 % (ref 11.5–15.5)
WBC: 11.2 10*3/uL — ABNORMAL HIGH (ref 4.0–10.5)
nRBC: 0 % (ref 0.0–0.2)

## 2018-06-14 LAB — BASIC METABOLIC PANEL
Anion gap: 11 (ref 5–15)
BUN: 26 mg/dL — ABNORMAL HIGH (ref 6–20)
CO2: 18 mmol/L — ABNORMAL LOW (ref 22–32)
CREATININE: 1.69 mg/dL — AB (ref 0.61–1.24)
Calcium: 8.3 mg/dL — ABNORMAL LOW (ref 8.9–10.3)
Chloride: 109 mmol/L (ref 98–111)
GFR calc Af Amer: 50 mL/min — ABNORMAL LOW (ref >60)
GFR calc non Af Amer: 43 mL/min — ABNORMAL LOW (ref >60)
Glucose, Bld: 146 mg/dL — ABNORMAL HIGH (ref 70–99)
Potassium: 4.3 mmol/L (ref 3.5–5.1)
Sodium: 138 mmol/L (ref 135–145)

## 2018-06-14 LAB — TSH: TSH: 4.523 u[IU]/mL — ABNORMAL HIGH (ref 0.350–4.500)

## 2018-06-14 LAB — GLUCOSE, CAPILLARY
Glucose-Capillary: 138 mg/dL — ABNORMAL HIGH (ref 70–99)
Glucose-Capillary: 180 mg/dL — ABNORMAL HIGH (ref 70–99)

## 2018-06-14 LAB — UREA NITROGEN, URINE: Urea Nitrogen, Ur: 285 mg/dL

## 2018-06-14 MED ORDER — CLONIDINE HCL 0.1 MG PO TABS: 0.1000 mg | ORAL_TABLET | Freq: Two times a day (BID) | ORAL | 0 refills | Status: DC

## 2018-06-14 MED ORDER — CYANOCOBALAMIN 1000 MCG PO TABS: 1000.0000 ug | ORAL_TABLET | Freq: Every day | ORAL | 0 refills | Status: DC

## 2018-06-14 MED ORDER — THIAMINE HCL 100 MG PO TABS: 100.0000 mg | ORAL_TABLET | Freq: Every day | ORAL | 0 refills | Status: DC

## 2018-06-14 MED ORDER — SODIUM BICARBONATE 650 MG PO TABS: 650.0000 mg | ORAL_TABLET | Freq: Three times a day (TID) | ORAL | Status: DC

## 2018-06-14 MED ORDER — FOLIC ACID 1 MG PO TABS: 1.0000 mg | ORAL_TABLET | Freq: Every day | ORAL | 0 refills | Status: DC

## 2018-06-14 MED ORDER — SODIUM BICARBONATE 650 MG PO TABS: 650.0000 mg | ORAL_TABLET | Freq: Three times a day (TID) | ORAL | 0 refills | Status: DC

## 2018-06-14 NOTE — Discharge Summary (Signed)
Physician Discharge Summary  [name] Slade Sr. OHY:[id number] DOB: 1960-11-14 DOA: 06/12/2018  PCP: Charlott Rakes, MD  Admit date: 06/12/2018 Discharge date: 06/14/2018  Admitted From: home Discharge disposition: home   Recommendations for Outpatient Follow-Up:   1. Alcohol cessation recommended 2. Needs outpatient nephrology follow up for albuminuria 3. Will need ACE/ARB but when re-started, will need close monitoring of his K (? RTA)   Discharge Diagnosis:   Principal Problem:   Acute renal failure superimposed on stage 2 chronic kidney disease (HCC) Active Problems:   Type 2 diabetes mellitus (HCC)   COPD (chronic obstructive pulmonary disease) (HCC)   Hypertension   Anxiety and depression   CAD (coronary artery disease)   Seizure (HCC)   Bilateral lower extremity edema   Hyperkalemia    Discharge Condition: Improved.  Diet recommendation: Low sodium, heart healthy.  Carbohydrate-modified  Wound care: None.  Code status: Full.   History of Present Illness:   [name] Mancera Sr. is a 57 y.o. male with medical history significant for alcohol dependence, COPD, chronic diastolic CHF, neck pain, depression with anxiety, and hypertension, now presenting to the emergency department at the direction of his cardiologist for evaluation of elevated creatinine, potassium, and d-dimer on outpatient blood work.  Patient reports progressive fatigue and worsening lower extremity edema over the past couple months.  Denies any chest pain, increase in his chronic cough or dyspnea, fevers, or chills.  He was seen by his cardiologist yesterday and blood work was obtained, returning with elevated creatinine, hyperkalemia, and d-dimer of 0.79.   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) -FENA is indicative of pre-renal so will hydrate overnight and recheck Cr in AM -BNP 24  Chronic diastolic CHF -Wt appears to be up 20 lbs in last 3 months with increased leg swelling, but no pulm edema or resp complaints, and normal BNP -Per recent cardiology notes, concern edema related to gabapentin use -Follow daily wt and I/O's -  Albumin low so suspect patient is 3rd spacing and hence the edema but intravascularly dry and with diuresis is getting AKI -RUQ U/S w/o signs of cirrhosis  Low albumin -RUQ U/S w/o signs of cirrhosis -from prior studies appears to be from urine/kidneys -needs outpatient follow up  CAD -No anginal complaints -Continue statin  Hypertension -increase clonidine to BID (had been started on daily outpt), hold valsartan until renal function stabilizes and when restarted, will need very close monitoring of his K (suspect RTA)  COPD -Continue ICS/LABA and prn albuterol  Anxiety, depression -Continue Celexa  Type II DM -A1c 6.6% in September -Managed with metformin at home   Seizure disorder -Continue Keppra  Alcohol use - folate/thiamine replacement -drinks at least 1 6 pack of beer/night  Low normal B12 -does have neuropathy type symptoms so will replace to at goal of 450  Medical Consultants:      Discharge Exam:   Vitals:   06/14/18 0530 06/14/18 1219  BP: 126/87 130/78  Pulse: 64 67  Resp: 18 19  Temp: 97.7 F (36.5 C) 97.9 F (36.6 C)  SpO2: 95% 96%   Vitals:   06/13/18 2300 06/14/18 0301 06/14/18 0530 06/14/18 1219  BP: (!) 151/84  126/87 130/78  Pulse: 66  64 67  Resp: 18  18 19   Temp: 97.8 F (36.6 C)  97.7 F (36.5 C) 97.9 F (36.6 C)  TempSrc: Oral  Oral Oral  SpO2: 95%  95% 96%  Weight:  103.2 kg    Height:        General exam: Appears  calm and comfortable.    The results of significant diagnostics from this hospitalization (including imaging, microbiology, ancillary and laboratory) are listed below for reference.     Procedures and Diagnostic Studies:   Dg Chest 2 View  Result Date: 06/12/2018 CLINICAL DATA:  Cough and shortness of breath EXAM: CHEST - 2 VIEW COMPARISON:  Chest CT September 09, 2017 FINDINGS: There is no edema or consolidation. Heart size and pulmonary vascularity are normal. No adenopathy. There is anterior wedging of a lower thoracic vertebral body. There is postoperative change in the lower cervical region. IMPRESSION: No edema or consolidation. Electronically Signed   By: Lowella Grip III M.D.   On: 06/12/2018 17:01   US Renal  Result Date: 06/13/2018 CLINICAL DATA:  Acute kidney injury. EXAM: RENAL / URINARY TRACT ULTRASOUND COMPLETE COMPARISON:  None. FINDINGS: Right Kidney: Renal measurements: 11.4 x 5.5 x 6.2 cm = volume: 206 mL . Echogenicity within normal limits. No mass or hydronephrosis visualized. Left Kidney: Renal measurements: 14.7 x 6.6 x 6.2 cm = volume: 323 mL. Echogenicity within normal limits. No mass or hydronephrosis visualized. Bladder: Appears normal for degree of bladder distention. IMPRESSION: Normal renal ultrasound. Electronically Signed   By: Titus Dubin M.D.   On: 06/13/2018 00:45   Vas [place] Lower Extremity Venous (dvt) (only [facility])  Result Date: 06/12/2018  Lower Venous Study Indications: Edema.  Limitations: Poor ultrasound/tissue interface. Performing Technologist: Oliver Hum RVT  Examination Guidelines: A complete evaluation includes B-mode imaging, spectral Doppler, color Doppler, and power Doppler as needed of all accessible portions of each vessel. Bilateral testing is considered an integral part of a complete examination. Limited examinations for reoccurring indications may be performed as noted.  Right Venous Findings:  +---------+---------------+---------+-----------+----------+-------+          CompressibilityPhasicitySpontaneityPropertiesSummary +---------+---------------+---------+-----------+----------+-------+ CFV      Full           Yes      Yes                          +---------+---------------+---------+-----------+----------+-------+ SFJ      Full                                                 +---------+---------------+---------+-----------+----------+-------+ FV Prox  Full                                                 +---------+---------------+---------+-----------+----------+-------+ FV Mid   Full                                                 +---------+---------------+---------+-----------+----------+-------+ FV DistalFull                                                 +---------+---------------+---------+-----------+----------+-------+  PFV      Full                                                 +---------+---------------+---------+-----------+----------+-------+ POP      Full           Yes      Yes                          +---------+---------------+---------+-----------+----------+-------+ PTV      Full                                                 +---------+---------------+---------+-----------+----------+-------+ PERO     Full                                                 +---------+---------------+---------+-----------+----------+-------+  Left Venous Findings: +---------+---------------+---------+-----------+----------+-------+          CompressibilityPhasicitySpontaneityPropertiesSummary +---------+---------------+---------+-----------+----------+-------+ CFV      Full           Yes      Yes                          +---------+---------------+---------+-----------+----------+-------+ SFJ      Full                                                  +---------+---------------+---------+-----------+----------+-------+ FV Prox  Full                                                 +---------+---------------+---------+-----------+----------+-------+ FV Mid   Full                                                 +---------+---------------+---------+-----------+----------+-------+ FV DistalFull                                                 +---------+---------------+---------+-----------+----------+-------+ PFV      Full                                                 +---------+---------------+---------+-----------+----------+-------+ POP      Full           Yes      Yes                          +---------+---------------+---------+-----------+----------+-------+  PTV      Full                                                 +---------+---------------+---------+-----------+----------+-------+ PERO     Full                                                 +---------+---------------+---------+-----------+----------+-------+    Summary: Right: There is no evidence of deep vein thrombosis in the lower extremity. No cystic structure found in the popliteal fossa. Left: There is no evidence of deep vein thrombosis in the lower extremity. No cystic structure found in the popliteal fossa.  *See table(s) above for measurements and observations. Electronically signed by Deitra Mayo MD on 06/12/2018 at 4:31:16 PM.    Final      Labs:   Basic Metabolic Panel: Recent Labs  Lab 06/11/18 1053 06/12/18 1509 06/13/18 0305 06/14/18 0547  NA 139 135 137 138  K 6.0* 5.4* 4.4 4.3  CL 105 102 106 109  CO2 16* 22 22 18*  GLUCOSE 136* 139* 161* 146*  BUN 34* 29* 28* 26*  CREATININE 1.83* 2.07* 1.89* 1.69*  CALCIUM 9.0 8.8* 8.4* 8.3*   GFR Estimated Creatinine Clearance: 60.3 mL/min (A) (by C-G formula based on SCr of 1.69 mg/dL (H)). Liver Function Tests: Recent Labs  Lab 06/13/18 0305  AST 15  ALT 17    ALKPHOS 43  BILITOT 0.5  PROT 4.7*  ALBUMIN 2.1*   No results for input(s): LIPASE, AMYLASE in the last 168 hours. No results for input(s): AMMONIA in the last 168 hours. Coagulation profile No results for input(s): INR, PROTIME in the last 168 hours.  CBC: Recent Labs  Lab 06/12/18 1509 06/14/18 0547  WBC 11.6* 11.2*  HGB 15.9 14.3  HCT 48.9 41.8  MCV 95.9 94.6  PLT 321 299   Cardiac Enzymes: No results for input(s): CKTOTAL, CKMB, CKMBINDEX, TROPONINI in the last 168 hours. BNP: Invalid input(s): POCBNP CBG: Recent Labs  Lab 06/13/18 1144 06/13/18 1656 06/13/18 2110 06/14/18 0646 06/14/18 1113  GLUCAP 135* 150* 134* 138* 180*   D-Dimer No results for input(s): DDIMER in the last 72 hours. Hgb A1c No results for input(s): HGBA1C in the last 72 hours. Lipid Profile No results for input(s): CHOL, HDL, LDLCALC, TRIG, CHOLHDL, LDLDIRECT in the last 72 hours. Thyroid function studies Recent Labs    06/14/18 1348  TSH 4.523*   Anemia work up Recent Labs    06/13/18 1141  [id number] 220   Microbiology No results found for this or any previous visit (from the past 240 hour(s)).   Discharge Instructions:   Discharge Instructions    Diet - low sodium heart healthy   Complete by:  As directed    Discharge instructions   Complete by:  As directed    Alcohol reduction Fluid restrict to 1.5L Hold diuretic until repeat labs next week   Increase activity slowly   Complete by:  As directed      Allergies as of 06/14/2018   No Known Allergies     Medication List    STOP taking these medications   furosemide 20 MG tablet Commonly known as:  LASIX   nitroGLYCERIN  0.4 MG SL tablet Commonly known as:  NITROSTAT   torsemide 20 MG tablet Commonly known as:  DEMADEX   valsartan 40 MG tablet Commonly known as:  DIOVAN     TAKE these medications   citalopram 20 MG tablet Commonly known as:  CELEXA Take 1 tablet (20 mg total) by mouth daily.    cloNIDine 0.1 MG tablet Commonly known as:  CATAPRES Take 1 tablet (0.1 mg total) by mouth 2 (two) times daily. What changed:    how much to take  how to take this  when to take this  additional instructions   cyanocobalamin 1000 MCG tablet Take 1 tablet (1,000 mcg total) by mouth daily. Start taking on:  June 15, 2018   Fluticasone-Salmeterol 250-50 MCG/DOSE Aepb Commonly known as:  ADVAIR DISKUS Inhale 1 puff into the lungs 2 (two) times daily.   folic acid 1 MG tablet Commonly known as:  FOLVITE Take 1 tablet (1 mg total) by mouth daily. Start taking on:  June 15, 2018   gabapentin 300 MG capsule Commonly known as:  NEURONTIN Take 1 capsule (300 mg total) by mouth 2 (two) times daily.   hydrOXYzine 25 MG tablet Commonly known as:  ATARAX/VISTARIL Take 1 tablet (25 mg total) by mouth 3 (three) times daily as needed. What changed:  reasons to take this   levETIRAcetam 500 MG tablet Commonly known as:  KEPPRA Take 1 tablet (500 mg total) by mouth 2 (two) times daily.   metFORMIN 500 MG tablet Commonly known as:  GLUCOPHAGE Take 1 tablet (500 mg total) by mouth daily with breakfast.   rosuvastatin 10 MG tablet Commonly known as:  CRESTOR Take 1 tablet (10 mg total) by mouth daily.   sodium bicarbonate 650 MG tablet Take 1 tablet (650 mg total) by mouth 3 (three) times daily.   thiamine 100 MG tablet Take 1 tablet (100 mg total) by mouth daily. Start taking on:  June 15, 2018   VENTOLIN HFA 108 (90 Base) MCG/ACT inhaler Generic drug:  albuterol INHALE 2 PUFFS INTO THE LUNGS EVERY 6 HOURS AS NEEDED FOR WHEEZING OR SHORTNESS OF BREATH. What changed:  See the new instructions.      Follow-up Information    Charlott Rakes, MD Follow up in 1 week(s).   Specialty:  Family Medicine Why:  BMP Contact information: [place] [address] [phone]        [facility] Follow up.   Why:  can try making  appointment here for follow up as well Contact information: [address], [address] [place] [address]       Richardo Priest, MD. Schedule an appointment as soon as possible for a visit.   Specialty:  Cardiology Contact information: [place] [address] [phone]            Time coordinating discharge: 25 min  Signed:  Geradine Girt DO  [facility] 06/14/2018, 5:11 PM

## 2018-06-14 NOTE — Discharge Instructions (Signed)
Please get orange card as you need to follow up with nephrology Will need to restart ACE/ARB but will need very close monitoring of your potassium levels

## 2018-06-15 LAB — BASIC METABOLIC PANEL
BUN / CREAT RATIO: 19 (ref 9–20)
BUN: 34 mg/dL — ABNORMAL HIGH (ref 6–24)
CHLORIDE: 105 mmol/L (ref 96–106)
CO2: 16 mmol/L — ABNORMAL LOW (ref 20–29)
CREATININE: 1.83 mg/dL — AB (ref 0.76–1.27)
Calcium: 9 mg/dL (ref 8.7–10.2)
GFR calc Af Amer: 46 mL/min/{1.73_m2} — ABNORMAL LOW (ref >59)
GFR calc non Af Amer: 40 mL/min/{1.73_m2} — ABNORMAL LOW (ref >59)
Glucose: 136 mg/dL — ABNORMAL HIGH (ref 65–99)
Potassium: 6 mmol/L — ABNORMAL HIGH (ref 3.5–5.2)
SODIUM: 139 mmol/L (ref 134–144)

## 2018-06-15 LAB — PRO B NATRIURETIC PEPTIDE

## 2018-06-15 LAB — D-DIMER, QUANTITATIVE: D-DIMER: 0.79 mg/L FEU — ABNORMAL HIGH (ref 0.00–0.49)

## 2018-07-10 ENCOUNTER — Encounter: Payer: Self-pay | Admitting: Family Medicine

## 2018-07-10 ENCOUNTER — Ambulatory Visit: Payer: Medicaid Other | Attending: Family Medicine | Admitting: Family Medicine

## 2018-07-10 VITALS — BP 151/83 | HR 60 | Temp 97.9°F | Ht 73.0 in | Wt 237.0 lb

## 2018-07-10 DIAGNOSIS — J438 Other emphysema: Secondary | ICD-10-CM

## 2018-07-10 DIAGNOSIS — R569 Unspecified convulsions: Secondary | ICD-10-CM | POA: Diagnosis not present

## 2018-07-10 DIAGNOSIS — E1140 Type 2 diabetes mellitus with diabetic neuropathy, unspecified: Secondary | ICD-10-CM | POA: Insufficient documentation

## 2018-07-10 DIAGNOSIS — Z7984 Long term (current) use of oral hypoglycemic drugs: Secondary | ICD-10-CM | POA: Diagnosis not present

## 2018-07-10 DIAGNOSIS — R600 Localized edema: Secondary | ICD-10-CM

## 2018-07-10 DIAGNOSIS — F329 Major depressive disorder, single episode, unspecified: Secondary | ICD-10-CM | POA: Insufficient documentation

## 2018-07-10 DIAGNOSIS — N182 Chronic kidney disease, stage 2 (mild): Secondary | ICD-10-CM | POA: Diagnosis not present

## 2018-07-10 DIAGNOSIS — F32A Depression, unspecified: Secondary | ICD-10-CM

## 2018-07-10 DIAGNOSIS — Z1159 Encounter for screening for other viral diseases: Secondary | ICD-10-CM | POA: Diagnosis not present

## 2018-07-10 DIAGNOSIS — E1122 Type 2 diabetes mellitus with diabetic chronic kidney disease: Secondary | ICD-10-CM

## 2018-07-10 DIAGNOSIS — I129 Hypertensive chronic kidney disease with stage 1 through stage 4 chronic kidney disease, or unspecified chronic kidney disease: Secondary | ICD-10-CM | POA: Diagnosis not present

## 2018-07-10 DIAGNOSIS — E8809 Other disorders of plasma-protein metabolism, not elsewhere classified: Secondary | ICD-10-CM

## 2018-07-10 DIAGNOSIS — J449 Chronic obstructive pulmonary disease, unspecified: Secondary | ICD-10-CM | POA: Diagnosis not present

## 2018-07-10 DIAGNOSIS — Z79899 Other long term (current) drug therapy: Secondary | ICD-10-CM | POA: Diagnosis not present

## 2018-07-10 DIAGNOSIS — E1169 Type 2 diabetes mellitus with other specified complication: Secondary | ICD-10-CM

## 2018-07-10 DIAGNOSIS — F419 Anxiety disorder, unspecified: Secondary | ICD-10-CM

## 2018-07-10 DIAGNOSIS — I2510 Atherosclerotic heart disease of native coronary artery without angina pectoris: Secondary | ICD-10-CM | POA: Diagnosis not present

## 2018-07-10 DIAGNOSIS — G629 Polyneuropathy, unspecified: Secondary | ICD-10-CM

## 2018-07-10 LAB — POCT GLYCOSYLATED HEMOGLOBIN (HGB A1C): HbA1c, POC (controlled diabetic range): 6.6 % (ref 0.0–7.0)

## 2018-07-10 LAB — GLUCOSE, POCT (MANUAL RESULT ENTRY): POC Glucose: 145 mg/dl — AB (ref 70–99)

## 2018-07-10 MED ORDER — METFORMIN HCL 500 MG PO TABS: 500.0000 mg | ORAL_TABLET | Freq: Every day | ORAL | 3 refills | Status: DC

## 2018-07-10 MED ORDER — GABAPENTIN 300 MG PO CAPS: 600.0000 mg | ORAL_CAPSULE | Freq: Two times a day (BID) | ORAL | 3 refills | Status: DC

## 2018-07-10 MED ORDER — CITALOPRAM HYDROBROMIDE 20 MG PO TABS: 20.0000 mg | ORAL_TABLET | Freq: Every day | ORAL | 3 refills | Status: DC

## 2018-07-10 MED ORDER — CLONIDINE HCL 0.1 MG PO TABS: 0.1000 mg | ORAL_TABLET | Freq: Two times a day (BID) | ORAL | 3 refills | Status: DC

## 2018-07-10 MED ORDER — HYDROXYZINE HCL 25 MG PO TABS: 25.0000 mg | ORAL_TABLET | Freq: Three times a day (TID) | ORAL | 3 refills | Status: DC | PRN

## 2018-07-10 MED ORDER — FLUTICASONE-SALMETEROL 250-50 MCG/DOSE IN AEPB: 1.0000 | INHALATION_SPRAY | Freq: Two times a day (BID) | RESPIRATORY_TRACT | 3 refills | Status: DC

## 2018-07-10 MED ORDER — LEVETIRACETAM 500 MG PO TABS: 500.0000 mg | ORAL_TABLET | Freq: Two times a day (BID) | ORAL | 3 refills | Status: DC

## 2018-07-10 NOTE — Patient Instructions (Signed)

## 2018-07-10 NOTE — Progress Notes (Addendum)
Subjective:  Patient ID: [name] Slade Sr., male    DOB: July 21, 1960  Age: 58 y.o. MRN: [id number]  CC: Hospitalization Follow-up and Diabetes   HPI [name] Pinn Sr.  is a 58 year old male with a medical history of of type 2 diabetes mellitus (A1c 6.6), Seizures, alcohol abuse hypertension, alcohol abuse, coronary artery calcifications, ACDF of C4,5 and C5,6 on 09/14/17  By Dr Saintclair Halsted secondary to Myelopathy who presents to the clinic for a follow-up visit. He had a hospitalization from 06/12/2018 through 06/14/2018 for acute on chronic kidney injury after he had presented to the ED at the direction of his cardiologist due to elevated d-dimer, elevated creatinine of 2.07 up from 1.83 previously and early on in the year he was at the baseline of 1.3. He was treated with IV hydration with improvement in creatinine to 1.69 at discharge and Lasix was held.  He presents today and continues to hold his Lasix but has bilateral pedal edema (of note he does have hypoalbuminemia and albuminuria).He complains of burning and tingling all over his body denies shortness of breath or chest pain has an appointment with his cardiologist on 07/20/2018. At a previous visit with cardiology pedal edema was thought to be secondary to gabapentin.  He is doing well from a diabetes standpoint and has been compliant with his medications and he has had no recent seizures. His anxiety and depression have improved.  Past Medical History:  Diagnosis Date  . Anxiety   . CAD (coronary artery disease) 10/06/2017  . COPD (chronic obstructive pulmonary disease) ([place]) 09/29/2017  . Cough   . Depression   . Dyspnea   . 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 Hypertension 09/29/2017  . Myelopathy ([place]) 09/14/2017  . Tobacco abuse 09/29/2017  . Type 2 diabetes mellitus ([place]) 10/17/2011    Past Surgical History:  Procedure Laterality Date  . ANTERIOR CERVICAL DECOMP/DISCECTOMY FUSION N/A 09/14/2017   Procedure: ANTERIOR CERVICAL  DECOMPRESSION/DISCECTOMY FUSIONCERVICAL 4- CERVICAL 5, CERVICAL 5- CERVICAL 6;  Surgeon: Kary Kos, MD;  Location: [facility];  Service: Neurosurgery;  Laterality: N/A;  ANTERIOR CERVICAL DECOMPRESSION/DISCECTOMY FUSIONCERVICAL 4- CERVICAL 5, CERVICAL 5- CERVICAL 6    No Known Allergies   Outpatient Medications Prior to Visit  Medication Sig Dispense Refill  . folic acid (FOLVITE) 1 MG tablet Take 1 tablet (1 mg total) by mouth daily. 30 tablet 0  . VENTOLIN HFA 108 (90 Base) MCG/ACT inhaler INHALE 2 PUFFS INTO THE LUNGS EVERY 6 HOURS AS NEEDED FOR WHEEZING OR SHORTNESS OF BREATH. (Patient taking differently: Inhale 2 puffs into the lungs every 6 (six) hours as needed for wheezing or shortness of breath. ) 18 g 2  . vitamin B-12 1000 MCG tablet Take 1 tablet (1,000 mcg total) by mouth daily. 30 tablet 0  . cloNIDine (CATAPRES) 0.1 MG tablet Take 1 tablet (0.1 mg total) by mouth 2 (two) times daily. 60 tablet 0  . Fluticasone-Salmeterol (ADVAIR DISKUS) 250-50 MCG/DOSE AEPB Inhale 1 puff into the lungs 2 (two) times daily. 1 each 3  . gabapentin (NEURONTIN) 300 MG capsule Take 1 capsule (300 mg total) by mouth 2 (two) times daily. 60 capsule 3  . hydrOXYzine (ATARAX/VISTARIL) 25 MG tablet Take 1 tablet (25 mg total) by mouth 3 (three) times daily as needed. (Patient taking differently: Take 25 mg by mouth 3 (three) times daily as needed for anxiety. ) 60 tablet 1  . metFORMIN (GLUCOPHAGE) 500 MG tablet Take 1 tablet (500 mg total) by mouth daily  with breakfast. 30 tablet 3  . rosuvastatin (CRESTOR) 10 MG tablet Take 1 tablet (10 mg total) by mouth daily. 30 tablet 11  . sodium bicarbonate 650 MG tablet Take 1 tablet (650 mg total) by mouth 3 (three) times daily. (Patient not taking: Reported on 07/10/2018) 6 tablet 0  . thiamine 100 MG tablet Take 1 tablet (100 mg total) by mouth daily. (Patient not taking: Reported on 07/10/2018) 30 tablet 0  . citalopram (CELEXA) 20 MG tablet Take 1 tablet (20 mg total)  by mouth daily. (Patient not taking: Reported on 07/10/2018) 30 tablet 3  . levETIRAcetam (KEPPRA) 500 MG tablet Take 1 tablet (500 mg total) by mouth 2 (two) times daily. 60 tablet 3   No facility-administered medications prior to visit.     ROS Review of Systems  Constitutional: Negative for activity change and appetite change.  HENT: Negative for sinus pressure and sore throat.   Eyes: Negative for visual disturbance.  Respiratory: Negative for cough, chest tightness and shortness of breath.   Cardiovascular: Positive for leg swelling. Negative for chest pain.  Gastrointestinal: Negative for abdominal distention, abdominal pain, constipation and diarrhea.  Endocrine: Negative.   Genitourinary: Negative for dysuria.  Musculoskeletal: Negative for joint swelling and myalgias.  Skin: Negative for rash.  Allergic/Immunologic: Negative.   Neurological: Positive for numbness. Negative for weakness and light-headedness.  Psychiatric/Behavioral: Negative for dysphoric mood and suicidal ideas.    Objective:  BP (!) 151/83   Pulse 60   Temp 97.9 F (36.6 C) (Oral)   Ht '6\' 1"'$  (1.854 m)   Wt 237 lb (107.5 kg)   SpO2 96%   BMI 31.27 kg/m   BP/Weight 07/10/2018 06/14/2018 34/19/6222  Systolic BP 979 892 -  Diastolic BP 83 78 -  Wt. (Lbs) 237 227.52 -  BMI 31.27 - 29.21      Physical Exam Constitutional:      Appearance: He is well-developed.  Cardiovascular:     Rate and Rhythm: Normal rate.     Heart sounds: Normal heart sounds. No murmur.  Pulmonary:     Effort: Pulmonary effort is normal.     Breath sounds: Normal breath sounds. No wheezing or rales.  Chest:     Chest wall: No tenderness.  Abdominal:     General: Bowel sounds are normal. There is no distension.     Palpations: Abdomen is soft. There is no mass.     Tenderness: There is no abdominal tenderness.  Musculoskeletal: Normal range of motion.     Right lower leg: Edema (1+ up to the thighs) present.     Left  lower leg: Edema (1+ up to the thighs) present.  Neurological:     Mental Status: He is alert and oriented to person, place, and time.     Lab Results  Component Value Date   HGBA1C 6.6 07/10/2018    Assessment & Plan:   1. Type 2 diabetes mellitus with stage 2 chronic kidney disease, without long-term current use of insulin (HCC) Controlled with A1c of 6.6 Continue current management - POCT glucose (manual entry) - POCT glycosylated hemoglobin (Hb A1C) - Ambulatory referral to Nephrology - [id number]+EGFR  2. Type 2 diabetes mellitus with other specified complication, without long-term current use of insulin ([place]) See #1 above - metFORMIN (GLUCOPHAGE) 500 MG tablet; Take 1 tablet (500 mg total) by mouth daily with breakfast.  Dispense: 30 tablet; Refill: 3  3. Seizure ([place]) Stable - levETIRAcetam (KEPPRA) 500 MG tablet; Take  1 tablet (500 mg total) by mouth 2 (two) times daily for 30 days.  Dispense: 60 tablet; Refill: 3  4. Anxiety and depression Improved - hydrOXYzine (ATARAX/VISTARIL) 25 MG tablet; Take 1 tablet (25 mg total) by mouth 3 (three) times daily as needed for anxiety.  Dispense: 90 tablet; Refill: 3 - citalopram (CELEXA) 20 MG tablet; Take 1 tablet (20 mg total) by mouth daily.  Dispense: 30 tablet; Refill: 3  5. Other emphysema (HCC) Stable - Fluticasone-Salmeterol (ADVAIR DISKUS) 250-50 MCG/DOSE AEPB; Inhale 1 puff into the lungs 2 (two) times daily.  Dispense: 1 each; Refill: 3  6. Neuropathy Increase gabapentin - gabapentin (NEURONTIN) 300 MG capsule; Take 2 capsules (600 mg total) by mouth 2 (two) times daily.  Dispense: 120 capsule; Refill: 3  7. Pedal edema Likely secondary to hypoalbuminemia and CKD Hold off on Lasix until visit with cardiology  8. Hypoalbuminemia Also has hypoalbuminemia See #7 above  9. Need for hepatitis C screening test - Hepatitis c antibody (reflex)   Meds ordered this encounter  Medications  . metFORMIN (GLUCOPHAGE) 500  MG tablet    Sig: Take 1 tablet (500 mg total) by mouth daily with breakfast.    Dispense:  30 tablet    Refill:  3  . levETIRAcetam (KEPPRA) 500 MG tablet    Sig: Take 1 tablet (500 mg total) by mouth 2 (two) times daily for 30 days.    Dispense:  60 tablet    Refill:  3  . hydrOXYzine (ATARAX/VISTARIL) 25 MG tablet    Sig: Take 1 tablet (25 mg total) by mouth 3 (three) times daily as needed for anxiety.    Dispense:  90 tablet    Refill:  3  . cloNIDine (CATAPRES) 0.1 MG tablet    Sig: Take 1 tablet (0.1 mg total) by mouth 2 (two) times daily.    Dispense:  60 tablet    Refill:  3  . Fluticasone-Salmeterol (ADVAIR DISKUS) 250-50 MCG/DOSE AEPB    Sig: Inhale 1 puff into the lungs 2 (two) times daily.    Dispense:  1 each    Refill:  3  . citalopram (CELEXA) 20 MG tablet    Sig: Take 1 tablet (20 mg total) by mouth daily.    Dispense:  30 tablet    Refill:  3  . gabapentin (NEURONTIN) 300 MG capsule    Sig: Take 2 capsules (600 mg total) by mouth 2 (two) times daily.    Dispense:  120 capsule    Refill:  3    Dose change    Follow-up: Return in about 3 months (around 10/09/2018) for Follow-up of chronic medical conditions.   Charlott Rakes MD

## 2018-07-11 LAB — CMP14+EGFR
ALT: 12 IU/L (ref 0–44)
AST: 13 IU/L (ref 0–40)
Albumin/Globulin Ratio: 1.2 (ref 1.2–2.2)
Albumin: 3 g/dL — ABNORMAL LOW (ref 3.5–5.5)
Alkaline Phosphatase: 72 IU/L (ref 39–117)
BUN/Creatinine Ratio: 11 (ref 9–20)
BUN: 19 mg/dL (ref 6–24)
Bilirubin Total: 0.3 mg/dL (ref 0.0–1.2)
CO2: 21 mmol/L (ref 20–29)
Calcium: 8.9 mg/dL (ref 8.7–10.2)
Chloride: 101 mmol/L (ref 96–106)
Creatinine, Ser: 1.7 mg/dL — ABNORMAL HIGH (ref 0.76–1.27)
GFR calc Af Amer: 50 mL/min/1.73 — ABNORMAL LOW
GFR calc non Af Amer: 43 mL/min/1.73 — ABNORMAL LOW
Globulin, Total: 2.6 g/dL (ref 1.5–4.5)
Glucose: 127 mg/dL — ABNORMAL HIGH (ref 65–99)
Potassium: 5.7 mmol/L — ABNORMAL HIGH (ref 3.5–5.2)
Sodium: 136 mmol/L (ref 134–144)
Total Protein: 5.6 g/dL — ABNORMAL LOW (ref 6.0–8.5)

## 2018-07-11 LAB — HEPATITIS C ANTIBODY (REFLEX): HCV Ab: 0.1 {s_co_ratio} (ref 0.0–0.9)

## 2018-07-11 LAB — HCV COMMENT:

## 2018-07-19 NOTE — Progress Notes (Signed)
Cardiology Office Note:    Date:  07/20/2018   ID:  [name] Slade Sr., DOB 1961-03-05, MRN [id number]  PCP:  Charlott Rakes, MD  Cardiologist:  Shirlee More, MD    Referring MD: Charlott Rakes, MD    ASSESSMENT:    1. Essential hypertension   2. Chronic kidney disease, unspecified CKD stage   3. Hyperkalemia    PLAN:    In order of problems listed above:  1. Stable continue current therapy with his CKD and hyperkalemia presently is taking centrally active clonidine.  If potassium is stable on a resin binder and renal function preserved we could place him on a minimum dose of diuretic that help with his edema stage and with control of hypertension. 2. Stage III CKD his baseline hyperkalemia marked edema state related to gabapentin.  Start a resin binder recheck labs in 2 weeks and I told him to discontinue his metformin until seen by his PCP 3. Initiate present monitor   Next appointment: 3 months   Medication Adjustments/Labs and Tests Ordered: Current medicines are reviewed at length with the patient today.  Concerns regarding medicines are outlined above.  No orders of the defined types were placed in this encounter.  No orders of the defined types were placed in this encounter.   Chief Complaint  Patient presents with  . Hypertension    History of Present Illness:    [name] Farver Sr. is a 58 y.o. male with a hx of CAC chest pain with a normal MPI hypertension, emphysema and edema with CCB  last seen 06/11/18. His D Dimer was elevated and lower extremity duplex was normal. Compliance with diet, lifestyle and medications: Yes  He is frustrated his gabapentin dose was increased and is developed anasarca related gabapentin he has stage III CKD he has baseline hyperkalemia and I think the only solution to his problem is to decrease and discontinue.  Unfortunately has a chronic pain syndrome and he is awaiting Medicaid Medicare benefits for further evaluation.  He has  mild diabetes A1c 6.6 and is on metformin is relatively contraindicated and I told him to stop.  I do not think he has congestive heart failure and has no evidence of obstructive CAD despite coronary artery calcification.  I will start him on a resin binder for his hyperkalemia follow-up labs in 2 weeks and if his potassium is well controlled we can consider giving him a minimal dose of a diuretic following his renal function closely. Past Medical History:  Diagnosis Date  . Anxiety   . CAD (coronary artery disease) 10/06/2017  . COPD (chronic obstructive pulmonary disease) ([place]) 09/29/2017  . Cough   . Depression   . Dyspnea   . 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 Hypertension 09/29/2017  . Myelopathy ([place]) 09/14/2017  . Tobacco abuse 09/29/2017  . Type 2 diabetes mellitus ([place]) 10/17/2011    Past Surgical History:  Procedure Laterality Date  . ANTERIOR CERVICAL DECOMP/DISCECTOMY FUSION N/A 09/14/2017   Procedure: ANTERIOR CERVICAL DECOMPRESSION/DISCECTOMY FUSIONCERVICAL 4- CERVICAL 5, CERVICAL 5- CERVICAL 6;  Surgeon: Kary Kos, MD;  Location: [facility];  Service: Neurosurgery;  Laterality: N/A;  ANTERIOR CERVICAL DECOMPRESSION/DISCECTOMY FUSIONCERVICAL 4- CERVICAL 5, CERVICAL 5- CERVICAL 6    Current Medications: Current Meds  Medication Sig  . citalopram (CELEXA) 20 MG tablet Take 1 tablet (20 mg total) by mouth daily.  . cloNIDine (CATAPRES) 0.1 MG tablet Take 1 tablet (0.1 mg total) by mouth 2 (two) times daily.  . Fluticasone-Salmeterol (ADVAIR DISKUS)  250-50 MCG/DOSE AEPB Inhale 1 puff into the lungs 2 (two) times daily.  Marland Kitchen gabapentin (NEURONTIN) 300 MG capsule Take 2 capsules (600 mg total) by mouth 2 (two) times daily.  . hydrOXYzine (ATARAX/VISTARIL) 25 MG tablet Take 1 tablet (25 mg total) by mouth 3 (three) times daily as needed for anxiety.  . levETIRAcetam (KEPPRA) 500 MG tablet Take 1 tablet (500 mg total) by mouth 2 (two) times daily for 30 days.  . metFORMIN (GLUCOPHAGE) 500 MG tablet  Take 1 tablet (500 mg total) by mouth daily with breakfast.  . rosuvastatin (CRESTOR) 10 MG tablet Take 1 tablet (10 mg total) by mouth daily.  Marland Kitchen thiamine 100 MG tablet Take 1 tablet (100 mg total) by mouth daily.  . VENTOLIN HFA 108 (90 Base) MCG/ACT inhaler INHALE 2 PUFFS INTO THE LUNGS EVERY 6 HOURS AS NEEDED FOR WHEEZING OR SHORTNESS OF BREATH. (Patient taking differently: Inhale 2 puffs into the lungs every 6 (six) hours as needed for wheezing or shortness of breath. )  . vitamin B-12 1000 MCG tablet Take 1 tablet (1,000 mcg total) by mouth daily.     Allergies:   Patient has no known allergies.   Social History   Socioeconomic History  . Marital status: Single    Spouse name: Not on file  . Number of children: Not on file  . Years of education: Not on file  . Highest education level: Not on file  Occupational History  . Not on file  Social Needs  . Financial resource strain: Not on file  . Food insecurity:    Worry: Not on file    Inability: Not on file  . Transportation needs:    Medical: Not on file    Non-medical: Not on file  Tobacco Use  . Smoking status: Current Every Day Smoker    Packs/day: 2.00    Years: 42.00    Pack years: 84.00  . Smokeless tobacco: Never Used  Substance and Sexual Activity  . Alcohol use: Yes  . Drug use: No  . Sexual activity: Not on file  Lifestyle  . Physical activity:    Days per week: Not on file    Minutes per session: Not on file  . Stress: Not on file  Relationships  . Social connections:    Talks on phone: Not on file    Gets together: Not on file    Attends religious service: Not on file    Active member of club or organization: Not on file    Attends meetings of clubs or organizations: Not on file    Relationship status: Not on file  Other Topics Concern  . Not on file  Social History Narrative  . Not on file     Family History: The patient's family history includes Diabetes in his mother; Hypertension in his  brother; Kidney cancer in his father; Lung cancer in his father; Scoliosis in his mother. ROS:   Please see the history of present illness.    All other systems reviewed and are negative.  EKGs/Labs/Other Studies Reviewed:    The following studies were reviewed today:  EKG:  EKG ordered today.  The ekg ordered today demonstrates sinus rhythm no evidence of hyperkalemia by EKG  Recent Labs: 10/17/2017: Magnesium 2.0 06/11/2018: NT-Pro BNP CANCELED 06/12/2018: B Natriuretic Peptide 24.4 06/14/2018: Hemoglobin 14.3; Platelets 299; TSH 4.523 07/10/2018: ALT 12; BUN 19; Creatinine, Ser 1.70; Potassium 5.7; Sodium 136  Recent Lipid Panel    Component Value  Date/Time   CHOL 330 (H) 12/05/2017 1635   TRIG 107 12/05/2017 1635   HDL 66 12/05/2017 1635   CHOLHDL 5.0 12/05/2017 1635   LDLCALC 243 (H) 12/05/2017 1635    Physical Exam:    VS:  BP (!) 144/84 (BP Location: Right Arm, Patient Position: Sitting, Cuff Size: Large)   Pulse 67   Ht 6\' 1"  (1.854 m)   Wt 247 lb 12.8 oz (112.4 kg)   SpO2 96%   BMI 32.69 kg/m     Wt Readings from Last 3 Encounters:  07/20/18 247 lb 12.8 oz (112.4 kg)  07/10/18 237 lb (107.5 kg)  06/14/18 227 lb 8.2 oz (103.2 kg)     GEN: He looks quite chronically ill flat affect well nourished, well developed in no acute distress HEENT: Normal NECK: No JVD; No carotid bruits LYMPHATICS: No lymphadenopathy CARDIAC: RRR, no murmurs, rubs, gallops RESPIRATORY:  Clear to auscultation without rales, wheezing or rhonchi  ABDOMEN: Soft, non-tender, non-distended MUSCULOSKELETAL: He has greater than 4+ doughy edema to his thighs as well as presacral edema; No deformity  SKIN: Warm and dry NEUROLOGIC:  Alert and oriented x 3 PSYCHIATRIC:  Normal affect    Signed, Shirlee More, MD  07/20/2018 9:07 AM    [place]

## 2018-07-20 ENCOUNTER — Ambulatory Visit (INDEPENDENT_AMBULATORY_CARE_PROVIDER_SITE_OTHER): Payer: Self-pay | Admitting: Cardiology

## 2018-07-20 VITALS — BP 144/84 | HR 67 | Ht 73.0 in | Wt 247.8 lb

## 2018-07-20 DIAGNOSIS — R609 Edema, unspecified: Secondary | ICD-10-CM | POA: Insufficient documentation

## 2018-07-20 DIAGNOSIS — N189 Chronic kidney disease, unspecified: Secondary | ICD-10-CM

## 2018-07-20 DIAGNOSIS — I10 Essential (primary) hypertension: Secondary | ICD-10-CM

## 2018-07-20 DIAGNOSIS — E875 Hyperkalemia: Secondary | ICD-10-CM

## 2018-07-20 HISTORY — DX: Chronic kidney disease, unspecified: N18.9

## 2018-07-20 HISTORY — DX: Edema, unspecified: R60.9

## 2018-07-20 MED ORDER — SODIUM ZIRCONIUM CYCLOSILICATE 10 G PO PACK: 10.0000 g | PACK | Freq: Every day | ORAL | 3 refills | Status: DC

## 2018-07-20 NOTE — Patient Instructions (Signed)
Medication Instructions:  Your physician has recommended you make the following change in your medication:   STOP metformin  DECREASE gabapentin (neurotonin) 300 mg: Take 1 capsule twice daily for 1-2 weeks then STOP this medication  START lokelma 10 gram: Mix 1 packet in water daily and drink  If you need a refill on your cardiac medications before your next appointment, please call your pharmacy.   Lab work: Your physician recommends that you return for lab work in 2 weeks: BMP. Please return to our office for lab work, no appointment needed. No need to fast beforehand. Our office is open M-F 8-5, please avoid 12-1 for lunch.    If you have labs (blood work) drawn today and your tests are completely normal, you will receive your results only by: Marland Kitchen MyChart Message (if you have MyChart) OR . A paper copy in the mail If you have any lab test that is abnormal or we need to change your treatment, we will call you to review the results.  Testing/Procedures: You had an EKG today.   Follow-Up: At [facility], you and your health needs are our priority.  As part of our continuing mission to provide you with exceptional heart care, we have created designated Provider Care Teams.  These Care Teams include your primary Cardiologist (physician) and Advanced Practice Providers (APPs -  Physician Assistants and Nurse Practitioners) who all work together to provide you with the care you need, when you need it. You will need a follow up appointment in 3 months.  Please call our office 2 months in advance to schedule this appointment.      Sodium Zirconium Cyclosilicate oral suspension What is this medicine? SODIUM ZIRCONIUM CYCLOSILICATE (SOE dee um zir KOE nee um SYE kloe SIL i kate) takes potassium out of the body by binding to it in the intestines. It is used to treat too much potassium in the body. This medicine may be used for other purposes; ask your health care provider or pharmacist if  you have questions. COMMON BRAND NAME(S): LOKELMA What should I tell my health care provider before I take this medicine? They need to know if you have any of these conditions: -blockage in your bowel -diet low in salt -dry hard stool that will not pass out of your rectum -heart disease -kidney disease -an unusual or allergic reaction to sodium zirconium cyclosilicate, other medicines, foods, dyes, or preservatives -pregnant or trying to get pregnant -breast-feeding How should I use this medicine? Take this medicine by mouth. Follow the directions on the prescription label. Take other medicines at least 2 hours before or 2 hours after this medicine. Take your medicine at regular intervals. Do not take it more often than directed. Do not stop taking except on your doctor's advice. Talk to your pediatrician regarding the use of this medicine in children. Special care may be needed. Overdosage: If you think you have taken too much of this medicine contact a poison control center or emergency room at once. NOTE: This medicine is only for you. Do not share this medicine with others. What if I miss a dose? If you miss a dose, take it as soon as you can. If it is almost time for your next dose, take only that dose. Do not take double or extra doses. What may interact with this medicine? Take other oral medicines 2 hours before or 2 hours after this medicine. This list may not describe all possible interactions. Give your health care  provider a list of all the medicines, herbs, non-prescription drugs, or dietary supplements you use. Also tell them if you smoke, drink alcohol, or use illegal drugs. Some items may interact with your medicine. What should I watch for while using this medicine? Tell your doctor or healthcare professional if your symptoms do not start to get better or if they get worse. You may need blood work done while you are taking this medicine. What side effects may I notice from  receiving this medicine? Side effects that you should report to your doctor or health care professional as soon as possible: -allergic reactions like skin rash, itching or hives, swelling of the face, lips, or tongue -signs and symptoms of low potassium like muscle cramps or muscle pain; chest pain; dizziness; feeling faint or lightheaded, falls; palpitations; breathing problems; or fast, irregular heartbeat -swelling of ankles, feet, hands This list may not describe all possible side effects. Call your doctor for medical advice about side effects. You may report side effects to FDA at 1-800-FDA-1088. Where should I keep my medicine? Keep out of the reach of children. Store at room temperature between 15 and 30 degrees C (59 and 86 degrees F). Throw away any unused medicine after the expiration date. NOTE: This sheet is a summary. It may not cover all possible information. If you have questions about this medicine, talk to your doctor, pharmacist, or health care provider.  2019 Elsevier/Gold Standard (2016-11-14 14:17:48)

## 2018-08-09 ENCOUNTER — Other Ambulatory Visit: Payer: Self-pay | Admitting: *Deleted

## 2018-08-09 DIAGNOSIS — N189 Chronic kidney disease, unspecified: Secondary | ICD-10-CM

## 2018-08-09 DIAGNOSIS — E875 Hyperkalemia: Secondary | ICD-10-CM

## 2018-08-10 LAB — BASIC METABOLIC PANEL
BUN/Creatinine Ratio: 10 (ref 9–20)
BUN: 17 mg/dL (ref 6–24)
CO2: 22 mmol/L (ref 20–29)
Calcium: 8.4 mg/dL — ABNORMAL LOW (ref 8.7–10.2)
Chloride: 102 mmol/L (ref 96–106)
Creatinine, Ser: 1.71 mg/dL — ABNORMAL HIGH (ref 0.76–1.27)
GFR calc Af Amer: 50 mL/min/{1.73_m2} — ABNORMAL LOW (ref >59)
GFR, EST NON AFRICAN AMERICAN: 43 mL/min/{1.73_m2} — AB (ref >59)
Glucose: 171 mg/dL — ABNORMAL HIGH (ref 65–99)
Potassium: 5.4 mmol/L — ABNORMAL HIGH (ref 3.5–5.2)
Sodium: 138 mmol/L (ref 134–144)

## 2018-08-13 ENCOUNTER — Telehealth: Payer: Self-pay

## 2018-08-13 DIAGNOSIS — E875 Hyperkalemia: Secondary | ICD-10-CM

## 2018-08-13 DIAGNOSIS — E1169 Type 2 diabetes mellitus with other specified complication: Secondary | ICD-10-CM

## 2018-08-13 DIAGNOSIS — N189 Chronic kidney disease, unspecified: Secondary | ICD-10-CM

## 2018-08-13 MED ORDER — SODIUM ZIRCONIUM CYCLOSILICATE 10 G PO PACK: 10.0000 g | PACK | ORAL | Status: DC

## 2018-08-13 NOTE — Telephone Encounter (Signed)
Patient aware of lab results with mildly elevated potassium level per Dr Bettina Gavia.  Patient advised to take lokelma 10mg  on Monday, Wednesday, and Friday and recheck levels in 2 weeks.   Patient had been previously taking lokelma, but had to be out of town due to death in the family, and was not able to continue taking medication.  Patient aware that samples have been left at front desk for pick up.  Patient agreed to plan and verbalized understanding.

## 2018-09-07 LAB — BASIC METABOLIC PANEL
BUN / CREAT RATIO: 8 — AB (ref 9–20)
BUN: 15 mg/dL (ref 6–24)
CO2: 19 mmol/L — ABNORMAL LOW (ref 20–29)
Calcium: 8.3 mg/dL — ABNORMAL LOW (ref 8.7–10.2)
Chloride: 103 mmol/L (ref 96–106)
Creatinine, Ser: 1.8 mg/dL — ABNORMAL HIGH (ref 0.76–1.27)
GFR calc non Af Amer: 40 mL/min/{1.73_m2} — ABNORMAL LOW (ref >59)
GFR, EST AFRICAN AMERICAN: 47 mL/min/{1.73_m2} — AB (ref >59)
Glucose: 175 mg/dL — ABNORMAL HIGH (ref 65–99)
Potassium: 4.8 mmol/L (ref 3.5–5.2)
Sodium: 135 mmol/L (ref 134–144)

## 2018-09-10 ENCOUNTER — Ambulatory Visit: Payer: Self-pay | Admitting: Cardiology

## 2018-09-10 NOTE — Progress Notes (Deleted)
Cardiology Office Note:    Date:  09/10/2018   ID:  [name] Slade Sr., DOB Nov 10, 1960, MRN [id number]  PCP:  Charlott Rakes, MD  Cardiologist:  Shirlee More, MD    Referring MD: Charlott Rakes, MD    ASSESSMENT:    No diagnosis found. PLAN:    In order of problems listed above:  1. ***   Next appointment: ***   Medication Adjustments/Labs and Tests Ordered: Current medicines are reviewed at length with the patient today.  Concerns regarding medicines are outlined above.  No orders of the defined types were placed in this encounter.  No orders of the defined types were placed in this encounter.   No chief complaint on file.   History of Present Illness:    [name] Marling Sr. is a 58 y.o. male with a hx of coronary artery calcification with normal myocardial perfusion study stage III CKD type 2 diabetes anasarca precipitated by gabapentin and hyperkalemia.  He was last seen 07/20/2018 and was initiate on [place] for hyperkalemia Compliance with diet, lifestyle and medications: *** Past Medical History:  Diagnosis Date  . Anxiety   . CAD (coronary artery disease) 10/06/2017  . COPD (chronic obstructive pulmonary disease) ([place]) 09/29/2017  . Cough   . Depression   . Dyspnea   . 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 Hypertension 09/29/2017  . Myelopathy ([place]) 09/14/2017  . Tobacco abuse 09/29/2017  . Type 2 diabetes mellitus ([place]) 10/17/2011    Past Surgical History:  Procedure Laterality Date  . ANTERIOR CERVICAL DECOMP/DISCECTOMY FUSION N/A 09/14/2017   Procedure: ANTERIOR CERVICAL DECOMPRESSION/DISCECTOMY FUSIONCERVICAL 4- CERVICAL 5, CERVICAL 5- CERVICAL 6;  Surgeon: Kary Kos, MD;  Location: [facility];  Service: Neurosurgery;  Laterality: N/A;  ANTERIOR CERVICAL DECOMPRESSION/DISCECTOMY FUSIONCERVICAL 4- CERVICAL 5, CERVICAL 5- CERVICAL 6    Current Medications: No outpatient medications have been marked as taking for the 09/11/18 encounter (Appointment) with Richardo Priest,  MD.     Allergies:   Patient has no known allergies.   Social History   Socioeconomic History  . Marital status: Single    Spouse name: Not on file  . Number of children: Not on file  . Years of education: Not on file  . Highest education level: Not on file  Occupational History  . Not on file  Social Needs  . Financial resource strain: Not on file  . Food insecurity:    Worry: Not on file    Inability: Not on file  . Transportation needs:    Medical: Not on file    Non-medical: Not on file  Tobacco Use  . Smoking status: Current Every Day Smoker    Packs/day: 2.00    Years: 42.00    Pack years: 84.00  . Smokeless tobacco: Never Used  Substance and Sexual Activity  . Alcohol use: Yes  . Drug use: No  . Sexual activity: Not on file  Lifestyle  . Physical activity:    Days per week: Not on file    Minutes per session: Not on file  . Stress: Not on file  Relationships  . Social connections:    Talks on phone: Not on file    Gets together: Not on file    Attends religious service: Not on file    Active member of club or organization: Not on file    Attends meetings of clubs or organizations: Not on file    Relationship status: Not on file  Other Topics Concern  . Not  on file  Social History Narrative  . Not on file     Family History: The patient's ***family history includes Diabetes in his mother; Hypertension in his brother; Kidney cancer in his father; Lung cancer in his father; Scoliosis in his mother. ROS:   Please see the history of present illness.    All other systems reviewed and are negative.  EKGs/Labs/Other Studies Reviewed:    The following studies were reviewed today:  EKG:  EKG ordered today and personally reviewed.  The ekg ordered today demonstrates ***  Recent Labs: 10/17/2017: Magnesium 2.0 06/11/2018: NT-Pro BNP CANCELED 06/12/2018: B Natriuretic Peptide 24.4 06/14/2018: Hemoglobin 14.3; Platelets 299; TSH 4.523 07/10/2018: ALT 12  09/06/2018: BUN 15; Creatinine, Ser 1.80; Potassium 4.8; Sodium 135  Recent Lipid Panel    Component Value Date/Time   CHOL 330 (H) 12/05/2017 1635   TRIG 107 12/05/2017 1635   HDL 66 12/05/2017 1635   CHOLHDL 5.0 12/05/2017 1635   LDLCALC 243 (H) 12/05/2017 1635    Physical Exam:    VS:  There were no vitals taken for this visit.    Wt Readings from Last 3 Encounters:  07/20/18 247 lb 12.8 oz (112.4 kg)  07/10/18 237 lb (107.5 kg)  06/14/18 227 lb 8.2 oz (103.2 kg)     GEN: *** Well nourished, well developed in no acute distress HEENT: Normal NECK: No JVD; No carotid bruits LYMPHATICS: No lymphadenopathy CARDIAC: ***RRR, no murmurs, rubs, gallops RESPIRATORY:  Clear to auscultation without rales, wheezing or rhonchi  ABDOMEN: Soft, non-tender, non-distended MUSCULOSKELETAL:  No edema; No deformity  SKIN: Warm and dry NEUROLOGIC:  Alert and oriented x 3 PSYCHIATRIC:  Normal affect    Signed, Shirlee More, MD  09/10/2018 7:30 PM    [place]

## 2018-09-11 ENCOUNTER — Ambulatory Visit: Payer: Medicaid Other | Admitting: Cardiology

## 2018-09-11 ENCOUNTER — Telehealth: Payer: Self-pay

## 2018-09-11 MED ORDER — HYDRALAZINE HCL 25 MG PO TABS: 12.5000 mg | ORAL_TABLET | Freq: Two times a day (BID) | ORAL | 3 refills | Status: DC

## 2018-09-11 NOTE — Telephone Encounter (Signed)
Add hydralazine 25 mg tab 1/2 = 12.5 mg BID disp 60 refill 3

## 2018-09-11 NOTE — Telephone Encounter (Signed)
Called to reschedule appt. Pt states this is fine however, he has been dealing with his BP being elevated. Pt is currently taking Clonidine and this doesn't seem to help. Some BP reading were 176/105, 175/103, 178/102 and this morning before taking medication it was 160/100. Informed pt that I would make Dr. Bettina Gavia and his nurse aware of this. Pt verbalized understanding.

## 2018-09-12 NOTE — Telephone Encounter (Signed)
Left message to return call 

## 2018-09-12 NOTE — Telephone Encounter (Signed)
Patient advised to start hydralazine 25mg  1/2 tablet (12.5 mg ) twice daily.  Patient aware that Rx has been sent to pharmacy. Patient advised to continue to monitor blood pressures at home, and to contact our office with any questions or concerns.  Patient agreed to plan and verbalized understanding.

## 2018-09-17 ENCOUNTER — Telehealth: Payer: Self-pay | Admitting: Cardiology

## 2018-09-17 NOTE — Telephone Encounter (Signed)
Patient returning your call regarding his BP. Patient states his BP yesterday was 190/110 and should he increase his medication?

## 2018-09-18 NOTE — Telephone Encounter (Signed)
Returned call to patient regarding his home blood pressure readings.  Patient reports blood pressure readings of 170/100 yesterday, and this morning his blood pressure is 179/105.  Verified his current medications and he has not been taking clonidine 0.1mg  one tablet twice daily.  Dr Bettina Gavia aware of home blood pressure readings and advised that patient should restart clonidine 0.1mg  one tablet twice daily as well as hydralazine 25mg  0.5 tablet twice daily.   Patient will continue to monitor blood pressure at home, and contact our office with any questions or concerns.  Patient agreed to plan and verbalized understanding.

## 2018-10-16 ENCOUNTER — Telehealth: Payer: Self-pay

## 2018-10-16 NOTE — Telephone Encounter (Signed)
Left message for patient to return call to schedule virtual visit as he is Due to see Dr Bettina Gavia in April.

## 2018-10-17 ENCOUNTER — Ambulatory Visit: Payer: Medicaid Other | Attending: Family Medicine | Admitting: Family Medicine

## 2018-10-17 ENCOUNTER — Encounter: Payer: Self-pay | Admitting: Family Medicine

## 2018-10-17 ENCOUNTER — Other Ambulatory Visit: Payer: Self-pay

## 2018-10-17 DIAGNOSIS — I10 Essential (primary) hypertension: Secondary | ICD-10-CM

## 2018-10-17 DIAGNOSIS — Z720 Tobacco use: Secondary | ICD-10-CM

## 2018-10-17 DIAGNOSIS — N183 Chronic kidney disease, stage 3 unspecified: Secondary | ICD-10-CM

## 2018-10-17 DIAGNOSIS — F32A Depression, unspecified: Secondary | ICD-10-CM

## 2018-10-17 DIAGNOSIS — R569 Unspecified convulsions: Secondary | ICD-10-CM

## 2018-10-17 DIAGNOSIS — F329 Major depressive disorder, single episode, unspecified: Secondary | ICD-10-CM

## 2018-10-17 DIAGNOSIS — J438 Other emphysema: Secondary | ICD-10-CM | POA: Diagnosis not present

## 2018-10-17 DIAGNOSIS — F419 Anxiety disorder, unspecified: Secondary | ICD-10-CM | POA: Diagnosis not present

## 2018-10-17 DIAGNOSIS — E1169 Type 2 diabetes mellitus with other specified complication: Secondary | ICD-10-CM

## 2018-10-17 MED ORDER — CITALOPRAM HYDROBROMIDE 20 MG PO TABS: 20.0000 mg | ORAL_TABLET | Freq: Every day | ORAL | 3 refills | Status: DC

## 2018-10-17 MED ORDER — ROSUVASTATIN CALCIUM 10 MG PO TABS: 10.0000 mg | ORAL_TABLET | Freq: Every day | ORAL | 3 refills | Status: DC

## 2018-10-17 MED ORDER — GLUCOSE BLOOD VI STRP: ORAL_STRIP | 12 refills | Status: DC

## 2018-10-17 MED ORDER — ACCU-CHEK AVIVA DEVI: 0 refills | Status: DC

## 2018-10-17 MED ORDER — ACCU-CHEK SOFT TOUCH LANCETS MISC: 12 refills | Status: DC

## 2018-10-17 MED ORDER — HYDRALAZINE HCL 25 MG PO TABS: 25.0000 mg | ORAL_TABLET | Freq: Two times a day (BID) | ORAL | 3 refills | Status: DC

## 2018-10-17 MED ORDER — FLUTICASONE-SALMETEROL 250-50 MCG/DOSE IN AEPB: 1.0000 | INHALATION_SPRAY | Freq: Two times a day (BID) | RESPIRATORY_TRACT | 3 refills | Status: DC

## 2018-10-17 MED ORDER — CLONIDINE HCL 0.1 MG PO TABS: 0.1000 mg | ORAL_TABLET | Freq: Two times a day (BID) | ORAL | 3 refills | Status: DC

## 2018-10-17 MED ORDER — LEVETIRACETAM 500 MG PO TABS: 500.0000 mg | ORAL_TABLET | Freq: Two times a day (BID) | ORAL | 3 refills | Status: DC

## 2018-10-17 MED ORDER — HYDROXYZINE HCL 25 MG PO TABS: 25.0000 mg | ORAL_TABLET | Freq: Three times a day (TID) | ORAL | 3 refills | Status: DC | PRN

## 2018-10-17 NOTE — Telephone Encounter (Signed)
Patient returned call to [facility] on 10-16-2018 for appointment on 11-01-2018 with Dr Bettina Gavia. Attempted to reach patient to get consent for virtual visit, but there was no answer.  Will send in [place].

## 2018-10-17 NOTE — Progress Notes (Signed)
Virtual Visit via Telephone Note  I connected with [name] Cutbirth Sr., on 10/17/2018 at 10:33 AM by telephone and verified that I am speaking with the correct person using two identifiers.   Consent: I discussed the limitations, risks, security and privacy concerns of performing an evaluation and management service by telephone and the availability of in person appointments. I also discussed with the patient that there may be a patient responsible charge related to this service. The patient expressed understanding and agreed to proceed.   Location of Patient: Patient's home  Location of Provider: Clinic   Persons participating in Telemedicine visit: [name] Londo Sr.  [name] [name] Dr. Felecia Shelling     History of Present Illness: [name] Abril Sr.  is a 58 year old male with a medical history of of type 2 diabetes mellitus (A1c 6.6), Seizures, chronic kidney disease, alcohol abuse hypertension, alcohol abuse, coronary artery calcifications, ACDF of C4,5 and C5,6 on 09/14/17  By Dr [name] [name] secondary to Myelopathy who presents to the clinic for a follow-up visit.  He was seen by cardiology on 07/20/2018 at which time his gabapentin which he was taking for diabetic neuropathy was discontinued due to gabapentin related anasarca.  He reports improvement in his pedal edema since discontinuation however he continues to suffer from diabetic neuropathy but states he would try to cope with it. He declines Cymbalta because "it did not do me well". Metformin was also discontinued at that visit.  He no longer needs a cane to ambulate. He has had hyperkalemia on his labs and was commenced on a resin binder by cardiology; last potassium was 4.8 in 09/06/2018. I had referred him to nephrology whom he was unable to see due to lack of medical coverage but he now has Medicaid.  He has not had recent seizures and has been compliant with all his medications.  He gives me a report of his blood pressures which  were 150/96 and 174/98 over the last couple of days. He continues to smoke.    Past Medical History:  Diagnosis Date  . Anxiety   . CAD (coronary artery disease) 10/06/2017  . COPD (chronic obstructive pulmonary disease) ([place]) 09/29/2017  . Cough   . Depression   . Dyspnea   . 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 Hypertension 09/29/2017  . Myelopathy ([place]) 09/14/2017  . Tobacco abuse 09/29/2017  . Type 2 diabetes mellitus ([place]) 10/17/2011   No Known Allergies  Current Outpatient Medications on File Prior to Visit  Medication Sig Dispense Refill  . citalopram (CELEXA) 20 MG tablet Take 1 tablet (20 mg total) by mouth daily. 30 tablet 3  . cloNIDine (CATAPRES) 0.1 MG tablet Take 1 tablet (0.1 mg total) by mouth 2 (two) times daily. 60 tablet 3  . Fluticasone-Salmeterol (ADVAIR DISKUS) 250-50 MCG/DOSE AEPB Inhale 1 puff into the lungs 2 (two) times daily. 1 each 3  . hydrALAZINE (APRESOLINE) 25 MG tablet Take 0.5 tablets (12.5 mg total) by mouth 2 (two) times daily. 60 tablet 3  . hydrOXYzine (ATARAX/VISTARIL) 25 MG tablet Take 1 tablet (25 mg total) by mouth 3 (three) times daily as needed for anxiety. 90 tablet 3  . sodium zirconium cyclosilicate (LOKELMA) 10 g PACK packet Take 10 g by mouth as directed. Take on Monday, Wednesday, and Friday    . thiamine 100 MG tablet Take 1 tablet (100 mg total) by mouth daily. 30 tablet 0  . VENTOLIN HFA 108 (90 Base) MCG/ACT inhaler INHALE 2 PUFFS INTO THE LUNGS EVERY  6 HOURS AS NEEDED FOR WHEEZING OR SHORTNESS OF BREATH. (Patient taking differently: Inhale 2 puffs into the lungs every 6 (six) hours as needed for wheezing or shortness of breath. ) 18 g 2  . vitamin B-12 1000 MCG tablet Take 1 tablet (1,000 mcg total) by mouth daily. 30 tablet 0  . levETIRAcetam (KEPPRA) 500 MG tablet Take 1 tablet (500 mg total) by mouth 2 (two) times daily for 30 days. 60 tablet 3   No current facility-administered medications on file prior to visit.      Observations/Objective: Alert, awake, oriented x3 Not in acute distress  CMP Latest Ref Rng & Units 09/06/2018 08/09/2018 07/10/2018  Glucose 65 - 99 mg/dL 175(H) 171(H) 127(H)  BUN 6 - 24 mg/dL 15 17 19   Creatinine 0.76 - 1.27 mg/dL 1.80(H) 1.71(H) 1.70(H)  Sodium 134 - 144 mmol/L 135 138 136  Potassium 3.5 - 5.2 mmol/L 4.8 5.4(H) 5.7(H)  Chloride 96 - 106 mmol/L 103 102 101  CO2 20 - 29 mmol/L 19(L) 22 21  Calcium 8.7 - 10.2 mg/dL 8.3(L) 8.4(L) 8.9  Total Protein 6.0 - 8.5 g/dL - - 5.6(L)  Total Bilirubin 0.0 - 1.2 mg/dL - - 0.3  Alkaline Phos 39 - 117 IU/L - - 72  AST 0 - 40 IU/L - - 13  ALT 0 - 44 IU/L - - 12    Lipid Panel     Component Value Date/Time   CHOL 330 (H) 12/05/2017 1635   TRIG 107 12/05/2017 1635   HDL 66 12/05/2017 1635   CHOLHDL 5.0 12/05/2017 1635   LDLCALC 243 (H) 12/05/2017 1635    Lab Results  Component Value Date   HGBA1C 6.6 07/10/2018    Assessment and Plan: 1. Seizure ([place]) No recent seizures - levETIRAcetam (KEPPRA) 500 MG tablet; Take 1 tablet (500 mg total) by mouth 2 (two) times daily for 30 days.  Dispense: 60 tablet; Refill: 3  2. Anxiety and depression Stable - hydrOXYzine (ATARAX/VISTARIL) 25 MG tablet; Take 1 tablet (25 mg total) by mouth 3 (three) times daily as needed for anxiety.  Dispense: 90 tablet; Refill: 3 - citalopram (CELEXA) 20 MG tablet; Take 1 tablet (20 mg total) by mouth daily.  Dispense: 30 tablet; Refill: 3  3. Other emphysema (HCC) Controlled - Fluticasone-Salmeterol (ADVAIR DISKUS) 250-50 MCG/DOSE AEPB; Inhale 1 puff into the lungs 2 (two) times daily.  Dispense: 1 each; Refill: 3  4. Stage 3 chronic kidney disease (HCC) Last creatinine was 1.8 Previously referred to nephrology whom he could not see due to lack of insurance but he now has Medicaid so I will refer again - Ambulatory referral to Nephrology  5. Tobacco abuse Spent 3 minutes counseling on smoking cessation and he is not ready to quit at this  time  6. Type 2 diabetes mellitus with other specified complication, without long-term current use of insulin (HCC) Controlled with A1c of 6.6 Metformin was discontinued by cardiology but he should be able to take metformin with his GFR of 43 (guidelines recommend renal dosed with GFR of 30-45 and avoiding initiation with GFR of 30 and below) He will keep a blood sugar log and if sugars are trending up higher, will initiate glipizide Compliant with diabetic diet and lifestyle modifications - Blood Glucose Monitoring Suppl (ACCU-CHEK AVIVA) device; Use as instructed daily.  Dispense: 1 each; Refill: 0 - glucose blood (ACCU-CHEK AVIVA) test strip; Use 3 times daily  Dispense: 100 each; Refill: 12 - Lancets (ACCU-CHEK SOFT TOUCH) lancets;  Use as instructed  Dispense: 100 each; Refill: 12 - rosuvastatin (CRESTOR) 10 MG tablet; Take 1 tablet (10 mg total) by mouth daily.  Dispense: 30 tablet; Refill: 3  7. Essential hypertension Uncontrolled due to reports from home Increase hydralazine dose Counseled on blood pressure goal of less than 130/80, low-sodium, DASH diet, medication compliance, 150 minutes of moderate intensity exercise per week. Discussed medication compliance, adverse effects. - hydrALAZINE (APRESOLINE) 25 MG tablet; Take 1 tablet (25 mg total) by mouth 2 (two) times daily.  Dispense: 60 tablet; Refill: 3 - cloNIDine (CATAPRES) 0.1 MG tablet; Take 1 tablet (0.1 mg total) by mouth 2 (two) times daily.  Dispense: 60 tablet; Refill: 3   Follow Up Instructions: 3 months   I discussed the assessment and treatment plan with the patient. The patient was provided an opportunity to ask questions and all were answered. The patient agreed with the plan and demonstrated an understanding of the instructions.   The patient was advised to call back or seek an in-person evaluation if the symptoms worsen or if the condition fails to improve as anticipated.     I provided 26 minutes total of  non-face-to-face time during this encounter including median intraservice time, reviewing previous notes, labs, imaging, medications and explaining diagnosis and management.     Charlott Rakes, MD, FAAFP. [facility] [place], [place]   10/17/2018, 10:33 AM

## 2018-10-17 NOTE — Progress Notes (Signed)
Patient has been called and DOB has been verified. Patient has been screened and transferred to PCP to start phone visit.  C/C: Diabetes  Refills: all medications.

## 2018-11-01 ENCOUNTER — Other Ambulatory Visit: Payer: Self-pay

## 2018-11-01 ENCOUNTER — Telehealth (INDEPENDENT_AMBULATORY_CARE_PROVIDER_SITE_OTHER): Payer: Medicaid Other | Admitting: Cardiology

## 2018-11-01 ENCOUNTER — Encounter: Payer: Self-pay | Admitting: Cardiology

## 2018-11-01 ENCOUNTER — Telehealth: Payer: Self-pay | Admitting: Cardiology

## 2018-11-01 VITALS — BP 185/104 | HR 64 | Ht 73.0 in | Wt 228.0 lb

## 2018-11-01 DIAGNOSIS — I10 Essential (primary) hypertension: Secondary | ICD-10-CM

## 2018-11-01 DIAGNOSIS — N183 Chronic kidney disease, stage 3 unspecified: Secondary | ICD-10-CM

## 2018-11-01 DIAGNOSIS — I2510 Atherosclerotic heart disease of native coronary artery without angina pectoris: Secondary | ICD-10-CM

## 2018-11-01 DIAGNOSIS — E785 Hyperlipidemia, unspecified: Secondary | ICD-10-CM

## 2018-11-01 DIAGNOSIS — E875 Hyperkalemia: Secondary | ICD-10-CM

## 2018-11-01 MED ORDER — CLONIDINE HCL 0.2 MG PO TABS: 0.2000 mg | ORAL_TABLET | Freq: Two times a day (BID) | ORAL | 1 refills | Status: DC

## 2018-11-01 MED ORDER — SODIUM ZIRCONIUM CYCLOSILICATE 10 G PO PACK: 10.0000 g | PACK | ORAL | Status: DC

## 2018-11-01 MED ORDER — CLONIDINE HCL 0.2 MG PO TABS: 0.2000 mg | ORAL_TABLET | Freq: Every day | ORAL | 1 refills | Status: DC

## 2018-11-01 NOTE — Addendum Note (Signed)
Addended by: Particia Nearing B on: 11/01/2018 11:31 AM   Modules accepted: Orders

## 2018-11-01 NOTE — Telephone Encounter (Signed)
Virtual Visit Pre-Appointment Phone Call  "(Name), I am calling you today to discuss your upcoming appointment. We are currently trying to limit exposure to the virus that causes COVID-19 by seeing patients at home rather than in the office."  1. "What is the BEST phone number to call the day of the visit?" - include this in appointment notes  2. Do you have or have access to (through a family member/friend) a smartphone with video capability that we can use for your visit?" a. If yes - list this number in appt notes as cell (if different from BEST phone #) and list the appointment type as a VIDEO visit in appointment notes b. If no - list the appointment type as a PHONE visit in appointment notes  3. Confirm consent - "In the setting of the current Covid19 crisis, you are scheduled for a (phone or video) visit with your provider on (date) at (time).  Just as we do with many in-office visits, in order for you to participate in this visit, we must obtain consent.  If you'd like, I can send this to your mychart (if signed up) or email for you to review.  Otherwise, I can obtain your verbal consent now.  All virtual visits are billed to your insurance company just like a normal visit would be.  By agreeing to a virtual visit, we'd like you to understand that the technology does not allow for your provider to perform an examination, and thus may limit your provider's ability to fully assess your condition. If your provider identifies any concerns that need to be evaluated in person, we will make arrangements to do so.  Finally, though the technology is pretty good, we cannot assure that it will always work on either your or our end, and in the setting of a video visit, we may have to convert it to a phone-only visit.  In either situation, we cannot ensure that we have a secure connection.  Are you willing to proceed?" STAFF: Did the patient verbally acknowledge consent to telehealth visit? Document  YES/NO here: YES  4. Advise patient to be prepared - "Two hours prior to your appointment, go ahead and check your blood pressure, pulse, oxygen saturation, and your weight (if you have the equipment to check those) and write them all down. When your visit starts, your provider will ask you for this information. If you have an Apple Watch or Kardia device, please plan to have heart rate information ready on the day of your appointment. Please have a pen and paper handy nearby the day of the visit as well."  5. Give patient instructions for MyChart download to smartphone OR Doximity/[email or web address] as below if video visit (depending on what platform provider is using)  6. Inform patient they will receive a phone call 15 minutes prior to their appointment time (may be from unknown caller ID) so they should be prepared to answer    TELEPHONE CALL NOTE  [name] Slade Sr. has been deemed a candidate for a follow-up tele-health visit to limit community exposure during the Covid-19 pandemic. I spoke with the patient via phone to ensure availability of phone/video source, confirm preferred email & phone number, and discuss instructions and expectations.  I reminded [name] Mato Sr. to be prepared with any vital sign and/or heart rhythm information that could potentially be obtained via home monitoring, at the time of his visit. I reminded [name] Serviss Sr. to expect a phone call prior to  his visit.  Frederic Jericho 11/01/2018 10:17 AM   INSTRUCTIONS FOR DOWNLOADING THE MYCHART APP TO SMARTPHONE  - The patient must first make sure to have activated MyChart and know their login information - If Apple, go to [employer] and type in MyChart in the search bar and download the app. If Android, ask patient to go to [employer] and type in [place] in the search bar and download the app. The app is free but as with any other app downloads, their phone may require them to verify saved payment information or  Apple/Android password.  - The patient will need to then log into the app with their MyChart username and password, and select [facility] as their healthcare provider to link the account. When it is time for your visit, go to the MyChart app, find appointments, and click Begin Video Visit. Be sure to Select Allow for your device to access the Microphone and Camera for your visit. You will then be connected, and your provider will be with you shortly.  **If they have any issues connecting, or need assistance please contact MyChart service desk [phone])**  **If using a computer, in order to ensure the best quality for their visit they will need to use either of the following Internet Browsers: [employer], or Google Chrome**  IF USING DOXIMITY or DOXY.ME - The patient will receive a link just prior to their visit by text.     FULL LENGTH CONSENT FOR TELE-HEALTH VISIT   I hereby voluntarily request, consent and authorize [facility] and its employed or contracted physicians, physician assistants, nurse practitioners or other licensed health care professionals (the Practitioner), to provide me with telemedicine health care services (the Services") as deemed necessary by the treating Practitioner. I acknowledge and consent to receive the Services by the Practitioner via telemedicine. I understand that the telemedicine visit will involve communicating with the Practitioner through live audiovisual communication technology and the disclosure of certain medical information by electronic transmission. I acknowledge that I have been given the opportunity to request an in-person assessment or other available alternative prior to the telemedicine visit and am voluntarily participating in the telemedicine visit.  I understand that I have the right to withhold or withdraw my consent to the use of telemedicine in the course of my care at any time, without affecting my right to future care  or treatment, and that the Practitioner or I may terminate the telemedicine visit at any time. I understand that I have the right to inspect all information obtained and/or recorded in the course of the telemedicine visit and may receive copies of available information for a reasonable fee.  I understand that some of the potential risks of receiving the Services via telemedicine include:   Delay or interruption in medical evaluation due to technological equipment failure or disruption;  Information transmitted may not be sufficient (e.g. poor resolution of images) to allow for appropriate medical decision making by the Practitioner; and/or   In rare instances, security protocols could fail, causing a breach of personal health information.  Furthermore, I acknowledge that it is my responsibility to provide information about my medical history, conditions and care that is complete and accurate to the best of my ability. I acknowledge that Practitioner's advice, recommendations, and/or decision may be based on factors not within their control, such as incomplete or inaccurate data provided by me or distortions of diagnostic images or specimens that may result from electronic transmissions. I  understand that the practice of medicine is not an exact science and that Practitioner makes no warranties or guarantees regarding treatment outcomes. I acknowledge that I will receive a copy of this consent concurrently upon execution via email to the email address I last provided but may also request a printed copy by calling the office of [facility].    I understand that my insurance will be billed for this visit.   I have read or had this consent read to me.  I understand the contents of this consent, which adequately explains the benefits and risks of the Services being provided via telemedicine.   I have been provided ample opportunity to ask questions regarding this consent and the Services and have had  my questions answered to my satisfaction.  I give my informed consent for the services to be provided through the use of telemedicine in my medical care  By participating in this telemedicine visit I agree to the above.

## 2018-11-01 NOTE — Patient Instructions (Addendum)
Medication Instructions:  Your physician has recommended you make the following change in your medication:  RESTART: LOKELMA 10mg  daily on Mon Wed And FRi START: CLONIDINE 0.2mg  (1 tab) twice daily   If you need a refill on your cardiac medications before your next appointment, please call your pharmacy.   Lab work: Your physician recommends that you return for lab work in: [place]  If you have labs (blood work) drawn today and your tests are completely normal, you will receive your results only by: Marland Kitchen MyChart Message (if you have MyChart) OR . A paper copy in the mail If you have any lab test that is abnormal or we need to change your treatment, we will call you to review the results.  Testing/Procedures: None  Follow-Up: At [facility], you and your health needs are our priority.  As part of our continuing mission to provide you with exceptional heart care, we have created designated Provider Care Teams.  These Care Teams include your primary Cardiologist (physician) and Advanced Practice Providers (APPs -  Physician Assistants and Nurse Practitioners) who all work together to provide you with the care you need, when you need it. You will need a follow up appointment in 3 months.    Any Other Special Instructions Will Be Listed Below (If Applicable).  PLEASE CHECK BLOOD PRESSURE DAILY AT SAME TIME AND RECORD. PLEASE BRING THE LIST TO YOUR LAB APPOINTMENT FOR DR. MUNLEY TO REVIEW.

## 2018-11-01 NOTE — Addendum Note (Signed)
Addended by: Austin Miles on: 11/01/2018 11:38 AM   Modules accepted: Orders

## 2018-11-01 NOTE — Progress Notes (Signed)
Virtual Visit via Video Note   This visit type was conducted due to national recommendations for restrictions regarding [name] [name] Pandemic (e.g. social distancing) in an effort to limit this [name]'s exposure and mitigate transmission in our community.  Due to his co-morbid illnesses, this [name] is at least at moderate risk for complications without adequate follow up.  This format is felt to be most appropriate for this [name] at this time.  All issues noted in this document were discussed and addressed.  A limited physical exam was performed with this format.  Please refer to [name] [name]'s chart for his consent to telehealth for [facility].   Date:  11/01/2018   ID:  [name] Slade Sr., DOB 1960/12/22, MRN [id number]  [name] Location: Home Provider Location: Home  PCP:  Charlott Rakes, MD  Cardiologist:  No primary care provider on file. Dr Bettina Gavia Electrophysiologist:  None   Evaluation Performed:  Follow-Up Visit  Chief Complaint:  Hypertension, CKD and hyperkalemia  History of Present Illness:    [name] Dobek Sr.  is a 58 year old male with a medical history of of type 2 diabetes mellitus (A1c 6.6), Seizures, chronic kidney disease with hyperkalemia, alcohol abuse hypertension, alcohol abuse, coronary artery calcifications, ACDF of C4,5 and C5,6 on 09/14/17 last seen  10/17/18  [name] [name] does not have symptoms concerning for [name] infection (fever, chills, cough, or new shortness of breath).   Overall he is doing better he is off gabapentin and his anasarca has resolved.  Unfortunately his blood pressure is poorly controlled despite increasing hydralazine will do double [name] dose of his Catapres and I would drop off a list of his blood pressure medications in 1 to 2 weeks when he returns to [name] [place] office to have lab work performed.  He is doing better he has less pain more mobility no angina dyspnea or edema. Past Medical History:  Diagnosis Date  .  Anxiety   . CAD (coronary artery disease) 10/06/2017  . COPD (chronic obstructive pulmonary disease) ([place]) 09/29/2017  . Cough   . Depression   . Dyspnea   . 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 Hypertension 09/29/2017  . Myelopathy ([place]) 09/14/2017  . Tobacco abuse 09/29/2017  . Type 2 diabetes mellitus ([place]) 10/17/2011   Past Surgical History:  Procedure Laterality Date  . ANTERIOR CERVICAL DECOMP/DISCECTOMY FUSION N/A 09/14/2017   Procedure: ANTERIOR CERVICAL DECOMPRESSION/DISCECTOMY FUSIONCERVICAL 4- CERVICAL 5, CERVICAL 5- CERVICAL 6;  Surgeon: Kary Kos, MD;  Location: [place];  Service: Neurosurgery;  Laterality: N/A;  ANTERIOR CERVICAL DECOMPRESSION/DISCECTOMY FUSIONCERVICAL 4- CERVICAL 5, CERVICAL 5- CERVICAL 6     Current Meds  Medication Sig  . Blood Glucose Monitoring Suppl (ACCU-CHEK AVIVA) device Use as instructed daily.  . cetirizine (ZYRTEC) 10 MG tablet Take 10 mg by mouth daily.  . citalopram (CELEXA) 20 MG tablet Take 1 tablet (20 mg total) by mouth daily.  . cloNIDine (CATAPRES) 0.1 MG tablet Take 1 tablet (0.1 mg total) by mouth 2 (two) times daily.  . Fluticasone-Salmeterol (ADVAIR DISKUS) 250-50 MCG/DOSE AEPB Inhale 1 puff into [name] lungs 2 (two) times daily.  Marland Kitchen glucose blood (ACCU-CHEK AVIVA) test strip Use 3 times daily  . hydrALAZINE (APRESOLINE) 25 MG tablet Take 1 tablet (25 mg total) by mouth 2 (two) times daily.  . hydrOXYzine (ATARAX/VISTARIL) 25 MG tablet Take 1 tablet (25 mg total) by mouth 3 (three) times daily as needed for anxiety.  . Lancets (ACCU-CHEK SOFT TOUCH) lancets Use  as instructed  . levETIRAcetam (KEPPRA) 500 MG tablet Take 1 tablet (500 mg total) by mouth 2 (two) times daily for 30 days.  . rosuvastatin (CRESTOR) 10 MG tablet Take 1 tablet (10 mg total) by mouth daily.  Marland Kitchen thiamine 100 MG tablet Take 1 tablet (100 mg total) by mouth daily.  . VENTOLIN HFA 108 (90 Base) MCG/ACT inhaler INHALE 2 PUFFS INTO [name] LUNGS EVERY 6 HOURS AS NEEDED FOR WHEEZING OR  SHORTNESS OF BREATH.  . vitamin B-12 1000 MCG tablet Take 1 tablet (1,000 mcg total) by mouth daily.     Allergies:   [name] has no known allergies.   Social History   Tobacco Use  . Smoking status: Current Every Day Smoker    Packs/day: 2.00    Years: 42.00    Pack years: 84.00    Types: Cigarettes  . Smokeless tobacco: Never Used  Substance Use Topics  . Alcohol use: Yes  . Drug use: No     Family Hx: [name] [name]'s family history includes Diabetes in his mother; Hypertension in his brother; Kidney cancer in his father; Lung cancer in his father; Scoliosis in his mother.  ROS:   Please see [name] history of present illness.     All other systems reviewed and are negative.   Prior CV studies:   [name] following studies were reviewed today:    Labs/Other Tests and Data Reviewed:    EKG:  No ECG reviewed.  Recent Labs: 06/11/2018: NT-Pro BNP CANCELED 06/12/2018: B Natriuretic Peptide 24.4 06/14/2018: Hemoglobin 14.3; Platelets 299; TSH 4.523 07/10/2018: ALT 12 09/06/2018: BUN 15; Creatinine, Ser 1.80; Potassium 4.8; Sodium 135   Recent Lipid Panel Lab Results  Component Value Date/Time   CHOL 330 (H) 12/05/2017 04:35 PM   TRIG 107 12/05/2017 04:35 PM   HDL 66 12/05/2017 04:35 PM   CHOLHDL 5.0 12/05/2017 04:35 PM   LDLCALC 243 (H) 12/05/2017 04:35 PM    Wt Readings from Last 3 Encounters:  11/01/18 228 lb (103.4 kg)  07/20/18 247 lb 12.8 oz (112.4 kg)  07/10/18 237 lb (107.5 kg)     Objective:    Vital Signs:  BP (!) 185/104 (BP Location: Right Arm, [name] Position: Sitting)   Pulse 64   Ht 6\' 1"  (1.854 m)   Wt 228 lb (103.4 kg)   BMI 30.08 kg/m    Constitutional, well-nourished well-developed in no acute distress Vital signs reviewed Eyes, conjunctiva and sclera are normal without pallor or icterus extraocular motions intact and normal there is no lid lag Respiratory, normal effort and excursion no audible wheezing without a stethoscope  Cardiovascular, no neck vein distention or peripheral edema Skin, no rash skin lesion or ulceration of [name] extremities Neurologic, cranial nerves II to XII are grossly intact and [name] [name] moves all 4 extremities Neuro/Psychiatric, judgment and thought processes are intact and coherent, alert and oriented x3, mood and affect appear normal.  ASSESSMENT & PLAN:    1. Hypertension with chronic kidney disease poorly controlled he does not tolerate diuretics because of his CKD he is on hydralazine I will increase [name] dose of his clonidine and he is pending an evaluation with nephrology.  Unfortunately he stopped his potassium resin binder he has had symptomatic hyperkalemia in [name] past he will resume bring him to my office [facility] to do labs CMP lipid profile he also has coronary artery calcification without ischemia he takes a statin and aspirin and will continue these medications.  Of  also asked him to check his blood pressure daily and bring it when he comes for lab work in [name] office in 1 to 2 weeks.  I do not think he requires an ischemia evaluation at this time and I am going to intervene to treat his hypertension pending renal evaluation and treatment  [name] Education: [name] signs and symptoms of [name] were discussed with [name] [name] and how to seek care for testing (follow up with PCP or arrange E-visit).  [name] importance of social distancing was discussed today.  Time:   Today, I have spent 25 minutes with [name] [name] with telehealth technology discussing [name] above problems.     Medication Adjustments/Labs and Tests Ordered: Current medicines are reviewed at length with [name] [name] today.  Concerns regarding medicines are outlined above.   Tests Ordered: No orders of [name] defined types were placed in this encounter.   Medication Changes: No orders of [name] defined types were placed in this encounter.   Disposition:  Follow up in 3 month(s)  Signed, Shirlee More, MD   11/01/2018 11:03 AM    [place]

## 2018-11-20 ENCOUNTER — Other Ambulatory Visit: Payer: Self-pay | Admitting: Pharmacist

## 2018-11-20 ENCOUNTER — Other Ambulatory Visit: Payer: Self-pay | Admitting: Family Medicine

## 2018-11-20 DIAGNOSIS — J438 Other emphysema: Secondary | ICD-10-CM

## 2018-11-20 MED ORDER — ALBUTEROL SULFATE HFA 108 (90 BASE) MCG/ACT IN AERS: 2.0000 | INHALATION_SPRAY | Freq: Four times a day (QID) | RESPIRATORY_TRACT | 2 refills | Status: DC | PRN

## 2019-01-21 ENCOUNTER — Other Ambulatory Visit: Payer: Self-pay

## 2019-01-21 ENCOUNTER — Ambulatory Visit: Payer: Medicaid Other | Attending: Family Medicine | Admitting: Family Medicine

## 2019-01-21 ENCOUNTER — Encounter: Payer: Self-pay | Admitting: Family Medicine

## 2019-01-21 DIAGNOSIS — E1169 Type 2 diabetes mellitus with other specified complication: Secondary | ICD-10-CM

## 2019-01-21 DIAGNOSIS — I10 Essential (primary) hypertension: Secondary | ICD-10-CM

## 2019-01-21 DIAGNOSIS — F329 Major depressive disorder, single episode, unspecified: Secondary | ICD-10-CM

## 2019-01-21 DIAGNOSIS — F419 Anxiety disorder, unspecified: Secondary | ICD-10-CM | POA: Diagnosis not present

## 2019-01-21 DIAGNOSIS — R569 Unspecified convulsions: Secondary | ICD-10-CM

## 2019-01-21 DIAGNOSIS — G629 Polyneuropathy, unspecified: Secondary | ICD-10-CM

## 2019-01-21 DIAGNOSIS — J438 Other emphysema: Secondary | ICD-10-CM

## 2019-01-21 DIAGNOSIS — F32A Depression, unspecified: Secondary | ICD-10-CM

## 2019-01-21 MED ORDER — LOKELMA 10 G PO PACK: 10.0000 g | PACK | ORAL | 0 refills | Status: DC

## 2019-01-21 MED ORDER — HYDROXYZINE HCL 25 MG PO TABS: 25.0000 mg | ORAL_TABLET | Freq: Three times a day (TID) | ORAL | 3 refills | Status: DC | PRN

## 2019-01-21 MED ORDER — LEVETIRACETAM 500 MG PO TABS: 500.0000 mg | ORAL_TABLET | Freq: Two times a day (BID) | ORAL | 3 refills | Status: DC

## 2019-01-21 MED ORDER — CITALOPRAM HYDROBROMIDE 20 MG PO TABS: 20.0000 mg | ORAL_TABLET | Freq: Every day | ORAL | 3 refills | Status: DC

## 2019-01-21 MED ORDER — FLUTICASONE-SALMETEROL 250-50 MCG/DOSE IN AEPB: 1.0000 | INHALATION_SPRAY | Freq: Two times a day (BID) | RESPIRATORY_TRACT | 3 refills | Status: DC

## 2019-01-21 MED ORDER — HYDRALAZINE HCL 25 MG PO TABS: 25.0000 mg | ORAL_TABLET | Freq: Two times a day (BID) | ORAL | 3 refills | Status: DC

## 2019-01-21 MED ORDER — ROSUVASTATIN CALCIUM 10 MG PO TABS: 10.0000 mg | ORAL_TABLET | Freq: Every day | ORAL | 3 refills | Status: DC

## 2019-01-21 MED ORDER — ALBUTEROL SULFATE HFA 108 (90 BASE) MCG/ACT IN AERS: 2.0000 | INHALATION_SPRAY | Freq: Four times a day (QID) | RESPIRATORY_TRACT | 2 refills | Status: DC | PRN

## 2019-01-21 NOTE — Progress Notes (Signed)
Needs RF-  Citalopram Hydralazine  Clonidine keppra Lokelma? Unable to get medication Thiamine vitaminB1

## 2019-01-21 NOTE — Progress Notes (Signed)
Virtual Visit via Telephone Note  I connected with [name] Hoogendoorn Sr., on 01/21/2019 at 3:47 PM by telephone due to the COVID-19 pandemic and verified that I am speaking with the correct person using two identifiers.   Consent: I discussed the limitations, risks, security and privacy concerns of performing an evaluation and management service by telephone and the availability of in person appointments. I also discussed with the patient that there may be a patient responsible charge related to this service. The patient expressed understanding and agreed to proceed.   Location of Patient: Home  Location of Provider: Clinic  Persons participating in Telemedicine visit: Rhen Kawecki Sr. Ozzie Hoyle Dr. Felecia [place]     History of Present Illness: [name] Kreuser Sr.  is a 58 year old male with a medical history of of type 2 diabetes mellitus (A1c 6.6), Seizures, chronic kidney disease, alcohol abuse hypertension, alcohol abuse, coronary artery calcifications, ACDF of C4,5 and C5,6 on 09/14/17  By Dr Saintclair Halsted secondary to Myelopathy seen for a follow-up visit.   He has been compliant with antihypertensives and blood pressures at home are 168/100 prior to taking his medications in the morning and 126/83 in the evening. He denies hypoglycemia but does have neuropathy but is unable to take Gabapentin due to pedal edema asociated with it. He states he is willing to bear the pain from Neuropathy as he no longer needs a cane to ambulate which he previously did due to pedal edema. Last saw Cardiology in 10/2018 and does not have a follow up appointment yet. I had referred him to [facility] and he is yet to hear from them. I have provided him with their number. He denies recent seizures. Anxiety and Depression are controlled on Celexa.  Past Medical History:  Diagnosis Date  . Anxiety   . CAD (coronary artery disease) 10/06/2017  . COPD (chronic obstructive pulmonary disease)  ([place]) 09/29/2017  . Cough   . Depression   . Dyspnea   . 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 Hypertension 09/29/2017  . Myelopathy ([place]) 09/14/2017  . Tobacco abuse 09/29/2017  . Type 2 diabetes mellitus ([place]) 10/17/2011   No Known Allergies  Current Outpatient Medications on File Prior to Visit  Medication Sig Dispense Refill  . cetirizine (ZYRTEC) 10 MG tablet Take 10 mg by mouth daily.    . cloNIDine (CATAPRES) 0.2 MG tablet Take 1 tablet (0.2 mg total) by mouth 2 (two) times daily. 180 tablet 1  . glucose blood (ACCU-CHEK AVIVA) test strip Use 3 times daily 100 each 12  . Lancets (ACCU-CHEK SOFT TOUCH) lancets Use as instructed 100 each 12  . thiamine 100 MG tablet Take 1 tablet (100 mg total) by mouth daily. 30 tablet 0  . vitamin B-12 1000 MCG tablet Take 1 tablet (1,000 mcg total) by mouth daily. 30 tablet 0  . Blood Glucose Monitoring Suppl (ACCU-CHEK AVIVA) device Use as instructed daily. 1 each 0   No current facility-administered medications on file prior to visit.     Observations/Objective: Awake, alert, oriented x3 Not in acute distress  Assessment and Plan: 1. Other emphysema (HCC) Stable - Fluticasone-Salmeterol (ADVAIR DISKUS) 250-50 MCG/DOSE AEPB; Inhale 1 puff into the lungs 2 (two) times daily.  Dispense: 1 each; Refill: 3  2. Type 2 diabetes mellitus with other specified complication, without long-term current use of insulin (HCC) Controlled with A1c of 6.6 He is due for an A1c - rosuvastatin (CRESTOR) 10 MG tablet; Take 1 tablet (10 mg total)  by mouth daily.  Dispense: 30 tablet; Refill: 3  3. Anxiety and depression Stable - citalopram (CELEXA) 20 MG tablet; Take 1 tablet (20 mg total) by mouth daily.  Dispense: 30 tablet; Refill: 3 - hydrOXYzine (ATARAX/VISTARIL) 25 MG tablet; Take 1 tablet (25 mg total) by mouth 3 (three) times daily as needed for anxiety.  Dispense: 90 tablet; Refill: 3  4. Essential hypertension Controlled when he takes his  medications Counseled on blood pressure goal of less than 130/80, low-sodium, DASH diet, medication compliance, 150 minutes of moderate intensity exercise per week. Discussed medication compliance, adverse effects. - hydrALAZINE (APRESOLINE) 25 MG tablet; Take 1 tablet (25 mg total) by mouth 2 (two) times daily.  Dispense: 60 tablet; Refill: 3  5. Seizure ([place]) Stable - levETIRAcetam (KEPPRA) 500 MG tablet; Take 1 tablet (500 mg total) by mouth 2 (two) times daily.  Dispense: 60 tablet; Refill: 3  6. Neuropathy Uncontrolled Unable to tolerate Gabapentin due to pedal edema Would love to place on Cymbalta but this will entail discontinuing Celexa which he declines.   Follow Up Instructions: 3 months   I discussed the assessment and treatment plan with the patient. The patient was provided an opportunity to ask questions and all were answered. The patient agreed with the plan and demonstrated an understanding of the instructions.   The patient was advised to call back or seek an in-person evaluation if the symptoms worsen or if the condition fails to improve as anticipated.     I provided 22 minutes total of non-face-to-face time during this encounter including median intraservice time, reviewing previous notes, labs, imaging, medications, management and patient verbalized understanding.     Charlott Rakes, MD, FAAFP. [facility] [place], [place]   01/21/2019, 3:47 PM

## 2019-02-12 ENCOUNTER — Ambulatory Visit: Payer: Medicaid Other | Admitting: Cardiology

## 2019-02-25 NOTE — Progress Notes (Signed)
Cardiology Office Note:    Date:  02/26/2019   ID:  [name] Slade Sr., DOB [birth year], MRN [id number]  PCP:  Charlott Rakes, MD  Cardiologist:  Shirlee More, MD    Referring MD: Charlott Rakes, MD    ASSESSMENT:    1. Essential hypertension   2. Stage 3 chronic kidney disease ([place])   3. Hyperkalemia    PLAN:    In order of problems listed above:  1. Improved continue current treatment now being managed by nephrology including his thiazide diuretic clonidine hydralazine 2. Stable managed by nephrology 3. Restart lokelma    Next appointment: 6 months   Medication Adjustments/Labs and Tests Ordered: Current medicines are reviewed at length with the patient today.  Concerns regarding medicines are outlined above.  Orders Placed This Encounter  Procedures  . EKG 12-Lead   Meds ordered this encounter  Medications  . sodium zirconium cyclosilicate (LOKELMA) 10 g PACK packet    Sig: Take 10 g by mouth as directed. Take on Monday, Wednesday, and Friday    Dispense:  36 each    Refill:  1  . hydrALAZINE (APRESOLINE) 25 MG tablet    Sig: Take 1 tablet (25 mg total) by mouth 2 (two) times daily.    Dispense:  180 tablet    Refill:  1  . cloNIDine (CATAPRES) 0.2 MG tablet    Sig: Take 1 tablet (0.2 mg total) by mouth 2 (two) times daily.    Dispense:  180 tablet    Refill:  1    Chief Complaint  Patient presents with  . OTHER    edema  . Chronic Kidney Disease    with hyperkalemia  . Hypertension    History of Present Illness:    [name] Turvey Sr. is a 58 y.o. male with a hx of edema with anasarca, type 2 diabetes mellitus (A1c 6.6), Seizures, chronic kidney disease with hyperkalemia, alcohol abuse hypertension, alcohol abuse, coronary artery calcifications, ACDF of C4,5 and C5,6 on 09/14/17   last seen 11/01/2018. Compliance with diet, lifestyle and medications: Yes  He continues have severe chronic back and lower extremity pain but is markedly improved off  gabapentin and his anasarca has resolved.  No chest pain shortness of breath or syncope.  Seen this week [facility] in [place] labs drawn not resulted to patient unavailable to me.  He has been off of his lokelma and I will send a prescription to pharmacy and home blood pressures after starting thiazide diuretic are running 130-150/70.  Nephrology is now managing hypertension. Past Medical History:  Diagnosis Date  . Anxiety   . CAD (coronary artery disease) 10/06/2017  . COPD (chronic obstructive pulmonary disease) ([place]) 09/29/2017  . Cough   . Depression   . Dyspnea   . 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 Hypertension 09/29/2017  . Myelopathy ([place]) 09/14/2017  . Tobacco abuse 09/29/2017  . Type 2 diabetes mellitus ([place]) 10/17/2011    Past Surgical History:  Procedure Laterality Date  . ANTERIOR CERVICAL DECOMP/DISCECTOMY FUSION N/A 09/14/2017   Procedure: ANTERIOR CERVICAL DECOMPRESSION/DISCECTOMY FUSIONCERVICAL 4- CERVICAL 5, CERVICAL 5- CERVICAL 6;  Surgeon: Kary Kos, MD;  Location: [place];  Service: Neurosurgery;  Laterality: N/A;  ANTERIOR CERVICAL DECOMPRESSION/DISCECTOMY FUSIONCERVICAL 4- CERVICAL 5, CERVICAL 5- CERVICAL 6    Current Medications: Current Meds  Medication Sig  . albuterol (PROAIR HFA) 108 (90 Base) MCG/ACT inhaler Inhale 2 puffs into the lungs every 6 (six) hours as needed for wheezing or shortness of breath.  Marland Kitchen  Blood Glucose Monitoring Suppl (ACCU-CHEK AVIVA) device Use as instructed daily.  . cetirizine (ZYRTEC) 10 MG tablet Take 10 mg by mouth daily.  . citalopram (CELEXA) 20 MG tablet Take 1 tablet (20 mg total) by mouth daily.  . cloNIDine (CATAPRES) 0.2 MG tablet Take 1 tablet (0.2 mg total) by mouth 2 (two) times daily.  . Fluticasone-Salmeterol (ADVAIR DISKUS) 250-50 MCG/DOSE AEPB Inhale 1 puff into the lungs 2 (two) times daily.  Marland Kitchen glucose blood (ACCU-CHEK AVIVA) test strip Use 3 times daily  . hydrALAZINE (APRESOLINE) 25 MG tablet Take 1 tablet (25 mg total) by mouth  2 (two) times daily.  . hydrochlorothiazide (HYDRODIURIL) 25 MG tablet Take 25 mg by mouth daily.  . hydrOXYzine (ATARAX/VISTARIL) 25 MG tablet Take 1 tablet (25 mg total) by mouth 3 (three) times daily as needed for anxiety.  . Lancets (ACCU-CHEK SOFT TOUCH) lancets Use as instructed  . rosuvastatin (CRESTOR) 10 MG tablet Take 1 tablet (10 mg total) by mouth daily.  . sodium zirconium cyclosilicate (LOKELMA) 10 g PACK packet Take 10 g by mouth as directed. Take on Monday, Wednesday, and Friday  . thiamine 100 MG tablet Take 1 tablet (100 mg total) by mouth daily.  . vitamin B-12 1000 MCG tablet Take 1 tablet (1,000 mcg total) by mouth daily.  . [DISCONTINUED] cloNIDine (CATAPRES) 0.2 MG tablet Take 1 tablet (0.2 mg total) by mouth 2 (two) times daily.  . [DISCONTINUED] hydrALAZINE (APRESOLINE) 25 MG tablet Take 1 tablet (25 mg total) by mouth 2 (two) times daily.  . [DISCONTINUED] sodium zirconium cyclosilicate (LOKELMA) 10 g PACK packet Take 10 g by mouth as directed. Take on Monday, Wednesday, and Friday     Allergies:   Patient has no known allergies.   Social History   Socioeconomic History  . Marital status: Single    Spouse name: Not on file  . Number of children: Not on file  . Years of education: Not on file  . Highest education level: Not on file  Occupational History  . Not on file  Social Needs  . Financial resource strain: Not on file  . Food insecurity    Worry: Not on file    Inability: Not on file  . Transportation needs    Medical: Not on file    Non-medical: Not on file  Tobacco Use  . Smoking status: Current Every Day Smoker    Packs/day: 1.50    Years: 42.00    Pack years: 63.00    Types: Cigarettes  . Smokeless tobacco: Never Used  Substance and Sexual Activity  . Alcohol use: Yes    Comment: 28/week  . Drug use: No  . Sexual activity: Not on file  Lifestyle  . Physical activity    Days per week: Not on file    Minutes per session: Not on file  .  Stress: Not on file  Relationships  . Social Herbalist on phone: Not on file    Gets together: Not on file    Attends religious service: Not on file    Active member of club or organization: Not on file    Attends meetings of clubs or organizations: Not on file    Relationship status: Not on file  Other Topics Concern  . Not on file  Social History Narrative  . Not on file     Family History: The patient's family history includes Diabetes in his mother; Hypertension in his brother; Kidney cancer  in his father; Lung cancer in his father; Scoliosis in his mother. ROS:   Please see the history of present illness.    All other systems reviewed and are negative.  EKGs/Labs/Other Studies Reviewed:    The following studies were reviewed today:  EKG:  EKG ordered today and personally reviewed.  The ekg ordered today demonstrates sinus rhythm no ischemic changes  Recent Labs: 06/11/2018: NT-Pro BNP CANCELED 06/12/2018: B Natriuretic Peptide 24.4 06/14/2018: Hemoglobin 14.3; Platelets 299; TSH 4.523 07/10/2018: ALT 12 09/06/2018: BUN 15; Creatinine, Ser 1.80; Potassium 4.8; Sodium 135  Recent Lipid Panel    Component Value Date/Time   CHOL 330 (H) 12/05/2017 1635   TRIG 107 12/05/2017 1635   HDL 66 12/05/2017 1635   CHOLHDL 5.0 12/05/2017 1635   LDLCALC 243 (H) 12/05/2017 1635    Physical Exam:    VS:  BP (!) 140/94 (BP Location: Right Arm, Patient Position: Sitting, Cuff Size: Normal)   Pulse 63   Ht 6\' 2"  (1.88 m)   Wt 217 lb 8 oz (98.7 kg)   SpO2 98%   BMI 27.93 kg/m     Wt Readings from Last 3 Encounters:  02/26/19 217 lb 8 oz (98.7 kg)  11/01/18 228 lb (103.4 kg)  07/20/18 247 lb 12.8 oz (112.4 kg)     GEN:  Well nourished, well developed in no acute distress HEENT: Normal NECK: No JVD; No carotid bruits LYMPHATICS: No lymphadenopathy CARDIAC: RRR, no murmurs, rubs, gallops RESPIRATORY:  Clear to auscultation without rales, wheezing or rhonchi   ABDOMEN: Soft, non-tender, non-distended MUSCULOSKELETAL:  No edema; No deformity  SKIN: Warm and dry NEUROLOGIC:  Alert and oriented x 3 PSYCHIATRIC:  Normal affect    Signed, Shirlee More, MD  02/26/2019 6:30 PM    [place]

## 2019-02-26 ENCOUNTER — Ambulatory Visit (INDEPENDENT_AMBULATORY_CARE_PROVIDER_SITE_OTHER): Payer: Medicaid Other | Admitting: Cardiology

## 2019-02-26 ENCOUNTER — Other Ambulatory Visit: Payer: Self-pay

## 2019-02-26 ENCOUNTER — Encounter: Payer: Self-pay | Admitting: Cardiology

## 2019-02-26 VITALS — BP 140/94 | HR 63 | Ht 74.0 in | Wt 217.5 lb

## 2019-02-26 DIAGNOSIS — N183 Chronic kidney disease, stage 3 unspecified: Secondary | ICD-10-CM

## 2019-02-26 DIAGNOSIS — E875 Hyperkalemia: Secondary | ICD-10-CM | POA: Diagnosis not present

## 2019-02-26 DIAGNOSIS — I10 Essential (primary) hypertension: Secondary | ICD-10-CM | POA: Diagnosis not present

## 2019-02-26 MED ORDER — CLONIDINE HCL 0.2 MG PO TABS: 0.2000 mg | ORAL_TABLET | Freq: Two times a day (BID) | ORAL | 1 refills | Status: DC

## 2019-02-26 MED ORDER — HYDRALAZINE HCL 25 MG PO TABS: 25.0000 mg | ORAL_TABLET | Freq: Two times a day (BID) | ORAL | 1 refills | Status: DC

## 2019-02-26 MED ORDER — LOKELMA 10 G PO PACK: 10.0000 g | PACK | ORAL | 1 refills | Status: DC

## 2019-02-26 NOTE — Patient Instructions (Signed)

## 2019-03-06 ENCOUNTER — Other Ambulatory Visit (HOSPITAL_COMMUNITY): Payer: Self-pay | Admitting: Nephrology

## 2019-03-06 DIAGNOSIS — N183 Chronic kidney disease, stage 3 unspecified: Secondary | ICD-10-CM

## 2019-03-08 ENCOUNTER — Other Ambulatory Visit: Payer: Self-pay | Admitting: Student

## 2019-03-11 ENCOUNTER — Encounter (HOSPITAL_COMMUNITY): Payer: Self-pay

## 2019-03-11 ENCOUNTER — Other Ambulatory Visit: Payer: Self-pay

## 2019-03-11 ENCOUNTER — Ambulatory Visit (HOSPITAL_COMMUNITY)
Admission: RE | Admit: 2019-03-11 | Discharge: 2019-03-11 | Disposition: A | Discharge status: Home / Self Care | Payer: Medicaid Other | Source: Ambulatory Visit | Attending: Nephrology | Admitting: Nephrology

## 2019-03-11 DIAGNOSIS — Z79899 Other long term (current) drug therapy: Secondary | ICD-10-CM | POA: Insufficient documentation

## 2019-03-11 DIAGNOSIS — G959 Disease of spinal cord, unspecified: Secondary | ICD-10-CM | POA: Insufficient documentation

## 2019-03-11 DIAGNOSIS — E1122 Type 2 diabetes mellitus with diabetic chronic kidney disease: Secondary | ICD-10-CM | POA: Insufficient documentation

## 2019-03-11 DIAGNOSIS — I2510 Atherosclerotic heart disease of native coronary artery without angina pectoris: Secondary | ICD-10-CM | POA: Diagnosis not present

## 2019-03-11 DIAGNOSIS — J449 Chronic obstructive pulmonary disease, unspecified: Secondary | ICD-10-CM | POA: Diagnosis not present

## 2019-03-11 DIAGNOSIS — F17210 Nicotine dependence, cigarettes, uncomplicated: Secondary | ICD-10-CM | POA: Insufficient documentation

## 2019-03-11 DIAGNOSIS — I129 Hypertensive chronic kidney disease with stage 1 through stage 4 chronic kidney disease, or unspecified chronic kidney disease: Secondary | ICD-10-CM | POA: Diagnosis not present

## 2019-03-11 DIAGNOSIS — N183 Chronic kidney disease, stage 3 unspecified: Secondary | ICD-10-CM

## 2019-03-11 DIAGNOSIS — F419 Anxiety disorder, unspecified: Secondary | ICD-10-CM | POA: Insufficient documentation

## 2019-03-11 LAB — PROTIME-INR
INR: 0.9 (ref 0.8–1.2)
Prothrombin Time: 12.5 seconds (ref 11.4–15.2)

## 2019-03-11 LAB — CBC
HCT: 46.9 % (ref 39.0–52.0)
Hemoglobin: 15.8 g/dL (ref 13.0–17.0)
MCH: 32.2 pg (ref 26.0–34.0)
MCHC: 33.7 g/dL (ref 30.0–36.0)
MCV: 95.7 fL (ref 80.0–100.0)
Platelets: 278 10*3/uL (ref 150–400)
RBC: 4.9 MIL/uL (ref 4.22–5.81)
RDW: 12.2 % (ref 11.5–15.5)
WBC: 10.4 10*3/uL (ref 4.0–10.5)
nRBC: 0 % (ref 0.0–0.2)

## 2019-03-11 LAB — GLUCOSE, CAPILLARY
Glucose-Capillary: 170 mg/dL — ABNORMAL HIGH (ref 70–99)
Glucose-Capillary: 155 mg/dL — ABNORMAL HIGH (ref 70–99)

## 2019-03-11 LAB — BASIC METABOLIC PANEL
Anion gap: 8 (ref 5–15)
BUN: 25 mg/dL — ABNORMAL HIGH (ref 6–20)
CO2: 23 mmol/L (ref 22–32)
Calcium: 7.9 mg/dL — ABNORMAL LOW (ref 8.9–10.3)
Chloride: 97 mmol/L — ABNORMAL LOW (ref 98–111)
Creatinine, Ser: 1.96 mg/dL — ABNORMAL HIGH (ref 0.61–1.24)
GFR calc Af Amer: 42 mL/min — ABNORMAL LOW (ref >60)
GFR calc non Af Amer: 36 mL/min — ABNORMAL LOW (ref >60)
Glucose, Bld: 158 mg/dL — ABNORMAL HIGH (ref 70–99)
Potassium: 4 mmol/L (ref 3.5–5.1)
Sodium: 128 mmol/L — ABNORMAL LOW (ref 135–145)

## 2019-03-11 MED ORDER — HYDROCODONE-ACETAMINOPHEN 5-325 MG PO TABS: 1.0000 | ORAL_TABLET | ORAL | Status: DC | PRN

## 2019-03-11 MED ORDER — SODIUM CHLORIDE 0.9 % IV SOLN: INTRAVENOUS | Status: DC

## 2019-03-11 MED ORDER — LIDOCAINE HCL (PF) 1 % IJ SOLN
INTRAMUSCULAR | Status: AC
  Filled 2019-03-11: qty 30

## 2019-03-11 MED ORDER — FENTANYL CITRATE (PF) 100 MCG/2ML IJ SOLN
INTRAMUSCULAR | Status: AC | PRN
  Administered 2019-03-11 (×2): 50 ug via INTRAVENOUS

## 2019-03-11 MED ORDER — MIDAZOLAM HCL 2 MG/2ML IJ SOLN
INTRAMUSCULAR | Status: AC | PRN
  Administered 2019-03-11 (×2): 1 mg via INTRAVENOUS

## 2019-03-11 MED ORDER — SODIUM CHLORIDE 0.9 % IV SOLN
INTRAVENOUS | Status: AC | PRN
  Administered 2019-03-11: 10 mL/h via INTRAVENOUS

## 2019-03-11 MED ORDER — MIDAZOLAM HCL 2 MG/2ML IJ SOLN
INTRAMUSCULAR | Status: AC
  Filled 2019-03-11: qty 2

## 2019-03-11 MED ORDER — FENTANYL CITRATE (PF) 100 MCG/2ML IJ SOLN
INTRAMUSCULAR | Status: AC
  Filled 2019-03-11: qty 2

## 2019-03-11 NOTE — H&P (Signed)
Chief Complaint: Patient was seen in consultation today for a random renal biopsy.   Referring Physician(s): Patel,Jay  Supervising Physician: Arne Cleveland  Patient Status: [facility] - Out-pt  History of Present Illness: [name] Montellano Sr. is a 58 y.o. male with a past medical history significant for anxiety, depression, DM II, COPD, tobacco use (2 PPD currently), HTN, CAD and CKD who presents today for a random renal biopsy. [name] reports that he had a back surgery last year and states that "ever since then it's one thing after the next." He states that his blood pressure has become very difficult to control and his kidney function has continued to worsen over the last year, his blood pressure at times can be as high as 190/130 mmHg without medication and is usually around 140/90 mmHg with three blood pressure medications. He states he was referred to Dr. Posey Pronto for help with his blood pressure and he has requested that he undergo a kidney biopsy for which he presents today.   Past Medical History:  Diagnosis Date  . Anxiety   . CAD (coronary artery disease) 10/06/2017  . COPD (chronic obstructive pulmonary disease) ([place]) 09/29/2017  . Cough   . Depression   . Dyspnea   . 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 Hypertension 09/29/2017  . Myelopathy ([place]) 09/14/2017  . Tobacco abuse 09/29/2017  . Type 2 diabetes mellitus ([place]) 10/17/2011    Past Surgical History:  Procedure Laterality Date  . ANTERIOR CERVICAL DECOMP/DISCECTOMY FUSION N/A 09/14/2017   Procedure: ANTERIOR CERVICAL DECOMPRESSION/DISCECTOMY FUSIONCERVICAL 4- CERVICAL 5, CERVICAL 5- CERVICAL 6;  Surgeon: Kary Kos, MD;  Location: [facility];  Service: Neurosurgery;  Laterality: N/A;  ANTERIOR CERVICAL DECOMPRESSION/DISCECTOMY FUSIONCERVICAL 4- CERVICAL 5, CERVICAL 5- CERVICAL 6    Allergies: Patient has no known allergies.  Medications: Prior to Admission medications   Medication Sig Start Date End Date Taking? Authorizing Provider   albuterol (PROAIR HFA) 108 (90 Base) MCG/ACT inhaler Inhale 2 puffs into the lungs every 6 (six) hours as needed for wheezing or shortness of breath. 01/21/19  Yes Newlin, Enobong, MD  cetirizine (ZYRTEC) 10 MG tablet Take 10 mg by mouth daily as needed for allergies.    Yes [provider]  citalopram (CELEXA) 20 MG tablet Take 1 tablet (20 mg total) by mouth daily. 01/21/19  Yes Charlott Rakes, MD  cloNIDine (CATAPRES) 0.2 MG tablet Take 1 tablet (0.2 mg total) by mouth 2 (two) times daily. 02/26/19  Yes Richardo Priest, MD  Fluticasone-Salmeterol (ADVAIR DISKUS) 250-50 MCG/DOSE AEPB Inhale 1 puff into the lungs 2 (two) times daily. 01/21/19  Yes Charlott Rakes, MD  hydrALAZINE (APRESOLINE) 25 MG tablet Take 1 tablet (25 mg total) by mouth 2 (two) times daily. 02/26/19 05/27/19 Yes Richardo Priest, MD  hydrochlorothiazide (HYDRODIURIL) 25 MG tablet Take 25 mg by mouth daily.   Yes [provider]  hydrOXYzine (ATARAX/VISTARIL) 25 MG tablet Take 1 tablet (25 mg total) by mouth 3 (three) times daily as needed for anxiety. 01/21/19  Yes Charlott Rakes, MD  rosuvastatin (CRESTOR) 10 MG tablet Take 1 tablet (10 mg total) by mouth daily. 01/21/19  Yes Charlott Rakes, MD  Blood Glucose Monitoring Suppl (ACCU-CHEK AVIVA) device Use as instructed daily. 10/17/18   Charlott Rakes, MD  glucose blood (ACCU-CHEK AVIVA) test strip Use 3 times daily 10/17/18   Charlott Rakes, MD  Lancets (ACCU-CHEK SOFT TOUCH) lancets Use as instructed 10/17/18   Charlott Rakes, MD  levETIRAcetam (KEPPRA) 500 MG tablet Take  1 tablet (500 mg total) by mouth 2 (two) times daily. 01/21/19 03/08/19  Charlott Rakes, MD  sodium zirconium cyclosilicate (LOKELMA) 10 g PACK packet Take 10 g by mouth as directed. Take on Monday, Wednesday, and Friday 02/26/19   Richardo Priest, MD  thiamine 100 MG tablet Take 1 tablet (100 mg total) by mouth daily. 06/15/18   Geradine Girt, DO  vitamin B-12 1000 MCG tablet Take 1 tablet (1,000  mcg total) by mouth daily. 06/15/18   Geradine Girt, DO     Family History  Problem Relation Age of Onset  . Diabetes Mother   . Scoliosis Mother   . Kidney cancer Father   . Lung cancer Father   . Hypertension Brother     Social History   Socioeconomic History  . Marital status: Single    Spouse name: Not on file  . Number of children: Not on file  . Years of education: Not on file  . Highest education level: Not on file  Occupational History  . Not on file  Social Needs  . Financial resource strain: Not on file  . Food insecurity    Worry: Not on file    Inability: Not on file  . Transportation needs    Medical: Not on file    Non-medical: Not on file  Tobacco Use  . Smoking status: Current Every Day Smoker    Packs/day: 1.50    Years: 42.00    Pack years: 63.00    Types: Cigarettes  . Smokeless tobacco: Never Used  Substance and Sexual Activity  . Alcohol use: Yes    Comment: 28/week  . Drug use: Yes    Types: Marijuana  . Sexual activity: Not on file  Lifestyle  . Physical activity    Days per week: Not on file    Minutes per session: Not on file  . Stress: Not on file  Relationships  . Social Herbalist on phone: Not on file    Gets together: Not on file    Attends religious service: Not on file    Active member of club or organization: Not on file    Attends meetings of clubs or organizations: Not on file    Relationship status: Not on file  Other Topics Concern  . Not on file  Social History Narrative  . Not on file     Review of Systems: A 12 point ROS discussed and pertinent positives are indicated in the HPI above.  All other systems are negative.  Review of Systems  Constitutional: Negative for chills and fever.  HENT: Negative for nosebleeds.   Respiratory: Positive for cough (intermittent; chronic due to smoking). Negative for shortness of breath.   Cardiovascular: Negative for chest pain.  Gastrointestinal: Negative for  abdominal pain, blood in stool, diarrhea, nausea and vomiting.  Genitourinary: Negative for hematuria.  Skin: Negative for rash.  Neurological: Negative for dizziness, syncope and headaches.    Vital Signs: BP 128/78   Pulse 64   Temp (!) 97.5 F (36.4 C) (Oral)   Resp 16   Ht 6\' 1"  (1.854 m)   Wt 220 lb (99.8 kg)   SpO2 97%   BMI 29.03 kg/m   Physical Exam Vitals signs reviewed.  HENT:     Head: Normocephalic.     Mouth/Throat:     Mouth: Mucous membranes are moist.     Pharynx: Oropharynx is clear. No oropharyngeal exudate or posterior oropharyngeal  erythema.  Cardiovascular:     Rate and Rhythm: Normal rate and regular rhythm.  Pulmonary:     Effort: Pulmonary effort is normal.     Breath sounds: Normal breath sounds.  Abdominal:     General: Bowel sounds are normal. There is no distension.     Palpations: Abdomen is soft.     Tenderness: There is no abdominal tenderness.  Skin:    General: Skin is warm and dry.  Neurological:     Mental Status: He is alert and oriented to person, place, and time.  Psychiatric:        Mood and Affect: Mood normal.        Behavior: Behavior normal.        Thought Content: Thought content normal.        Judgment: Judgment normal.      MD Evaluation Airway: WNL Heart: WNL Abdomen: WNL Chest/ Lungs: WNL ASA  Classification: 2 Mallampati/Airway Score: One   Imaging: No results found.  Labs:  CBC: Recent Labs    06/12/18 1509 06/14/18 0547  WBC 11.6* 11.2*  HGB 15.9 14.3  HCT 48.9 41.8  PLT 321 299    COAGS: No results for input(s): INR, APTT in the last 8760 hours.  BMP: Recent Labs    06/14/18 0547 07/10/18 1513 08/09/18 1550 09/06/18 1543  NA 138 136 138 135  K 4.3 5.7* 5.4* 4.8  CL 109 101 102 103  CO2 18* 21 22 19*  GLUCOSE 146* 127* 171* 175*  BUN 26* 19 17 15   CALCIUM 8.3* 8.9 8.4* 8.3*  CREATININE 1.69* 1.70* 1.71* 1.80*  GFRNONAA 43* 43* 43* 40*  GFRAA 50* 50* 50* 47*    LIVER  FUNCTION TESTS: Recent Labs    03/12/18 1451 06/13/18 0305 07/10/18 1513  BILITOT 0.2 0.5 0.3  AST 17 15 13   ALT 15 17 12   ALKPHOS 68 43 72  PROT 5.3* 4.7* 5.6*  ALBUMIN 2.9* 2.1* 3.0*    TUMOR MARKERS: No results for input(s): AFPTM, CEA, CA199, CHROMGRNA in the last 8760 hours.  Assessment and Plan:  58 y/o M with history of DM, CAD, HTN and CKD with recent worsening kidney function and BP control followed by Dr. Posey Pronto who presents today for a random renal biopsy.   Patient has been NPO since 11:45 pm yesterday, he had a few sips of water this AM with his blood pressure medications, he does not take blood thinning medications. Afebrile, WBC 10.4, hgb 15.8, plt 278, INR 0.9, BMP pending at time of this note writing.  Risks and benefits of random renal biopsy was discussed with the patient and/or patient's family including, but not limited to bleeding, infection, damage to adjacent structures or low yield requiring additional tests.  All of the questions were answered and there is agreement to proceed.  Consent signed and in chart.   Thank you for this interesting consult.  I greatly enjoyed meeting [employer] and look forward to participating in their care.  A copy of this report was sent to the requesting provider on this date.  Electronically Signed: Joaquim Nam, PA-C 03/11/2019, 7:38 AM   I spent a total of  30 Minutes   in face to face in clinical consultation, greater than 50% of which was counseling/coordinating care for random renal biopsy.

## 2019-03-11 NOTE — Discharge Instructions (Addendum)

## 2019-03-11 NOTE — Procedures (Signed)
  Procedure: [employer] renal   EBL:   minimal Complications:  none immediate  See full dictation in [facility].  Dillard Cannon MD Main # [phone] Pager  [phone]

## 2019-03-11 NOTE — Progress Notes (Signed)
Reviewed discharge instructions with [name] [name] voices understanding.

## 2019-03-11 NOTE — Progress Notes (Addendum)
Attempted to contact X 2 [name] [name] message left for her to call us back.  Reviewed D/C instructions with pt and left a message with d/c instructions

## 2019-04-08 ENCOUNTER — Encounter (HOSPITAL_COMMUNITY): Payer: Self-pay

## 2019-04-23 ENCOUNTER — Encounter (HOSPITAL_COMMUNITY): Payer: Medicaid Other

## 2019-04-24 ENCOUNTER — Other Ambulatory Visit (HOSPITAL_COMMUNITY): Payer: Self-pay | Admitting: *Deleted

## 2019-04-25 ENCOUNTER — Encounter (HOSPITAL_COMMUNITY)
Admission: RE | Admit: 2019-04-25 | Discharge: 2019-04-25 | Disposition: A | Discharge status: Home / Self Care | Payer: Medicaid Other | Source: Ambulatory Visit | Attending: Nephrology | Admitting: Nephrology

## 2019-04-25 ENCOUNTER — Other Ambulatory Visit: Payer: Self-pay

## 2019-04-25 DIAGNOSIS — D84821 Immunodeficiency due to drugs: Secondary | ICD-10-CM | POA: Insufficient documentation

## 2019-04-25 MED ORDER — DIPHENHYDRAMINE HCL 50 MG/ML IJ SOLN
INTRAMUSCULAR | Status: AC
  Administered 2019-04-25: 25 mg via INTRAVENOUS
  Filled 2019-04-25: qty 1

## 2019-04-25 MED ORDER — ACETAMINOPHEN 325 MG PO TABS
ORAL_TABLET | ORAL | Status: AC
  Administered 2019-04-25: 650 mg via ORAL
  Filled 2019-04-25: qty 2

## 2019-04-25 MED ORDER — METHYLPREDNISOLONE SODIUM SUCC 125 MG IJ SOLR
125.0000 mg | INTRAMUSCULAR | Status: DC
  Administered 2019-04-25: 09:00:00 125 mg via INTRAVENOUS

## 2019-04-25 MED ORDER — ACETAMINOPHEN 325 MG PO TABS
650.0000 mg | ORAL_TABLET | ORAL | Status: DC
  Administered 2019-04-25: 09:00:00 650 mg via ORAL

## 2019-04-25 MED ORDER — DIPHENHYDRAMINE HCL 50 MG/ML IJ SOLN
25.0000 mg | INTRAMUSCULAR | Status: DC
  Administered 2019-04-25: 09:00:00 25 mg via INTRAVENOUS

## 2019-04-25 MED ORDER — SODIUM CHLORIDE 0.9 % IV SOLN
1000.0000 mg | INTRAVENOUS | Status: DC
  Administered 2019-04-25: 1000 mg via INTRAVENOUS
  Filled 2019-04-25: qty 100

## 2019-04-25 MED ORDER — METHYLPREDNISOLONE SODIUM SUCC 125 MG IJ SOLR
INTRAMUSCULAR | Status: AC
  Administered 2019-04-25: 125 mg via INTRAVENOUS
  Filled 2019-04-25: qty 2

## 2019-04-25 NOTE — Discharge Instructions (Signed)
Rituximab injection What is this medicine? RITUXIMAB (ri TUX i mab) is a monoclonal antibody. It is used to treat certain types of cancer like non-Hodgkin lymphoma and chronic lymphocytic leukemia. It is also used to treat rheumatoid arthritis, granulomatosis with polyangiitis (or Wegener's granulomatosis), microscopic polyangiitis, and pemphigus vulgaris. This medicine may be used for other purposes; ask your health care provider or pharmacist if you have questions. COMMON BRAND NAME(S): Rituxan, RUXIENCE What should I tell my health care provider before I take this medicine? They need to know if you have any of these conditions:  heart disease  infection (especially a virus infection such as hepatitis B, chickenpox, cold sores, or herpes)  immune system problems  irregular heartbeat  kidney disease  low blood counts, like low white cell, platelet, or red cell counts  lung or breathing disease, like asthma  recently received or scheduled to receive a vaccine  an unusual or allergic reaction to rituximab, other medicines, foods, dyes, or preservatives  pregnant or trying to get pregnant  breast-feeding How should I use this medicine? This medicine is for infusion into a vein. It is administered in a hospital or clinic by a specially trained health care professional. A special MedGuide will be given to you by the pharmacist with each prescription and refill. Be sure to read this information carefully each time. Talk to your pediatrician regarding the use of this medicine in children. This medicine is not approved for use in children. Overdosage: If you think you have taken too much of this medicine contact a poison control center or emergency room at once. NOTE: This medicine is only for you. Do not share this medicine with others. What if I miss a dose? It is important not to miss a dose. Call your doctor or health care professional if you are unable to keep an appointment. What  may interact with this medicine?  cisplatin  live virus vaccines This list may not describe all possible interactions. Give your health care provider a list of all the medicines, herbs, non-prescription drugs, or dietary supplements you use. Also tell them if you smoke, drink alcohol, or use illegal drugs. Some items may interact with your medicine. What should I watch for while using this medicine? Your condition will be monitored carefully while you are receiving this medicine. You may need blood work done while you are taking this medicine. This medicine can cause serious allergic reactions. To reduce your risk you may need to take medicine before treatment with this medicine. Take your medicine as directed. In some patients, this medicine may cause a serious brain infection that may cause death. If you have any problems seeing, thinking, speaking, walking, or standing, tell your healthcare professional right away. If you cannot reach your healthcare professional, urgently seek other source of medical care. Call your doctor or health care professional for advice if you get a fever, chills or sore throat, or other symptoms of a cold or flu. Do not treat yourself. This drug decreases your body's ability to fight infections. Try to avoid being around people who are sick. Do not become pregnant while taking this medicine or for at least 12 months after stopping it. Women should inform their doctor if they wish to become pregnant or think they might be pregnant. There is a potential for serious side effects to an unborn child. Talk to your health care professional or pharmacist for more information. Do not breast-feed an infant while taking this medicine or for at   least 6 months after stopping it. What side effects may I notice from receiving this medicine? Side effects that you should report to your doctor or health care professional as soon as possible:  allergic reactions like skin rash, itching or  hives; swelling of the face, lips, or tongue  breathing problems  chest pain  changes in vision  diarrhea  headache with fever, neck stiffness, sensitivity to light, nausea, or confusion  fast, irregular heartbeat  loss of memory  low blood counts - this medicine may decrease the number of white blood cells, red blood cells and platelets. You may be at increased risk for infections and bleeding.  mouth sores  problems with balance, talking, or walking  redness, blistering, peeling or loosening of the skin, including inside the mouth  signs of infection - fever or chills, cough, sore throat, pain or difficulty passing urine  signs and symptoms of kidney injury like trouble passing urine or change in the amount of urine  signs and symptoms of liver injury like dark yellow or brown urine; general ill feeling or flu-like symptoms; light-colored stools; loss of appetite; nausea; right upper belly pain; unusually weak or tired; yellowing of the eyes or skin  signs and symptoms of low blood pressure like dizziness; feeling faint or lightheaded, falls; unusually weak or tired  stomach pain  swelling of the ankles, feet, hands  unusual bleeding or bruising  vomiting Side effects that usually do not require medical attention (report to your doctor or health care professional if they continue or are bothersome):  headache  joint pain  muscle cramps or muscle pain  nausea  tiredness This list may not describe all possible side effects. Call your doctor for medical advice about side effects. You may report side effects to FDA at 1-800-FDA-1088. Where should I keep my medicine? This drug is given in a hospital or clinic and will not be stored at home. NOTE: This sheet is a summary. It may not cover all possible information. If you have questions about this medicine, talk to your doctor, pharmacist, or health care provider.  2020 Elsevier/Gold Standard (2018-07-25  22:01:36)  

## 2019-04-29 LAB — QUANTIFERON-TB GOLD PLUS (RQFGPL)
QuantiFERON Mitogen Value: 5.96 IU/mL
QuantiFERON Nil Value: 0.02 IU/mL
QuantiFERON TB1 Ag Value: 0.01 IU/mL
QuantiFERON TB2 Ag Value: 0.02 IU/mL

## 2019-04-29 LAB — QUANTIFERON-TB GOLD PLUS: QuantiFERON-TB Gold Plus: NEGATIVE

## 2019-05-09 ENCOUNTER — Other Ambulatory Visit: Payer: Self-pay

## 2019-05-09 ENCOUNTER — Encounter (HOSPITAL_COMMUNITY)
Admission: RE | Admit: 2019-05-09 | Discharge: 2019-05-09 | Disposition: A | Discharge status: Home / Self Care | Payer: Medicaid Other | Source: Ambulatory Visit | Attending: Nephrology | Admitting: Nephrology

## 2019-05-09 DIAGNOSIS — N059 Unspecified nephritic syndrome with unspecified morphologic changes: Secondary | ICD-10-CM | POA: Diagnosis present

## 2019-05-09 MED ORDER — DIPHENHYDRAMINE HCL 50 MG/ML IJ SOLN
INTRAMUSCULAR | Status: AC
  Filled 2019-05-09: qty 1

## 2019-05-09 MED ORDER — DIPHENHYDRAMINE HCL 50 MG/ML IJ SOLN
25.0000 mg | INTRAMUSCULAR | Status: AC
  Administered 2019-05-09: 25 mg via INTRAVENOUS

## 2019-05-09 MED ORDER — METHYLPREDNISOLONE SODIUM SUCC 125 MG IJ SOLR
INTRAMUSCULAR | Status: AC
  Filled 2019-05-09: qty 2

## 2019-05-09 MED ORDER — METHYLPREDNISOLONE SODIUM SUCC 125 MG IJ SOLR
125.0000 mg | INTRAMUSCULAR | Status: AC
  Administered 2019-05-09: 125 mg via INTRAVENOUS

## 2019-05-09 MED ORDER — SODIUM CHLORIDE 0.9 % IV SOLN
1000.0000 mg | INTRAVENOUS | Status: AC
  Administered 2019-05-09: 09:00:00 1000 mg via INTRAVENOUS
  Filled 2019-05-09: qty 100

## 2019-05-09 MED ORDER — ACETAMINOPHEN 325 MG PO TABS
650.0000 mg | ORAL_TABLET | ORAL | Status: AC
  Administered 2019-05-09: 650 mg via ORAL

## 2019-05-09 MED ORDER — ACETAMINOPHEN 325 MG PO TABS
ORAL_TABLET | ORAL | Status: AC
  Filled 2019-05-09: qty 2

## 2019-05-21 ENCOUNTER — Encounter: Payer: Self-pay | Admitting: Nephrology

## 2019-05-21 DIAGNOSIS — N059 Unspecified nephritic syndrome with unspecified morphologic changes: Secondary | ICD-10-CM

## 2019-05-21 DIAGNOSIS — D84821 Immunodeficiency due to drugs: Secondary | ICD-10-CM | POA: Insufficient documentation

## 2019-05-21 DIAGNOSIS — Z79899 Other long term (current) drug therapy: Secondary | ICD-10-CM

## 2019-05-21 HISTORY — DX: Other long term (current) drug therapy: Z79.899

## 2019-05-21 HISTORY — DX: Other long term (current) drug therapy: D84.821

## 2019-05-21 HISTORY — DX: Unspecified nephritic syndrome with unspecified morphologic changes: N05.9

## 2019-06-14 ENCOUNTER — Telehealth: Payer: Self-pay | Admitting: Cardiology

## 2019-06-14 NOTE — Telephone Encounter (Signed)
Patient went to see Kidney dr and he is doing great with his BP now and he wanted you to know.Marland Kitchen

## 2019-06-14 NOTE — Telephone Encounter (Signed)
Just FYI.

## 2019-06-17 ENCOUNTER — Other Ambulatory Visit: Payer: Self-pay | Admitting: Family Medicine

## 2019-06-17 DIAGNOSIS — E1169 Type 2 diabetes mellitus with other specified complication: Secondary | ICD-10-CM

## 2019-07-19 ENCOUNTER — Other Ambulatory Visit: Payer: Self-pay | Admitting: Family Medicine

## 2019-07-19 DIAGNOSIS — F329 Major depressive disorder, single episode, unspecified: Secondary | ICD-10-CM

## 2019-07-19 DIAGNOSIS — F32A Depression, unspecified: Secondary | ICD-10-CM

## 2019-07-19 DIAGNOSIS — R569 Unspecified convulsions: Secondary | ICD-10-CM

## 2019-07-19 DIAGNOSIS — F419 Anxiety disorder, unspecified: Secondary | ICD-10-CM

## 2019-08-16 ENCOUNTER — Telehealth: Payer: Self-pay | Admitting: Cardiology

## 2019-08-16 DIAGNOSIS — E1169 Type 2 diabetes mellitus with other specified complication: Secondary | ICD-10-CM

## 2019-08-16 MED ORDER — ROSUVASTATIN CALCIUM 10 MG PO TABS: 10.0000 mg | ORAL_TABLET | Freq: Every day | ORAL | 0 refills | Status: DC

## 2019-08-16 NOTE — Telephone Encounter (Signed)
New message   Patient needs a new prescription for rosuvastatin (CRESTOR) 10 MG tablet sent to [place], [place] [address]

## 2019-08-16 NOTE — Telephone Encounter (Signed)
Rx(s) sent to pharmacy electronically.  

## 2019-08-17 ENCOUNTER — Other Ambulatory Visit: Payer: Self-pay | Admitting: Nephrology

## 2019-08-17 DIAGNOSIS — R109 Unspecified abdominal pain: Secondary | ICD-10-CM

## 2019-08-20 ENCOUNTER — Ambulatory Visit: Payer: Medicaid Other | Admitting: Cardiology

## 2019-08-31 NOTE — Progress Notes (Signed)
Cardiology Office Note:    Date:  09/02/2019   ID:  [name] Slade Sr., DOB [birth year], MRN [id number]  PCP:  Charlott Rakes, MD  Cardiologist:  Shirlee More, MD    Referring MD: Charlott Rakes, MD    ASSESSMENT:    1. Hypertensive kidney disease with stage 3 chronic kidney disease, unspecified whether stage 3a or 3b CKD   2. Hyperkalemia   3. Bilateral lower extremity edema   4. Hyperlipidemia, unspecified hyperlipidemia type    PLAN:    In order of problems listed above:  1. Stable BP at target continue current treatment with centrally active clonidine calcium channel blocker and ACE inhibitor.  Due for labs next week with his nephrologist with chronic hyperkalemia will restart his resin binder. 2. Lower extremity edema due to gabapentin resolved with discontinuation of drug he does not have heart failure 3. Stable hyperlipidemia continue his high intensity statin I asked him to have labs drawn including CMP lipids at the nephrology office next week 4. Coronary artery calcification no indication is having angina and will continue current medical therapy and at this time I do not think he requires another ischemia evaluation   Next appointment: 1 year   Medication Adjustments/Labs and Tests Ordered: Current medicines are reviewed at length with the patient today.  Concerns regarding medicines are outlined above.  No orders of the defined types were placed in this encounter.  No orders of the defined types were placed in this encounter.   Chief Complaint  Patient presents with  . Follow-up    Coronary artery calcification  . Hypertension  . Leg Swelling    History of Present Illness:    [name] Pry Sr. is a 59 y.o. male with a hx of edema with anasarca, type 2 diabetes mellitus (A1c 6.6), Seizures, chronic kidney disease with hyperkalemia, alcohol abuse hypertension,coronary artery calcifications, ACDF of C4,5 and C5,6 on 09/14/17   last seen 02/26/2020.  Myocardial  perfusion study performed 10/24/2017 with coronary artery calcification ejection fraction 50% low risk study no evidence of ischemia.  Echocardiogram performed at that time showed an estimated ejection fraction of 55 to 60% normal left ventricular function.  The fixed defect on nuclear was attenuation.  Pulmonary artery pressure was normal and no indication of right ventricular dysfunction. Compliance with diet, lifestyle and medications: Yes  03/11/2019: Na 128, K 4.0 Cr 1.96 GFR 36 cc/min  He had a renal biopsy done and has been treated with immunosuppressive therapy he does not know the name and I cannot find it in the electronic medical record.  He tells me his kidney function is stable GFR class III to class IV CKD and is due for labs next week with his nephrologist but has run out of his Lokelma for the last 2 to 3 weeks lokelma, he has chronic hyperkalemia has required ED treatment in the past I will renew the medication.  He is having no peripheral edema chest pain he does have shortness of breath and wheezing no palpitation or syncope.  Overall he feels improved but unfortunately still has a chronic lower extremity pain syndrome. Past Medical History:  Diagnosis Date  . Anxiety   . CAD (coronary artery disease) 10/06/2017  . COPD (chronic obstructive pulmonary disease) ([place]) 09/29/2017  . Cough   . Depression   . Dyspnea   . 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 Hypertension 09/29/2017  . 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 Myelopathy ([place]) 09/14/2017  . Tobacco abuse 09/29/2017  . Type 2 diabetes mellitus ([place]) 10/17/2011    Past Surgical History:  Procedure Laterality Date  . ANTERIOR CERVICAL DECOMP/DISCECTOMY FUSION N/A 09/14/2017   Procedure: ANTERIOR CERVICAL DECOMPRESSION/DISCECTOMY FUSIONCERVICAL 4- CERVICAL 5, CERVICAL 5- CERVICAL 6;  Surgeon: Kary Kos, MD;  Location: [facility];  Service:  Neurosurgery;  Laterality: N/A;  ANTERIOR CERVICAL DECOMPRESSION/DISCECTOMY FUSIONCERVICAL 4- CERVICAL 5, CERVICAL 5- CERVICAL 6    Current Medications: Current Meds  Medication Sig  . albuterol (VENTOLIN HFA) 108 (90 Base) MCG/ACT inhaler Inhale 2 puffs into the lungs every 6 (six) hours as needed for wheezing or shortness of breath. Must have office visit for refills  . amLODipine (NORVASC) 5 MG tablet Take 5 mg by mouth daily.  . cetirizine (ZYRTEC) 10 MG tablet Take 10 mg by mouth daily as needed for allergies.   . citalopram (CELEXA) 20 MG tablet Take 1 tablet (20 mg total) by mouth daily. Must have office visit for refills  . cloNIDine (CATAPRES) 0.2 MG tablet Take 1 tablet (0.2 mg total) by mouth 2 (two) times daily.  . Fluticasone-Salmeterol (ADVAIR DISKUS) 250-50 MCG/DOSE AEPB Inhale 1 puff into the lungs 2 (two) times daily.  . hydrOXYzine (ATARAX/VISTARIL) 25 MG tablet Take 1 tablet (25 mg total) by mouth 3 (three) times daily as needed for anxiety.  . levETIRAcetam (KEPPRA) 500 MG tablet Take 1 tablet (500 mg total) by mouth 2 (two) times daily. Must have office visit for refills  . lisinopril (ZESTRIL) 40 MG tablet Take 40 mg by mouth daily.  . rosuvastatin (CRESTOR) 10 MG tablet Take 1 tablet (10 mg total) by mouth daily.  . sodium zirconium cyclosilicate (LOKELMA) 10 g PACK packet Take 10 g by mouth as directed. Take on Monday, Wednesday, and Friday  . thiamine 100 MG tablet Take 1 tablet (100 mg total) by mouth daily.  . vitamin B-12 1000 MCG tablet Take 1 tablet (1,000 mcg total) by mouth daily.     Allergies:   Patient has no known allergies.   Social History   Socioeconomic History  . Marital status: Single    Spouse name: Not on file  . Number of children: Not on file  . Years of education: Not on file  . Highest education level: Not on file  Occupational History  . Not on file  Tobacco Use  . Smoking status: Current Every Day Smoker    Packs/day: 1.50     Years: 42.00    Pack years: 63.00    Types: Cigarettes  . Smokeless tobacco: Never Used  Substance and Sexual Activity  . Alcohol use: Yes    Comment: 28/week  . Drug use: Yes    Types: Marijuana  . Sexual activity: Not on file  Other Topics Concern  . Not on file  Social History Narrative  . Not on file   Social Determinants of Health   Financial Resource Strain:   . Difficulty of Paying Living Expenses: Not on file  Food Insecurity:   . Worried About Charity fundraiser in the Last Year: Not on file  . Ran Out of Food in the Last Year: Not on file  Transportation Needs:   . Lack of Transportation (Medical): Not on file  . Lack of Transportation (Non-Medical): Not on file  Physical Activity:   . Days of Exercise per Week: Not on file  . Minutes of Exercise per Session: Not on file  Stress:   . Feeling of Stress : Not on file  Social Connections:   . Frequency of Communication with Friends and Family: Not on file  . Frequency of Social Gatherings with Friends and Family: Not on file  . Attends Religious Services: Not on file  . Active Member of Clubs or Organizations: Not on file  . Attends Archivist Meetings: Not on file  . Marital Status: Not on file     Family History: The patient's family history includes Diabetes in his mother; Hypertension in his brother; Kidney cancer in his father; Lung cancer in his father; Scoliosis in his mother. ROS:   Please see the history of present illness.    All other systems reviewed and are negative.  EKGs/Labs/Other Studies Reviewed:    The following studies were reviewed today:  EKG:  EKG ordered today and personally reviewed.  The ekg ordered today demonstrates sinus rhythm with first-degree AV block  Recent Labs: 03/11/2019: BUN 25; Creatinine, Ser 1.96; Hemoglobin 15.8; Platelets 278; Potassium 4.0; Sodium 128  Recent Lipid Panel    Component Value Date/Time   CHOL 330 (H) 12/05/2017 1635   TRIG 107  12/05/2017 1635   HDL 66 12/05/2017 1635   CHOLHDL 5.0 12/05/2017 1635   LDLCALC 243 (H) 12/05/2017 1635    Physical Exam:    VS:  BP 128/82   Pulse 75   Temp 97.9 F (36.6 C)   Ht 6\' 2"  (1.88 m)   Wt 224 lb (101.6 kg)   SpO2 97%   BMI 28.76 kg/m     Wt Readings from Last 3 Encounters:  09/02/19 224 lb (101.6 kg)  05/09/19 220 lb (99.8 kg)  04/25/19 220 lb (99.8 kg)     GEN:  Well nourished, well developed in no acute distress HEENT: Normal NECK: No JVD; No carotid bruits LYMPHATICS: No lymphadenopathy CARDIAC: \RRR, no murmurs, rubs, gallops RESPIRATORY:  Clear to auscultation without rales, wheezing or rhonchi  ABDOMEN: Soft, non-tender, non-distended MUSCULOSKELETAL:  No edema; No deformity  SKIN: Warm and dry NEUROLOGIC:  Alert and oriented x 3 PSYCHIATRIC:  Normal affect    Signed, Shirlee More, MD  09/02/2019 8:49 AM    [facility]

## 2019-09-02 ENCOUNTER — Ambulatory Visit (INDEPENDENT_AMBULATORY_CARE_PROVIDER_SITE_OTHER): Payer: Medicaid Other | Admitting: Cardiology

## 2019-09-02 ENCOUNTER — Other Ambulatory Visit: Payer: Self-pay

## 2019-09-02 ENCOUNTER — Encounter: Payer: Self-pay | Admitting: Cardiology

## 2019-09-02 ENCOUNTER — Other Ambulatory Visit: Payer: Self-pay | Admitting: Cardiology

## 2019-09-02 VITALS — BP 128/82 | HR 75 | Temp 97.9°F | Ht 74.0 in | Wt 224.0 lb

## 2019-09-02 DIAGNOSIS — I2510 Atherosclerotic heart disease of native coronary artery without angina pectoris: Secondary | ICD-10-CM

## 2019-09-02 DIAGNOSIS — R600 Localized edema: Secondary | ICD-10-CM

## 2019-09-02 DIAGNOSIS — E785 Hyperlipidemia, unspecified: Secondary | ICD-10-CM | POA: Diagnosis not present

## 2019-09-02 DIAGNOSIS — E1169 Type 2 diabetes mellitus with other specified complication: Secondary | ICD-10-CM

## 2019-09-02 DIAGNOSIS — I129 Hypertensive chronic kidney disease with stage 1 through stage 4 chronic kidney disease, or unspecified chronic kidney disease: Secondary | ICD-10-CM | POA: Diagnosis not present

## 2019-09-02 DIAGNOSIS — E875 Hyperkalemia: Secondary | ICD-10-CM | POA: Diagnosis not present

## 2019-09-02 DIAGNOSIS — N183 Chronic kidney disease, stage 3 unspecified: Secondary | ICD-10-CM

## 2019-09-02 MED ORDER — CLONIDINE HCL 0.2 MG PO TABS: 0.2000 mg | ORAL_TABLET | Freq: Two times a day (BID) | ORAL | 3 refills | Status: DC

## 2019-09-02 MED ORDER — ROSUVASTATIN CALCIUM 10 MG PO TABS: 10.0000 mg | ORAL_TABLET | Freq: Every day | ORAL | 3 refills | Status: DC

## 2019-09-02 MED ORDER — LOKELMA 10 G PO PACK: 10.0000 g | PACK | ORAL | 3 refills | Status: DC

## 2019-09-02 NOTE — Patient Instructions (Addendum)
Medication Instructions:  *If you need a refill on your cardiac medications before your next appointment, please call your pharmacy*   Lab Work Your physician recommends that you have lab work done with Dr. Posey Pronto at [facility]. CMP and lipids on 09/10/19. Labs can be faxed to [phone]  If you have labs (blood work) drawn today and your tests are completely normal, you will receive your results only by: Marland Kitchen My Chart Message (if you have MyChart) OR . A paper copy in the mail If you have any lab test that is abnormal or we need to change your treatment, we will call you to review the results.  Follow-Up: At [facility], you and your health needs are our priority.  As part of our continuing mission to provide you with exceptional heart care, we have created designated Provider Care Teams.  These Care Teams include your primary Cardiologist (physician) and Advanced Practice Providers (APPs -  Physician Assistants and Nurse Practitioners) who all work together to provide you with the care you need, when you need it.  We recommend signing up for the patient portal called "MyChart".  Sign up information is provided on this After Visit Summary.  MyChart is used to connect with patients for Virtual Visits (Telemedicine).  Patients are able to view lab/test results, encounter notes, upcoming appointments, etc.  Non-urgent messages can be sent to your provider as well.   To learn more about what you can do with MyChart, go to [email or web address].    Your next appointment:   12 month(s)  The format for your next appointment:   In Person  Provider:   You may see Dr. Bettina Gavia or the following Advanced Practice Provider on your designated Care Team:    Laurann Montana, FNP

## 2019-09-17 ENCOUNTER — Other Ambulatory Visit: Payer: Self-pay | Admitting: Family Medicine

## 2019-09-17 DIAGNOSIS — F329 Major depressive disorder, single episode, unspecified: Secondary | ICD-10-CM

## 2019-09-17 DIAGNOSIS — F32A Depression, unspecified: Secondary | ICD-10-CM

## 2019-09-17 DIAGNOSIS — J438 Other emphysema: Secondary | ICD-10-CM

## 2019-09-17 DIAGNOSIS — R569 Unspecified convulsions: Secondary | ICD-10-CM

## 2019-09-17 DIAGNOSIS — F419 Anxiety disorder, unspecified: Secondary | ICD-10-CM

## 2019-10-07 ENCOUNTER — Ambulatory Visit: Payer: Medicaid Other | Attending: Family Medicine | Admitting: Family Medicine

## 2019-10-07 ENCOUNTER — Encounter: Payer: Self-pay | Admitting: Family Medicine

## 2019-10-07 ENCOUNTER — Encounter: Payer: Medicaid Other | Admitting: Licensed Clinical Social Worker

## 2019-10-07 ENCOUNTER — Other Ambulatory Visit: Payer: Self-pay

## 2019-10-07 VITALS — BP 155/81 | HR 69 | Ht 74.0 in | Wt 225.0 lb

## 2019-10-07 DIAGNOSIS — E1121 Type 2 diabetes mellitus with diabetic nephropathy: Secondary | ICD-10-CM | POA: Diagnosis not present

## 2019-10-07 DIAGNOSIS — R569 Unspecified convulsions: Secondary | ICD-10-CM | POA: Diagnosis not present

## 2019-10-07 DIAGNOSIS — F419 Anxiety disorder, unspecified: Secondary | ICD-10-CM

## 2019-10-07 DIAGNOSIS — J438 Other emphysema: Secondary | ICD-10-CM

## 2019-10-07 DIAGNOSIS — F32A Depression, unspecified: Secondary | ICD-10-CM

## 2019-10-07 DIAGNOSIS — I10 Essential (primary) hypertension: Secondary | ICD-10-CM

## 2019-10-07 DIAGNOSIS — N1832 Chronic kidney disease, stage 3b: Secondary | ICD-10-CM

## 2019-10-07 DIAGNOSIS — F329 Major depressive disorder, single episode, unspecified: Secondary | ICD-10-CM

## 2019-10-07 LAB — POCT GLYCOSYLATED HEMOGLOBIN (HGB A1C): HbA1c, POC (controlled diabetic range): 6.5 % (ref 0.0–7.0)

## 2019-10-07 LAB — GLUCOSE, POCT (MANUAL RESULT ENTRY): POC Glucose: 184 mg/dl — AB (ref 70–99)

## 2019-10-07 MED ORDER — CITALOPRAM HYDROBROMIDE 40 MG PO TABS: 40.0000 mg | ORAL_TABLET | Freq: Every day | ORAL | 6 refills | Status: DC

## 2019-10-07 MED ORDER — FLUTICASONE-SALMETEROL 250-50 MCG/DOSE IN AEPB: INHALATION_SPRAY | RESPIRATORY_TRACT | 6 refills | Status: DC

## 2019-10-07 MED ORDER — HYDROXYZINE HCL 25 MG PO TABS: 25.0000 mg | ORAL_TABLET | Freq: Three times a day (TID) | ORAL | 3 refills | Status: DC | PRN

## 2019-10-07 MED ORDER — ALBUTEROL SULFATE HFA 108 (90 BASE) MCG/ACT IN AERS: INHALATION_SPRAY | RESPIRATORY_TRACT | 6 refills | Status: DC

## 2019-10-07 MED ORDER — LEVETIRACETAM 500 MG PO TABS: 500.0000 mg | ORAL_TABLET | Freq: Two times a day (BID) | ORAL | 6 refills | Status: DC

## 2019-10-07 NOTE — Progress Notes (Signed)
Subjective:  Patient ID: [name] Slade Sr., male    DOB: 1961/03/02  Age: 59 y.o. MRN: [id number]  CC: Diabetes   HPI [name] Callens Sr.  is a 59 year old male with a medical history of of type 2 diabetes mellitus (A1c 6.5), Seizures, chronic kidney disease, alcohol abuse hypertension, alcohol abuse, coronary artery calcifications, ACDF of C4,5 and C5,6 on 09/14/17  By Dr Saintclair Halsted secondary to Myelopathy seen for a follow-up visit.  BP has been 118/75 - 155/89 reviewed from his home blood pressure log; medications adjusted by his Cardiologist recently and dose of amlodipine was increased as well as dose of carvedilol. He is scheduled to have labs next week with his Nephrology. He broke up with his girlfriend and this has him depressed; he is currently on Celexa and hydroxyzine.  He states they both had too much to drink and then got in an agreement with his girlfriend.  He does have some slight pedal edema and has been on torsemide but denies dyspnea, chest pain weight gain. He has had no recent seizures. With regards to his diabetes mellitus he denies hypoglycemia.  Past Medical History:  Diagnosis Date  . Anxiety   . CAD (coronary artery disease) 10/06/2017  . COPD (chronic obstructive pulmonary disease) ([place]) 09/29/2017  . Cough   . Depression   . Dyspnea   . 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 Hypertension 09/29/2017  . 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 Myelopathy ([place]) 09/14/2017  . Tobacco abuse 09/29/2017  . Type 2 diabetes mellitus ([place]) 10/17/2011    Past Surgical History:  Procedure Laterality Date  . ANTERIOR CERVICAL DECOMP/DISCECTOMY FUSION N/A 09/14/2017   Procedure: ANTERIOR CERVICAL DECOMPRESSION/DISCECTOMY FUSIONCERVICAL 4- CERVICAL 5, CERVICAL 5- CERVICAL 6;  Surgeon: Kary Kos, MD;  Location: [facility];  Service: Neurosurgery;  Laterality: N/A;  ANTERIOR CERVICAL DECOMPRESSION/DISCECTOMY  FUSIONCERVICAL 4- CERVICAL 5, CERVICAL 5- CERVICAL 6    Family History  Problem Relation Age of Onset  . Diabetes Mother   . Scoliosis Mother   . Kidney cancer Father   . Lung cancer Father   . Hypertension Brother     No Known Allergies  Outpatient Medications Prior to Visit  Medication Sig Dispense Refill  . ADVAIR DISKUS 250-50 MCG/DOSE AEPB INHALE 1 PUFF INTO THE LUNGS 2 (TWO) TIMES DAILY. 60 each 0  . albuterol (VENTOLIN HFA) 108 (90 Base) MCG/ACT inhaler INHALE 2 PUFFS INTO THE LUNGS EVERY 6 (SIX) HOURS AS NEEDED FOR WHEEZING OR SHORTNESS OF BREATH. 18 g 0  . amLODipine (NORVASC) 5 MG tablet Take 5 mg by mouth daily.    . carvedilol (COREG) 3.125 MG tablet Take 3.125 mg by mouth 2 (two) times daily with a meal.    . cetirizine (ZYRTEC) 10 MG tablet Take 10 mg by mouth daily as needed for allergies.     . citalopram (CELEXA) 20 MG tablet TAKE 1 TABLET (20 MG TOTAL) BY MOUTH DAILY. MUST HAVE OFFICE VISIT FOR REFILLS 30 tablet 0  . cloNIDine (CATAPRES) 0.2 MG tablet Take 1 tablet (0.2 mg total) by mouth 2 (two) times daily. (Patient taking differently: Take 0.2 mg by mouth 3 (three) times daily. ) 180 tablet 3  . hydrOXYzine (ATARAX/VISTARIL) 25 MG tablet Take 1 tablet (25 mg total) by mouth 3 (three) times daily as needed for anxiety. 90 tablet 3  . levETIRAcetam (KEPPRA) 500 MG tablet TAKE 1 TABLET (500 MG  TOTAL) BY MOUTH 2 (TWO) TIMES DAILY. MUST HAVE OFFICE VISIT FOR REFILLS 60 tablet 0  . lisinopril (ZESTRIL) 40 MG tablet Take 40 mg by mouth daily.    . rosuvastatin (CRESTOR) 10 MG tablet Take 1 tablet (10 mg total) by mouth daily. 90 tablet 3  . sodium zirconium cyclosilicate (LOKELMA) 10 g PACK packet Take 10 g by mouth as directed. Take on Monday, Wednesday, and Friday 36 each 3  . thiamine 100 MG tablet Take 1 tablet (100 mg total) by mouth daily. 30 tablet 0  . vitamin B-12 1000 MCG tablet Take 1 tablet (1,000 mcg total) by mouth daily. 30 tablet 0   No  facility-administered medications prior to visit.     ROS Review of Systems  Constitutional: Negative for activity change and appetite change.  HENT: Negative for sinus pressure and sore throat.   Eyes: Negative for visual disturbance.  Respiratory: Negative for cough, chest tightness and shortness of breath.   Cardiovascular: Positive for leg swelling. Negative for chest pain.  Gastrointestinal: Negative for abdominal distention, abdominal pain, constipation and diarrhea.  Endocrine: Negative.   Genitourinary: Negative for dysuria.  Musculoskeletal: Negative for joint swelling and myalgias.  Skin: Negative for rash.  Allergic/Immunologic: Negative.   Neurological: Negative for weakness, light-headedness and numbness.  Psychiatric/Behavioral: Negative for dysphoric mood and suicidal ideas.    Objective:  BP (!) 155/81   Pulse 69   Ht '6\' 2"'$  (1.88 m)   Wt 225 lb (102.1 kg)   SpO2 96%   BMI 28.89 kg/m   BP/Weight 10/07/2019 09/02/2019 09/81/1914  Systolic BP 782 956 213  Diastolic BP 81 82 76  Wt. (Lbs) 225 224 220  BMI 28.89 28.76 28.25      Physical Exam Constitutional:      Appearance: He is well-developed.  Neck:     Vascular: No JVD.  Cardiovascular:     Rate and Rhythm: Normal rate.     Heart sounds: Normal heart sounds. No murmur.  Pulmonary:     Effort: Pulmonary effort is normal.     Breath sounds: Normal breath sounds. No wheezing or rales.  Chest:     Chest wall: No tenderness.  Abdominal:     General: Bowel sounds are normal. There is no distension.     Palpations: Abdomen is soft. There is no mass.     Tenderness: There is no abdominal tenderness.  Musculoskeletal:        General: Normal range of motion.     Right lower leg: No edema.     Left lower leg: No edema.  Neurological:     Mental Status: He is alert and oriented to person, place, and time.  Psychiatric:        Mood and Affect: Mood normal.     CMP Latest Ref Rng & Units 03/11/2019  09/06/2018 08/09/2018  Glucose 70 - 99 mg/dL 158(H) 175(H) 171(H)  BUN 6 - 20 mg/dL 25(H) 15 17  Creatinine 0.61 - 1.24 mg/dL 1.96(H) 1.80(H) 1.71(H)  Sodium 135 - 145 mmol/L 128(L) 135 138  Potassium 3.5 - 5.1 mmol/L 4.0 4.8 5.4(H)  Chloride 98 - 111 mmol/L 97(L) 103 102  CO2 22 - 32 mmol/L 23 19(L) 22  Calcium 8.9 - 10.3 mg/dL 7.9(L) 8.3(L) 8.4(L)  Total Protein 6.0 - 8.5 g/dL - - -  Total Bilirubin 0.0 - 1.2 mg/dL - - -  Alkaline Phos 39 - 117 IU/L - - -  AST 0 - 40 IU/L - - -  ALT 0 - 44 IU/L - - -    Lipid Panel     Component Value Date/Time   CHOL 330 (H) 12/05/2017 1635   TRIG 107 12/05/2017 1635   HDL 66 12/05/2017 1635   CHOLHDL 5.0 12/05/2017 1635   LDLCALC 243 (H) 12/05/2017 1635    CBC    Component Value Date/Time   WBC 10.4 03/11/2019 0720   RBC 4.90 03/11/2019 0720   HGB 15.8 03/11/2019 0720   HCT 46.9 03/11/2019 0720   PLT 278 03/11/2019 0720   MCV 95.7 03/11/2019 0720   MCH 32.2 03/11/2019 0720   MCHC 33.7 03/11/2019 0720   RDW 12.2 03/11/2019 0720   LYMPHSABS 2.4 10/17/2017 1735   MONOABS 1.2 (H) 10/17/2017 1735   EOSABS 0.2 10/17/2017 1735   BASOSABS 0.0 10/17/2017 1735    Lab Results  Component Value Date   HGBA1C 6.5 10/07/2019    Assessment & Plan:   1. Type 2 diabetes mellitus with stage 3b chronic kidney disease, without long-term current use of insulin (HCC) Diabetes is controlled with A1c of 6.5 No regimen change Counseled on Diabetic diet, my plate method, 545 minutes of moderate intensity exercise/week Blood sugar logs with fasting goals of 80-120 mg/dl, random of less than 180 and in the event of sugars less than 60 mg/dl or greater than 400 mg/dl encouraged to notify the clinic. Advised on the need for annual eye exams, annual foot exams, Pneumonia vaccine. Follow-up with nephrology for CKD - POCT glucose (manual entry) - POCT glycosylated hemoglobin (Hb A1C) - CMP14+EGFR  2. Anxiety and depression Uncontrolled Worsened by  recent break-up Increase Celexa dose We will have LCSW today - citalopram (CELEXA) 40 MG tablet; Take 1 tablet (40 mg total) by mouth daily.  Dispense: 30 tablet; Refill: 6 - hydrOXYzine (ATARAX/VISTARIL) 25 MG tablet; Take 1 tablet (25 mg total) by mouth 3 (three) times daily as needed for anxiety.  Dispense: 90 tablet; Refill: 3  3. Other emphysema ([place]) Stable with no exacerbations - Fluticasone-Salmeterol (ADVAIR DISKUS) 250-50 MCG/DOSE AEPB; INHALE 1 PUFF INTO THE LUNGS 2 (TWO) TIMES DAILY.  Dispense: 60 each; Refill: 6 - albuterol (VENTOLIN HFA) 108 (90 Base) MCG/ACT inhaler; INHALE 2 PUFFS INTO THE LUNGS EVERY 6 (SIX) HOURS AS NEEDED FOR WHEEZING OR SHORTNESS OF BREATH.  Dispense: 18 g; Refill: 6  4. Seizure ([place]) No recent seizure - levETIRAcetam (KEPPRA) 500 MG tablet; Take 1 tablet (500 mg total) by mouth 2 (two) times daily.  Dispense: 60 tablet; Refill: 6  5. Essential hypertension Uncontrolled He recently had a dose of his antihypertensives increased We will defer to his nephrologist and cardiologist who has been adjusting his doses to prevent discrepancy as most recent nephrology notes are not available Counseled on blood pressure goal of less than 130/80, low-sodium, DASH diet, medication compliance, 150 minutes of moderate intensity exercise per week. Discussed medication compliance, adverse effects. - carvedilol (COREG) 3.125 MG tablet; Take 3.125 mg by mouth 2 (two) times daily with a meal.   Return in about 3 months (around 01/06/2020) for chronic medical conditions.   Charlott Rakes, MD, FAAFP. [facility] [place], [place]   10/07/2019, 2:26 PM

## 2019-10-08 LAB — CMP14+EGFR
ALT: 15 IU/L (ref 0–44)
AST: 18 IU/L (ref 0–40)
Albumin/Globulin Ratio: 1.7 (ref 1.2–2.2)
Albumin: 3.7 g/dL — ABNORMAL LOW (ref 3.8–4.9)
Alkaline Phosphatase: 69 IU/L (ref 39–117)
BUN/Creatinine Ratio: 15 (ref 10–24)
BUN: 35 mg/dL — ABNORMAL HIGH (ref 8–27)
Bilirubin Total: 0.3 mg/dL (ref 0.0–1.2)
CO2: 15 mmol/L — ABNORMAL LOW (ref 20–29)
Calcium: 9.3 mg/dL (ref 8.6–10.2)
Chloride: 105 mmol/L (ref 96–106)
Creatinine, Ser: 2.4 mg/dL — ABNORMAL HIGH (ref 0.76–1.27)
GFR calc Af Amer: 33 mL/min/{1.73_m2} — ABNORMAL LOW (ref >59)
GFR calc non Af Amer: 28 mL/min/{1.73_m2} — ABNORMAL LOW (ref >59)
Globulin, Total: 2.2 g/dL (ref 1.5–4.5)
Glucose: 143 mg/dL — ABNORMAL HIGH (ref 65–99)
Potassium: 5.6 mmol/L — ABNORMAL HIGH (ref 3.5–5.2)
Sodium: 139 mmol/L (ref 134–144)
Total Protein: 5.9 g/dL — ABNORMAL LOW (ref 6.0–8.5)

## 2019-10-11 ENCOUNTER — Other Ambulatory Visit: Payer: Self-pay | Admitting: *Deleted

## 2019-10-11 ENCOUNTER — Telehealth: Payer: Self-pay | Admitting: *Deleted

## 2019-10-11 DIAGNOSIS — E875 Hyperkalemia: Secondary | ICD-10-CM

## 2019-10-11 DIAGNOSIS — N189 Chronic kidney disease, unspecified: Secondary | ICD-10-CM

## 2019-10-11 LAB — COMPREHENSIVE METABOLIC PANEL WITH GFR
ALT: 14 IU/L (ref 0–44)
AST: 13 IU/L (ref 0–40)
Albumin/Globulin Ratio: 1.6 (ref 1.2–2.2)
Albumin: 3.3 g/dL — ABNORMAL LOW (ref 3.8–4.9)
Alkaline Phosphatase: 61 IU/L (ref 39–117)
BUN/Creatinine Ratio: 15 (ref 10–24)
BUN: 42 mg/dL — ABNORMAL HIGH (ref 8–27)
Bilirubin Total: 0.2 mg/dL (ref 0.0–1.2)
CO2: 16 mmol/L — ABNORMAL LOW (ref 20–29)
Calcium: 9.1 mg/dL (ref 8.6–10.2)
Chloride: 107 mmol/L — ABNORMAL HIGH (ref 96–106)
Creatinine, Ser: 2.72 mg/dL — ABNORMAL HIGH (ref 0.76–1.27)
GFR calc Af Amer: 28 mL/min/1.73 — ABNORMAL LOW
GFR calc non Af Amer: 24 mL/min/1.73 — ABNORMAL LOW
Globulin, Total: 2.1 g/dL (ref 1.5–4.5)
Glucose: 136 mg/dL — ABNORMAL HIGH (ref 65–99)
Potassium: 6.2 mmol/L — ABNORMAL HIGH (ref 3.5–5.2)
Sodium: 136 mmol/L (ref 134–144)
Total Protein: 5.4 g/dL — ABNORMAL LOW (ref 6.0–8.5)

## 2019-10-11 LAB — LIPID PANEL
Chol/HDL Ratio: 3.5 ratio (ref 0.0–5.0)
Cholesterol, Total: 223 mg/dL — ABNORMAL HIGH (ref 100–199)
HDL: 63 mg/dL (ref >39)
LDL Chol Calc (NIH): 141 mg/dL — ABNORMAL HIGH (ref 0–99)
Triglycerides: 105 mg/dL (ref 0–149)
VLDL Cholesterol Cal: 19 mg/dL (ref 5–40)

## 2019-10-11 MED ORDER — LOKELMA 10 G PO PACK: 10.0000 g | PACK | Freq: Every day | ORAL | 3 refills | Status: DC

## 2019-10-11 MED ORDER — EZETIMIBE 10 MG PO TABS: 10.0000 mg | ORAL_TABLET | Freq: Every day | ORAL | 6 refills | Status: DC

## 2019-10-11 NOTE — Telephone Encounter (Signed)
-----   Message from Richardo Priest, MD sent at 10/11/2019  8:16 AM EDT ----- Normal or stable result  Improved with cholesterol triglycerides remain quite elevated he should start taking Zetia 10 mg plus a statin.  His kidney function is slowly worsening  Same as elevated like him to take the [facility] daily and he needs a follow-up BMP early next week

## 2019-10-11 NOTE — Telephone Encounter (Signed)
Lab results and recommendations from Dr Bettina Gavia reviewed with patient.  Will send prescriptions for Zetia and increased dose of Lokelma to [place]. Patient will come to office for BMP on 4/19 Patient would like printed copy of lab work when he comes in for lab draw.

## 2019-10-15 ENCOUNTER — Telehealth: Payer: Self-pay

## 2019-10-15 LAB — BASIC METABOLIC PANEL
BUN/Creatinine Ratio: 15 (ref 10–24)
BUN: 34 mg/dL — ABNORMAL HIGH (ref 8–27)
CO2: 18 mmol/L — ABNORMAL LOW (ref 20–29)
Calcium: 9 mg/dL (ref 8.6–10.2)
Chloride: 106 mmol/L (ref 96–106)
Creatinine, Ser: 2.26 mg/dL — ABNORMAL HIGH (ref 0.76–1.27)
GFR calc Af Amer: 35 mL/min/{1.73_m2} — ABNORMAL LOW (ref >59)
GFR calc non Af Amer: 30 mL/min/{1.73_m2} — ABNORMAL LOW (ref >59)
Glucose: 134 mg/dL — ABNORMAL HIGH (ref 65–99)
Potassium: 5.7 mmol/L — ABNORMAL HIGH (ref 3.5–5.2)
Sodium: 136 mmol/L (ref 134–144)

## 2019-10-15 NOTE — Telephone Encounter (Signed)
Spoke with patient regarding results and recommendation.  Patient verbalizes understanding and is agreeable to plan of care. Advised patient to call back with any issues or concerns.  

## 2019-10-15 NOTE — Telephone Encounter (Signed)
-----   Message from Richardo Priest, MD sent at 10/15/2019 12:10 PM EDT ----- Normal or stable result  Letter to stay on lokelma daily

## 2019-10-21 ENCOUNTER — Other Ambulatory Visit (HOSPITAL_COMMUNITY): Payer: Self-pay

## 2019-10-22 ENCOUNTER — Other Ambulatory Visit: Payer: Self-pay

## 2019-10-22 ENCOUNTER — Ambulatory Visit (HOSPITAL_COMMUNITY)
Admission: RE | Admit: 2019-10-22 | Discharge: 2019-10-22 | Disposition: A | Discharge status: Home / Self Care | Payer: Medicaid Other | Source: Ambulatory Visit | Attending: Nephrology | Admitting: Nephrology

## 2019-10-22 DIAGNOSIS — N059 Unspecified nephritic syndrome with unspecified morphologic changes: Secondary | ICD-10-CM | POA: Insufficient documentation

## 2019-10-22 MED ORDER — DIPHENHYDRAMINE HCL 50 MG/ML IJ SOLN
INTRAMUSCULAR | Status: AC
  Filled 2019-10-22: qty 1

## 2019-10-22 MED ORDER — METHYLPREDNISOLONE SODIUM SUCC 125 MG IJ SOLR
125.0000 mg | Freq: Once | INTRAMUSCULAR | Status: AC
  Administered 2019-10-22: 125 mg via INTRAVENOUS

## 2019-10-22 MED ORDER — METHYLPREDNISOLONE SODIUM SUCC 125 MG IJ SOLR
INTRAMUSCULAR | Status: AC
  Filled 2019-10-22: qty 2

## 2019-10-22 MED ORDER — ACETAMINOPHEN 325 MG PO TABS
650.0000 mg | ORAL_TABLET | Freq: Once | ORAL | Status: AC
  Administered 2019-10-22: 650 mg via ORAL

## 2019-10-22 MED ORDER — SODIUM CHLORIDE 0.9 % IV SOLN
1000.0000 mg | Freq: Once | INTRAVENOUS | Status: AC
  Administered 2019-10-22: 1000 mg via INTRAVENOUS
  Filled 2019-10-22: qty 100

## 2019-10-22 MED ORDER — DIPHENHYDRAMINE HCL 50 MG/ML IJ SOLN
25.0000 mg | Freq: Once | INTRAMUSCULAR | Status: AC
  Administered 2019-10-22: 25 mg via INTRAVENOUS

## 2019-10-22 MED ORDER — ACETAMINOPHEN 325 MG PO TABS
ORAL_TABLET | ORAL | Status: AC
  Filled 2019-10-22: qty 2

## 2019-10-23 ENCOUNTER — Other Ambulatory Visit: Payer: Self-pay | Admitting: Nephrology

## 2019-10-24 ENCOUNTER — Other Ambulatory Visit: Payer: Self-pay | Admitting: Nephrology

## 2020-01-06 ENCOUNTER — Ambulatory Visit: Payer: Medicaid Other | Admitting: Family Medicine

## 2020-01-09 ENCOUNTER — Other Ambulatory Visit: Payer: Self-pay | Admitting: Nephrology

## 2020-01-22 ENCOUNTER — Other Ambulatory Visit: Payer: Self-pay | Admitting: Nephrology

## 2020-01-23 ENCOUNTER — Ambulatory Visit: Payer: Medicaid Other | Attending: Family Medicine | Admitting: Family Medicine

## 2020-01-23 ENCOUNTER — Encounter: Payer: Self-pay | Admitting: Family Medicine

## 2020-01-23 ENCOUNTER — Other Ambulatory Visit: Payer: Self-pay

## 2020-01-23 VITALS — BP 94/60 | HR 51 | Ht 74.0 in | Wt 222.0 lb

## 2020-01-23 DIAGNOSIS — J449 Chronic obstructive pulmonary disease, unspecified: Secondary | ICD-10-CM | POA: Diagnosis not present

## 2020-01-23 DIAGNOSIS — I2510 Atherosclerotic heart disease of native coronary artery without angina pectoris: Secondary | ICD-10-CM | POA: Diagnosis not present

## 2020-01-23 DIAGNOSIS — F419 Anxiety disorder, unspecified: Secondary | ICD-10-CM | POA: Insufficient documentation

## 2020-01-23 DIAGNOSIS — R251 Tremor, unspecified: Secondary | ICD-10-CM | POA: Diagnosis not present

## 2020-01-23 DIAGNOSIS — Z833 Family history of diabetes mellitus: Secondary | ICD-10-CM | POA: Diagnosis not present

## 2020-01-23 DIAGNOSIS — R569 Unspecified convulsions: Secondary | ICD-10-CM | POA: Insufficient documentation

## 2020-01-23 DIAGNOSIS — Z981 Arthrodesis status: Secondary | ICD-10-CM | POA: Diagnosis not present

## 2020-01-23 DIAGNOSIS — F329 Major depressive disorder, single episode, unspecified: Secondary | ICD-10-CM | POA: Insufficient documentation

## 2020-01-23 DIAGNOSIS — N059 Unspecified nephritic syndrome with unspecified morphologic changes: Secondary | ICD-10-CM

## 2020-01-23 DIAGNOSIS — Z7951 Long term (current) use of inhaled steroids: Secondary | ICD-10-CM | POA: Diagnosis not present

## 2020-01-23 DIAGNOSIS — Z1211 Encounter for screening for malignant neoplasm of colon: Secondary | ICD-10-CM | POA: Insufficient documentation

## 2020-01-23 DIAGNOSIS — I129 Hypertensive chronic kidney disease with stage 1 through stage 4 chronic kidney disease, or unspecified chronic kidney disease: Secondary | ICD-10-CM | POA: Insufficient documentation

## 2020-01-23 DIAGNOSIS — F1010 Alcohol abuse, uncomplicated: Secondary | ICD-10-CM | POA: Insufficient documentation

## 2020-01-23 DIAGNOSIS — N184 Chronic kidney disease, stage 4 (severe): Secondary | ICD-10-CM | POA: Diagnosis not present

## 2020-01-23 DIAGNOSIS — E1122 Type 2 diabetes mellitus with diabetic chronic kidney disease: Secondary | ICD-10-CM | POA: Diagnosis present

## 2020-01-23 DIAGNOSIS — E875 Hyperkalemia: Secondary | ICD-10-CM | POA: Diagnosis not present

## 2020-01-23 DIAGNOSIS — I10 Essential (primary) hypertension: Secondary | ICD-10-CM | POA: Diagnosis not present

## 2020-01-23 DIAGNOSIS — F32A Depression, unspecified: Secondary | ICD-10-CM

## 2020-01-23 DIAGNOSIS — Z720 Tobacco use: Secondary | ICD-10-CM

## 2020-01-23 DIAGNOSIS — Z79899 Other long term (current) drug therapy: Secondary | ICD-10-CM | POA: Insufficient documentation

## 2020-01-23 DIAGNOSIS — Z8249 Family history of ischemic heart disease and other diseases of the circulatory system: Secondary | ICD-10-CM | POA: Diagnosis not present

## 2020-01-23 LAB — POCT GLYCOSYLATED HEMOGLOBIN (HGB A1C): HbA1c, POC (controlled diabetic range): 6.5 % (ref 0.0–7.0)

## 2020-01-23 MED ORDER — HYDROXYZINE HCL 25 MG PO TABS: 25.0000 mg | ORAL_TABLET | Freq: Three times a day (TID) | ORAL | 3 refills | Status: DC | PRN

## 2020-01-23 NOTE — Patient Instructions (Signed)
Steps to Quit Smoking Smoking tobacco is the leading cause of preventable death. It can affect almost every organ in the body. Smoking puts you and people around you at risk for many serious, long-lasting (chronic) diseases. Quitting smoking can be hard, but it is one of the best things that you can do for your health. It is never too late to quit. How do I get ready to quit? When you decide to quit smoking, make a plan to help you succeed. Before you quit:  Pick a date to quit. Set a date within the next 2 weeks to give you time to prepare.  Write down the reasons why you are quitting. Keep this list in places where you will see it often.  Tell your family, friends, and co-workers that you are quitting. Their support is important.  Talk with your doctor about the choices that may help you quit.  Find out if your health insurance will pay for these treatments.  Know the people, places, things, and activities that make you want to smoke (triggers). Avoid them. What first steps can I take to quit smoking?  Throw away all cigarettes at home, at work, and in your car.  Throw away the things that you use when you smoke, such as ashtrays and lighters.  Clean your car. Make sure to empty the ashtray.  Clean your home, including curtains and carpets. What can I do to help me quit smoking? Talk with your doctor about taking medicines and seeing a counselor at the same time. You are more likely to succeed when you do both.  If you are pregnant or breastfeeding, talk with your doctor about counseling or other ways to quit smoking. Do not take medicine to help you quit smoking unless your doctor tells you to do so. To quit smoking: Quit right away  Quit smoking totally, instead of slowly cutting back on how much you smoke over a period of time.  Go to counseling. You are more likely to quit if you go to counseling sessions regularly. Take medicine You may take medicines to help you quit. Some  medicines need a prescription, and some you can buy over-the-counter. Some medicines may contain a drug called nicotine to replace the nicotine in cigarettes. Medicines may:  Help you to stop having the desire to smoke (cravings).  Help to stop the problems that come when you stop smoking (withdrawal symptoms). Your doctor may ask you to use:  Nicotine patches, gum, or lozenges.  Nicotine inhalers or sprays.  Non-nicotine medicine that is taken by mouth. Find resources Find resources and other ways to help you quit smoking and remain smoke-free after you quit. These resources are most helpful when you use them often. They include:  Online chats with a counselor.  Phone quitlines.  Printed self-help materials.  Support groups or group counseling.  Text messaging programs.  Mobile phone apps. Use apps on your mobile phone or tablet that can help you stick to your quit plan. There are many free apps for mobile phones and tablets as well as websites. Examples include Quit Guide from the CDC and smokefree.gov  What things can I do to make it easier to quit?   Talk to your family and friends. Ask them to support and encourage you.  Call a phone quitline (1-800-QUIT-NOW), reach out to support groups, or work with a counselor.  Ask people who smoke to not smoke around you.  Avoid places that make you want to smoke,   such as: ? Bars. ? Parties. ? Smoke-break areas at work.  Spend time with people who do not smoke.  Lower the stress in your life. Stress can make you want to smoke. Try these things to help your stress: ? Getting regular exercise. ? Doing deep-breathing exercises. ? Doing yoga. ? Meditating. ? Doing a body scan. To do this, close your eyes, focus on one area of your body at a time from head to toe. Notice which parts of your body are tense. Try to relax the muscles in those areas. How will I feel when I quit smoking? Day 1 to 3 weeks Within the first 24 hours,  you may start to have some problems that come from quitting tobacco. These problems are very bad 2-3 days after you quit, but they do not often last for more than 2-3 weeks. You may get these symptoms:  Mood swings.  Feeling restless, nervous, angry, or annoyed.  Trouble concentrating.  Dizziness.  Strong desire for high-sugar foods and nicotine.  Weight gain.  Trouble pooping (constipation).  Feeling like you may vomit (nausea).  Coughing or a sore throat.  Changes in how the medicines that you take for other issues work in your body.  Depression.  Trouble sleeping (insomnia). Week 3 and afterward After the first 2-3 weeks of quitting, you may start to notice more positive results, such as:  Better sense of smell and taste.  Less coughing and sore throat.  Slower heart rate.  Lower blood pressure.  Clearer skin.  Better breathing.  Fewer sick days. Quitting smoking can be hard. Do not give up if you fail the first time. Some people need to try a few times before they succeed. Do your best to stick to your quit plan, and talk with your doctor if you have any questions or concerns. Summary  Smoking tobacco is the leading cause of preventable death. Quitting smoking can be hard, but it is one of the best things that you can do for your health.  When you decide to quit smoking, make a plan to help you succeed.  Quit smoking right away, not slowly over a period of time.  When you start quitting, seek help from your doctor, family, or friends. This information is not intended to replace advice given to you by your health care provider. Make sure you discuss any questions you have with your health care provider. Document Revised: 03/08/2019 Document Reviewed: 09/01/2018 Elsevier Patient Education  2020 Elsevier Inc.  

## 2020-01-23 NOTE — Progress Notes (Signed)
Subjective:  Patient ID: [name] Slade Sr., male    DOB: 06-20-1961  Age: 59 y.o. MRN: [id number]  CC: Diabetes   HPI [name] Hann Sr. is a 59year old male with a medical history of of type 2 diabetes mellitus (A1c 6.5), Seizures, chronic kidney disease stage III-IV, free biliary glomerulonephritis,, alcohol abuse, hypertension, alcohol abuse, coronary artery calcifications, ACDF of C4,5 and C5,6 on 09/14/17 By Dr Saintclair Halsted secondary to Myelopathy seenfor a follow-up visit.  His blood pressure is 94/60 today and he denies presence of dizziness.  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  Seizure - no recent seizure. He does have residual tremors from a previous trauma when a steel beam fell on his head, doing well on Keppra.  Diabetes is diet controlled with no complaints of hypoglycemia.  He does have slight neuropathy but this is not bothersome.  Previously on gabapentin but was unable to tolerate due to pedal edema.  He is yet to have an annual eye exam by an ophthalmologist. Past Medical History:  Diagnosis Date  . Anxiety   . CAD (coronary artery disease) 10/06/2017  . COPD (chronic obstructive pulmonary disease) ([place]) 09/29/2017  . Cough   . Depression   . Dyspnea   . 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 Hypertension 09/29/2017  . 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 Myelopathy ([place]) 09/14/2017  . Tobacco abuse 09/29/2017  . Type 2 diabetes mellitus ([place])  10/17/2011    Past Surgical History:  Procedure Laterality Date  . ANTERIOR CERVICAL DECOMP/DISCECTOMY FUSION N/A 09/14/2017   Procedure: ANTERIOR CERVICAL DECOMPRESSION/DISCECTOMY FUSIONCERVICAL 4- CERVICAL 5, CERVICAL 5- CERVICAL 6;  Surgeon: Kary Kos, MD;  Location: [facility];  Service: Neurosurgery;  Laterality: N/A;  ANTERIOR CERVICAL DECOMPRESSION/DISCECTOMY FUSIONCERVICAL 4- CERVICAL 5, CERVICAL 5- CERVICAL 6    Family History  Problem Relation Age of Onset  . Diabetes Mother   . Scoliosis Mother   . Kidney cancer Father   . Lung cancer Father   . Hypertension Brother     No Known Allergies  Outpatient Medications Prior to Visit  Medication Sig Dispense Refill  . albuterol (VENTOLIN HFA) 108 (90 Base) MCG/ACT inhaler INHALE 2 PUFFS INTO THE LUNGS EVERY 6 (SIX) HOURS AS NEEDED FOR WHEEZING OR SHORTNESS OF BREATH. 18 g 6  . amLODipine (NORVASC) 5 MG tablet Take 5 mg by mouth daily.    . carvedilol (COREG) 3.125 MG tablet Take 3.125 mg by mouth 2 (two) times daily with a meal.    . cetirizine (ZYRTEC) 10 MG tablet Take 10 mg by mouth daily as needed for allergies.     . citalopram (CELEXA) 40 MG tablet Take 1 tablet (40 mg total) by mouth daily. 30 tablet 6  . cloNIDine (CATAPRES) 0.2 MG tablet Take 1 tablet (0.2 mg total) by mouth 2 (two) times daily. (Patient taking differently: Take 0.2 mg by mouth 3 (three) times daily. ) 180 tablet 3  . ezetimibe (ZETIA) 10 MG tablet Take 1 tablet (10 mg total) by mouth daily. 30 tablet 6  . Fluticasone-Salmeterol (ADVAIR  DISKUS) 250-50 MCG/DOSE AEPB INHALE 1 PUFF INTO THE LUNGS 2 (TWO) TIMES DAILY. 60 each 6  . lisinopril (ZESTRIL) 40 MG tablet Take 40 mg by mouth daily.    . rosuvastatin (CRESTOR) 10 MG tablet Take 1 tablet (10 mg total) by mouth daily. 90 tablet 3  . sodium zirconium cyclosilicate (LOKELMA) 10 g PACK packet Take 10 g by mouth daily. 30 packet 3  . thiamine 100 MG tablet Take 1 tablet (100 mg total) by mouth daily. 30 tablet 0   . vitamin B-12 1000 MCG tablet Take 1 tablet (1,000 mcg total) by mouth daily. 30 tablet 0  . hydrOXYzine (ATARAX/VISTARIL) 25 MG tablet Take 1 tablet (25 mg total) by mouth 3 (three) times daily as needed for anxiety. 90 tablet 3  . levETIRAcetam (KEPPRA) 500 MG tablet Take 1 tablet (500 mg total) by mouth 2 (two) times daily. 60 tablet 6   No facility-administered medications prior to visit.     ROS Review of Systems  Constitutional: Negative for activity change and appetite change.  HENT: Negative for sinus pressure and sore throat.   Eyes: Negative for visual disturbance.  Respiratory: Negative for cough, chest tightness and shortness of breath.   Cardiovascular: Negative for chest pain and leg swelling.  Gastrointestinal: Negative for abdominal distention, abdominal pain, constipation and diarrhea.  Endocrine: Negative.   Genitourinary: Negative for dysuria.  Musculoskeletal: Negative for joint swelling and myalgias.  Skin: Negative for rash.  Allergic/Immunologic: Negative.   Neurological: Negative for weakness, light-headedness and numbness.  Psychiatric/Behavioral: Negative for dysphoric mood and suicidal ideas.    Objective:  BP (!) 94/60   Pulse 51   Ht 6\' 2"  (1.88 m)   Wt (!) 222 lb (100.7 kg)   SpO2 97%   BMI 28.50 kg/m   BP/Weight 01/23/2020 10/22/2019 0/01/6760  Systolic BP 94 950 932  Diastolic BP 60 77 81  Wt. (Lbs) 222 222 225  BMI 28.5 28.5 28.89      Physical Exam Constitutional:      Appearance: He is well-developed.  Neck:     Vascular: No JVD.  Cardiovascular:     Rate and Rhythm: Normal rate.     Heart sounds: Normal heart sounds. No murmur heard.   Pulmonary:     Effort: Pulmonary effort is normal.     Breath sounds: Normal breath sounds. No wheezing or rales.  Chest:     Chest wall: No tenderness.  Abdominal:     General: Bowel sounds are normal. There is no distension.     Palpations: Abdomen is soft. There is no mass.      Tenderness: There is no abdominal tenderness.  Musculoskeletal:        General: Normal range of motion.     Right lower leg: No edema.     Left lower leg: No edema.  Neurological:     Mental Status: He is alert and oriented to person, place, and time.  Psychiatric:        Mood and Affect: Mood normal.     CMP Latest Ref Rng & Units 10/14/2019 10/10/2019 10/07/2019  Glucose 65 - 99 mg/dL 134(H) 136(H) 143(H)  BUN 8 - 27 mg/dL 34(H) 42(H) 35(H)  Creatinine 0.76 - 1.27 mg/dL 2.26(H) 2.72(H) 2.40(H)  Sodium 134 - 144 mmol/L 136 136 139  Potassium 3.5 - 5.2 mmol/L 5.7(H) 6.2(H) 5.6(H)  Chloride 96 - 106 mmol/L 106 107(H) 105  CO2 20 - 29 mmol/L 18(L) 16(L) 15(L)  Calcium 8.6 - 10.2 mg/dL 9.0 9.1 9.3  Total Protein 6.0 - 8.5 g/dL - 5.4(L) 5.9(L)  Total Bilirubin 0.0 - 1.2 mg/dL - 0.2 0.3  Alkaline Phos 39 - 117 IU/L - 61 69  AST 0 - 40 IU/L - 13 18  ALT 0 - 44 IU/L - 14 15    Lipid Panel     Component Value Date/Time   CHOL 223 (H) 10/10/2019 1101   TRIG 105 10/10/2019 1101   HDL 63 10/10/2019 1101   CHOLHDL 3.5 10/10/2019 1101   LDLCALC 141 (H) 10/10/2019 1101    CBC    Component Value Date/Time   WBC 10.4 03/11/2019 0720   RBC 4.90 03/11/2019 0720   HGB 15.8 03/11/2019 0720   HCT 46.9 03/11/2019 0720   PLT 278 03/11/2019 0720   MCV 95.7 03/11/2019 0720   MCH 32.2 03/11/2019 0720   MCHC 33.7 03/11/2019 0720   RDW 12.2 03/11/2019 0720   LYMPHSABS 2.4 10/17/2017 1735   MONOABS 1.2 (H) 10/17/2017 1735   EOSABS 0.2 10/17/2017 1735   BASOSABS 0.0 10/17/2017 1735    Lab Results  Component Value Date   HGBA1C 6.5 01/23/2020    Assessment & Plan:  1. Type 2 diabetes mellitus with stage 4 chronic kidney disease, without long-term current use of insulin (HCC) Diet controlled with A1c of 6.5 Counseled on Diabetic diet, my plate method, 779 minutes of moderate intensity exercise/week Blood sugar logs with fasting goals of 80-120 mg/dl, random of less than 180 and in the  event of sugars less than 60 mg/dl or greater than 400 mg/dl encouraged to notify the clinic. Advised on the need for annual eye exams, annual foot exams, Pneumonia vaccine. - POCT glycosylated hemoglobin (Hb A1C) - Ambulatory referral to Ophthalmology  2. Anxiety and depression Controlled - hydrOXYzine (ATARAX/VISTARIL) 25 MG tablet; Take 1 tablet (25 mg total) by mouth 3 (three) times daily as needed for anxiety.  Dispense: 90 tablet; Refill: 3  3. Seizure ([place]) No recent seizures Continue Keppra  4. Essential hypertension Blood pressure is soft Advised to decrease clonidine to 0.2 mg twice daily down from 0.2 mg 3 times daily He will keep a check of his blood pressures at home and contact the clinic if his blood pressure starts to trend up  5. Tobacco abuse Spent 3 minutes counseling on smoking cessation and detrimental effects of smoking but he is not ready to quit at this time He has cut back on his smoking  6. Alcohol abuse Also encouraged to quit alcohol-he states he has cut back  7. Screening for colon cancer Colonoscopy- Cologuard  8. Fibrillary glomerulonephritis Currently on IV Rituximab Followed by [facility]   Health Care Maintenance: See #1 above Meds ordered this encounter  Medications  . hydrOXYzine (ATARAX/VISTARIL) 25 MG tablet    Sig: Take 1 tablet (25 mg total) by mouth 3 (three) times daily as needed for anxiety.    Dispense:  90 tablet    Refill:  3    Follow-up: Return in about 3 months (around 04/24/2020) for Chronic disease management.       Charlott Rakes, MD, FAAFP. [name] [place], [place]   01/23/2020, 12:40 PM

## 2020-01-23 NOTE — Progress Notes (Signed)
Needs refills on medications.  States that he is taking [place] for potassium.

## 2020-02-19 LAB — COLOGUARD: Cologuard: POSITIVE — AB

## 2020-02-20 ENCOUNTER — Telehealth: Payer: Self-pay | Admitting: Family Medicine

## 2020-02-20 NOTE — Telephone Encounter (Signed)
Sherrill with [employer] is calling to see if a fax was received for an abnormal cologuard. Please advise CB- [phone] Provider support Case number [id number]

## 2020-02-21 ENCOUNTER — Other Ambulatory Visit: Payer: Self-pay | Admitting: Family Medicine

## 2020-02-21 ENCOUNTER — Other Ambulatory Visit: Payer: Self-pay

## 2020-02-21 DIAGNOSIS — R195 Other fecal abnormalities: Secondary | ICD-10-CM

## 2020-02-21 NOTE — Telephone Encounter (Signed)
Will route to PCP.  Fax has not been received at this time of message. Will look out for fax.

## 2020-02-24 ENCOUNTER — Encounter: Payer: Self-pay | Admitting: Nurse Practitioner

## 2020-02-24 NOTE — Telephone Encounter (Signed)
I received fax and ordered Colonoscopy. Paperwork is in your most recent batch to call the patient.

## 2020-02-24 NOTE — Telephone Encounter (Signed)
Patient was called and informed of positive results and referral being placed.

## 2020-03-17 LAB — HM DIABETES EYE EXAM

## 2020-03-31 NOTE — Progress Notes (Signed)
03/31/2020 Clinten Howk Sr. [id number] January 02, 1961   CHIEF COMPLAINT:  Positive Cologuard Test   HISTORY OF PRESENT ILLNESS:  Reinhardt Licausi Sr. is a 59 year old male with a past medical history of anxiety, depression, alcoholism, hypertension, coronary artery calcifications, CHF, COPD/emphysema, DM II, seizure disorder with a grand mal seizure in 2019 on Keppra, hyperkalemia, fibrillary glomerulonephritis treated with Rituximab with base line creatinine  level 2.26-2.72 followed by nephrologist Dr. Posey Pronto. He presents to our office today for further evaluation regarding a positive Cologuard Test completed 02/19/2020. He's never had a colonoscopy. Brother and sister with history of colon polyps. No family history of colon cancer. He is passing a normal formed brown BM every am after he drinks 2 cups of coffee. He takes [facility] for hyperkalemia which also facilitates easy BMs in the morning. He denies having any upper or lower abdominal pain. No GERD symptoms. COPD is stable. No CP or SOB. He is followed annually by cardiologist, Dr. Gerarda Fraction. Myocardial perfusion study performed 10/24/2017 with coronary artery calcification ejection fraction 50% low risk study no evidence of ischemia. Echo 09/2017: estimated ejection fraction of 55 to 60% normal left ventricular function.   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 He works with [employer]. He has one son and 3 daughters. He smokes cigarettes 1ppd x 63 years. History of alcoholism, previously drank "2 cases of beer daily" now drinks 2 to 3 beers daily. No drug use.   Family History:  Mother with history of DM and scoliosis. Father with history of kidney and lung cancer. Brother with history of hypertension.   No Known Allergies    Outpatient Encounter Medications as of 04/01/2020  Medication Sig   albuterol (VENTOLIN HFA) 108 (90 Base) MCG/ACT inhaler INHALE 2 PUFFS INTO THE LUNGS EVERY 6 (SIX) HOURS AS NEEDED FOR WHEEZING OR SHORTNESS OF BREATH.   amLODipine (NORVASC) 5 MG tablet Take 5 mg by mouth daily.   carvedilol (COREG) 3.125 MG tablet Take 3.125 mg by mouth 2 (two) times daily with a meal.   cetirizine (ZYRTEC) 10 MG tablet Take 10 mg by mouth daily as needed for allergies.    citalopram (CELEXA) 40 MG tablet Take 1 tablet (40 mg total) by mouth daily.   cloNIDine (CATAPRES) 0.2 MG tablet Take 1 tablet (0.2 mg total) by mouth 2 (two) times daily. (Patient taking differently: Take 0.2 mg by mouth 3 (three) times daily. )   ezetimibe (ZETIA) 10 MG tablet Take 1 tablet (10 mg total) by mouth daily.   Fluticasone-Salmeterol (ADVAIR DISKUS) 250-50 MCG/DOSE AEPB INHALE 1 PUFF INTO THE LUNGS 2 (TWO) TIMES DAILY.  hydrOXYzine (ATARAX/VISTARIL) 25 MG tablet Take 1 tablet (25 mg total) by mouth 3 (three) times daily as needed for anxiety.   levETIRAcetam (KEPPRA) 500 MG tablet Take 1 tablet (500 mg total) by mouth 2 (two) times daily.   lisinopril (ZESTRIL) 40 MG tablet Take 40 mg by mouth daily.   rosuvastatin (CRESTOR) 10 MG  tablet Take 1 tablet (10 mg total) by mouth daily.   sodium zirconium cyclosilicate (LOKELMA) 10 g PACK packet Take 10 g by mouth daily.   thiamine 100 MG tablet Take 1 tablet (100 mg total) by mouth daily.   vitamin B-12 1000 MCG tablet Take 1 tablet (1,000 mcg total) by mouth daily.   No facility-administered encounter medications on file as of 04/01/2020.     REVIEW OF SYSTEMS:  Gen: Denies fever, sweats or chills. No weight loss.  CV: Denies chest pain, palpitations or edema. Resp: Denies cough, shortness of breath of hemoptysis.  GI: See HPI.  GU : See HPI. No dysuria or hematuria.  MS: Denies joint pain, muscles aches or weakness. Derm: Denies rash, itchiness, skin lesions or unhealing ulcers. Psych: + anxiety and depression.  Heme: Denies bruising, bleeding. Neuro:  + headaches. + seizure history, no further grand mal seizures since being on Keppra 2019. Possibly had a mini seizure < 15 seconds 4 months ago when he awakened from sleep briefly disoriented and a little shaky. He stated he discussed this with his PCP, no further neuro eval was required, no recurrence of these symptoms since then.  Endo:  + DM II.    PHYSICAL EXAM: BP 100/72    Pulse (!) 56    Ht 6' 0.5" (1.842 m)    Wt 221 lb (100.2 kg)    BMI 29.56 kg/m   General: 59 year old male appears older than his stated age in no acute distress. Head: Normocephalic and atraumatic. Eyes:  Sclerae non-icteric, conjunctive pink. Ears: Normal auditory acuity. Mouth: Upper and lower dentures.  No ulcers or lesions.  Neck: Supple, no lymphadenopathy or thyromegaly.  Lungs: Clear bilaterally to auscultation without wheezes, crackles or rhonchi. Heart: Regular rate and rhythm. No murmur, rub or gallop appreciated.  Abdomen: Soft, nontender, non distended. No masses. No hepatosplenomegaly. Normoactive bowel sounds x 4 quadrants.  Rectal: Deferred.  Musculoskeletal: + back pain.  Skin: Warm and dry. No rash or lesions on  visible extremities. Extremities: No edema. Neurological: Alert oriented x 4, no focal deficits.  Psychological:  Alert and cooperative. Normal mood and affect.  ASSESSMENT AND PLAN:  54. 59 year old male with a + Cologuard Test. Positive Family history of colon polyps -Colonoscopy benefits and risks discussed including risk with sedation, risk of bleeding, perforation and infection  -Further evaluation to be determined after colonoscopy completed  2. History of fibrillary glomerulonephritis treated with Rituximab  -Request copy of most recent nephrology consult with Dr. Posey Pronto   3. History of coronary artery calcifications and CHF followed annually by cardiologist Dr. Gerarda Fraction  4. History of seizure disorder, stable on Keppra   5. DM II  6. COPD, stable. He does not require home oxygen.         CC:  Charlott Rakes, MD

## 2020-04-01 ENCOUNTER — Other Ambulatory Visit: Payer: Self-pay | Admitting: Nurse Practitioner

## 2020-04-01 ENCOUNTER — Encounter: Payer: Self-pay | Admitting: Nurse Practitioner

## 2020-04-01 ENCOUNTER — Ambulatory Visit (INDEPENDENT_AMBULATORY_CARE_PROVIDER_SITE_OTHER): Payer: Medicare Other | Admitting: Nurse Practitioner

## 2020-04-01 VITALS — BP 100/72 | HR 56 | Ht 72.5 in | Wt 221.0 lb

## 2020-04-01 DIAGNOSIS — R195 Other fecal abnormalities: Secondary | ICD-10-CM | POA: Diagnosis not present

## 2020-04-01 DIAGNOSIS — Z8371 Family history of colonic polyps: Secondary | ICD-10-CM

## 2020-04-01 DIAGNOSIS — J441 Chronic obstructive pulmonary disease with (acute) exacerbation: Secondary | ICD-10-CM

## 2020-04-01 DIAGNOSIS — I25708 Atherosclerosis of coronary artery bypass graft(s), unspecified, with other forms of angina pectoris: Secondary | ICD-10-CM

## 2020-04-01 DIAGNOSIS — Z83719 Family history of colon polyps, unspecified: Secondary | ICD-10-CM

## 2020-04-01 MED ORDER — SUPREP BOWEL PREP KIT 17.5-3.13-1.6 GM/177ML PO SOLN
1.0000 | ORAL | 0 refills | Status: DC
Start: 2020-04-01 — End: 2020-06-18

## 2020-04-01 NOTE — Patient Instructions (Addendum)
You have been scheduled for a colonoscopy. Please follow written instructions given to you at your visit today.  Please pick up your prep supplies at the pharmacy within the next 1-3 days. If you use inhalers (even only as needed), please bring them with you on the day of your procedure.   We have sent the following medications to your pharmacy for you to pick up at your convenience: Suprep   If you are age 61 or older, your body mass index should be between 23-30. Your Body mass index is 29.56 kg/m. If this is out of the aforementioned range listed, please consider follow up with your Primary Care Provider.  If you are age 94 or younger, your body mass index should be between 19-25. Your Body mass index is 29.56 kg/m. If this is out of the aformentioned range listed, please consider follow up with your Primary Care Provider.   Thank you for choosing me and [facility].  [place]

## 2020-04-16 NOTE — Progress Notes (Signed)
Reviewed and agree with documentation and assessment and plan. K. Veena Jerline Linzy , MD   

## 2020-04-27 ENCOUNTER — Encounter: Payer: Self-pay | Admitting: Family Medicine

## 2020-04-27 ENCOUNTER — Other Ambulatory Visit: Payer: Self-pay

## 2020-04-27 ENCOUNTER — Ambulatory Visit: Payer: Medicare Other | Attending: Family Medicine | Admitting: Family Medicine

## 2020-04-27 ENCOUNTER — Other Ambulatory Visit: Payer: Self-pay | Admitting: Family Medicine

## 2020-04-27 DIAGNOSIS — N059 Unspecified nephritic syndrome with unspecified morphologic changes: Secondary | ICD-10-CM

## 2020-04-27 DIAGNOSIS — I10 Essential (primary) hypertension: Secondary | ICD-10-CM | POA: Diagnosis not present

## 2020-04-27 DIAGNOSIS — N1832 Chronic kidney disease, stage 3b: Secondary | ICD-10-CM

## 2020-04-27 DIAGNOSIS — R569 Unspecified convulsions: Secondary | ICD-10-CM

## 2020-04-27 DIAGNOSIS — E1122 Type 2 diabetes mellitus with diabetic chronic kidney disease: Secondary | ICD-10-CM | POA: Diagnosis not present

## 2020-04-27 DIAGNOSIS — Z9114 Patient's other noncompliance with medication regimen: Secondary | ICD-10-CM

## 2020-04-27 DIAGNOSIS — J438 Other emphysema: Secondary | ICD-10-CM

## 2020-04-27 DIAGNOSIS — F419 Anxiety disorder, unspecified: Secondary | ICD-10-CM | POA: Diagnosis not present

## 2020-04-27 DIAGNOSIS — Z91148 Patient's other noncompliance with medication regimen for other reason: Secondary | ICD-10-CM

## 2020-04-27 DIAGNOSIS — F32A Depression, unspecified: Secondary | ICD-10-CM

## 2020-04-27 MED ORDER — FLUTICASONE-SALMETEROL 250-50 MCG/DOSE IN AEPB: INHALATION_SPRAY | RESPIRATORY_TRACT | 6 refills | Status: DC

## 2020-04-27 MED ORDER — LEVETIRACETAM 500 MG PO TABS: 500.0000 mg | ORAL_TABLET | Freq: Two times a day (BID) | ORAL | 6 refills | Status: DC

## 2020-04-27 MED ORDER — CITALOPRAM HYDROBROMIDE 40 MG PO TABS: 40.0000 mg | ORAL_TABLET | Freq: Every day | ORAL | 6 refills | Status: DC

## 2020-04-27 MED ORDER — HYDROXYZINE HCL 25 MG PO TABS: 25.0000 mg | ORAL_TABLET | Freq: Three times a day (TID) | ORAL | 3 refills | Status: DC | PRN

## 2020-04-27 MED ORDER — ALBUTEROL SULFATE HFA 108 (90 BASE) MCG/ACT IN AERS: INHALATION_SPRAY | RESPIRATORY_TRACT | 6 refills | Status: DC

## 2020-04-27 NOTE — Progress Notes (Signed)
Virtual Visit via Telephone Note  I connected with [name] Hickle Sr., on 04/27/2020 at 9:36 AM by telephone due to the COVID-19 pandemic and verified that I am speaking with the correct person using two identifiers.   Consent: I discussed the limitations, risks, security and privacy concerns of performing an evaluation and management service by telephone and the availability of in person appointments. I also discussed with the patient that there may be a patient responsible charge related to this service. The patient expressed understanding and agreed to proceed.   Location of Patient: Home  Location of Provider: Clinic   Persons participating in Telemedicine visit: [name] Mesa Sr. [name] [name]-CMA Dr. Margarita Rana     History of Present Illness: [name] Pokorski Sr. is a 59year old male with a medical history of of type 2 diabetes mellitus (A1c 6.5), Seizures, chronic kidney disease stage III-IV, Fibrillary glomerulonephritis,, alcohol abuse, hypertension, alcohol abuse, coronary artery calcifications, ACDF of C4,5 and C5,6 on 09/14/17 By Dr Saintclair Halsted secondary to Myelopathy seenfor a follow-up visit.   He has no part D Medicare and has been without medications. After receiving his check and paying his rent he has not much left. BP yesterday was 179/98 He has had no recent seizures. He is noticing slight pedal edema. Was scheduled to receive IV rituximab by [facility] however he has not heard from them and is wondering if this is related to his Medicare part D  Currently caring for his mother who has back issues and is status post back injections Past Medical History:  Diagnosis Date  . Anxiety   . CAD (coronary artery disease) 10/06/2017  . COPD (chronic obstructive pulmonary disease) ([place]) 09/29/2017  . Cough   . Depression   . Dyspnea   . 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 Hypertension 09/29/2017  . 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 Kidney disease   . Myelopathy ([place]) 09/14/2017  . Tobacco abuse 09/29/2017  . Type 2 diabetes mellitus ([place]) 10/17/2011   No Known Allergies  Current Outpatient Medications on File Prior to Visit  Medication Sig Dispense Refill  . albuterol (VENTOLIN HFA) 108 (90 Base) MCG/ACT inhaler INHALE 2 PUFFS INTO THE LUNGS EVERY 6 (SIX) HOURS AS NEEDED FOR WHEEZING OR SHORTNESS OF BREATH. 18 g 6  . amLODipine (NORVASC) 5 MG tablet Take 5 mg by mouth daily.    . carvedilol (COREG) 3.125 MG tablet Take 3.125 mg by mouth 2 (two) times daily with a meal.    . cetirizine (ZYRTEC) 10 MG tablet Take 10 mg by mouth daily as needed for allergies.     . citalopram (CELEXA) 40 MG tablet Take 1 tablet (40 mg total) by mouth daily. 30 tablet 6  . cloNIDine (CATAPRES) 0.2 MG tablet Take 1 tablet (0.2 mg total) by mouth 2 (two) times daily. (Patient taking differently: Take 0.2 mg by mouth 3 (three) times daily. ) 180 tablet 3  . ezetimibe (ZETIA) 10 MG tablet Take 1 tablet (10 mg total) by mouth daily. 30 tablet 6  . Fluticasone-Salmeterol (ADVAIR DISKUS) 250-50 MCG/DOSE AEPB INHALE 1 PUFF INTO THE LUNGS 2 (TWO) TIMES DAILY. 60 each 6  . hydrOXYzine (ATARAX/VISTARIL) 25 MG tablet Take 1 tablet (25 mg total) by mouth 3 (three) times daily as needed for anxiety. 90 tablet 3  . lisinopril (ZESTRIL) 40 MG tablet Take 40 mg by mouth daily.    . rosuvastatin (  CRESTOR) 10 MG tablet Take 1 tablet (10 mg total) by mouth daily. 90 tablet 3  . sodium zirconium cyclosilicate (LOKELMA) 10 g PACK packet Take 10 g by mouth daily. 30 packet 3  . thiamine 100 MG tablet Take 1 tablet (100 mg total) by mouth daily. 30 tablet 0  . vitamin B-12 1000 MCG tablet Take 1 tablet (1,000 mcg total) by mouth daily. 30 tablet 0  . levETIRAcetam (KEPPRA) 500 MG tablet Take 1 tablet (500 mg total) by mouth 2 (two) times daily. 60 tablet 6  . Na Sulfate-K Sulfate-Mg Sulf (SUPREP BOWEL  PREP KIT) 17.5-3.13-1.6 GM/177ML SOLN Take 1 kit by mouth as directed. For colonoscopy prep (Patient not taking: Reported on 04/27/2020) 354 mL 0   No current facility-administered medications on file prior to visit.    Observations/Objective: Awake, alert, oriented x3 Not in acute distress   CMP Latest Ref Rng & Units 10/14/2019 10/10/2019 10/07/2019  Glucose 65 - 99 mg/dL 190(E) 344(P) 328(I)  BUN 8 - 27 mg/dL 64(U) 69(S) 13(Q)  Creatinine 0.76 - 1.27 mg/dL 8.27(R) 9.85(D) 2.10(T)  Sodium 134 - 144 mmol/L 136 136 139  Potassium 3.5 - 5.2 mmol/L 5.7(H) 6.2(H) 5.6(H)  Chloride 96 - 106 mmol/L 106 107(H) 105  CO2 20 - 29 mmol/L 18(L) 16(L) 15(L)  Calcium 8.6 - 10.2 mg/dL 9.0 9.1 9.3  Total Protein 6.0 - 8.5 g/dL - 5.4(L) 5.9(L)  Total Bilirubin 0.0 - 1.2 mg/dL - 0.2 0.3  Alkaline Phos 39 - 117 IU/L - 61 69  AST 0 - 40 IU/L - 13 18  ALT 0 - 44 IU/L - 14 15    Lab Results  Component Value Date   HGBA1C 6.5 01/23/2020     Assessment and Plan: 1. Fibrillary glomerulonephritis Yet to receive another dose of IV rituximab He has been advised to call his nephrologist so this can be scheduled  2. Type 2 diabetes mellitus with stage 3b chronic kidney disease, without long-term current use of insulin (HCC) Controlled with A1c of 6.5 Continue current regimen I have spoken with the pharmacist who will assist in providing his medications at his discount rate given his lack of Medicare part D Counseled on Diabetic diet, my plate method, 432 minutes of moderate intensity exercise/week Blood sugar logs with fasting goals of 80-120 mg/dl, random of less than 045 and in the event of sugars less than 60 mg/dl or greater than 217 mg/dl encouraged to notify the clinic. Advised on the need for annual eye exams, annual foot exams, Pneumonia vaccine.  3. Essential hypertension Uncontrolled due to running out of medications See #2 regarding medication assistance  4. Anxiety and  depression Stable We will refill his medications and have him pick them up at our pharmacy - hydrOXYzine (ATARAX/VISTARIL) 25 MG tablet; Take 1 tablet (25 mg total) by mouth 3 (three) times daily as needed for anxiety.  Dispense: 90 tablet; Refill: 3 - citalopram (CELEXA) 40 MG tablet; Take 1 tablet (40 mg total) by mouth daily.  Dispense: 30 tablet; Refill: 6  5. Other emphysema (HCC) No exacerbations - albuterol (VENTOLIN HFA) 108 (90 Base) MCG/ACT inhaler; INHALE 2 PUFFS INTO THE LUNGS EVERY 6 (SIX) HOURS AS NEEDED FOR WHEEZING OR SHORTNESS OF BREATH.  Dispense: 18 g; Refill: 6 - Fluticasone-Salmeterol (ADVAIR DISKUS) 250-50 MCG/DOSE AEPB; INHALE 1 PUFF INTO THE LUNGS 2 (TWO) TIMES DAILY.  Dispense: 60 each; Refill: 6  6. Seizure (HCC) No recent seizures - levETIRAcetam (KEPPRA) 500 MG tablet; Take  1 tablet (500 mg total) by mouth 2 (two) times daily.  Dispense: 60 tablet; Refill: 6   Follow Up Instructions: 3 months for chronic disease management   I discussed the assessment and treatment plan with the patient. The patient was provided an opportunity to ask questions and all were answered. The patient agreed with the plan and demonstrated an understanding of the instructions.   The patient was advised to call back or seek an in-person evaluation if the symptoms worsen or if the condition fails to improve as anticipated.     I provided 16 minutes total of non-face-to-face time during this encounter including median intraservice time, reviewing previous notes, investigations, ordering medications, medical decision making, coordinating care and patient verbalized understanding at the end of the visit.     Charlott Rakes, MD, FAAFP. [facility] [place], [place]   04/27/2020, 9:36 AM

## 2020-04-27 NOTE — Progress Notes (Signed)
Does not have part D insurance and has not had any BP medications.  States that BP has been elevated.  Has been taking care of mother at home.

## 2020-04-28 ENCOUNTER — Telehealth: Payer: Self-pay | Admitting: Family Medicine

## 2020-04-28 NOTE — Telephone Encounter (Signed)
Patient was called and informed that medications are ready for pick up.

## 2020-04-28 NOTE — Telephone Encounter (Signed)
Copied from [place] [id number]. Topic: General - Other >> Apr 28, 2020 10:06 AM Oneta Rack wrote: Reason for CRM: patient states he is returning "Elmo Putt" call regarding his medications, patient would like a follow up call today. (bad connection on PEC Agent end)

## 2020-04-29 ENCOUNTER — Encounter: Payer: Medicare Other | Admitting: Gastroenterology

## 2020-05-02 IMAGING — US US BIOPSY
1 series · 12 of 12 positions shown · non-contrast
Comparison: Ultrasound 06/14/2018

CLINICAL DATA: Chronic kidney disease

EXAM:
ULTRASOUND GUIDED RENAL CORE BIOPSY
TECHNIQUE: The procedure, risks (including but not limited to bleeding,
infection, organ damage ), benefits, and alternatives were explained
to the patient. Questions regarding the procedure were encouraged
and answered. The patient understands and consents to the procedure.
Survey ultrasound was performed and an appropriate skin entry site
was localized. Site was marked, prepped with chlorhexidine, draped
in usual sterile fashion, infiltrated locally with 1% lidocaine.

[Series 1: us biopsy · 12 of 12 slices shown]
[im 1/12]
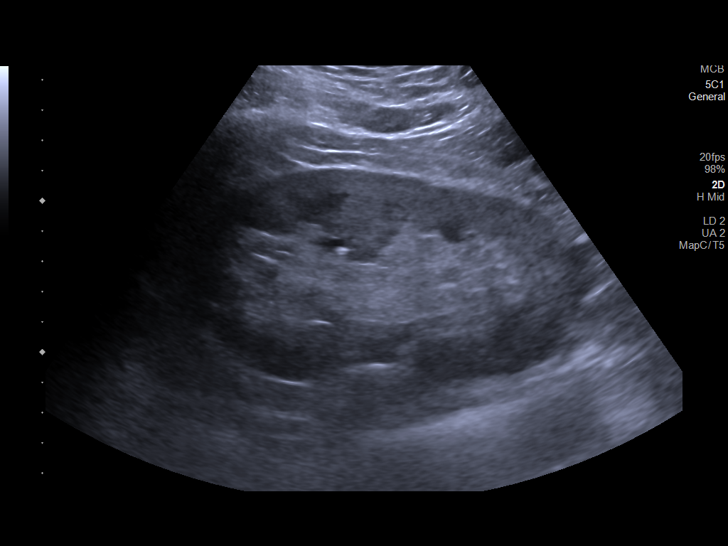
[im 2/12]
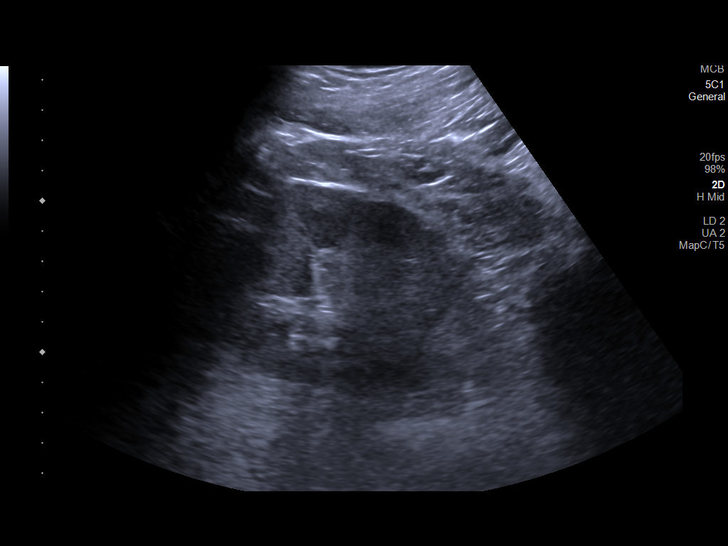
[im 3/12]
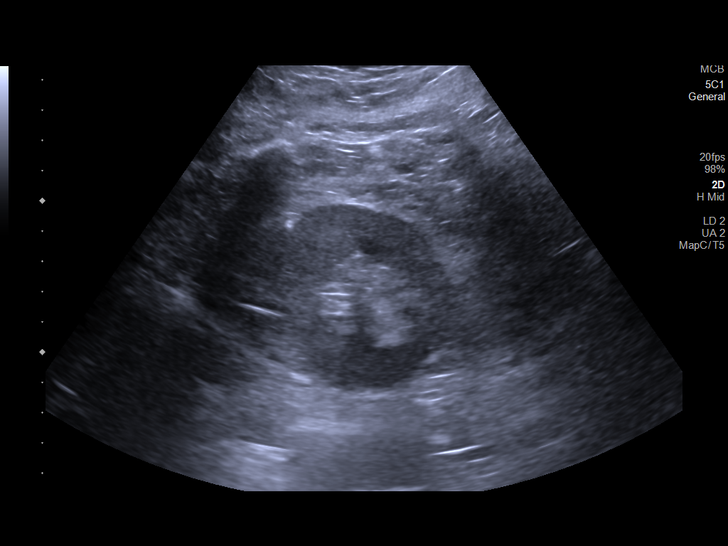
[im 4/12]
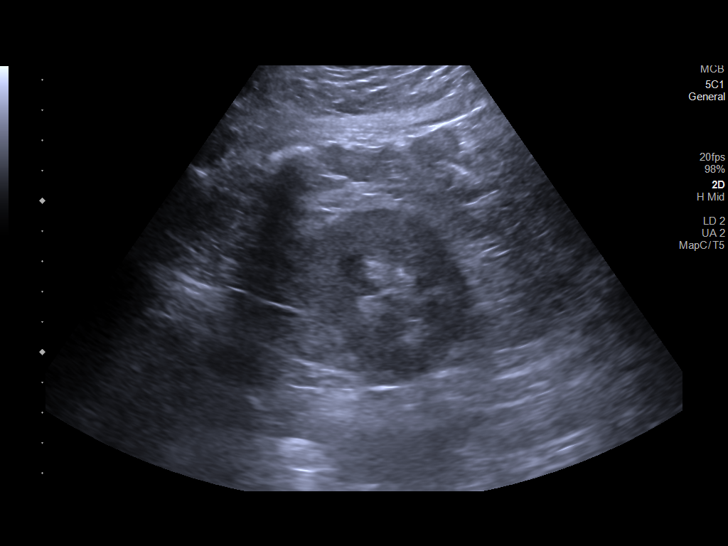
[im 5/12]
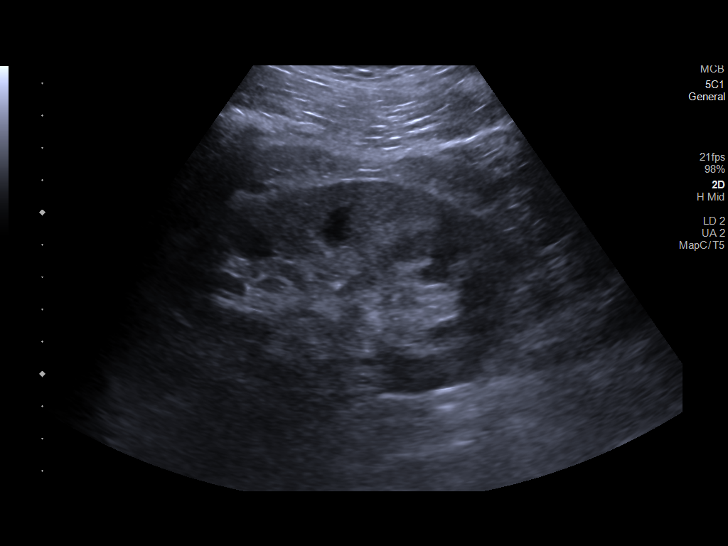
[im 6/12]
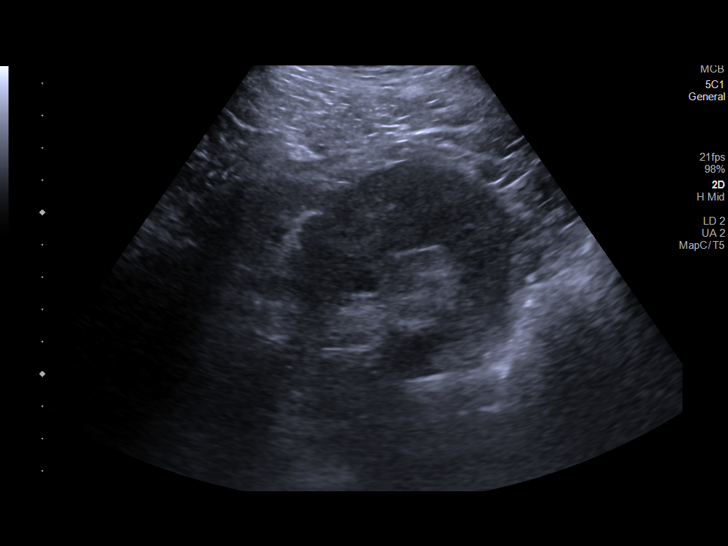
[im 7/12]
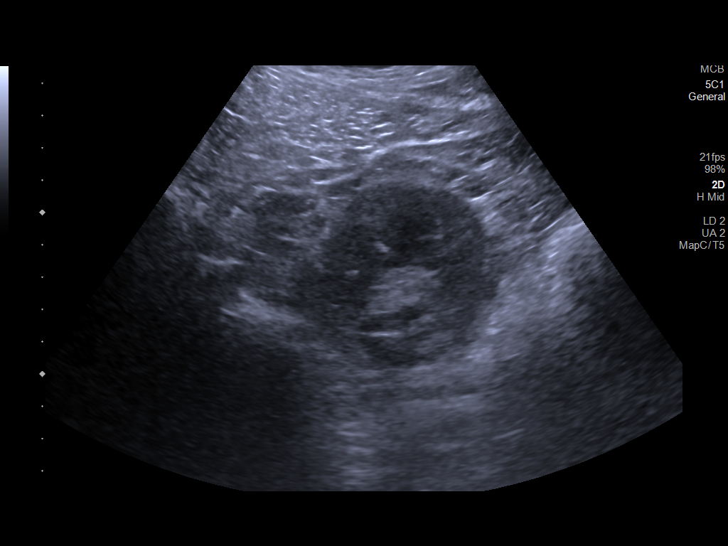
[im 8/12]
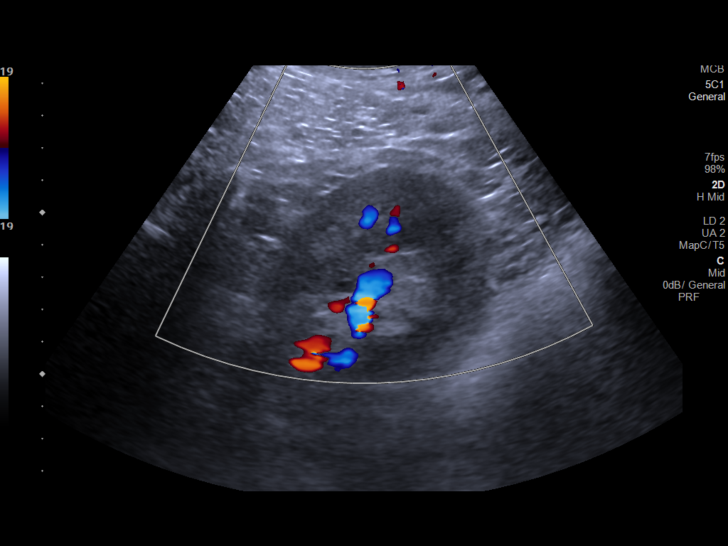
[im 9/12]
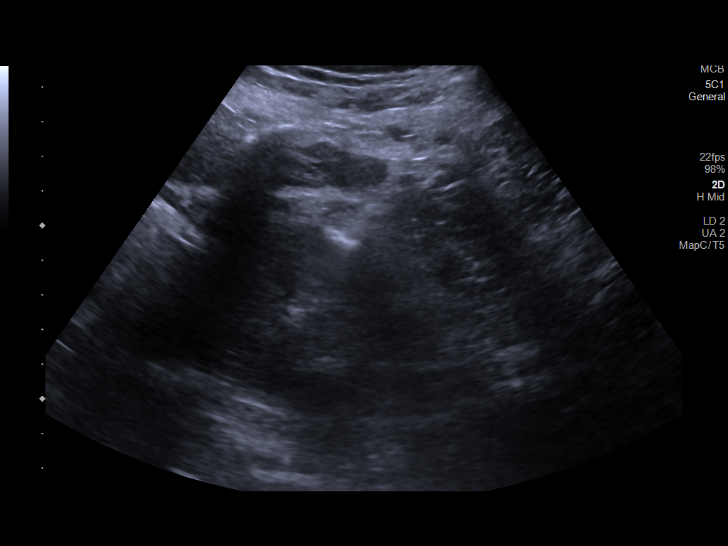
[im 10/12]
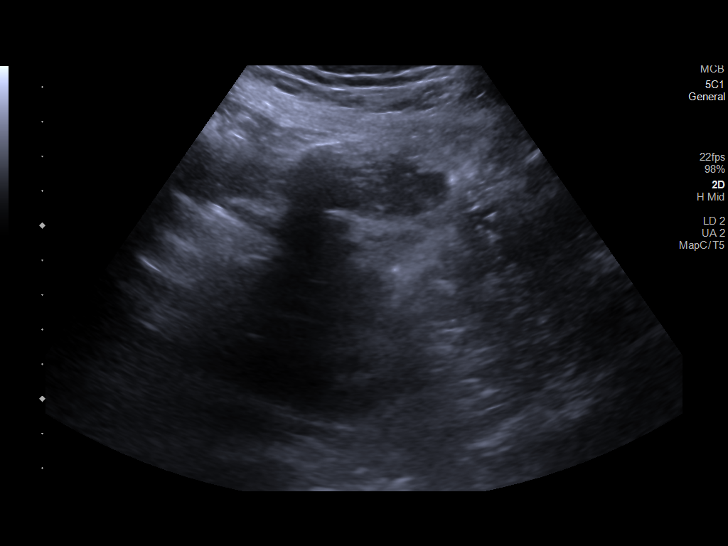
[im 11/12]
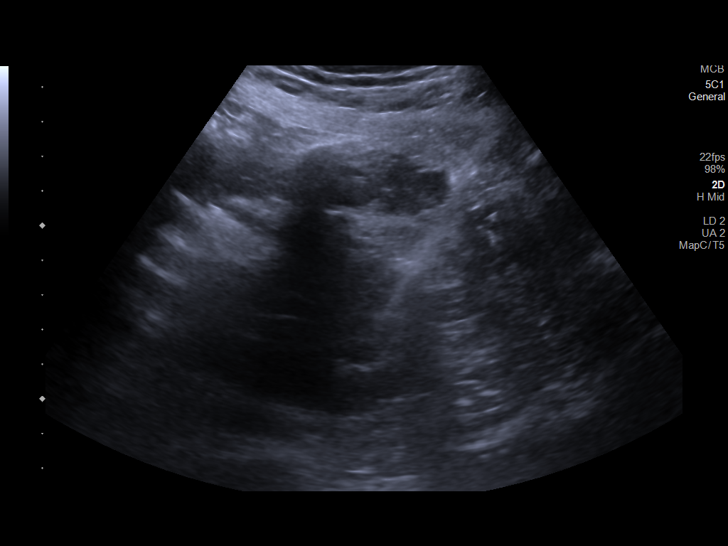
[im 12/12]
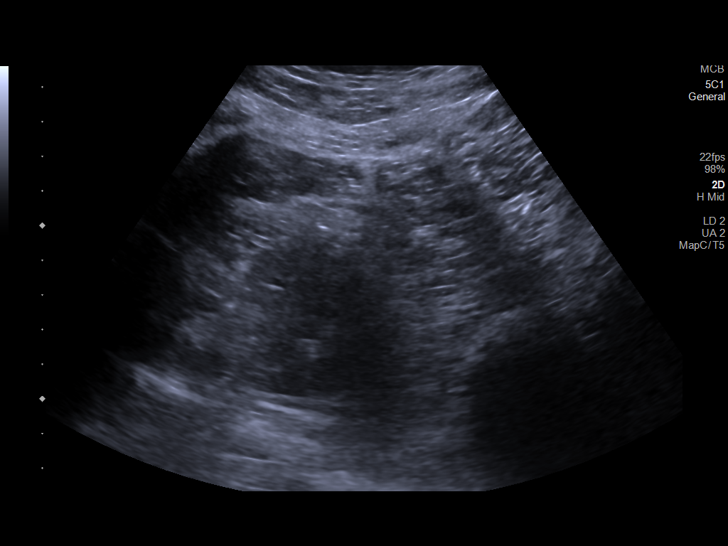

[12 of 12 positions shown; findings below may reference images not displayed]

Intravenous Fentanyl 111mcg and Versed 2mg were administered as
conscious sedation during continuous monitoring of the patient's
level of consciousness and physiological / cardiorespiratory status
by the radiology RN, with a total moderate sedation time of 10
minutes.

Under real time ultrasound guidance, a 15 gauge trocar needle was
advanced to the margin of the lower pole of the left kidney for 2
coaxial 16 gauge core biopsy needle passes. The core samples were
submitted to pathology. The patient tolerated procedure well.

COMPLICATIONS:
None immediate
IMPRESSION: 1. Technically successful ultrasound-guided core renal biopsy

## 2020-05-04 ENCOUNTER — Other Ambulatory Visit: Payer: Self-pay

## 2020-05-04 ENCOUNTER — Ambulatory Visit: Payer: Medicare Other | Attending: Family Medicine | Admitting: Licensed Clinical Social Worker

## 2020-05-04 DIAGNOSIS — Z599 Problem related to housing and economic circumstances, unspecified: Secondary | ICD-10-CM

## 2020-05-12 ENCOUNTER — Telehealth: Payer: Self-pay | Admitting: Gastroenterology

## 2020-05-12 NOTE — Telephone Encounter (Signed)
Hi Dr. Silverio Decamp, this pt just r/s his colonoscopy that was scheduled on 11/19 because he has to go to court. He has rescheduled to 12/23 at 2:00pm. Thank you.

## 2020-05-12 NOTE — Telephone Encounter (Signed)
ok 

## 2020-05-15 ENCOUNTER — Encounter: Payer: Medicare Other | Admitting: Gastroenterology

## 2020-05-16 NOTE — BH Specialist Note (Signed)
LCSW met with patient. Patient shared that he has been experiencing stress triggered by costs associated with obtaining prescriptions, stating confusion regarding coverage through medicaid.   LCSW confirmed with front desk staff that pt has active medicaid and medicare, that covers prescription costs. Pt was provided contact information for [employer] ([place]), to strengthen support regarding any additional medical coverage questions or concerns.   Pt was very appreciative for the assistance. No additional concerns noted.

## 2020-05-19 ENCOUNTER — Other Ambulatory Visit: Payer: Self-pay | Admitting: Cardiology

## 2020-06-09 ENCOUNTER — Other Ambulatory Visit (HOSPITAL_COMMUNITY): Payer: Self-pay | Admitting: *Deleted

## 2020-06-10 ENCOUNTER — Ambulatory Visit (HOSPITAL_COMMUNITY)
Admission: RE | Admit: 2020-06-10 | Discharge: 2020-06-10 | Disposition: A | Discharge status: Home / Self Care | Payer: Medicare Other | Source: Ambulatory Visit | Attending: Nephrology | Admitting: Nephrology

## 2020-06-10 ENCOUNTER — Other Ambulatory Visit: Payer: Self-pay

## 2020-06-10 DIAGNOSIS — N059 Unspecified nephritic syndrome with unspecified morphologic changes: Secondary | ICD-10-CM | POA: Diagnosis present

## 2020-06-10 MED ORDER — DIPHENHYDRAMINE HCL 50 MG/ML IJ SOLN: 25.0000 mg | Freq: Once | INTRAMUSCULAR | Status: DC

## 2020-06-10 MED ORDER — ACETAMINOPHEN 325 MG PO TABS: 650.0000 mg | ORAL_TABLET | Freq: Once | ORAL | Status: DC

## 2020-06-10 MED ORDER — DIPHENHYDRAMINE HCL 50 MG/ML IJ SOLN
INTRAMUSCULAR | Status: AC
  Administered 2020-06-10: 25 mg
  Filled 2020-06-10: qty 1

## 2020-06-10 MED ORDER — SODIUM CHLORIDE 0.9 % IV SOLN
1000.0000 mg | Freq: Once | INTRAVENOUS | Status: AC
  Administered 2020-06-10: 1000 mg via INTRAVENOUS
  Filled 2020-06-10: qty 50

## 2020-06-10 MED ORDER — METHYLPREDNISOLONE SODIUM SUCC 125 MG IJ SOLR: 125.0000 mg | Freq: Once | INTRAMUSCULAR | Status: DC

## 2020-06-10 MED ORDER — METHYLPREDNISOLONE SODIUM SUCC 125 MG IJ SOLR
INTRAMUSCULAR | Status: AC
  Administered 2020-06-10: 125 mg
  Filled 2020-06-10: qty 2

## 2020-06-10 MED ORDER — ACETAMINOPHEN 325 MG PO TABS
ORAL_TABLET | ORAL | Status: AC
  Administered 2020-06-10: 650 mg
  Filled 2020-06-10: qty 2

## 2020-06-18 ENCOUNTER — Encounter: Payer: Self-pay | Admitting: Gastroenterology

## 2020-06-18 ENCOUNTER — Ambulatory Visit (AMBULATORY_SURGERY_CENTER): Payer: Medicare Other | Admitting: Gastroenterology

## 2020-06-18 ENCOUNTER — Other Ambulatory Visit: Payer: Self-pay

## 2020-06-18 VITALS — BP 150/89 | HR 64 | Temp 97.3°F | Resp 16 | Ht 72.5 in | Wt 221.0 lb

## 2020-06-18 DIAGNOSIS — K635 Polyp of colon: Secondary | ICD-10-CM

## 2020-06-18 DIAGNOSIS — D128 Benign neoplasm of rectum: Secondary | ICD-10-CM

## 2020-06-18 DIAGNOSIS — D125 Benign neoplasm of sigmoid colon: Secondary | ICD-10-CM

## 2020-06-18 DIAGNOSIS — K621 Rectal polyp: Secondary | ICD-10-CM

## 2020-06-18 DIAGNOSIS — K5730 Diverticulosis of large intestine without perforation or abscess without bleeding: Secondary | ICD-10-CM

## 2020-06-18 DIAGNOSIS — K648 Other hemorrhoids: Secondary | ICD-10-CM | POA: Diagnosis not present

## 2020-06-18 DIAGNOSIS — D122 Benign neoplasm of ascending colon: Secondary | ICD-10-CM

## 2020-06-18 DIAGNOSIS — R195 Other fecal abnormalities: Secondary | ICD-10-CM

## 2020-06-18 MED ORDER — SODIUM CHLORIDE 0.9 % IV SOLN: 500.0000 mL | Freq: Once | INTRAVENOUS | Status: DC

## 2020-06-18 NOTE — Patient Instructions (Signed)
YOU HAD AN ENDOSCOPIC PROCEDURE TODAY AT THE [facility]:   Refer to the procedure report that was given to you for any specific questions about what was found during the examination.  If the procedure report does not answer your questions, please call your gastroenterologist to clarify.  If you requested that your care partner not be given the details of your procedure findings, then the procedure report has been included in a sealed envelope for you to review at your convenience later.  YOU SHOULD EXPECT: Some feelings of bloating in the abdomen. Passage of more gas than usual.  Walking can help get rid of the air that was put into your GI tract during the procedure and reduce the bloating. If you had a lower endoscopy (such as a colonoscopy or flexible sigmoidoscopy) you may notice spotting of blood in your stool or on the toilet paper. If you underwent a bowel prep for your procedure, you may not have a normal bowel movement for a few days.  Please Note:  You might notice some irritation and congestion in your nose or some drainage.  This is from the oxygen used during your procedure.  There is no need for concern and it should clear up in a day or so.  SYMPTOMS TO REPORT IMMEDIATELY:   Following lower endoscopy (colonoscopy or flexible sigmoidoscopy):  Excessive amounts of blood in the stool  Significant tenderness or worsening of abdominal pains  Swelling of the abdomen that is new, acute  Fever of 100F or higher  For urgent or emergent issues, a gastroenterologist can be reached at any hour by calling [phone]. Do not use MyChart messaging for urgent concerns.    DIET:  We do recommend a small meal at first, but then you may proceed to your regular diet.  Drink plenty of fluids but you should avoid alcoholic beverages for 24 hours.  ACTIVITY:  You should plan to take it easy for the rest of today and you should NOT DRIVE or use heavy machinery until tomorrow (because  of the sedation medicines used during the test).    FOLLOW UP: Our staff will call the number listed on your records 48-72 hours following your procedure to check on you and address any questions or concerns that you may have regarding the information given to you following your procedure. If we do not reach you, we will leave a message.  We will attempt to reach you two times.  During this call, we will ask if you have developed any symptoms of COVID 19. If you develop any symptoms (ie: fever, flu-like symptoms, shortness of breath, cough etc.) before then, please call [phone].  If you test positive for Covid 19 in the 2 weeks post procedure, please call and report this information to us.    If any biopsies were taken you will be contacted by phone or by letter within the next 1-3 weeks.  Please call us at [phone] if you have not heard about the biopsies in 3 weeks.    SIGNATURES/CONFIDENTIALITY: You and/or your care partner have signed paperwork which will be entered into your electronic medical record.  These signatures attest to the fact that that the information above on your After Visit Summary has been reviewed and is understood.  Full responsibility of the confidentiality of this discharge information lies with you and/or your care-partner. 

## 2020-06-18 NOTE — Progress Notes (Signed)
Medical history reviewed with no changes noted. VS assessed by C.W 

## 2020-06-18 NOTE — Op Note (Signed)
[facility] Patient Name: [name] [name] Procedure Date: 06/18/2020 2:06 PM MRN: [id number] Endoscopist: Mauri Pole , MD Age: 59 Referring MD:  Date of Birth: 11/14/1960 Gender: Male Account #: [id number] Procedure:                Colonoscopy Indications:              Positive Cologuard test Medicines:                Monitored Anesthesia Care Procedure:                Pre-Anesthesia Assessment:                           - Prior to the procedure, a History and Physical                            was performed, and patient medications and                            allergies were reviewed. The patient's tolerance of                            previous anesthesia was also reviewed. The risks                            and benefits of the procedure and the sedation                            options and risks were discussed with the patient.                            All questions were answered, and informed consent                            was obtained. Prior Anticoagulants: The patient has                            taken no previous anticoagulant or antiplatelet                            agents. ASA Grade Assessment: III - A patient with                            severe systemic disease. After reviewing the risks                            and benefits, the patient was deemed in                            satisfactory condition to undergo the procedure.                           After obtaining informed consent, the colonoscope  was passed under direct vision. Throughout the                            procedure, the patient's blood pressure, pulse, and                            oxygen saturations were monitored continuously. The                            Olympus [id number]-[id number] (#[id number]) Colonoscope was                            introduced through the anus and advanced to the the                            cecum, identified by the  appendiceal orifice, IC                            valve and transillumination. The colonoscopy was                            performed without difficulty. The patient tolerated                            the procedure well. The quality of the bowel                            preparation was good. The ileocecal valve,                            appendiceal orifice, and rectum were photographed. Scope In: 2:14:18 PM Scope Out: 2:31:51 PM Scope Withdrawal Time: 0 hours 11 minutes 40 seconds  Total Procedure Duration: 0 hours 17 minutes 33 seconds  Findings:                 The perianal and digital rectal examinations were                            normal.                           A 12 mm polyp was found in the ascending colon. The                            polyp was sessile. The polyp was removed with a                            cold snare. Resection and retrieval were complete.                           Two semi-pedunculated polyps were found in the                            rectum and sigmoid colon. The polyps were 10 to  14                            mm in size. These polyps were removed with a hot                            snare. Resection and retrieval were complete.                           Scattered small and large-mouthed diverticula were                            found in the sigmoid colon.                           Non-bleeding external and internal hemorrhoids were                            found during retroflexion. The hemorrhoids were                            medium-sized. Complications:            No immediate complications. Estimated Blood Loss:     Estimated blood loss was minimal. Impression:               - One 12 mm polyp in the ascending colon, removed                            with a cold snare. Resected and retrieved.                           - Two 10 to 14 mm polyps in the rectum and in the                            sigmoid colon, removed with a hot  snare. Resected                            and retrieved.                           - Diverticulosis in the sigmoid colon.                           - Non-bleeding external and internal hemorrhoids. Recommendation:           - Patient has a contact number available for                            emergencies. The signs and symptoms of potential                            delayed complications were discussed with the                            patient. Return to normal activities  tomorrow.                            Written discharge instructions were provided to the                            patient.                           - Resume previous diet.                           - Continue present medications.                           - Await pathology results.                           - Repeat colonoscopy in 3 years for surveillance                            based on pathology results. Mauri Pole, MD 06/18/2020 2:36:03 PM This report has been signed electronically.

## 2020-06-18 NOTE — Progress Notes (Signed)
Report to PACU, RN, vss, BBS= Clear.  

## 2020-06-22 ENCOUNTER — Telehealth: Payer: Self-pay

## 2020-06-22 NOTE — Telephone Encounter (Signed)
LVM

## 2020-06-30 ENCOUNTER — Other Ambulatory Visit: Payer: Medicare Other | Admitting: Family Medicine

## 2020-06-30 DIAGNOSIS — J438 Other emphysema: Secondary | ICD-10-CM

## 2020-06-30 DIAGNOSIS — F419 Anxiety disorder, unspecified: Secondary | ICD-10-CM

## 2020-06-30 DIAGNOSIS — F32A Depression, unspecified: Secondary | ICD-10-CM

## 2020-06-30 NOTE — Telephone Encounter (Signed)
Notes to clinic:   medication filled by a different provider Review for refills  Requested Prescriptions  Pending Prescriptions Disp Refills   cloNIDine (CATAPRES) 0.2 MG tablet 180 tablet 3    Sig: Take 1 tablet (0.2 mg total) by mouth 2 (two) times daily.      Cardiovascular:  Alpha-2 Agonists Failed - 06/30/2020  2:26 PM      Failed - Last BP in normal range    BP Readings from Last 1 Encounters:  06/18/20 (!) 150/89          Passed - Last Heart Rate in normal range    Pulse Readings from Last 1 Encounters:  06/18/20 64          Passed - Valid encounter within last 6 months    Recent Outpatient Visits           2 months ago Fibrillary glomerulonephritis   [place], [place], MD   5 months ago Type 2 diabetes mellitus with stage 4 chronic kidney disease, without long-term current use of insulin ([place])   [place], [place], MD   8 months ago Type 2 diabetes mellitus with stage 3b chronic kidney disease, without long-term current use of insulin ([place])   [facility] [place], Charlane Ferretti, MD   1 year ago Neuropathy   [place], Charlane Ferretti, MD   1 year ago Stage 3 chronic kidney disease [facility])   [place], Charlane Ferretti, MD                  sodium zirconium cyclosilicate (LOKELMA) 10 g PACK packet 30 packet 3    Sig: Take 10 g by mouth daily.      Off-Protocol Failed - 06/30/2020  2:26 PM      Failed - Medication not assigned to a protocol, review manually.      Passed - Valid encounter within last 12 months    Recent Outpatient Visits           2 months ago Fibrillary glomerulonephritis   [place], [place], MD   5 months ago Type 2 diabetes mellitus with stage 4 chronic kidney disease, without long-term current use of insulin ([place])   [place], [place], MD   8 months ago Type 2 diabetes mellitus with stage 3b chronic kidney disease, without long-term current use of insulin ([place])   [facility] [place], Charlane Ferretti, MD   1 year ago Neuropathy   [place], Charlane Ferretti, MD   1 year ago Stage 3 chronic kidney disease [facility])   [place], Charlane Ferretti, MD                  amLODipine (NORVASC) 5 MG tablet      Sig: Take 1 tablet (5 mg total) by mouth daily.      Cardiovascular:  Calcium Channel Blockers Failed - 06/30/2020  2:26 PM      Failed - Last BP in normal range    BP Readings from Last 1 Encounters:  06/18/20 (!) 150/89          Passed - Valid encounter within last 6 months    Recent Outpatient Visits           2 months  ago Fibrillary glomerulonephritis   [place], [place], MD   5 months ago Type 2 diabetes mellitus with stage 4 chronic kidney disease, without long-term current use of insulin ([place])   [place], [place], MD   8 months ago Type 2 diabetes mellitus with stage 3b chronic kidney disease, without long-term current use of insulin ([place])   [facility] Charlott Rakes, MD   1 year ago Neuropathy   [place], [place], MD   1 year ago Stage 3 chronic kidney disease [facility])   [facility] Charlott Rakes, MD

## 2020-06-30 NOTE — Telephone Encounter (Signed)
Medication Refill - Medication: cloNIDine (CATAPRES) 0.2 MG tablet,citalopram (CELEXA) 40 MG tablet,amLODipine (NORVASC) 5 MG tablet,Fluticasone-Salmeterol (ADVAIR DISKUS) 250-50 MCG/DOSE AEPB,sodium zirconium cyclosilicate (LOKELMA) 10 g PACK packet  Has the patient contacted their pharmacy?Yes (Agent: If no, request that the patient contact the pharmacy for the refill.) (Agent: If yes, when and what did the pharmacy advise?)Contact PCP  Preferred Pharmacy (with phone number or street name):  [place] Phone:  [phone]  Fax:  [phone]       Agent: Please be advised that RX refills may take up to 3 business days. We ask that you follow-up with your pharmacy.

## 2020-07-03 ENCOUNTER — Other Ambulatory Visit: Payer: Self-pay | Admitting: Family Medicine

## 2020-07-03 MED ORDER — AMLODIPINE BESYLATE 5 MG PO TABS: 5.0000 mg | ORAL_TABLET | Freq: Every day | ORAL | 2 refills | Status: DC

## 2020-07-03 MED ORDER — CLONIDINE HCL 0.2 MG PO TABS: 0.2000 mg | ORAL_TABLET | Freq: Two times a day (BID) | ORAL | 2 refills | Status: DC

## 2020-07-14 ENCOUNTER — Encounter: Payer: Self-pay | Admitting: Gastroenterology

## 2020-08-24 ENCOUNTER — Other Ambulatory Visit: Payer: Self-pay | Admitting: Pharmacy Technician

## 2020-08-24 ENCOUNTER — Other Ambulatory Visit: Payer: Self-pay | Admitting: Nephrology

## 2020-08-28 ENCOUNTER — Other Ambulatory Visit: Payer: Self-pay | Admitting: Nephrology

## 2020-08-31 ENCOUNTER — Other Ambulatory Visit: Payer: Self-pay | Admitting: Cardiology

## 2020-09-26 ENCOUNTER — Other Ambulatory Visit: Payer: Self-pay

## 2020-09-28 ENCOUNTER — Other Ambulatory Visit: Payer: Self-pay

## 2020-09-28 MED FILL — Clonidine HCl Tab 0.2 MG: ORAL | 30 days supply | Qty: 60 | Fill #0 | Status: AC

## 2020-09-28 MED FILL — Hydroxyzine HCl Tab 25 MG: ORAL | 30 days supply | Qty: 90 | Fill #0 | Status: AC

## 2020-09-28 MED FILL — Pregabalin Cap 25 MG: ORAL | 30 days supply | Qty: 30 | Fill #0 | Status: AC

## 2020-10-28 ENCOUNTER — Other Ambulatory Visit: Payer: Self-pay

## 2020-10-28 MED FILL — Sodium Zirconium Cyclosilicate For Susp Packet 10 GM: ORAL | 30 days supply | Qty: 30 | Fill #0 | Status: AC

## 2020-10-28 MED FILL — Pregabalin Cap 25 MG: ORAL | 30 days supply | Qty: 30 | Fill #1 | Status: AC

## 2020-10-28 MED FILL — Carvedilol Tab 3.125 MG: ORAL | 30 days supply | Qty: 60 | Fill #0 | Status: AC

## 2020-10-28 MED FILL — Clonidine HCl Tab 0.2 MG: ORAL | 30 days supply | Qty: 60 | Fill #1 | Status: AC

## 2020-10-29 ENCOUNTER — Other Ambulatory Visit: Payer: Self-pay

## 2020-10-29 MED FILL — Fluticasone-Salmeterol Aer Powder BA 250-50 MCG/ACT: RESPIRATORY_TRACT | 30 days supply | Qty: 60 | Fill #0 | Status: AC

## 2020-11-27 ENCOUNTER — Other Ambulatory Visit: Payer: Self-pay | Admitting: Family Medicine

## 2020-11-27 ENCOUNTER — Other Ambulatory Visit: Payer: Self-pay | Admitting: Cardiology

## 2020-11-27 ENCOUNTER — Other Ambulatory Visit: Payer: Self-pay

## 2020-11-27 DIAGNOSIS — E1169 Type 2 diabetes mellitus with other specified complication: Secondary | ICD-10-CM

## 2020-11-27 MED ORDER — ROSUVASTATIN CALCIUM 10 MG PO TABS
ORAL_TABLET | Freq: Every day | ORAL | 0 refills | Status: DC
  Filled 2020-11-27: qty 30, 30d supply, fill #0

## 2020-11-27 MED ORDER — EZETIMIBE 10 MG PO TABS
ORAL_TABLET | Freq: Every day | ORAL | 0 refills | Status: DC
  Filled 2020-11-27: qty 30, 30d supply, fill #0

## 2020-11-27 MED FILL — Clonidine HCl Tab 0.2 MG: ORAL | 30 days supply | Qty: 60 | Fill #2 | Status: AC

## 2020-11-27 MED FILL — Hydroxyzine HCl Tab 25 MG: ORAL | 30 days supply | Qty: 90 | Fill #1 | Status: AC

## 2020-11-27 MED FILL — Levetiracetam Tab 500 MG: ORAL | 30 days supply | Qty: 60 | Fill #0 | Status: AC

## 2020-11-27 MED FILL — Ergocalciferol Cap 1.25 MG (50000 Unit): ORAL | 28 days supply | Qty: 4 | Fill #0 | Status: AC

## 2020-11-27 MED FILL — Lisinopril Tab 40 MG: ORAL | 90 days supply | Qty: 90 | Fill #0 | Status: AC

## 2020-11-27 MED FILL — Citalopram Hydrobromide Tab 40 MG (Base Equiv): ORAL | 30 days supply | Qty: 30 | Fill #0 | Status: AC

## 2020-11-27 MED FILL — Carvedilol Tab 3.125 MG: ORAL | 90 days supply | Qty: 180 | Fill #1 | Status: AC

## 2020-11-27 NOTE — Telephone Encounter (Signed)
Requested medication (s) are due for refill today: yes  Requested medication (s) are on the active medication list: yes  Last refill:  07/03/20-07/03/21 #30 2 refills   Future visit scheduled: no  Notes to clinic:  [facility] , called patent to schedule appt no answer, LVMTBC to clinic. Do you want to refill RX?     Requested Prescriptions  Pending Prescriptions Disp Refills   amLODipine (NORVASC) 5 MG tablet 30 tablet 2    Sig: TAKE 1 TABLET (5 MG TOTAL) BY MOUTH DAILY.      Cardiovascular:  Calcium Channel Blockers Failed - 11/27/2020  1:55 PM      Failed - Last BP in normal range    BP Readings from Last 1 Encounters:  06/18/20 (!) 150/89          Failed - Valid encounter within last 6 months    Recent Outpatient Visits           7 months ago Fibrillary glomerulonephritis   [place], [name], MD   10 months ago Type 2 diabetes mellitus with stage 4 chronic kidney disease, without long-term current use of insulin ([place])   [place], [place], MD   1 year ago Type 2 diabetes mellitus with stage 3b chronic kidney disease, without long-term current use of insulin ([place])   [place], Enobong, MD   1 year ago Neuropathy   [place], Enobong, MD   2 years ago Stage 3 chronic kidney disease [facility])    [facility] Charlott Rakes, MD

## 2020-11-27 NOTE — Telephone Encounter (Signed)
Rx approved and sent with instructions to arrange an appt with Dr. Bettina Gavia for further refills. 1rs attempt

## 2020-11-30 ENCOUNTER — Other Ambulatory Visit: Payer: Self-pay

## 2020-11-30 MED ORDER — FUROSEMIDE 40 MG PO TABS
40.0000 mg | ORAL_TABLET | Freq: Every day | ORAL | 3 refills | Status: DC
  Filled 2020-11-30: qty 90, 90d supply, fill #0

## 2020-12-02 ENCOUNTER — Other Ambulatory Visit: Payer: Self-pay

## 2020-12-02 MED ORDER — PREGABALIN 25 MG PO CAPS
25.0000 mg | ORAL_CAPSULE | Freq: Every day | ORAL | 8 refills | Status: DC
  Filled 2020-12-02 – 2020-12-10 (×2): qty 30, 30d supply, fill #0
  Filled 2021-01-12: qty 30, 30d supply, fill #1
  Filled 2021-02-09: qty 30, 30d supply, fill #2
  Filled 2021-03-11: qty 30, 30d supply, fill #3
  Filled 2021-04-08: qty 30, 30d supply, fill #4
  Filled 2021-05-10: qty 30, 30d supply, fill #5
  Filled 2021-06-14: qty 30, 30d supply, fill #6
  Filled 2021-07-12: qty 30, 30d supply, fill #0

## 2020-12-03 ENCOUNTER — Other Ambulatory Visit: Payer: Self-pay

## 2020-12-07 ENCOUNTER — Other Ambulatory Visit: Payer: Self-pay

## 2020-12-08 ENCOUNTER — Other Ambulatory Visit: Payer: Self-pay

## 2020-12-08 MED ORDER — FUROSEMIDE 80 MG PO TABS
80.0000 mg | ORAL_TABLET | Freq: Every day | ORAL | 3 refills | Status: DC
  Filled 2020-12-08: qty 90, 90d supply, fill #0

## 2020-12-08 MED ORDER — AMLODIPINE BESYLATE 10 MG PO TABS
1.0000 | ORAL_TABLET | Freq: Every day | ORAL | 5 refills | Status: DC
  Filled 2020-12-08: qty 30, 30d supply, fill #0
  Filled 2021-02-09: qty 30, 30d supply, fill #1
  Filled 2021-03-11: qty 30, 30d supply, fill #2
  Filled 2021-04-08: qty 30, 30d supply, fill #3
  Filled 2021-05-10: qty 30, 30d supply, fill #4
  Filled 2021-06-14: qty 30, 30d supply, fill #5

## 2020-12-10 ENCOUNTER — Other Ambulatory Visit: Payer: Self-pay

## 2020-12-14 ENCOUNTER — Other Ambulatory Visit: Payer: Self-pay

## 2021-01-11 ENCOUNTER — Telehealth: Payer: Self-pay | Admitting: Cardiology

## 2021-01-11 ENCOUNTER — Other Ambulatory Visit: Payer: Self-pay

## 2021-01-11 DIAGNOSIS — E1169 Type 2 diabetes mellitus with other specified complication: Secondary | ICD-10-CM

## 2021-01-11 MED ORDER — CLONIDINE HCL 0.2 MG PO TABS
ORAL_TABLET | ORAL | 0 refills | Status: DC
  Filled 2021-01-11: qty 60, 30d supply, fill #0

## 2021-01-11 MED ORDER — EZETIMIBE 10 MG PO TABS
ORAL_TABLET | Freq: Every day | ORAL | 0 refills | Status: DC
  Filled 2021-01-11: qty 30, 30d supply, fill #0

## 2021-01-11 MED ORDER — ROSUVASTATIN CALCIUM 10 MG PO TABS
ORAL_TABLET | Freq: Every day | ORAL | 0 refills | Status: DC
  Filled 2021-01-11: qty 30, 30d supply, fill #0

## 2021-01-11 NOTE — Telephone Encounter (Signed)
Refills sent in per request 

## 2021-01-11 NOTE — Telephone Encounter (Signed)
*  STAT* If patient is at the pharmacy, call can be transferred to refill team.   1. Which medications need to be refilled? (please list name of each medication and dose if known)  rosuvastatin (CRESTOR) 10 MG tablet ezetimibe (ZETIA) 10 MG tablet cloNIDine (CATAPRES) 0.2 MG tablet  2. Which pharmacy/location (including street and city if local pharmacy) is medication to be sent to? [facility] [place]   3. Do they need a 30 day or 90 day supply? Needs enough medication to last him until his appointment on 02/26/21 with Dr. Bettina Gavia   Pt is completely out of medication.

## 2021-01-12 ENCOUNTER — Other Ambulatory Visit: Payer: Self-pay

## 2021-01-12 ENCOUNTER — Other Ambulatory Visit: Payer: Self-pay | Admitting: Pharmacist

## 2021-01-12 DIAGNOSIS — R569 Unspecified convulsions: Secondary | ICD-10-CM

## 2021-01-12 DIAGNOSIS — F32A Depression, unspecified: Secondary | ICD-10-CM

## 2021-01-12 DIAGNOSIS — J438 Other emphysema: Secondary | ICD-10-CM

## 2021-01-12 MED ORDER — CITALOPRAM HYDROBROMIDE 40 MG PO TABS
40.0000 mg | ORAL_TABLET | Freq: Every day | ORAL | 0 refills | Status: DC
  Filled 2021-01-12: qty 30, 30d supply, fill #0

## 2021-01-12 MED ORDER — LEVETIRACETAM 500 MG PO TABS
500.0000 mg | ORAL_TABLET | Freq: Two times a day (BID) | ORAL | 0 refills | Status: DC
  Filled 2021-01-12: qty 60, 30d supply, fill #0

## 2021-01-12 MED FILL — Albuterol Sulfate Inhal Aero 108 MCG/ACT (90MCG Base Equiv): RESPIRATORY_TRACT | 25 days supply | Qty: 18 | Fill #0 | Status: AC

## 2021-01-12 MED FILL — Fluticasone-Salmeterol Aer Powder BA 250-50 MCG/ACT: RESPIRATORY_TRACT | 30 days supply | Qty: 60 | Fill #1 | Status: AC

## 2021-01-12 MED FILL — Ergocalciferol Cap 1.25 MG (50000 Unit): ORAL | 28 days supply | Qty: 4 | Fill #1 | Status: AC

## 2021-01-19 ENCOUNTER — Other Ambulatory Visit: Payer: Self-pay

## 2021-01-19 MED ORDER — FUROSEMIDE 80 MG PO TABS
80.0000 mg | ORAL_TABLET | Freq: Two times a day (BID) | ORAL | 3 refills | Status: DC
  Filled 2021-01-19 – 2021-02-09 (×2): qty 180, 90d supply, fill #0

## 2021-01-26 ENCOUNTER — Other Ambulatory Visit: Payer: Self-pay

## 2021-02-03 ENCOUNTER — Other Ambulatory Visit: Payer: Self-pay

## 2021-02-03 DIAGNOSIS — N189 Chronic kidney disease, unspecified: Secondary | ICD-10-CM

## 2021-02-09 ENCOUNTER — Other Ambulatory Visit: Payer: Self-pay | Admitting: Cardiology

## 2021-02-09 ENCOUNTER — Telehealth: Payer: Self-pay | Admitting: Cardiology

## 2021-02-09 ENCOUNTER — Other Ambulatory Visit: Payer: Self-pay

## 2021-02-09 ENCOUNTER — Other Ambulatory Visit: Payer: Self-pay | Admitting: Family Medicine

## 2021-02-09 DIAGNOSIS — R569 Unspecified convulsions: Secondary | ICD-10-CM

## 2021-02-09 DIAGNOSIS — J438 Other emphysema: Secondary | ICD-10-CM

## 2021-02-09 DIAGNOSIS — F419 Anxiety disorder, unspecified: Secondary | ICD-10-CM

## 2021-02-09 DIAGNOSIS — F32A Depression, unspecified: Secondary | ICD-10-CM

## 2021-02-09 DIAGNOSIS — E1169 Type 2 diabetes mellitus with other specified complication: Secondary | ICD-10-CM

## 2021-02-09 MED ORDER — EZETIMIBE 10 MG PO TABS
ORAL_TABLET | Freq: Every day | ORAL | 0 refills | Status: DC
  Filled 2021-02-09: qty 30, 30d supply, fill #0

## 2021-02-09 MED ORDER — CITALOPRAM HYDROBROMIDE 40 MG PO TABS
40.0000 mg | ORAL_TABLET | Freq: Every day | ORAL | 0 refills | Status: DC
  Filled 2021-02-09: qty 30, 30d supply, fill #0

## 2021-02-09 MED ORDER — LEVETIRACETAM 500 MG PO TABS
500.0000 mg | ORAL_TABLET | Freq: Two times a day (BID) | ORAL | 0 refills | Status: DC
  Filled 2021-02-09: qty 60, 30d supply, fill #0

## 2021-02-09 MED ORDER — ALBUTEROL SULFATE HFA 108 (90 BASE) MCG/ACT IN AERS
2.0000 | INHALATION_SPRAY | Freq: Four times a day (QID) | RESPIRATORY_TRACT | 6 refills | Status: DC | PRN
  Filled 2021-02-09: qty 18, 25d supply, fill #0
  Filled 2021-05-10: qty 18, 25d supply, fill #1
  Filled 2021-07-12: qty 18, 25d supply, fill #0
  Filled 2021-10-01: qty 18, 25d supply, fill #1

## 2021-02-09 MED ORDER — CLONIDINE HCL 0.2 MG PO TABS
ORAL_TABLET | ORAL | 0 refills | Status: DC
  Filled 2021-02-09: qty 60, 30d supply, fill #0

## 2021-02-09 MED FILL — Lisinopril Tab 40 MG: ORAL | 90 days supply | Qty: 90 | Fill #1 | Status: AC

## 2021-02-09 MED FILL — Sodium Zirconium Cyclosilicate For Susp Packet 10 GM: ORAL | 30 days supply | Qty: 30 | Fill #1 | Status: AC

## 2021-02-09 MED FILL — Ergocalciferol Cap 1.25 MG (50000 Unit): ORAL | 28 days supply | Qty: 4 | Fill #2 | Status: AC

## 2021-02-09 MED FILL — Fluticasone-Salmeterol Aer Powder BA 250-50 MCG/ACT: RESPIRATORY_TRACT | 30 days supply | Qty: 60 | Fill #2 | Status: AC

## 2021-02-09 NOTE — Telephone Encounter (Signed)
Refill sent in per request with note to please schedule an appointment.

## 2021-02-09 NOTE — Telephone Encounter (Signed)
Copied from [place] [phone]. Topic: Appointment Scheduling - Scheduling Inquiry for Clinic >> Feb 09, 2021 11:49 AM [name] [name] D wrote: Reason for CRM: Pt has an appt Monday for a refill request on his prescriptions.  He is calling saying he will be out of his medications before Monday and wants to know if he can either get a early refill before Monday or get an appt earlier than Monday.  CB#  [phone]

## 2021-02-09 NOTE — Telephone Encounter (Signed)
Notes to clinic: Patient has appt on 02/15/2021 Review for refill   Requested Prescriptions  Pending Prescriptions Disp Refills   albuterol (VENTOLIN HFA) 108 (90 Base) MCG/ACT inhaler 8.5 g 6    Sig: INHALE 2 PUFFS INTO THE LUNGS EVERY 6 (SIX) HOURS AS NEEDED FOR WHEEZING OR SHORTNESS OF BREATH.     Pulmonology:  Beta Agonists Failed - 02/09/2021  2:20 PM      Failed - One inhaler should last at least one month. If the patient is requesting refills earlier, contact the patient to check for uncontrolled symptoms.      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, [place], MD   1 year ago Type 2 diabetes mellitus with stage 3b chronic kidney disease, without long-term current use of insulin ([place])   [place] [facility] Charlott Rakes, MD   2 years ago Neuropathy   [place], Enobong, MD   2 years ago Stage 3 chronic kidney disease [facility])   [place], Enobong, MD       Future Appointments             In 6 days Charlott Rakes, MD [place]   In 1 month [place], Hilton Cork, MD [facility] [place]             citalopram (CELEXA) 40 MG tablet 30 tablet 0    Sig: Take 1 tablet (40 mg total) by mouth daily.     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   [place], [place], MD   1 year ago Type 2 diabetes mellitus with stage 4 chronic  kidney disease, without long-term current use of insulin ([place])   [place], [place], MD   1 year ago Type 2 diabetes mellitus with stage 3b chronic kidney disease, without long-term current use of insulin ([place])   [place] [facility] [place], Charlane Ferretti, MD   2 years ago Neuropathy   [place], Charlane Ferretti, MD   2 years ago Stage 3 chronic kidney disease [facility])   [place], Charlane Ferretti, MD       Future Appointments             In 6 days Charlott Rakes, MD [place]   In 1 month [place], Hilton Cork, MD [facility] [place]            Passed - Completed PHQ-2 or PHQ-9 in the last 360 days       levETIRAcetam (KEPPRA) 500 MG tablet 60 tablet 0    Sig: Take 1 tablet (500 mg total) by mouth 2 (two) times daily.     Not Delegated - Neurology:  Anticonvulsants Failed - 02/09/2021  2:20 PM      Failed - This refill cannot be delegated      Failed - HCT in normal range and within 360 days    HCT  Date Value Ref Range Status  03/11/2019 46.9 39.0 - 52.0 % Final          Failed - HGB in normal range and within 360 days    Hemoglobin  Date Value Ref Range Status  03/11/2019 15.8 13.0 - 17.0 g/dL Final          Failed - PLT in normal range and within 360 days    Platelets  Date Value Ref Range Status  03/11/2019 278 150 - 400 K/uL Final          Failed - WBC in normal range and within 360 days    WBC  Date Value Ref Range Status  03/11/2019 10.4 4.0 - 10.5 K/uL Final          Passed - Valid encounter within last 12 months    Recent Outpatient Visits           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   [place], [place], MD   1 year ago Type 2 diabetes mellitus with stage 4 chronic kidney disease, without long-term current use of insulin ([place])   [place], [place], MD   1 year ago Type 2 diabetes mellitus with stage 3b chronic kidney disease, without long-term current use of insulin ([place])   [place], Charlane Ferretti, MD   2 years ago Neuropathy   [place], Charlane Ferretti, MD   2 years ago Stage 3 chronic kidney disease [facility])   [place], Enobong, MD       Future Appointments             In 6 days Charlott Rakes, MD [place]   In 1 month [place], Hilton Cork, MD [facility] [place]

## 2021-02-09 NOTE — Telephone Encounter (Signed)
*  STAT* If patient is at the pharmacy, call can be transferred to refill team.   1. Which medications need to be refilled? (please list name of each medication and dose if known) ezetimibe (ZETIA) 10 MG tablet cloNIDine (CATAPRES) 0.2 MG tablet  2. Which pharmacy/location (including street and city if local pharmacy) is medication to be sent to? [facility] [place]  3. Do they need a 30 day or 90 day supply? 30 day supply    Pt is out of medication

## 2021-02-09 NOTE — Addendum Note (Signed)
Addended by: Resa Miner I on: 02/09/2021 01:08 PM   Modules accepted: Orders

## 2021-02-10 ENCOUNTER — Other Ambulatory Visit: Payer: Self-pay

## 2021-02-15 ENCOUNTER — Other Ambulatory Visit: Payer: Self-pay

## 2021-02-15 ENCOUNTER — Encounter: Payer: Self-pay | Admitting: Family Medicine

## 2021-02-15 ENCOUNTER — Ambulatory Visit: Payer: Medicare Other | Attending: Family Medicine | Admitting: Family Medicine

## 2021-02-15 VITALS — BP 97/63 | HR 52 | Ht 72.0 in | Wt 227.0 lb

## 2021-02-15 DIAGNOSIS — Z833 Family history of diabetes mellitus: Secondary | ICD-10-CM | POA: Diagnosis not present

## 2021-02-15 DIAGNOSIS — Z8249 Family history of ischemic heart disease and other diseases of the circulatory system: Secondary | ICD-10-CM | POA: Diagnosis not present

## 2021-02-15 DIAGNOSIS — N185 Chronic kidney disease, stage 5: Secondary | ICD-10-CM | POA: Diagnosis not present

## 2021-02-15 DIAGNOSIS — G629 Polyneuropathy, unspecified: Secondary | ICD-10-CM | POA: Diagnosis not present

## 2021-02-15 DIAGNOSIS — L989 Disorder of the skin and subcutaneous tissue, unspecified: Secondary | ICD-10-CM | POA: Insufficient documentation

## 2021-02-15 DIAGNOSIS — Z7951 Long term (current) use of inhaled steroids: Secondary | ICD-10-CM | POA: Insufficient documentation

## 2021-02-15 DIAGNOSIS — E1122 Type 2 diabetes mellitus with diabetic chronic kidney disease: Secondary | ICD-10-CM

## 2021-02-15 DIAGNOSIS — Z79899 Other long term (current) drug therapy: Secondary | ICD-10-CM | POA: Insufficient documentation

## 2021-02-15 DIAGNOSIS — I2510 Atherosclerotic heart disease of native coronary artery without angina pectoris: Secondary | ICD-10-CM | POA: Diagnosis not present

## 2021-02-15 DIAGNOSIS — E1169 Type 2 diabetes mellitus with other specified complication: Secondary | ICD-10-CM | POA: Diagnosis not present

## 2021-02-15 DIAGNOSIS — N1832 Chronic kidney disease, stage 3b: Secondary | ICD-10-CM

## 2021-02-15 DIAGNOSIS — I120 Hypertensive chronic kidney disease with stage 5 chronic kidney disease or end stage renal disease: Secondary | ICD-10-CM | POA: Insufficient documentation

## 2021-02-15 DIAGNOSIS — Z23 Encounter for immunization: Secondary | ICD-10-CM

## 2021-02-15 DIAGNOSIS — F17210 Nicotine dependence, cigarettes, uncomplicated: Secondary | ICD-10-CM | POA: Diagnosis not present

## 2021-02-15 DIAGNOSIS — E1140 Type 2 diabetes mellitus with diabetic neuropathy, unspecified: Secondary | ICD-10-CM | POA: Insufficient documentation

## 2021-02-15 DIAGNOSIS — J449 Chronic obstructive pulmonary disease, unspecified: Secondary | ICD-10-CM | POA: Insufficient documentation

## 2021-02-15 DIAGNOSIS — Z981 Arthrodesis status: Secondary | ICD-10-CM | POA: Diagnosis not present

## 2021-02-15 LAB — POCT GLYCOSYLATED HEMOGLOBIN (HGB A1C): HbA1c, POC (controlled diabetic range): 7.1 % — AB (ref 0.0–7.0)

## 2021-02-15 LAB — GLUCOSE, POCT (MANUAL RESULT ENTRY): POC Glucose: 203 mg/dl — AB (ref 70–99)

## 2021-02-15 MED ORDER — LIDOCAINE 5 % EX PTCH
1.0000 | MEDICATED_PATCH | CUTANEOUS | 3 refills | Status: DC
  Filled 2021-02-15 – 2021-04-09 (×2): qty 30, 30d supply, fill #0

## 2021-02-15 MED ORDER — BUPROPION HCL ER (XL) 150 MG PO TB24
150.0000 mg | ORAL_TABLET | Freq: Every day | ORAL | 2 refills | Status: DC
Start: 2021-02-15 — End: 2022-02-03
  Filled 2021-02-15: qty 30, 30d supply, fill #0
  Filled 2021-03-11: qty 30, 30d supply, fill #1

## 2021-02-15 MED ORDER — ROSUVASTATIN CALCIUM 10 MG PO TABS
ORAL_TABLET | Freq: Every day | ORAL | 1 refills | Status: DC
  Filled 2021-02-15: qty 90, 90d supply, fill #0

## 2021-02-15 NOTE — Patient Instructions (Signed)
Managing the Challenge of Quitting Smoking Quitting smoking is a physical and mental challenge. You will face cravings, withdrawal symptoms, and temptation. Before quitting, work with your health care provider to make a plan that can help you manage quitting. Preparation canhelp you quit and keep you from giving in. How to manage lifestyle changes Managing stress Stress can make you want to smoke, and wanting to smoke may cause stress. It is important to find ways to manage your stress. You might try some of the following: Practice relaxation techniques. Breathe slowly and deeply, in through your nose and out through your mouth. Listen to music. Soak in a bath or take a shower. Imagine a peaceful place or vacation. Get some support. Talk with family or friends about your stress. Join a support group. Talk with a counselor or therapist. Get some physical activity. Go for a walk, run, or bike ride. Play a favorite sport. Practice yoga.  Medicines Talk with your health care provider about medicines that might help you dealwith cravings and make quitting easier for you. Relationships Social situations can be difficult when you are quitting smoking. To manage this, you can: Avoid parties and other social situations where people might be smoking. Avoid alcohol. Leave right away if you have the urge to smoke. Explain to your family and friends that you are quitting smoking. Ask for support and let them know you might be a bit grumpy. Plan activities where smoking is not an option. General instructions Be aware that many people gain weight after they quit smoking. However, not everyone does. To keep from gaining weight, have a plan in place before you quit and stick to the plan after you quit. Your plan should include: Having healthy snacks. When you have a craving, it may help to: Eat popcorn, carrots, celery, or other cut vegetables. Chew sugar-free gum. Changing how you eat. Eat small  portion sizes at meals. Eat 4-6 small meals throughout the day instead of 1-2 large meals a day. Be mindful when you eat. Do not watch television or do other things that might distract you as you eat. Exercising regularly. Make time to exercise each day. If you do not have time for a long workout, do short bouts of exercise for 5-10 minutes several times a day. Do some form of strengthening exercise, such as weight lifting. Do some exercise that gets your heart beating and causes you to breathe deeply, such as walking fast, running, swimming, or biking. This is very important. Drinking plenty of water or other low-calorie or no-calorie drinks. Drink 6-8 glasses of water daily.  How to recognize withdrawal symptoms Your body and mind may experience discomfort as you try to get used to not having nicotine in your system. These effects are called withdrawal symptoms. They may include: Feeling hungrier than normal. Having trouble concentrating. Feeling irritable or restless. Having trouble sleeping. Feeling depressed. Craving a cigarette. To manage withdrawal symptoms: Avoid places, people, and activities that trigger your cravings. Remember why you want to quit. Get plenty of sleep. Avoid coffee and other caffeinated drinks. These may worsen some of your symptoms. These symptoms may surprise you. But be assured that they are normal to havewhen quitting smoking. How to manage cravings Come up with a plan for how to deal with your cravings. The plan should include the following: A definition of the specific situation you want to deal with. An alternative action you will take. A clear idea for how this action will help. The   name of someone who might help you with this. Cravings usually last for 5-10 minutes. Consider taking the following actions to help you with your plan to deal with cravings: Keep your mouth busy. Chew sugar-free gum. Suck on hard candies or a straw. Brush your  teeth. Keep your hands and body busy. Change to a different activity right away. Squeeze or play with a ball. Do an activity or a hobby, such as making bead jewelry, practicing needlepoint, or working with wood. Mix up your normal routine. Take a short exercise break. Go for a quick walk or run up and down stairs. Focus on doing something kind or helpful for someone else. Call a friend or family member to talk during a craving. Join a support group. Contact a quitline. Where to find support To get help or find a support group: Call the National Cancer Institute's Smoking Quitline: 1-800-QUIT NOW (784-8669) Visit the website of the Substance Abuse and Mental Health Services Administration: www.samhsa.gov Text QUIT to SmokefreeTXT: 478848 Where to find more information Visit these websites to find more information on quitting smoking: National Cancer Institute: www.smokefree.gov American Lung Association: www.lung.org American Cancer Society: www.cancer.org Centers for Disease Control and Prevention: www.cdc.gov American Heart Association: www.heart.org Contact a health care provider if: You want to change your plan for quitting. The medicines you are taking are not helping. Your eating feels out of control or you cannot sleep. Get help right away if: You feel depressed or become very anxious. Summary Quitting smoking is a physical and mental challenge. You will face cravings, withdrawal symptoms, and temptation to smoke again. Preparation can help you as you go through these challenges. Try different techniques to manage stress, handle social situations, and prevent weight gain. You can deal with cravings by keeping your mouth busy (such as by chewing gum), keeping your hands and body busy, calling family or friends, or contacting a quitline for people who want to quit smoking. You can deal with withdrawal symptoms by avoiding places where people smoke, getting plenty of rest, and  avoiding drinks with caffeine. This information is not intended to replace advice given to you by your health care provider. Make sure you discuss any questions you have with your healthcare provider. Document Revised: 04/02/2019 Document Reviewed: 04/02/2019 Elsevier Patient Education  2022 [employer]  

## 2021-02-15 NOTE — H&P (View-Only) (Signed)
[facility] [place]  ASSESSMENT / PLAN: Alf Doyle Sr. is a 60 y.o. right handed male in need of permanent hemodialysis access. I reviewed options for dialysis in detail with the patient. I counseled the patient that dialysis access requires surveillance and periodic maintenance. Plan to proceed with left brachiobasilic arteriovenous fistula 03/01/21. Of note a high brachial bifurcation was noted on preoperative ultrasound.  CHIEF COMPLAINT: dialysis access  HISTORY OF PRESENT ILLNESS: Norwood Quezada Sr. is a 60 y.o. male with deteriorating renal function.  He is in need of dialysis access.  Patient is right-handed.  He suffered a traumatic injury to his cervical spine many years ago and has limited function in his left upper extremity.  He reports no other history of traumatic injury to the upper extremities.  He has never had a central venous catheter or pacemaker.  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   Kidney disease    Myelopathy ([place]) 09/14/2017   Tobacco abuse 09/29/2017   Type 2 diabetes mellitus ([place]) 10/17/2011    Past Surgical History:  Procedure Laterality Date   ANTERIOR CERVICAL DECOMP/DISCECTOMY FUSION N/A 09/14/2017   Procedure: ANTERIOR CERVICAL DECOMPRESSION/DISCECTOMY FUSIONCERVICAL 4- CERVICAL 5, CERVICAL 5- CERVICAL 6;  Surgeon: Kary Kos, MD;  Location: [facility];  Service: Neurosurgery;  Laterality: N/A;  ANTERIOR CERVICAL DECOMPRESSION/DISCECTOMY FUSIONCERVICAL 4- CERVICAL 5, CERVICAL 5- CERVICAL 6    Family History  Problem Relation Age of Onset   Diabetes Mother    Scoliosis Mother    Kidney cancer Father     Lung cancer Father    Hypertension Brother    Colon cancer Neg Hx    Pancreatic cancer Neg Hx    Prostate cancer Neg Hx    Rectal cancer Neg Hx    Stomach cancer Neg Hx     Social History   Socioeconomic History   Marital status: Single    Spouse name: Not on file   Number of children: Not on file   Years of education: Not on file   Highest education level: Not on file  Occupational History   Not on file  Tobacco Use   Smoking status: Every Day    Packs/day: 1.00    Years: 42.00    Pack years: 42.00    Types: Cigarettes   Smokeless tobacco: Never   Tobacco comments:    1 pack a day   Vaping Use   Vaping Use: Never used  Substance and Sexual Activity   Alcohol use: Yes    Alcohol/week: 10.0 standard drinks    Types: 10 Cans of beer per week    Comment: 28/week   Drug use: Yes    Types: Marijuana   Sexual activity: Not on file  Other Topics Concern   Not on file  Social History Narrative   Not on file   Social Determinants of Health   Financial Resource Strain: Not on file  Food Insecurity: Not on file  Transportation Needs: Not on file  Physical Activity: Not on file  Stress: Not on file  Social Connections: Not on file  Intimate Partner Violence: Not on file  No Known Allergies  Current Outpatient Medications  Medication Sig Dispense Refill   albuterol (VENTOLIN HFA) 108 (90 Base) MCG/ACT inhaler INHALE 2 PUFFS INTO THE LUNGS EVERY 6 (SIX) HOURS AS NEEDED FOR WHEEZING OR SHORTNESS OF BREATH. 18 g 6   amLODipine (NORVASC) 10 MG tablet Take 1 tablet by mouth at bedtime 30 tablet 5   buPROPion (WELLBUTRIN XL) 150 MG 24 hr tablet Take 1 tablet (150 mg total) by mouth daily. 30 tablet 2   carvedilol (COREG) 3.125 MG tablet TAKE 1 TABLET BY MOUTH TWICE DAILY. 180 tablet 6   citalopram (CELEXA) 40 MG tablet Take 1 tablet (40 mg total) by mouth daily. 30 tablet 0   cloNIDine (CATAPRES) 0.2 MG tablet TAKE 1 TABLET (0.2 MG TOTAL) BY MOUTH 2 (TWO) TIMES  DAILY. 60 tablet 0   ezetimibe (ZETIA) 10 MG tablet TAKE 1 TABLET (10 MG TOTAL) BY MOUTH DAILY. 30 tablet 0   fluticasone-salmeterol (ADVAIR) 250-50 MCG/ACT AEPB Inhale 1 puff into the lungs 2 (two) times daily. 60 each 6   furosemide (LASIX) 80 MG tablet Take 1 tablet by mouth twice a day 180 tablet 3   hydrOXYzine (ATARAX/VISTARIL) 25 MG tablet TAKE 1 TABLET (25 MG TOTAL) BY MOUTH 3 (THREE) TIMES DAILY AS NEEDED FOR ANXIETY. 90 tablet 3   levETIRAcetam (KEPPRA) 500 MG tablet Take 1 tablet (500 mg total) by mouth 2 (two) times daily. 60 tablet 0   lidocaine (LIDODERM) 5 % Place 1 patch onto the skin daily. Remove & Discard patch within 12 hours or as directed by MD 30 patch 3   lisinopril (ZESTRIL) 40 MG tablet TAKE 1 TABLET BY MOUTH DAILY 90 tablet 3   pregabalin (LYRICA) 25 MG capsule Take 1 capsule (25 mg total) by mouth at bedtime. 30 capsule 8   rosuvastatin (CRESTOR) 10 MG tablet TAKE 1 TABLET (10 MG TOTAL) BY MOUTH DAILY. 90 tablet 1   sodium zirconium cyclosilicate (LOKELMA) 10 g PACK packet TAKE 1 PACKET BY MOUTH DAILY 30 each 3   thiamine 100 MG tablet Take 1 tablet (100 mg total) by mouth daily. 30 tablet 0   vitamin B-12 1000 MCG tablet Take 1 tablet (1,000 mcg total) by mouth daily. 30 tablet 0   Vitamin D, Ergocalciferol, (DRISDOL) 1.25 MG (50000 UNIT) CAPS capsule 1 (ONE) CAPSULE BY MOUTH WEEKLY 17 capsule 3   cetirizine (ZYRTEC) 10 MG tablet Take 10 mg by mouth daily as needed for allergies.  (Patient not taking: No sig reported)     No current facility-administered medications for this visit.    REVIEW OF SYSTEMS:  [X]  denotes positive finding, [ ]  denotes negative finding Cardiac  Comments:  Chest pain or chest pressure:    Shortness of breath upon exertion:    Short of breath when lying flat:    Irregular heart rhythm:        Vascular    Pain in calf, thigh, or hip brought on by ambulation:    Pain in feet at night that wakes you up from your sleep:     Blood clot in  your veins:    Leg swelling:         Pulmonary    Oxygen at home:    Productive cough:     Wheezing:         Neurologic    Sudden weakness in arms or legs:     Sudden numbness in arms or legs:     Sudden onset of  difficulty speaking or slurred speech:    Temporary loss of vision in one eye:     Problems with dizziness:         Gastrointestinal    Blood in stool:     Vomited blood:         Genitourinary    Burning when urinating:     Blood in urine:        Psychiatric    Major depression:         Hematologic    Bleeding problems:    Problems with blood clotting too easily:        Skin    Rashes or ulcers:        Constitutional    Fever or chills:      PHYSICAL EXAM Vitals:   02/16/21 1605  BP: 102/69  Pulse: (!) 55  Resp: 20  Temp: 98.2 F (36.8 C)  SpO2: 97%  Weight: 223 lb (101.2 kg)  Height: 6' (1.829 m)    Constitutional: well appearing. no distress. Appears well nourished.  Neurologic: CN intact. no focal findings. no sensory loss. Psychiatric:  Mood and affect symmetric and appropriate. Eyes:  No icterus. No conjunctival pallor. Ears, nose, throat:  mucous membranes moist. Midline trachea.  Cardiac: regular rate and rhythm.  Respiratory:  unlabored. Abdominal:  soft, non-tender, non-distended.  Peripheral vascular: 2+ radial and brachial pulses Extremity: no edema. no cyanosis. no pallor.  Skin: no gangrene. no ulceration.  Lymphatic: no Stemmer's sign. no palpable lymphadenopathy.  PERTINENT LABORATORY AND RADIOLOGIC DATA  Most recent CBC CBC Latest Ref Rng & Units 03/11/2019 06/14/2018 06/12/2018  WBC 4.0 - 10.5 K/uL 10.4 11.2(H) 11.6(H)  Hemoglobin 13.0 - 17.0 g/dL 15.8 14.3 15.9  Hematocrit 39.0 - 52.0 % 46.9 41.8 48.9  Platelets 150 - 400 K/uL 278 299 321     Most recent CMP CMP Latest Ref Rng & Units 10/14/2019 10/10/2019 10/07/2019  Glucose 65 - 99 mg/dL 134(H) 136(H) 143(H)  BUN 8 - 27 mg/dL 34(H) 42(H) 35(H)  Creatinine 0.76 -  1.27 mg/dL 2.26(H) 2.72(H) 2.40(H)  Sodium 134 - 144 mmol/L 136 136 139  Potassium 3.5 - 5.2 mmol/L 5.7(H) 6.2(H) 5.6(H)  Chloride 96 - 106 mmol/L 106 107(H) 105  CO2 20 - 29 mmol/L 18(L) 16(L) 15(L)  Calcium 8.6 - 10.2 mg/dL 9.0 9.1 9.3  Total Protein 6.0 - 8.5 g/dL - 5.4(L) 5.9(L)  Total Bilirubin 0.0 - 1.2 mg/dL - 0.2 0.3  Alkaline Phos 39 - 117 IU/L - 61 69  AST 0 - 40 IU/L - 13 18  ALT 0 - 44 IU/L - 14 15    Renal function CrCl cannot be calculated (Patient's most recent lab result is older than the maximum 21 days allowed.).  HbA1c, POC (prediabetic range) (%)  Date Value  03/12/2018 6.6 (A)   HbA1c, POC (controlled diabetic range) (%)  Date Value  02/15/2021 7.1 (A)    LDL Chol Calc (NIH)  Date Value Ref Range Status  10/10/2019 141 (H) 0 - 99 mg/dL Final     Vascular Imaging: UPPER EXTREMITY VEIN MAPPING   Patient Name:  Buster Schueller Sr.  Date of Exam:   02/16/2021  Medical Rec #: [id number]          Accession #:    [id number]  Date of Birth: 04-Mar-1961          Patient Gender: M  Patient Age:   60 years  Exam Location:  [facility]  Procedure:      VAS [place] UPPER EXT VEIN MAPPING (PRE-OP AVF)  Referring Phys: Jamelle Haring    ---------------------------------------------------------------------------  -----     Indications: Pre-access.   Performing Technologist: June Leap RDMS, RVT      Examination Guidelines: A complete evaluation includes B-mode imaging,  spectral  Doppler, color Doppler, and power Doppler as needed of all accessible  portions  of each vessel. Bilateral testing is considered an integral part of a  complete  examination. Limited examinations for reoccurring indications may be  performed  as noted.   +-----------------+-------------+----------+--------------+  Right Cephalic   Diameter (cm)Depth (cm)   Findings     +-----------------+-------------+----------+--------------+  Shoulder             0.22                                +-----------------+-------------+----------+--------------+  Mid upper arm        0.36                               +-----------------+-------------+----------+--------------+  Dist upper arm       0.33                               +-----------------+-------------+----------+--------------+  Antecubital fossa    0.29                               +-----------------+-------------+----------+--------------+  Prox forearm         0.24                               +-----------------+-------------+----------+--------------+  Mid forearm                             not visualized  +-----------------+-------------+----------+--------------+  Dist forearm                            not visualized  +-----------------+-------------+----------+--------------+  Wrist                                   not visualized  +-----------------+-------------+----------+--------------+   +-----------------+-------------+----------+--------------+  Right Basilic    Diameter (cm)Depth (cm)   Findings     +-----------------+-------------+----------+--------------+  Mid upper arm                           not visualized  +-----------------+-------------+----------+--------------+  Dist upper arm       0.27                               +-----------------+-------------+----------+--------------+  Antecubital fossa    0.23                               +-----------------+-------------+----------+--------------+  Prox forearm  not visualized  +-----------------+-------------+----------+--------------+   +-----------------+-------------+----------+--------+  Left Cephalic    Diameter (cm)Depth (cm)Findings  +-----------------+-------------+----------+--------+  Shoulder             0.24                         +-----------------+-------------+----------+--------+  Mid upper arm         0.20                         +-----------------+-------------+----------+--------+  Dist upper arm       0.32                         +-----------------+-------------+----------+--------+  Antecubital fossa    0.25                         +-----------------+-------------+----------+--------+  Prox forearm         0.25                         +-----------------+-------------+----------+--------+  Mid forearm          0.30                         +-----------------+-------------+----------+--------+  Wrist                0.35                         +-----------------+-------------+----------+--------+   +-----------------+-------------+----------+---------+  Left Basilic     Diameter (cm)Depth (cm)Findings   +-----------------+-------------+----------+---------+  Mid upper arm        0.47                          +-----------------+-------------+----------+---------+  Dist upper arm       0.44                          +-----------------+-------------+----------+---------+  Antecubital fossa    0.41                          +-----------------+-------------+----------+---------+  Prox forearm         0.29               branching  +-----------------+-------------+----------+---------+   *See table(s) above for measurements and observations.       Diagnosing physician: Jamelle Haring  Electronically signed by Jamelle Haring on 02/16/2021 at 4:12:11 PM.  A high brachial bifurcation was noted on arterial duplex.  Yevonne Aline. Stanford Breed, MD [facility] Phone Number: [phone] 02/16/2021 4:26 PM  Total time spent on preparing this encounter including chart review, data review, collecting history, examining the patient, coordinating care for this new patient, 45 minutes.  Portions of this report may have been transcribed using voice recognition software.  Every effort has been made to ensure  accuracy; however, inadvertent computerized transcription errors may still be present.

## 2021-02-15 NOTE — Progress Notes (Signed)
[facility]   ASSESSMENT / PLAN: [name] Titzer Sr. is a 60 y.o. right handed male in need of permanent hemodialysis access. I reviewed options for dialysis in detail with the patient. I counseled the patient that dialysis access requires surveillance and periodic maintenance. Plan to proceed with left brachiobasilic arteriovenous fistula 03/01/21. Of note a high brachial bifurcation was noted on preoperative ultrasound.  CHIEF COMPLAINT: dialysis access  HISTORY OF PRESENT ILLNESS: [name] Levi Sr. is a 60 y.o. male with deteriorating renal function.  He is in need of dialysis access.  Patient is right-handed.  He suffered a traumatic injury to his cervical spine many years ago and has limited function in his left upper extremity.  He reports no other history of traumatic injury to the upper extremities.  He has never had a central venous catheter or pacemaker.  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;  Service: Neurosurgery;  Laterality: N/A;  ANTERIOR CERVICAL DECOMPRESSION/DISCECTOMY FUSIONCERVICAL 4- CERVICAL 5, CERVICAL 5- CERVICAL 6    Family History  Problem Relation Age of Onset   Diabetes Mother    Scoliosis Mother    Kidney cancer Father     Lung cancer Father    Hypertension Brother    Colon cancer Neg Hx    Pancreatic cancer Neg Hx    Prostate cancer Neg Hx    Rectal cancer Neg Hx    Stomach cancer Neg Hx     Social History   Socioeconomic History   Marital status: Single    Spouse name: Not on file   Number of children: Not on file   Years of education: Not on file   Highest education level: Not on file  Occupational History   Not on file  Tobacco Use   Smoking status: Every Day    Packs/day: 1.00    Years: 42.00    Pack years: 42.00    Types: Cigarettes   Smokeless tobacco: Never   Tobacco comments:    1 pack a day   Vaping Use   Vaping Use: Never used  Substance and Sexual Activity   Alcohol use: Yes    Alcohol/week: 10.0 standard drinks    Types: 10 Cans of beer per week    Comment: 28/week   Drug use: Yes    Types: Marijuana   Sexual activity: Not on file  Other Topics Concern   Not on file  Social History Narrative   Not on file   Social Determinants of Health   Financial Resource Strain: Not on file  Food Insecurity: Not on file  Transportation Needs: Not on file  Physical Activity: Not on file  Stress: Not on file  Social Connections: Not on file  Intimate Partner Violence: Not on file  No Known Allergies  Current Outpatient Medications  Medication Sig Dispense Refill   albuterol (VENTOLIN HFA) 108 (90 Base) MCG/ACT inhaler INHALE 2 PUFFS INTO THE LUNGS EVERY 6 (SIX) HOURS AS NEEDED FOR WHEEZING OR SHORTNESS OF BREATH. 18 g 6   amLODipine (NORVASC) 10 MG tablet Take 1 tablet by mouth at bedtime 30 tablet 5   buPROPion (WELLBUTRIN XL) 150 MG 24 hr tablet Take 1 tablet (150 mg total) by mouth daily. 30 tablet 2   carvedilol (COREG) 3.125 MG tablet TAKE 1 TABLET BY MOUTH TWICE DAILY. 180 tablet 6   citalopram (CELEXA) 40 MG tablet Take 1 tablet (40 mg total) by mouth daily. 30 tablet 0   cloNIDine (CATAPRES) 0.2 MG tablet TAKE 1 TABLET (0.2 MG TOTAL) BY MOUTH 2 (TWO) TIMES  DAILY. 60 tablet 0   ezetimibe (ZETIA) 10 MG tablet TAKE 1 TABLET (10 MG TOTAL) BY MOUTH DAILY. 30 tablet 0   fluticasone-salmeterol (ADVAIR) 250-50 MCG/ACT AEPB Inhale 1 puff into the lungs 2 (two) times daily. 60 each 6   furosemide (LASIX) 80 MG tablet Take 1 tablet by mouth twice a day 180 tablet 3   hydrOXYzine (ATARAX/VISTARIL) 25 MG tablet TAKE 1 TABLET (25 MG TOTAL) BY MOUTH 3 (THREE) TIMES DAILY AS NEEDED FOR ANXIETY. 90 tablet 3   levETIRAcetam (KEPPRA) 500 MG tablet Take 1 tablet (500 mg total) by mouth 2 (two) times daily. 60 tablet 0   lidocaine (LIDODERM) 5 % Place 1 patch onto the skin daily. Remove & Discard patch within 12 hours or as directed by MD 30 patch 3   lisinopril (ZESTRIL) 40 MG tablet TAKE 1 TABLET BY MOUTH DAILY 90 tablet 3   pregabalin (LYRICA) 25 MG capsule Take 1 capsule (25 mg total) by mouth at bedtime. 30 capsule 8   rosuvastatin (CRESTOR) 10 MG tablet TAKE 1 TABLET (10 MG TOTAL) BY MOUTH DAILY. 90 tablet 1   sodium zirconium cyclosilicate (LOKELMA) 10 g PACK packet TAKE 1 PACKET BY MOUTH DAILY 30 each 3   thiamine 100 MG tablet Take 1 tablet (100 mg total) by mouth daily. 30 tablet 0   vitamin B-12 1000 MCG tablet Take 1 tablet (1,000 mcg total) by mouth daily. 30 tablet 0   Vitamin D, Ergocalciferol, (DRISDOL) 1.25 MG (50000 UNIT) CAPS capsule 1 (ONE) CAPSULE BY MOUTH WEEKLY 17 capsule 3   cetirizine (ZYRTEC) 10 MG tablet Take 10 mg by mouth daily as needed for allergies.  (Patient not taking: No sig reported)     No current facility-administered medications for this visit.    REVIEW OF SYSTEMS:  [X]  denotes positive finding, [ ]  denotes negative finding Cardiac  Comments:  Chest pain or chest pressure:    Shortness of breath upon exertion:    Short of breath when lying flat:    Irregular heart rhythm:        Vascular    Pain in calf, thigh, or hip brought on by ambulation:    Pain in feet at night that wakes you up from your sleep:     Blood clot in  your veins:    Leg swelling:         Pulmonary    Oxygen at home:    Productive cough:     Wheezing:         Neurologic    Sudden weakness in arms or legs:     Sudden numbness in arms or legs:     Sudden onset of  difficulty speaking or slurred speech:    Temporary loss of vision in one eye:     Problems with dizziness:         Gastrointestinal    Blood in stool:     Vomited blood:         Genitourinary    Burning when urinating:     Blood in urine:        Psychiatric    Major depression:         Hematologic    Bleeding problems:    Problems with blood clotting too easily:        Skin    Rashes or ulcers:        Constitutional    Fever or chills:      PHYSICAL EXAM Vitals:   02/16/21 1605  BP: 102/69  Pulse: (!) 55  Resp: 20  Temp: 98.2 F (36.8 C)  SpO2: 97%  Weight: 223 lb (101.2 kg)  Height: 6' (1.829 m)    Constitutional: well appearing. no distress. Appears well nourished.  Neurologic: CN intact. no focal findings. no sensory loss. Psychiatric:  Mood and affect symmetric and appropriate. Eyes:  No icterus. No conjunctival pallor. Ears, nose, throat:  mucous membranes moist. Midline trachea.  Cardiac: regular rate and rhythm.  Respiratory:  unlabored. Abdominal:  soft, non-tender, non-distended.  Peripheral vascular: 2+ radial and brachial pulses Extremity: no edema. no cyanosis. no pallor.  Skin: no gangrene. no ulceration.  Lymphatic: no Stemmer's sign. no palpable lymphadenopathy.  PERTINENT LABORATORY AND RADIOLOGIC DATA  Most recent CBC CBC Latest Ref Rng & Units 03/11/2019 06/14/2018 06/12/2018  WBC 4.0 - 10.5 K/uL 10.4 11.2(H) 11.6(H)  Hemoglobin 13.0 - 17.0 g/dL 15.8 14.3 15.9  Hematocrit 39.0 - 52.0 % 46.9 41.8 48.9  Platelets 150 - 400 K/uL 278 299 321     Most recent CMP CMP Latest Ref Rng & Units 10/14/2019 10/10/2019 10/07/2019  Glucose 65 - 99 mg/dL 134(H) 136(H) 143(H)  BUN 8 - 27 mg/dL 34(H) 42(H) 35(H)  Creatinine 0.76 -  1.27 mg/dL 2.26(H) 2.72(H) 2.40(H)  Sodium 134 - 144 mmol/L 136 136 139  Potassium 3.5 - 5.2 mmol/L 5.7(H) 6.2(H) 5.6(H)  Chloride 96 - 106 mmol/L 106 107(H) 105  CO2 20 - 29 mmol/L 18(L) 16(L) 15(L)  Calcium 8.6 - 10.2 mg/dL 9.0 9.1 9.3  Total Protein 6.0 - 8.5 g/dL - 5.4(L) 5.9(L)  Total Bilirubin 0.0 - 1.2 mg/dL - 0.2 0.3  Alkaline Phos 39 - 117 IU/L - 61 69  AST 0 - 40 IU/L - 13 18  ALT 0 - 44 IU/L - 14 15    Renal function CrCl cannot be calculated (Patient's most recent lab result is older than the maximum 21 days allowed.).  HbA1c, POC (prediabetic range) (%)  Date Value  03/12/2018 6.6 (A)   HbA1c, POC (controlled diabetic range) (%)  Date Value  02/15/2021 7.1 (A)    LDL Chol Calc (NIH)  Date Value Ref Range Status  10/10/2019 141 (H) 0 - 99 mg/dL Final     Vascular Imaging: UPPER EXTREMITY VEIN MAPPING   Patient Name:  [name] Schellenberg Sr.  Date of Exam:   02/16/2021  Medical Rec #: [id number]          Accession #:    [id number]  Date of Birth: 08-29-1960          Patient Gender: M  Patient Age:   72 years  Exam Location:  [facility]  Procedure:      VAS [place] UPPER EXT VEIN MAPPING (PRE-OP AVF)  Referring Phys: Jamelle Haring    ---------------------------------------------------------------------------  -----     Indications: Pre-access.   Performing Technologist: June Leap RDMS, RVT      Examination Guidelines: A complete evaluation includes B-mode imaging,  spectral  Doppler, color Doppler, and power Doppler as needed of all accessible  portions  of each vessel. Bilateral testing is considered an integral part of a  complete  examination. Limited examinations for reoccurring indications may be  performed  as noted.   +-----------------+-------------+----------+--------------+  Right Cephalic   Diameter (cm)Depth (cm)   Findings     +-----------------+-------------+----------+--------------+  Shoulder             0.22                                +-----------------+-------------+----------+--------------+  Mid upper arm        0.36                               +-----------------+-------------+----------+--------------+  Dist upper arm       0.33                               +-----------------+-------------+----------+--------------+  Antecubital fossa    0.29                               +-----------------+-------------+----------+--------------+  Prox forearm         0.24                               +-----------------+-------------+----------+--------------+  Mid forearm                             not visualized  +-----------------+-------------+----------+--------------+  Dist forearm                            not visualized  +-----------------+-------------+----------+--------------+  Wrist                                   not visualized  +-----------------+-------------+----------+--------------+   +-----------------+-------------+----------+--------------+  Right Basilic    Diameter (cm)Depth (cm)   Findings     +-----------------+-------------+----------+--------------+  Mid upper arm                           not visualized  +-----------------+-------------+----------+--------------+  Dist upper arm       0.27                               +-----------------+-------------+----------+--------------+  Antecubital fossa    0.23                               +-----------------+-------------+----------+--------------+  Prox forearm  not visualized  +-----------------+-------------+----------+--------------+   +-----------------+-------------+----------+--------+  Left Cephalic    Diameter (cm)Depth (cm)Findings  +-----------------+-------------+----------+--------+  Shoulder             0.24                         +-----------------+-------------+----------+--------+  Mid upper arm         0.20                         +-----------------+-------------+----------+--------+  Dist upper arm       0.32                         +-----------------+-------------+----------+--------+  Antecubital fossa    0.25                         +-----------------+-------------+----------+--------+  Prox forearm         0.25                         +-----------------+-------------+----------+--------+  Mid forearm          0.30                         +-----------------+-------------+----------+--------+  Wrist                0.35                         +-----------------+-------------+----------+--------+   +-----------------+-------------+----------+---------+  Left Basilic     Diameter (cm)Depth (cm)Findings   +-----------------+-------------+----------+---------+  Mid upper arm        0.47                          +-----------------+-------------+----------+---------+  Dist upper arm       0.44                          +-----------------+-------------+----------+---------+  Antecubital fossa    0.41                          +-----------------+-------------+----------+---------+  Prox forearm         0.29               branching  +-----------------+-------------+----------+---------+   *See table(s) above for measurements and observations.       Diagnosing physician: Jamelle Haring  Electronically signed by Jamelle Haring on 02/16/2021 at 4:12:11 PM.  A high brachial bifurcation was noted on arterial duplex.  Yevonne Aline. Stanford Breed, MD [facility] Phone Number: [phone] 02/16/2021 4:26 PM  Total time spent on preparing this encounter including chart review, data review, collecting history, examining the patient, coordinating care for this new patient, 45 minutes.  Portions of this report may have been transcribed using voice recognition software.  Every effort has been made to ensure  accuracy; however, inadvertent computerized transcription errors may still be present.

## 2021-02-15 NOTE — Progress Notes (Signed)
Dizzy when he stands Has sore on left arm.

## 2021-02-15 NOTE — Progress Notes (Signed)
Subjective:  Patient ID: [name] Slade Sr., male    DOB: 1961/01/26  Age: 60 y.o. MRN: [id number]  CC: Diabetes   HPI [name] Shatto Sr. is a 60 y.o. year old male with a history of type 2 diabetes mellitus (A1c 7.1), Seizures, chronic kidney disease stage III-IV, Fibrillary glomerulonephritis,, alcohol abuse, hypertension, alcohol abuse, coronary artery calcifications, ACDF of C4,5 and C5,6 on 09/14/17  By Dr Saintclair Halsted secondary to Myelopathy seen for a follow-up visit.  Interval History: He smoked 3 packs of cig x20 years (60 pack years) and has recently cut back to 1 ppd.  He is interested in working on quitting. He complains of burning in his back and legs and this is uncontrolled on Lyrica. Previously could not tolerate Cymbalta and Gabapentin caused pedal edema.  He has a L arm skin lesion and is concerned this could be cancerous. Worked outside in the sun previously.  Diabetes is controlled with A1c of 7.1.  Compliant with his medications with no hypoglycemic symptoms.  He has had no recent seizures. 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/2019   On Rituximab for Fibrillary GN   Kidney disease    Myelopathy ([place]) 09/14/2017   Tobacco abuse 09/29/2017   Type 2 diabetes mellitus ([place]) 10/17/2011    Past Surgical History:  Procedure Laterality Date   ANTERIOR CERVICAL DECOMP/DISCECTOMY FUSION N/A 09/14/2017   Procedure: ANTERIOR CERVICAL DECOMPRESSION/DISCECTOMY FUSIONCERVICAL 4- CERVICAL 5, CERVICAL 5- CERVICAL 6;  Surgeon: Kary Kos, MD;  Location: [facility];   Service: Neurosurgery;  Laterality: N/A;  ANTERIOR CERVICAL DECOMPRESSION/DISCECTOMY FUSIONCERVICAL 4- CERVICAL 5, CERVICAL 5- CERVICAL 6    Family History  Problem Relation Age of Onset   Diabetes Mother    Scoliosis Mother    Kidney cancer Father    Lung cancer Father    Hypertension Brother    Colon cancer Neg Hx    Pancreatic cancer Neg Hx    Prostate cancer Neg Hx    Rectal cancer Neg Hx    Stomach cancer Neg Hx     No Known Allergies  Outpatient Medications Prior to Visit  Medication Sig Dispense Refill   albuterol (VENTOLIN HFA) 108 (90 Base) MCG/ACT inhaler INHALE 2 PUFFS INTO THE LUNGS EVERY 6 (SIX) HOURS AS NEEDED FOR WHEEZING OR SHORTNESS OF BREATH. 18 g 6   amLODipine (NORVASC) 10 MG tablet Take 1 tablet by mouth at bedtime 30 tablet 5   carvedilol (COREG) 3.125 MG tablet TAKE 1 TABLET BY MOUTH TWICE DAILY. 180 tablet 6   citalopram (CELEXA) 40 MG tablet Take 1 tablet (40 mg total) by mouth daily. 30 tablet 0   cloNIDine (CATAPRES) 0.2 MG tablet TAKE 1 TABLET (0.2 MG TOTAL) BY MOUTH 2 (TWO) TIMES DAILY. 60 tablet 0   ezetimibe (ZETIA) 10 MG tablet TAKE 1 TABLET (10 MG TOTAL) BY MOUTH DAILY. 30 tablet 0   fluticasone-salmeterol (ADVAIR) 250-50 MCG/ACT AEPB Inhale 1 puff into the lungs 2 (two) times daily. 60 each 6   furosemide (LASIX) 80 MG tablet Take 1 tablet by mouth  twice a day 180 tablet 3   hydrOXYzine (ATARAX/VISTARIL) 25 MG tablet TAKE 1 TABLET (25 MG TOTAL) BY MOUTH 3 (THREE) TIMES DAILY AS NEEDED FOR ANXIETY. 90 tablet 3   levETIRAcetam (KEPPRA) 500 MG tablet Take 1 tablet (500 mg total) by mouth 2 (two) times daily. 60 tablet 0   lisinopril (ZESTRIL) 40 MG tablet TAKE 1 TABLET BY MOUTH DAILY 90 tablet 3   pregabalin (LYRICA) 25 MG capsule Take 1 capsule (25 mg total) by mouth at bedtime. 30 capsule 8   sodium zirconium cyclosilicate (LOKELMA) 10 g PACK packet TAKE 1 PACKET BY MOUTH DAILY 30 each 3   Vitamin D, Ergocalciferol, (DRISDOL) 1.25 MG (50000 UNIT)  CAPS capsule 1 (ONE) CAPSULE BY MOUTH WEEKLY 17 capsule 3   rosuvastatin (CRESTOR) 10 MG tablet TAKE 1 TABLET (10 MG TOTAL) BY MOUTH DAILY. 30 tablet 0   cetirizine (ZYRTEC) 10 MG tablet Take 10 mg by mouth daily as needed for allergies.  (Patient not taking: No sig reported)     thiamine 100 MG tablet Take 1 tablet (100 mg total) by mouth daily. (Patient not taking: No sig reported) 30 tablet 0   vitamin B-12 1000 MCG tablet Take 1 tablet (1,000 mcg total) by mouth daily. (Patient not taking: No sig reported) 30 tablet 0   amLODipine (NORVASC) 10 MG tablet TAKE 1 TABLET BY MOUTH AT BEDTIME 30 tablet 7   amLODipine (NORVASC) 10 MG tablet TAKE 1 TABLET BY MOUTH AT BEDTIME 30 tablet 7   amLODipine (NORVASC) 5 MG tablet TAKE 1 TABLET (5 MG TOTAL) BY MOUTH DAILY. 30 tablet 2   carvedilol (COREG) 3.125 MG tablet Take 3.125 mg by mouth 2 (two) times daily with a meal.     furosemide (LASIX) 20 MG tablet Take 20 mg by mouth daily.     furosemide (LASIX) 20 MG tablet TAKE 1 TABLET BY MOUTH DAILY 60 tablet 3   furosemide (LASIX) 40 MG tablet TAKE 1 TABLET BY MOUTH DAILY 90 tablet 3   furosemide (LASIX) 40 MG tablet Take 1 tablet by mouth daily 90 tablet 3   furosemide (LASIX) 80 MG tablet Take 1 tablet by mouth daily 90 tablet 3   lisinopril (ZESTRIL) 40 MG tablet Take 40 mg by mouth daily.     pregabalin (LYRICA) 25 MG capsule TAKE 1 CAPSULE BY MOUTH EVERY NIGHT AT BEDTIME 30 capsule 2   No facility-administered medications prior to visit.     ROS Review of Systems  Constitutional:  Negative for activity change and appetite change.  HENT:  Negative for sinus pressure and sore throat.   Eyes:  Negative for visual disturbance.  Respiratory:  Negative for cough, chest tightness and shortness of breath.   Cardiovascular:  Negative for chest pain and leg swelling.  Gastrointestinal:  Negative for abdominal distention, abdominal pain, constipation and diarrhea.  Endocrine: Negative.   Genitourinary:   Negative for dysuria.  Musculoskeletal:  Negative for joint swelling and myalgias.  Skin:  Negative for rash.  Allergic/Immunologic: Negative.   Neurological:  Negative for weakness, light-headedness and numbness.  Psychiatric/Behavioral:  Negative for dysphoric mood and suicidal ideas.    Objective:  BP 97/63   Pulse (!) 52   Ht 6' (1.829 m)   Wt 227 lb (103 kg)   SpO2 98%   BMI 30.79 kg/m   BP/Weight 02/15/2021 06/18/2020 00/34/9179  Systolic BP 97 150 569  Diastolic BP 63 89 76  Wt. (Lbs) 227 221 228  BMI  30.79 29.56 30.5      Physical Exam Constitutional:      Appearance: He is well-developed.  Cardiovascular:     Rate and Rhythm: Normal rate.     Heart sounds: Normal heart sounds. No murmur heard. Pulmonary:     Effort: Pulmonary effort is normal.     Breath sounds: Normal breath sounds. No wheezing or rales.  Chest:     Chest wall: No tenderness.  Abdominal:     General: Bowel sounds are normal. There is no distension.     Palpations: Abdomen is soft. There is no mass.     Tenderness: There is no abdominal tenderness.  Musculoskeletal:        General: Normal range of motion.     Right lower leg: No edema.     Left lower leg: No edema.  Skin:    Comments: L arm with skin lesion with scabbing  Neurological:     Mental Status: He is alert and oriented to person, place, and time.  Psychiatric:        Mood and Affect: Mood normal.    CMP Latest Ref Rng & Units 10/14/2019 10/10/2019 10/07/2019  Glucose 65 - 99 mg/dL 134(H) 136(H) 143(H)  BUN 8 - 27 mg/dL 34(H) 42(H) 35(H)  Creatinine 0.76 - 1.27 mg/dL 2.26(H) 2.72(H) 2.40(H)  Sodium 134 - 144 mmol/L 136 136 139  Potassium 3.5 - 5.2 mmol/L 5.7(H) 6.2(H) 5.6(H)  Chloride 96 - 106 mmol/L 106 107(H) 105  CO2 20 - 29 mmol/L 18(L) 16(L) 15(L)  Calcium 8.6 - 10.2 mg/dL 9.0 9.1 9.3  Total Protein 6.0 - 8.5 g/dL - 5.4(L) 5.9(L)  Total Bilirubin 0.0 - 1.2 mg/dL - 0.2 0.3  Alkaline Phos 39 - 117 IU/L - 61 69  AST 0 -  40 IU/L - 13 18  ALT 0 - 44 IU/L - 14 15    Lipid Panel     Component Value Date/Time   CHOL 223 (H) 10/10/2019 1101   TRIG 105 10/10/2019 1101   HDL 63 10/10/2019 1101   CHOLHDL 3.5 10/10/2019 1101   LDLCALC 141 (H) 10/10/2019 1101    CBC    Component Value Date/Time   WBC 10.4 03/11/2019 0720   RBC 4.90 03/11/2019 0720   HGB 15.8 03/11/2019 0720   HCT 46.9 03/11/2019 0720   PLT 278 03/11/2019 0720   MCV 95.7 03/11/2019 0720   MCH 32.2 03/11/2019 0720   MCHC 33.7 03/11/2019 0720   RDW 12.2 03/11/2019 0720   LYMPHSABS 2.4 10/17/2017 1735   MONOABS 1.2 (H) 10/17/2017 1735   EOSABS 0.2 10/17/2017 1735   BASOSABS 0.0 10/17/2017 1735    Lab Results  Component Value Date   HGBA1C 7.1 (A) 02/15/2021    Assessment & Plan:  1. 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 Type 2 diabetes mellitus with other specified complication, without long-term current use of insulin (HCC) See #1 above - rosuvastatin (CRESTOR) 10 MG tablet; TAKE 1 TABLET (10 MG TOTAL) BY MOUTH DAILY.  Dispense: 90 tablet; Refill: 1  3. Skin lesion of left arm Suspicious for  malignancy Placed urgent referral - Ambulatory referral to Dermatology  4. Smoking greater than 30 pack years Greater than 30-pack-year history of smoking Spent 3 minutes counseling on smoking cessation and has adverse effect of smoking and he is willing to work on quitting - buPROPion (WELLBUTRIN XL) 150 MG 24 hr tablet; Take 1 tablet (150 mg total) by mouth daily.  Dispense: 30 tablet; Refill: 2 - CT  CHEST LUNG CA SCREEN LOW DOSE W/O CM; Future  5. Neuropathy Uncontrolled on Lyrica Advised to discuss Lyrica with his pain doctor for possible increase in dose Previously unable to tolerate Cymbalta or gabapentin We will add on Lidoderm patches - lidocaine (LIDODERM) 5 %; Place 1 patch onto the skin daily. Remove & Discard patch within 12 hours or as directed by MD  Dispense: 30 patch; Refill: 3  6. Need for pneumococcal vaccine - Pneumococcal conjugate vaccine 20-valent  Health Care Maintenance: Shingrix at next visit Meds ordered this encounter  Medications   rosuvastatin (CRESTOR) 10 MG tablet    Sig: TAKE 1 TABLET (10 MG TOTAL) BY MOUTH DAILY.    Dispense:  90 tablet    Refill:  1    Please arrange an appt with Dr. Bettina Gavia for further refills. 1rs attempt   buPROPion (WELLBUTRIN XL) 150 MG 24 hr tablet    Sig: Take 1 tablet (150 mg total) by mouth daily.    Dispense:  30 tablet    Refill:  2   lidocaine (LIDODERM) 5 %    Sig: Place 1 patch onto the skin daily. Remove & Discard patch within 12 hours or as directed by MD    Dispense:  30 patch    Refill:  3    Follow-up: Return in about 3 months (around 05/18/2021) for Chronic disease management.       Charlott Rakes, MD, FAAFP. [facility] and [place], [place]   02/15/2021, 1:00 PM

## 2021-02-16 ENCOUNTER — Ambulatory Visit (INDEPENDENT_AMBULATORY_CARE_PROVIDER_SITE_OTHER)
Admission: RE | Admit: 2021-02-16 | Discharge: 2021-02-16 | Disposition: A | Discharge status: Home / Self Care | Payer: Medicare Other | Source: Ambulatory Visit | Attending: Vascular Surgery | Admitting: Vascular Surgery

## 2021-02-16 ENCOUNTER — Other Ambulatory Visit: Payer: Self-pay

## 2021-02-16 ENCOUNTER — Ambulatory Visit (INDEPENDENT_AMBULATORY_CARE_PROVIDER_SITE_OTHER): Payer: Medicare Other | Admitting: Vascular Surgery

## 2021-02-16 ENCOUNTER — Encounter: Payer: Self-pay | Admitting: Vascular Surgery

## 2021-02-16 ENCOUNTER — Ambulatory Visit (HOSPITAL_COMMUNITY)
Admission: RE | Admit: 2021-02-16 | Discharge: 2021-02-16 | Disposition: A | Discharge status: Home / Self Care | Payer: Medicare Other | Source: Ambulatory Visit | Attending: Vascular Surgery | Admitting: Vascular Surgery

## 2021-02-16 VITALS — BP 102/69 | HR 55 | Temp 98.2°F | Resp 20 | Ht 72.0 in | Wt 223.0 lb

## 2021-02-16 DIAGNOSIS — N185 Chronic kidney disease, stage 5: Secondary | ICD-10-CM

## 2021-02-16 DIAGNOSIS — N189 Chronic kidney disease, unspecified: Secondary | ICD-10-CM | POA: Diagnosis present

## 2021-02-17 ENCOUNTER — Ambulatory Visit: Payer: Medicare Other | Attending: Family Medicine

## 2021-02-17 ENCOUNTER — Other Ambulatory Visit: Payer: Self-pay

## 2021-02-17 DIAGNOSIS — N185 Chronic kidney disease, stage 5: Secondary | ICD-10-CM

## 2021-02-18 LAB — CMP14+EGFR
ALT: 17 IU/L (ref 0–44)
AST: 17 IU/L (ref 0–40)
Albumin/Globulin Ratio: 1.9 (ref 1.2–2.2)
Albumin: 3.5 g/dL — ABNORMAL LOW (ref 3.8–4.8)
Alkaline Phosphatase: 63 IU/L (ref 44–121)
BUN/Creatinine Ratio: 15 (ref 10–24)
BUN: 77 mg/dL (ref 8–27)
Bilirubin Total: 0.2 mg/dL (ref 0.0–1.2)
CO2: 15 mmol/L — ABNORMAL LOW (ref 20–29)
Calcium: 8.7 mg/dL (ref 8.6–10.2)
Chloride: 96 mmol/L (ref 96–106)
Creatinine, Ser: 5.28 mg/dL — ABNORMAL HIGH (ref 0.76–1.27)
Globulin, Total: 1.8 g/dL (ref 1.5–4.5)
Glucose: 121 mg/dL — ABNORMAL HIGH (ref 65–99)
Potassium: 5.4 mmol/L — ABNORMAL HIGH (ref 3.5–5.2)
Sodium: 133 mmol/L — ABNORMAL LOW (ref 134–144)
Total Protein: 5.3 g/dL — ABNORMAL LOW (ref 6.0–8.5)
eGFR: 12 mL/min/{1.73_m2} — ABNORMAL LOW (ref >59)

## 2021-02-18 LAB — LIPID PANEL
Chol/HDL Ratio: 3.6 ratio (ref 0.0–5.0)
Cholesterol, Total: 169 mg/dL (ref 100–199)
HDL: 47 mg/dL (ref >39)
LDL Chol Calc (NIH): 101 mg/dL — ABNORMAL HIGH (ref 0–99)
Triglycerides: 115 mg/dL (ref 0–149)
VLDL Cholesterol Cal: 21 mg/dL (ref 5–40)

## 2021-02-22 ENCOUNTER — Other Ambulatory Visit: Payer: Self-pay

## 2021-02-22 ENCOUNTER — Ambulatory Visit (HOSPITAL_COMMUNITY)
Admission: RE | Admit: 2021-02-22 | Discharge: 2021-02-22 | Disposition: A | Discharge status: Home / Self Care | Payer: Medicare Other | Source: Ambulatory Visit | Attending: Family Medicine | Admitting: Family Medicine

## 2021-02-22 DIAGNOSIS — F17210 Nicotine dependence, cigarettes, uncomplicated: Secondary | ICD-10-CM | POA: Insufficient documentation

## 2021-02-26 ENCOUNTER — Ambulatory Visit: Payer: Medicare Other | Admitting: Cardiology

## 2021-03-04 ENCOUNTER — Other Ambulatory Visit: Payer: Self-pay

## 2021-03-04 ENCOUNTER — Encounter (HOSPITAL_COMMUNITY): Payer: Self-pay | Admitting: Vascular Surgery

## 2021-03-04 NOTE — Progress Notes (Signed)
Spoke to pt pre-op instructions given.

## 2021-03-04 NOTE — Anesthesia Preprocedure Evaluation (Addendum)
Anesthesia Evaluation  Patient identified by MRN, date of birth, ID band Patient awake    Reviewed: Allergy & Precautions, NPO status , Patient's Chart, lab work & pertinent test results  Airway Mallampati: II  TM Distance: >3 FB Neck ROM: Full    Dental no notable dental hx. (+) Teeth Intact, Dental Advisory Given   Pulmonary COPD,  COPD inhaler, Current SmokerPatient did not abstain from smoking.,    Pulmonary exam normal breath sounds clear to auscultation       Cardiovascular hypertension, + CAD and +CHF  Normal cardiovascular exam Rhythm:Regular Rate:Normal     Neuro/Psych Anxiety    GI/Hepatic   Endo/Other  diabetes  Renal/GU CRFRenal disease     Musculoskeletal  (+) Arthritis ,   Abdominal   Peds  Hematology   Anesthesia Other Findings   Reproductive/Obstetrics                           Anesthesia Physical Anesthesia Plan  ASA: 4  Anesthesia Plan: Regional   Post-op Pain Management:    Induction:   PONV Risk Score and Plan: 1 and Treatment may vary due to age or medical condition, Ondansetron, Propofol infusion and Midazolam  Airway Management Planned: Natural Airway  Additional Equipment: None  Intra-op Plan:   Post-operative Plan:   Informed Consent: I have reviewed the patients History and Physical, chart, labs and discussed the procedure including the risks, benefits and alternatives for the proposed anesthesia with the patient or authorized representative who has indicated his/her understanding and acceptance.     Dental advisory given  Plan Discussed with: CRNA  Anesthesia Plan Comments: (See APP note by Durel Salts, FNP   L supra clavicular block)      Anesthesia Quick Evaluation

## 2021-03-04 NOTE — Progress Notes (Signed)
Anesthesia Chart Review:  Pt is a same day work up    Case: [id number] Date/Time: 03/05/21 0945   Procedure: LEFT UPPER EXTREMITY ARTERIOVENOUS (AV) FISTULA CREATION (Left) - PERIPHERAL NERVE BLOCK   Anesthesia type: Monitor Anesthesia Care   Pre-op diagnosis: CKD V   Location: [facility] OR ROOM 11 / [facility]   Surgeons: [name], MD       DISCUSSION: Pt is 60 years old with hx CAD (calcifications on CT), CHF, heart murmur, HTN, DM, COPD, CKD (stage V)  PROVIDERS: - PCP is Charlott Rakes, MD - Cardiologist is Shirlee More, MD. Last office visit 09/02/19 - Nephrologist is Elmarie Shiley, MD who referred pt for surgery  LABS: Will be obtained day of surgery      IMAGES: CT chest lung cancer 02/23/21:  1. Lung-RADS 1, negative. Continue annual screening with low-dose chest CT without contrast in 12 months. 2. Coronary artery calcifications. 3. Emphysema and aortic atherosclerosis.   EKG 09/02/19: sinus rhythm with 1st degree AV block   CV: Nuclear stress test 10/24/17:  Nuclear stress EF: 50%. There was no ST segment deviation noted during stress. The study is normal. This is a low risk study. The left ventricular ejection fraction is mildly decreased (45-54%).  Echo 10/24/17:  - Left ventricle: The cavity size was normal. Systolic function was normal. The estimated ejection fraction was in the range of 55% to 60%. Wall motion was normal; there were no regional wall motion abnormalities. The study is not technically sufficient to allow evaluation of LV diastolic function.  - Aortic valve: Transvalvular velocity was within the normal range. There was no stenosis. There was no regurgitation.  - Mitral valve: Transvalvular velocity was within the normal range. There was no evidence for stenosis. There was trivial regurgitation.  - Right ventricle: The cavity size was normal. Wall thickness was normal. Systolic function was normal.  - Tricuspid valve: There was trivial regurgitation.  -  Pulmonary arteries: Systolic pressure was within the normal range. PA peak pressure: 34 mm Hg (S).     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/21/2019   On Rituximab for Fibrillary GN   Kidney disease    Myelopathy ([place]) 09/14/2017   Tobacco abuse 09/29/2017   Type 2 diabetes mellitus ([place]) 10/17/2011    Past Surgical History:  Procedure Laterality Date   ANTERIOR CERVICAL DECOMP/DISCECTOMY FUSION N/A 09/14/2017   Procedure: ANTERIOR CERVICAL DECOMPRESSION/DISCECTOMY FUSIONCERVICAL 4- CERVICAL 5, CERVICAL 5- CERVICAL 6;  Surgeon: Kary Kos, MD;  Location: [facility];  Service: Neurosurgery;  Laterality: N/A;  ANTERIOR CERVICAL DECOMPRESSION/DISCECTOMY FUSIONCERVICAL 4- CERVICAL 5, CERVICAL 5- CERVICAL 6    MEDICATIONS: No current facility-administered medications for this encounter.    albuterol (VENTOLIN HFA) 108 (90 Base) MCG/ACT inhaler   amLODipine (NORVASC) 10 MG tablet   buPROPion (WELLBUTRIN XL) 150 MG 24 hr tablet   carvedilol (COREG) 3.125 MG tablet   cetirizine (ZYRTEC) 10 MG tablet   citalopram (CELEXA) 40 MG tablet   cloNIDine (CATAPRES) 0.2 MG tablet   ezetimibe (ZETIA) 10 MG tablet   fluticasone-salmeterol (  ADVAIR) 250-50 MCG/ACT AEPB   furosemide (LASIX) 80 MG tablet   hydrOXYzine (ATARAX/VISTARIL) 25 MG tablet   levETIRAcetam (KEPPRA) 500 MG tablet   lidocaine (LIDODERM) 5 %   lisinopril (ZESTRIL) 40 MG tablet   pregabalin (LYRICA) 25 MG capsule   rosuvastatin (CRESTOR) 10 MG tablet   sodium zirconium cyclosilicate (LOKELMA) 10 g PACK packet   thiamine 100 MG tablet   vitamin B-12 1000 MCG  tablet   Vitamin D, Ergocalciferol, (DRISDOL) 1.25 MG (50000 UNIT) CAPS capsule    If labs acceptable day of surgery, I anticipate pt can proceed with surgery as scheduled.  Willeen Cass, PhD, FNP-BC [facility]/Anesthesiology Phone: [phone] 03/04/2021 1:50 PM

## 2021-03-05 ENCOUNTER — Ambulatory Visit (HOSPITAL_COMMUNITY): Payer: Medicare Other | Admitting: Emergency Medicine

## 2021-03-05 ENCOUNTER — Encounter (HOSPITAL_COMMUNITY)
Admission: RE | Disposition: A | Discharge status: Home / Self Care | Payer: Self-pay | Source: Home / Self Care | Attending: Vascular Surgery

## 2021-03-05 ENCOUNTER — Encounter (HOSPITAL_COMMUNITY): Payer: Self-pay | Admitting: Vascular Surgery

## 2021-03-05 ENCOUNTER — Ambulatory Visit (HOSPITAL_COMMUNITY)
Admission: RE | Admit: 2021-03-05 | Discharge: 2021-03-05 | Disposition: A | Discharge status: Home / Self Care | Payer: Medicare Other | Attending: Vascular Surgery | Admitting: Vascular Surgery

## 2021-03-05 DIAGNOSIS — I509 Heart failure, unspecified: Secondary | ICD-10-CM | POA: Diagnosis not present

## 2021-03-05 DIAGNOSIS — N185 Chronic kidney disease, stage 5: Secondary | ICD-10-CM | POA: Diagnosis present

## 2021-03-05 DIAGNOSIS — Z7951 Long term (current) use of inhaled steroids: Secondary | ICD-10-CM | POA: Insufficient documentation

## 2021-03-05 DIAGNOSIS — E1122 Type 2 diabetes mellitus with diabetic chronic kidney disease: Secondary | ICD-10-CM | POA: Insufficient documentation

## 2021-03-05 DIAGNOSIS — Z8249 Family history of ischemic heart disease and other diseases of the circulatory system: Secondary | ICD-10-CM | POA: Insufficient documentation

## 2021-03-05 DIAGNOSIS — F17210 Nicotine dependence, cigarettes, uncomplicated: Secondary | ICD-10-CM | POA: Diagnosis not present

## 2021-03-05 DIAGNOSIS — N058 Unspecified nephritic syndrome with other morphologic changes: Secondary | ICD-10-CM | POA: Diagnosis not present

## 2021-03-05 DIAGNOSIS — Z833 Family history of diabetes mellitus: Secondary | ICD-10-CM | POA: Diagnosis not present

## 2021-03-05 DIAGNOSIS — I132 Hypertensive heart and chronic kidney disease with heart failure and with stage 5 chronic kidney disease, or end stage renal disease: Secondary | ICD-10-CM | POA: Diagnosis not present

## 2021-03-05 DIAGNOSIS — Z79899 Other long term (current) drug therapy: Secondary | ICD-10-CM | POA: Diagnosis not present

## 2021-03-05 HISTORY — DX: Heart failure, unspecified: I50.9

## 2021-03-05 HISTORY — DX: Chronic kidney disease, stage 5: N18.5

## 2021-03-05 HISTORY — PX: AV FISTULA PLACEMENT: SHX1204

## 2021-03-05 HISTORY — DX: Cardiac murmur, unspecified: R01.1

## 2021-03-05 HISTORY — DX: Unspecified osteoarthritis, unspecified site: M19.90

## 2021-03-05 LAB — POCT I-STAT, CHEM 8
BUN: 109 mg/dL — ABNORMAL HIGH (ref 8–23)
Calcium, Ion: 1.16 mmol/L (ref 1.15–1.40)
Chloride: 99 mmol/L (ref 98–111)
Creatinine, Ser: 7.4 mg/dL — ABNORMAL HIGH (ref 0.61–1.24)
Glucose, Bld: 160 mg/dL — ABNORMAL HIGH (ref 70–99)
HCT: 34 % — ABNORMAL LOW (ref 39.0–52.0)
Hemoglobin: 11.6 g/dL — ABNORMAL LOW (ref 13.0–17.0)
Potassium: 4.6 mmol/L (ref 3.5–5.1)
Sodium: 128 mmol/L — ABNORMAL LOW (ref 135–145)
TCO2: 20 mmol/L — ABNORMAL LOW (ref 22–32)

## 2021-03-05 LAB — GLUCOSE, CAPILLARY
Glucose-Capillary: 178 mg/dL — ABNORMAL HIGH (ref 70–99)
Glucose-Capillary: 171 mg/dL — ABNORMAL HIGH (ref 70–99)

## 2021-03-05 SURGERY — ARTERIOVENOUS (AV) FISTULA CREATION
Anesthesia: Regional | Site: Arm Upper | Laterality: Left

## 2021-03-05 MED ORDER — PROPOFOL 10 MG/ML IV BOLUS
INTRAVENOUS | Status: DC | PRN
  Administered 2021-03-05: 20 mg via INTRAVENOUS

## 2021-03-05 MED ORDER — HEPARIN 6000 UNIT IRRIGATION SOLUTION
Status: AC
  Filled 2021-03-05: qty 500

## 2021-03-05 MED ORDER — MIDAZOLAM HCL 2 MG/2ML IJ SOLN: 1.0000 mg | Freq: Once | INTRAMUSCULAR | Status: AC

## 2021-03-05 MED ORDER — FENTANYL CITRATE (PF) 250 MCG/5ML IJ SOLN
INTRAMUSCULAR | Status: AC
  Filled 2021-03-05: qty 5

## 2021-03-05 MED ORDER — ROCURONIUM BROMIDE 10 MG/ML (PF) SYRINGE
PREFILLED_SYRINGE | INTRAVENOUS | Status: AC
  Filled 2021-03-05: qty 20

## 2021-03-05 MED ORDER — ACETAMINOPHEN 10 MG/ML IV SOLN: 1000.0000 mg | Freq: Once | INTRAVENOUS | Status: DC | PRN

## 2021-03-05 MED ORDER — CALCIUM CHLORIDE 10 % IV SOLN
INTRAVENOUS | Status: AC
  Filled 2021-03-05: qty 10

## 2021-03-05 MED ORDER — CLONIDINE HCL (ANALGESIA) 100 MCG/ML EP SOLN
EPIDURAL | Status: DC | PRN
  Administered 2021-03-05: 100 ug

## 2021-03-05 MED ORDER — FENTANYL CITRATE (PF) 100 MCG/2ML IJ SOLN
INTRAMUSCULAR | Status: DC | PRN
  Administered 2021-03-05: 50 ug via INTRAVENOUS

## 2021-03-05 MED ORDER — CHLORHEXIDINE GLUCONATE 4 % EX LIQD: 60.0000 mL | Freq: Once | CUTANEOUS | Status: DC

## 2021-03-05 MED ORDER — PROPOFOL 10 MG/ML IV BOLUS
INTRAVENOUS | Status: AC
  Filled 2021-03-05: qty 20

## 2021-03-05 MED ORDER — PHENYLEPHRINE 40 MCG/ML (10ML) SYRINGE FOR IV PUSH (FOR BLOOD PRESSURE SUPPORT)
PREFILLED_SYRINGE | INTRAVENOUS | Status: AC
  Filled 2021-03-05: qty 10

## 2021-03-05 MED ORDER — HYDROCODONE-ACETAMINOPHEN 5-325 MG PO TABS: 1.0000 | ORAL_TABLET | Freq: Four times a day (QID) | ORAL | 0 refills | Status: DC | PRN

## 2021-03-05 MED ORDER — OXYCODONE HCL 5 MG PO TABS: 5.0000 mg | ORAL_TABLET | Freq: Once | ORAL | Status: DC | PRN

## 2021-03-05 MED ORDER — HEPARIN SODIUM (PORCINE) 1000 UNIT/ML IJ SOLN
INTRAMUSCULAR | Status: DC | PRN
  Administered 2021-03-05: 5000 [IU] via INTRAVENOUS

## 2021-03-05 MED ORDER — AMISULPRIDE (ANTIEMETIC) 5 MG/2ML IV SOLN
10.0000 mg | Freq: Once | INTRAVENOUS | Status: DC | PRN
Start: 2021-03-05 — End: 2021-03-07

## 2021-03-05 MED ORDER — PROPOFOL 500 MG/50ML IV EMUL
INTRAVENOUS | Status: DC | PRN
  Administered 2021-03-05: 100 ug/kg/min via INTRAVENOUS

## 2021-03-05 MED ORDER — LIDOCAINE-EPINEPHRINE (PF) 1 %-1:200000 IJ SOLN
INTRAMUSCULAR | Status: AC
  Filled 2021-03-05: qty 30

## 2021-03-05 MED ORDER — CHLORHEXIDINE GLUCONATE 0.12 % MT SOLN
OROMUCOSAL | Status: AC
  Administered 2021-03-05: 15 mL via OROMUCOSAL
  Filled 2021-03-05: qty 15

## 2021-03-05 MED ORDER — PAPAVERINE HCL 30 MG/ML IJ SOLN
INTRAMUSCULAR | Status: AC
  Filled 2021-03-05: qty 2

## 2021-03-05 MED ORDER — OXYCODONE HCL 5 MG/5ML PO SOLN: 5.0000 mg | Freq: Once | ORAL | Status: DC | PRN

## 2021-03-05 MED ORDER — CEFAZOLIN SODIUM-DEXTROSE 2-4 GM/100ML-% IV SOLN
2.0000 g | INTRAVENOUS | Status: AC
  Administered 2021-03-05: 2 g via INTRAVENOUS
  Filled 2021-03-05: qty 100

## 2021-03-05 MED ORDER — ONDANSETRON HCL 4 MG/2ML IJ SOLN
4.0000 mg | Freq: Once | INTRAMUSCULAR | Status: DC | PRN
Start: 2021-03-05 — End: 2021-03-07

## 2021-03-05 MED ORDER — LACTATED RINGERS IV SOLN: INTRAVENOUS | Status: DC

## 2021-03-05 MED ORDER — HEPARIN 6000 UNIT IRRIGATION SOLUTION
Status: DC | PRN
  Administered 2021-03-05: 1

## 2021-03-05 MED ORDER — ONDANSETRON HCL 4 MG/2ML IJ SOLN
INTRAMUSCULAR | Status: AC
  Filled 2021-03-05: qty 2

## 2021-03-05 MED ORDER — HEPARIN SODIUM (PORCINE) 1000 UNIT/ML IJ SOLN
INTRAMUSCULAR | Status: AC
  Filled 2021-03-05: qty 2

## 2021-03-05 MED ORDER — HYDROMORPHONE HCL 1 MG/ML IJ SOLN: 0.2500 mg | INTRAMUSCULAR | Status: DC | PRN

## 2021-03-05 MED ORDER — MIDAZOLAM HCL 2 MG/2ML IJ SOLN
INTRAMUSCULAR | Status: AC
  Administered 2021-03-05: 1 mg via INTRAVENOUS
  Filled 2021-03-05: qty 2

## 2021-03-05 MED ORDER — FENTANYL CITRATE (PF) 100 MCG/2ML IJ SOLN
INTRAMUSCULAR | Status: AC
  Administered 2021-03-05: 50 ug via INTRAVENOUS
  Filled 2021-03-05: qty 2

## 2021-03-05 MED ORDER — 0.9 % SODIUM CHLORIDE (POUR BTL) OPTIME
TOPICAL | Status: DC | PRN
  Administered 2021-03-05: 1000 mL

## 2021-03-05 MED ORDER — ROPIVACAINE HCL 5 MG/ML IJ SOLN
INTRAMUSCULAR | Status: DC | PRN
  Administered 2021-03-05: 30 mL via PERINEURAL

## 2021-03-05 MED ORDER — ONDANSETRON HCL 4 MG/2ML IJ SOLN
INTRAMUSCULAR | Status: DC | PRN
  Administered 2021-03-05: 4 mg via INTRAVENOUS

## 2021-03-05 MED ORDER — CHLORHEXIDINE GLUCONATE 0.12 % MT SOLN: 15.0000 mL | Freq: Once | OROMUCOSAL | Status: AC

## 2021-03-05 MED ORDER — ORAL CARE MOUTH RINSE: 15.0000 mL | Freq: Once | OROMUCOSAL | Status: AC

## 2021-03-05 MED ORDER — SODIUM CHLORIDE 0.9 % IV SOLN: INTRAVENOUS | Status: DC

## 2021-03-05 MED ORDER — FENTANYL CITRATE (PF) 100 MCG/2ML IJ SOLN: 50.0000 ug | Freq: Once | INTRAMUSCULAR | Status: AC

## 2021-03-05 MED ORDER — CEFAZOLIN SODIUM 1 G IJ SOLR
INTRAMUSCULAR | Status: AC
  Filled 2021-03-05: qty 20

## 2021-03-05 MED ORDER — LIDOCAINE 2% (20 MG/ML) 5 ML SYRINGE
INTRAMUSCULAR | Status: DC | PRN
  Administered 2021-03-05: 60 mg via INTRAVENOUS

## 2021-03-05 MED ORDER — PROPOFOL 1000 MG/100ML IV EMUL
INTRAVENOUS | Status: AC
  Filled 2021-03-05: qty 100

## 2021-03-05 SURGICAL SUPPLY — 37 items
ADH SKN CLS APL DERMABOND .7 (GAUZE/BANDAGES/DRESSINGS) ×1
APL PRP STRL LF DISP 70% ISPRP (MISCELLANEOUS) ×1
APL SKNCLS STERI-STRIP NONHPOA (GAUZE/BANDAGES/DRESSINGS)
ARMBAND PINK RESTRICT EXTREMIT (MISCELLANEOUS) ×2 IMPLANT
BENZOIN TINCTURE PRP APPL 2/3 (GAUZE/BANDAGES/DRESSINGS) IMPLANT
CANISTER SUCT 3000ML PPV (MISCELLANEOUS) ×2 IMPLANT
CANNULA VESSEL 3MM 2 BLNT TIP (CANNULA) ×2 IMPLANT
CHLORAPREP W/TINT 26 (MISCELLANEOUS) ×2 IMPLANT
CLIP VESOCCLUDE MED 6/CT (CLIP) ×2 IMPLANT
CLIP VESOCCLUDE SM WIDE 6/CT (CLIP) ×2 IMPLANT
COVER PROBE W GEL 5X96 (DRAPES) IMPLANT
DERMABOND ADVANCED (GAUZE/BANDAGES/DRESSINGS) ×1
DERMABOND ADVANCED .7 DNX12 (GAUZE/BANDAGES/DRESSINGS) IMPLANT
ELECT REM PT RETURN 9FT ADLT (ELECTROSURGICAL) ×2
ELECTRODE REM PT RTRN 9FT ADLT (ELECTROSURGICAL) ×1 IMPLANT
GLOVE SURG POLYISO LF SZ8 (GLOVE) ×2 IMPLANT
GOWN STRL REUS W/ TWL LRG LVL3 (GOWN DISPOSABLE) ×2 IMPLANT
GOWN STRL REUS W/ TWL XL LVL3 (GOWN DISPOSABLE) ×1 IMPLANT
GOWN STRL REUS W/TWL LRG LVL3 (GOWN DISPOSABLE) ×4
GOWN STRL REUS W/TWL XL LVL3 (GOWN DISPOSABLE) ×2
INSERT FOGARTY SM (MISCELLANEOUS) IMPLANT
KIT BASIN OR (CUSTOM PROCEDURE TRAY) ×2 IMPLANT
KIT TURNOVER KIT B (KITS) ×2 IMPLANT
NDL 18GX1X1/2 (RX/OR ONLY) (NEEDLE) IMPLANT
NEEDLE 18GX1X1/2 (RX/OR ONLY) (NEEDLE) IMPLANT
NS IRRIG 1000ML POUR BTL (IV SOLUTION) ×2 IMPLANT
PACK CV ACCESS (CUSTOM PROCEDURE TRAY) ×2 IMPLANT
PAD ARMBOARD 7.5X6 YLW CONV (MISCELLANEOUS) ×4 IMPLANT
STRIP CLOSURE SKIN 1/2X4 (GAUZE/BANDAGES/DRESSINGS) ×2 IMPLANT
SUT MNCRL AB 4-0 PS2 18 (SUTURE) ×2 IMPLANT
SUT PROLENE 6 0 BV (SUTURE) ×2 IMPLANT
SUT VIC AB 3-0 SH 27 (SUTURE) ×2
SUT VIC AB 3-0 SH 27X BRD (SUTURE) ×1 IMPLANT
SYR 3ML LL SCALE MARK (SYRINGE) IMPLANT
TOWEL GREEN STERILE (TOWEL DISPOSABLE) ×2 IMPLANT
UNDERPAD 30X36 HEAVY ABSORB (UNDERPADS AND DIAPERS) ×2 IMPLANT
WATER STERILE IRR 1000ML POUR (IV SOLUTION) ×2 IMPLANT

## 2021-03-05 NOTE — Interval H&P Note (Signed)
History and Physical Interval Note:  03/05/2021 10:20 AM  [name] Slade Sr.  has presented today for surgery, with the diagnosis of CKD V.  The various methods of treatment have been discussed with the patient and family. After consideration of risks, benefits and other options for treatment, the patient has consented to  Procedure(s) with comments: LEFT UPPER EXTREMITY ARTERIOVENOUS (AV) FISTULA CREATION (Left) - PERIPHERAL NERVE BLOCK as a surgical intervention.  The patient's history has been reviewed, patient examined, no change in status, stable for surgery.  I have reviewed the patient's chart and labs.  Questions were answered to the patient's satisfaction.     [name]

## 2021-03-05 NOTE — Transfer of Care (Signed)
Immediate Anesthesia Transfer of Care Note  Patient: [name] Slade Sr.  Procedure(s) Performed: LEFT UPPER EXTREMITY RADIOBASILIC  ARTERIOVENOUS (AV) FISTULA CREATION (Left: Arm Upper)  Patient Location: PACU  Anesthesia Type:MAC combined with regional for post-op pain  Level of Consciousness: awake and drowsy  Airway & Oxygen Therapy: Patient Spontanous Breathing and Patient connected to face mask oxygen  Post-op Assessment: Report given to RN and Post -op Vital signs reviewed and stable  Post vital signs: Reviewed and stable  Last Vitals:  Vitals Value Taken Time  BP 95/62 03/05/21 1140  Temp    Pulse 53 03/05/21 1142  Resp 12 03/05/21 1142  SpO2 99 % 03/05/21 1142  Vitals shown include unvalidated device data.  Last Pain:  Vitals:   03/05/21 0812  TempSrc:   PainSc: 5       Patients Stated Pain Goal: 3 (45/03/88 8280)  Complications: No notable events documented.

## 2021-03-05 NOTE — Anesthesia Procedure Notes (Addendum)
Anesthesia Regional Block: Supraclavicular block   Pre-Anesthetic Checklist: , timeout performed,  Correct Patient, Correct Site, Correct Laterality,  Correct Procedure, Correct Position, site marked,  Risks and benefits discussed,  Surgical consent,  Pre-op evaluation,  At surgeon's request and post-op pain management  Laterality: Left and Upper  Prep: chloraprep       Needles:  Injection technique: Single-shot  Needle Type: Echogenic Needle     Needle Length: 5cm  Needle Gauge: 21     Additional Needles:   Procedures:,,,, ultrasound used (permanent image in chart),,    Narrative:  Start time: 03/05/2021 8:48 AM End time: 03/05/2021 8:55 AM Injection made incrementally with aspirations every 5 mL.  Performed by: Personally  Anesthesiologist: Barnet Glasgow, MD

## 2021-03-05 NOTE — Op Note (Signed)
DATE OF SERVICE: 03/05/2021  PATIENT:  [name] Slade Sr.  60 y.o. male  PRE-OPERATIVE DIAGNOSIS:  CKD V   POST-OPERATIVE DIAGNOSIS:  Same  PROCEDURE:   Left radio-basilic first stage arteriovenous fistula  SURGEON:  Surgeon(s) and Role:    * [name], MD - Primary  ASSISTANT: Delena Serve, RNFA  An assistant was required to facilitate exposure and expedite the case.  ANESTHESIA:   regional and MAC  EBL: min  BLOOD ADMINISTERED:none  DRAINS: none   LOCAL MEDICATIONS USED:  NONE  SPECIMEN:  none  COUNTS: confirmed correct.  TOURNIQUET:  none  PATIENT DISPOSITION:  PACU - hemodynamically stable.   Delay start of Pharmacological VTE agent (>24hrs) due to surgical blood loss or risk of bleeding: no  INDICATION FOR PROCEDURE: [name] Olivo Sr. is a 60 y.o. male with CKD nearing ESRD in need of hemodialysis access. After careful discussion of risks, benefits, and alternatives the patient was offered left basilic vein fistula. We specifically discussed requirement for second, revisional surgery.  We also discussed risk of steal syndrome. The patient understood and wished to proceed.  OPERATIVE FINDINGS: High bifurcation of the brachial artery.  The radial artery with the more superficial artery and used for AV fistula creation.  Otherwise unremarkable for stage basilic arteriovenous fistula.  DESCRIPTION OF PROCEDURE: After identification of the patient in the pre-operative holding area, the patient was transferred to the operating room. The patient was positioned supine on the operating room table. Anesthesia was induced. The left arm was prepped and draped in standard fashion. A surgical pause was performed confirming correct patient, procedure, and operative location.  Using intraoperative ultrasound the brachial artery and basilic vein were mapped.  A high bifurcation of the brachial artery was noted.  The radial artery appeared to be the superficial artery about  the antecubitum.  A curvilinear incision was planned over the course of the two vessels to allow fistula creation.  Incision was created.  Incision was carried down through subcutaneous tissue.  The aponeurosis of the biceps tendon was divided.  The brachial sheath was identified.  The brachial artery was skeletonized.  The artery was encircled with 2 Silastic Vesseloops.  Next attention was turned to the basilic vein.  This was identified in the medial arm in its typical position.  The vein was mobilized throughout the length of the incision to allow tension-free arteriovenous fistula creation.  The distal end of the vein was clamped with a right angle.  The proximal end of the vein was clamped with a bulldog.  The vein was transected distally.  The stump was oversewn with a 2-0 silk.  The cut end of the vein was spatulated and distended with a mosquito clamp.  Patient was systemically heparinized with 5000 units of IV heparin.  After a three minute pause, the brachial artery was clamped proximally distally.  The basilic vein was anastomosed to the brachial artery into side using continuous running suture of 6-0 Prolene.  Immediately prior to completion the anastomosis was flushed and de-aired.  The anastomosis was completed.  Clamps were released.  Hemostasis was achieved.  An audible bruit was heard in the fistula.  Doppler flow was noted in the radial artery at the wrist.  Hemostasis was achieved in the surgical bed.  The wound was closed with 3-0 Vicryl and 4-0 Monocryl.  Upon completion of the case instrument and sharps counts were confirmed correct. The patient was transferred to the PACU in good condition. I was  present for all portions of the procedure.  Yevonne Aline. Stanford Breed, MD [facility] Phone Number: [phone] 03/05/2021 11:31 AM

## 2021-03-05 NOTE — Progress Notes (Signed)
Pt did not take coreg this morning. HR ranging from 55-60. BP 100/58. Dr. Valma Cava made aware. No new orders at this time.  Jacqlyn Larsen, RN

## 2021-03-05 NOTE — Anesthesia Postprocedure Evaluation (Signed)
Anesthesia Post Note  Patient: [name] Rudin Sr.  Procedure(s) Performed: LEFT UPPER EXTREMITY RADIOBASILIC  ARTERIOVENOUS (AV) FISTULA CREATION (Left: Arm Upper)     Patient location during evaluation: PACU Anesthesia Type: Regional Level of consciousness: awake and alert Pain management: pain level controlled Vital Signs Assessment: post-procedure vital signs reviewed and stable Respiratory status: spontaneous breathing, nonlabored ventilation, respiratory function stable and patient connected to nasal cannula oxygen Cardiovascular status: stable and blood pressure returned to baseline Postop Assessment: no apparent nausea or vomiting Anesthetic complications: no   No notable events documented.  Last Vitals:  Vitals:   03/05/21 1210 03/05/21 1225  BP: 116/75 121/74  Pulse: (!) 55 (!) 56  Resp: 13 15  Temp:  (!) 36.2 C  SpO2: 93% 95%    Last Pain:  Vitals:   03/05/21 1210  TempSrc:   PainSc: 0-No pain                 Barnet Glasgow

## 2021-03-05 NOTE — Progress Notes (Signed)
Orthopedic Tech Progress Note Patient Details:  [name] Gedney Sr. October 25, 1960 [id number]  Ortho Devices Type of Ortho Device: Arm sling Ortho Device/Splint Location: Left arm Ortho Device/Splint Interventions: Application   Post Interventions Patient Tolerated: Well  [name] 03/05/2021, 12:06 PM

## 2021-03-06 ENCOUNTER — Encounter (HOSPITAL_COMMUNITY): Payer: Self-pay | Admitting: Vascular Surgery

## 2021-03-10 DIAGNOSIS — M1990 Unspecified osteoarthritis, unspecified site: Secondary | ICD-10-CM | POA: Insufficient documentation

## 2021-03-10 DIAGNOSIS — R011 Cardiac murmur, unspecified: Secondary | ICD-10-CM | POA: Insufficient documentation

## 2021-03-10 DIAGNOSIS — R059 Cough, unspecified: Secondary | ICD-10-CM | POA: Insufficient documentation

## 2021-03-10 DIAGNOSIS — I509 Heart failure, unspecified: Secondary | ICD-10-CM | POA: Insufficient documentation

## 2021-03-11 ENCOUNTER — Other Ambulatory Visit: Payer: Self-pay | Admitting: Cardiology

## 2021-03-11 ENCOUNTER — Other Ambulatory Visit: Payer: Self-pay | Admitting: Family Medicine

## 2021-03-11 ENCOUNTER — Other Ambulatory Visit: Payer: Self-pay

## 2021-03-11 DIAGNOSIS — F32A Depression, unspecified: Secondary | ICD-10-CM

## 2021-03-11 DIAGNOSIS — R569 Unspecified convulsions: Secondary | ICD-10-CM

## 2021-03-11 DIAGNOSIS — F419 Anxiety disorder, unspecified: Secondary | ICD-10-CM

## 2021-03-11 MED ORDER — CITALOPRAM HYDROBROMIDE 40 MG PO TABS
40.0000 mg | ORAL_TABLET | Freq: Every day | ORAL | 2 refills | Status: DC
  Filled 2021-03-11: qty 30, 30d supply, fill #0
  Filled 2021-04-08: qty 30, 30d supply, fill #1
  Filled 2021-05-10: qty 30, 30d supply, fill #2

## 2021-03-11 MED ORDER — LEVETIRACETAM 500 MG PO TABS
500.0000 mg | ORAL_TABLET | Freq: Two times a day (BID) | ORAL | 2 refills | Status: DC
  Filled 2021-03-11: qty 60, 30d supply, fill #0
  Filled 2021-04-08: qty 60, 30d supply, fill #1
  Filled 2021-05-10: qty 60, 30d supply, fill #2

## 2021-03-11 MED ORDER — HYDROXYZINE HCL 25 MG PO TABS
ORAL_TABLET | Freq: Three times a day (TID) | ORAL | 3 refills | Status: DC | PRN
  Filled 2021-03-11: qty 90, 30d supply, fill #0
  Filled 2021-05-10: qty 90, 30d supply, fill #1
  Filled 2021-07-12: qty 90, 30d supply, fill #0
  Filled 2021-10-01: qty 90, 30d supply, fill #1

## 2021-03-11 MED ORDER — CLONIDINE HCL 0.2 MG PO TABS
0.2000 mg | ORAL_TABLET | Freq: Two times a day (BID) | ORAL | 0 refills | Status: DC
Start: 2021-03-11 — End: 2021-04-08
  Filled 2021-03-11: qty 60, 30d supply, fill #0

## 2021-03-11 MED ORDER — EZETIMIBE 10 MG PO TABS
10.0000 mg | ORAL_TABLET | Freq: Every day | ORAL | 0 refills | Status: DC
  Filled 2021-03-11: qty 30, 30d supply, fill #0

## 2021-03-11 MED ORDER — CARVEDILOL 3.125 MG PO TABS
ORAL_TABLET | ORAL | 6 refills | Status: DC
  Filled 2021-03-11: qty 60, 30d supply, fill #0
  Filled 2021-04-08: qty 60, 30d supply, fill #1
  Filled 2021-05-10: qty 60, 30d supply, fill #2

## 2021-03-11 MED FILL — Ergocalciferol Cap 1.25 MG (50000 Unit): ORAL | 28 days supply | Qty: 4 | Fill #3 | Status: AC

## 2021-03-11 NOTE — Telephone Encounter (Signed)
Requested medication (s) are due for refill today:   Yes except Atarax being requested early  Requested medication (s) are on the active medication list:   Yes for all 3  Future visit scheduled:   Yes   Last ordered: Atarax 04/27/2020- 04/27/2021 #90, 3 refills;    Celexa 02/09/2021 #30, 0 refills;    Keppra 02/09/2021 #60, 0 refills - Non Delegated Refill   Requested Prescriptions  Pending Prescriptions Disp Refills   hydrOXYzine (ATARAX/VISTARIL) 25 MG tablet 90 tablet 3    Sig: TAKE 1 TABLET (25 MG TOTAL) BY MOUTH 3 (THREE) TIMES DAILY AS NEEDED FOR ANXIETY.     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   [place], Charlane Ferretti, MD   1 year ago Type 2 diabetes mellitus with stage 4 chronic kidney disease, without long-term current use of insulin ([place])   [place], Turney, MD   1 year ago Type 2 diabetes mellitus with stage 3b chronic kidney disease, without long-term current use of insulin ([place])   [place], Enobong, MD   2 years ago Neuropathy   [place], Enobong, MD       Future Appointments             In 3 weeks [place], Hilton Cork, MD [facility] [place]   In 2 months Charlott Rakes, MD [place]             citalopram (CELEXA) 40 MG tablet 30 tablet 0    Sig: Take 1 tablet (40 mg total) by mouth daily.     Psychiatry:  Antidepressants - SSRI Passed - 03/11/2021 11:39 AM      Passed - Completed PHQ-2 or PHQ-9 in the  last 360 days      Passed - Valid encounter within last 6 months    Recent Outpatient Visits           3 weeks ago Type 2 diabetes mellitus with stage 5 chronic kidney disease not on chronic dialysis, without long-term current use of insulin ([place])   [place], Charlane Ferretti, MD   10 months ago Fibrillary glomerulonephritis   [place], Charlane Ferretti, MD   1 year ago Type 2 diabetes mellitus with stage 4 chronic kidney disease, without long-term current use of insulin ([place])   [place], Charlane Ferretti, MD   1 year ago Type 2 diabetes mellitus with stage 3b chronic kidney disease, without long-term current use of insulin ([place])   [place], Charlane Ferretti, MD   2 years ago Neuropathy   [place], Enobong, MD       Future Appointments             In 3 weeks [place], Hilton Cork, MD [facility]   In 2 months [place], Oden,  MD [facility]             levETIRAcetam (KEPPRA) 500 MG tablet 60 tablet 0    Sig: Take 1 tablet (500 mg total) by mouth 2 (two) times daily.     Not Delegated - Neurology:  Anticonvulsants Failed - 03/11/2021 11:39 AM      Failed - This refill cannot be delegated      Failed - HCT in normal range and within 360 days    HCT  Date Value Ref Range Status  03/05/2021 34.0 (L) 39.0 - 52.0 % Final          Failed - HGB in normal range and within 360 days    Hemoglobin  Date Value Ref Range Status  03/05/2021 11.6 (L) 13.0 - 17.0 g/dL Final          Failed - PLT in normal range and within 360 days    Platelets  Date Value Ref Range Status  03/11/2019 278 150 - 400 K/uL Final          Failed - WBC in normal range and within 360 days    WBC  Date Value Ref Range Status  03/11/2019 10.4 4.0 - 10.5 K/uL Final          Passed - Valid encounter  within last 12 months    Recent Outpatient Visits           3 weeks ago Type 2 diabetes mellitus with stage 5 chronic kidney disease not on chronic dialysis, without long-term current use of insulin ([place])   [place], Charlane Ferretti, MD   10 months 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

## 2021-03-22 ENCOUNTER — Other Ambulatory Visit (HOSPITAL_COMMUNITY): Payer: Self-pay | Admitting: Nephrology

## 2021-03-22 DIAGNOSIS — N185 Chronic kidney disease, stage 5: Secondary | ICD-10-CM

## 2021-03-22 LAB — HM DIABETES EYE EXAM

## 2021-03-24 ENCOUNTER — Other Ambulatory Visit: Payer: Self-pay | Admitting: Student

## 2021-03-24 ENCOUNTER — Other Ambulatory Visit: Payer: Self-pay

## 2021-03-24 ENCOUNTER — Other Ambulatory Visit: Payer: Self-pay | Admitting: Radiology

## 2021-03-25 ENCOUNTER — Other Ambulatory Visit: Payer: Self-pay

## 2021-03-25 ENCOUNTER — Ambulatory Visit (HOSPITAL_COMMUNITY)
Admission: RE | Admit: 2021-03-25 | Discharge: 2021-03-25 | Disposition: A | Discharge status: Home / Self Care | Payer: Medicare Other | Source: Ambulatory Visit | Attending: Nephrology | Admitting: Nephrology

## 2021-03-25 ENCOUNTER — Encounter (HOSPITAL_COMMUNITY): Payer: Self-pay

## 2021-03-25 DIAGNOSIS — Z79899 Other long term (current) drug therapy: Secondary | ICD-10-CM | POA: Insufficient documentation

## 2021-03-25 DIAGNOSIS — E1122 Type 2 diabetes mellitus with diabetic chronic kidney disease: Secondary | ICD-10-CM | POA: Diagnosis not present

## 2021-03-25 DIAGNOSIS — N186 End stage renal disease: Secondary | ICD-10-CM | POA: Diagnosis not present

## 2021-03-25 DIAGNOSIS — N185 Chronic kidney disease, stage 5: Secondary | ICD-10-CM

## 2021-03-25 DIAGNOSIS — I509 Heart failure, unspecified: Secondary | ICD-10-CM | POA: Diagnosis not present

## 2021-03-25 DIAGNOSIS — F17210 Nicotine dependence, cigarettes, uncomplicated: Secondary | ICD-10-CM | POA: Insufficient documentation

## 2021-03-25 DIAGNOSIS — J449 Chronic obstructive pulmonary disease, unspecified: Secondary | ICD-10-CM | POA: Insufficient documentation

## 2021-03-25 DIAGNOSIS — F419 Anxiety disorder, unspecified: Secondary | ICD-10-CM | POA: Insufficient documentation

## 2021-03-25 DIAGNOSIS — I132 Hypertensive heart and chronic kidney disease with heart failure and with stage 5 chronic kidney disease, or end stage renal disease: Secondary | ICD-10-CM | POA: Insufficient documentation

## 2021-03-25 DIAGNOSIS — Z833 Family history of diabetes mellitus: Secondary | ICD-10-CM | POA: Insufficient documentation

## 2021-03-25 DIAGNOSIS — Z8249 Family history of ischemic heart disease and other diseases of the circulatory system: Secondary | ICD-10-CM | POA: Diagnosis not present

## 2021-03-25 HISTORY — PX: IR FLUORO GUIDE CV LINE LEFT: IMG2282

## 2021-03-25 LAB — CBC WITH DIFFERENTIAL/PLATELET
Abs Immature Granulocytes: 0.06 10*3/uL (ref 0.00–0.07)
Basophils Absolute: 0.1 10*3/uL (ref 0.0–0.1)
Basophils Relative: 1 %
Eosinophils Absolute: 0.5 10*3/uL (ref 0.0–0.5)
Eosinophils Relative: 5 %
HCT: 37.1 % — ABNORMAL LOW (ref 39.0–52.0)
Hemoglobin: 12.2 g/dL — ABNORMAL LOW (ref 13.0–17.0)
Immature Granulocytes: 1 %
Lymphocytes Relative: 11 %
Lymphs Abs: 1.3 10*3/uL (ref 0.7–4.0)
MCH: 30.3 pg (ref 26.0–34.0)
MCHC: 32.9 g/dL (ref 30.0–36.0)
MCV: 92.1 fL (ref 80.0–100.0)
Monocytes Absolute: 1.1 10*3/uL — ABNORMAL HIGH (ref 0.1–1.0)
Monocytes Relative: 9 %
Neutro Abs: 8.7 10*3/uL — ABNORMAL HIGH (ref 1.7–7.7)
Neutrophils Relative %: 73 %
Platelets: 354 10*3/uL (ref 150–400)
RBC: 4.03 MIL/uL — ABNORMAL LOW (ref 4.22–5.81)
RDW: 12.7 % (ref 11.5–15.5)
WBC: 11.8 10*3/uL — ABNORMAL HIGH (ref 4.0–10.5)
nRBC: 0 % (ref 0.0–0.2)

## 2021-03-25 LAB — BASIC METABOLIC PANEL
Anion gap: 12 (ref 5–15)
BUN: 90 mg/dL — ABNORMAL HIGH (ref 8–23)
CO2: 18 mmol/L — ABNORMAL LOW (ref 22–32)
Calcium: 8.7 mg/dL — ABNORMAL LOW (ref 8.9–10.3)
Chloride: 109 mmol/L (ref 98–111)
Creatinine, Ser: 6.81 mg/dL — ABNORMAL HIGH (ref 0.61–1.24)
GFR, Estimated: 9 mL/min — ABNORMAL LOW (ref >60)
Glucose, Bld: 147 mg/dL — ABNORMAL HIGH (ref 70–99)
Potassium: 5.2 mmol/L — ABNORMAL HIGH (ref 3.5–5.1)
Sodium: 139 mmol/L (ref 135–145)

## 2021-03-25 LAB — GLUCOSE, CAPILLARY: Glucose-Capillary: 155 mg/dL — ABNORMAL HIGH (ref 70–99)

## 2021-03-25 LAB — PROTIME-INR
INR: 1 (ref 0.8–1.2)
Prothrombin Time: 13.5 seconds (ref 11.4–15.2)

## 2021-03-25 MED ORDER — MIDAZOLAM HCL 2 MG/2ML IJ SOLN
INTRAMUSCULAR | Status: DC | PRN
  Administered 2021-03-25: 1 mg via INTRAVENOUS

## 2021-03-25 MED ORDER — GELATIN ABSORBABLE 12-7 MM EX MISC
CUTANEOUS | Status: AC
  Filled 2021-03-25: qty 1

## 2021-03-25 MED ORDER — LIDOCAINE HCL 1 % IJ SOLN
INTRAMUSCULAR | Status: DC | PRN
  Administered 2021-03-25: 5 mL via INTRADERMAL
  Administered 2021-03-25: 20 mL via INTRADERMAL

## 2021-03-25 MED ORDER — CEFAZOLIN SODIUM-DEXTROSE 2-4 GM/100ML-% IV SOLN
INTRAVENOUS | Status: AC
  Administered 2021-03-25: 2 g via INTRAVENOUS
  Filled 2021-03-25: qty 100

## 2021-03-25 MED ORDER — HEPARIN SODIUM (PORCINE) 1000 UNIT/ML IJ SOLN
INTRAMUSCULAR | Status: AC
  Administered 2021-03-25: 3.2 mL
  Filled 2021-03-25: qty 1

## 2021-03-25 MED ORDER — FENTANYL CITRATE (PF) 100 MCG/2ML IJ SOLN
INTRAMUSCULAR | Status: AC
  Filled 2021-03-25: qty 2

## 2021-03-25 MED ORDER — FENTANYL CITRATE (PF) 100 MCG/2ML IJ SOLN
INTRAMUSCULAR | Status: DC | PRN
  Administered 2021-03-25: 25 ug via INTRAVENOUS

## 2021-03-25 MED ORDER — SODIUM CHLORIDE 0.9 % IV SOLN: INTRAVENOUS | Status: DC

## 2021-03-25 MED ORDER — MIDAZOLAM HCL 2 MG/2ML IJ SOLN
INTRAMUSCULAR | Status: AC
  Filled 2021-03-25: qty 2

## 2021-03-25 MED ORDER — LIDOCAINE HCL 1 % IJ SOLN
INTRAMUSCULAR | Status: AC
  Filled 2021-03-25: qty 20

## 2021-03-25 MED ORDER — CEFAZOLIN SODIUM-DEXTROSE 2-4 GM/100ML-% IV SOLN: 2.0000 g | INTRAVENOUS | Status: AC

## 2021-03-25 NOTE — H&P (Addendum)
Chief Complaint: Patient was seen in consultation today for tunneled hemodialysis catheter placement at the request of [place]  Referring Physician(s): Patel,Jay  Supervising Physician: Markus Daft  Patient Status: [facility] - Out-pt  History of Present Illness: [name] [name]. is a 60 y.o. male with PMH of anxiety, arthritis, CAD, CHF, COPD, dyspnea, CKD stage V, heart murmur, HTN, tobacco abuse and DM type II.  Patient had AV fistula creation to the left upper extremity on 03/05/2021.  Patient reports the AV fistula has not healed and does not work.  Patient referred by Dr. Posey Pronto to place tunneled hemodialysis catheter for patient to start HD treatments on 03/30/2021.  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   On Rituximab for Fibrillary GN   Myelopathy ([place]) 09/14/2017   Tobacco abuse 09/29/2017   Type 2 diabetes mellitus ([place]) 10/17/2011    Past Surgical History:  Procedure Laterality Date   ANTERIOR CERVICAL DECOMP/DISCECTOMY FUSION N/A 09/14/2017   Procedure: ANTERIOR CERVICAL DECOMPRESSION/DISCECTOMY FUSIONCERVICAL 4- CERVICAL 5, CERVICAL 5- CERVICAL 6;  Surgeon: Kary Kos, MD;  Location: [facility];  Service: Neurosurgery;  Laterality: N/A;  ANTERIOR CERVICAL DECOMPRESSION/DISCECTOMY FUSIONCERVICAL 4- CERVICAL 5, CERVICAL 5- CERVICAL 6   AV FISTULA PLACEMENT Left 03/05/2021   Procedure: LEFT UPPER EXTREMITY RADIOBASILIC  ARTERIOVENOUS (AV) FISTULA CREATION;  Surgeon: [name], MD;  [facility];  Service: Vascular;  Laterality: Left;  PERIPHERAL NERVE BLOCK     Allergies: Patient has no known allergies.  Medications: Prior to Admission medications   Medication Sig Start Date End Date Taking? Authorizing Provider  albuterol (VENTOLIN HFA) 108 (90 Base) MCG/ACT inhaler INHALE 2 PUFFS INTO THE LUNGS EVERY 6 (SIX) HOURS AS NEEDED FOR WHEEZING OR SHORTNESS OF BREATH. 02/09/21 02/09/22 Yes [place], Enobong, MD  amLODipine (NORVASC) 10 MG tablet Take 1 tablet by mouth at bedtime Patient taking differently: Take 10 mg by mouth at bedtime. 12/08/20  Yes   buPROPion (WELLBUTRIN XL) 150 MG 24 hr tablet Take 1 tablet (150 mg total) by mouth daily. 02/15/21  Yes Charlott Rakes, MD  carvedilol (COREG) 3.125 MG tablet 1 (one) Tablet by mouth two times daily 03/11/21  Yes   cetirizine (ZYRTEC) 10 MG tablet Take 10 mg by mouth daily as needed for allergies.   Yes [provider]  citalopram (CELEXA) 40 MG tablet Take 1 tablet (40 mg total) by mouth daily. 03/11/21 06/09/21 Yes Charlott Rakes, MD  cloNIDine (CATAPRES) 0.2 MG tablet Take 1 tablet (0.2 mg total) by mouth 2 (two) times daily. Please keep 04-06-21 appointment for further refills. 2 nd attempt 03/11/21  Yes Richardo Priest, MD  ezetimibe (ZETIA) 10 MG tablet Take 1 tablet (10 mg total) by mouth daily. Please keep 04-06-21 appointment for further refills. 2 nd attempt 03/11/21  Yes Richardo Priest, MD  fluticasone-salmeterol (ADVAIR) 250-50 MCG/ACT AEPB Inhale 1 puff into the lungs 2 (two) times daily. 04/27/20 04/27/21 Yes Charlott Rakes, MD  furosemide (LASIX) 80 MG tablet Take 1 tablet by mouth twice a day  01/19/21  Yes   HYDROcodone-acetaminophen (NORCO) 5-325 MG tablet Take 1 tablet by mouth every 6 (six) hours as needed for moderate pain. 03/05/21  Yes [name], MD  hydrOXYzine (ATARAX/VISTARIL) 25 MG tablet TAKE 1 TABLET (25 MG TOTAL) BY MOUTH 3 (THREE) TIMES DAILY AS NEEDED FOR ANXIETY. 03/11/21 03/11/22 Yes Charlott Rakes, MD  levETIRAcetam (KEPPRA) 500 MG tablet Take 1 tablet (500 mg total)  by mouth 2 (two) times daily. 03/11/21 06/09/21 Yes [place], Charlane Ferretti, MD  lisinopril (ZESTRIL) 40 MG tablet TAKE 1 TABLET BY MOUTH DAILY Patient taking differently: Take 40 mg by mouth daily. 05/19/20 05/26/21 Yes Elmarie Shiley, MD  pregabalin (LYRICA) 25 MG capsule Take 1 capsule (25 mg total) by mouth at bedtime. 12/01/20  Yes Elmarie Shiley, MD  rosuvastatin (CRESTOR) 10 MG tablet TAKE 1 TABLET (10 MG TOTAL) BY MOUTH DAILY. Patient taking differently: Take 10 mg by mouth daily. 02/15/21 02/15/22 Yes Charlott Rakes, MD  sodium zirconium cyclosilicate (LOKELMA) 10 g PACK packet TAKE 1 PACKET BY MOUTH DAILY Patient taking differently: Take 10 g by mouth daily. 08/24/20 08/24/21 Yes Elmarie Shiley, MD  Vitamin D, Ergocalciferol, (DRISDOL) 1.25 MG (50000 UNIT) CAPS capsule 1 (ONE) CAPSULE BY MOUTH WEEKLY Patient taking differently: Take 50,000 Units by mouth every 7 (seven) days. Tuesday's 08/28/20 08/28/21 Yes Elmarie Shiley, MD  lidocaine (LIDODERM) 5 % Place 1 patch onto the skin daily. Remove & Discard patch within 12 hours or as directed by MD 02/15/21   Charlott Rakes, MD  Polyethyl Glycol-Propyl Glycol (SYSTANE OP) Place 1 drop into both eyes daily.    [provider]     Family History  Problem Relation Age of Onset   Diabetes Mother    Scoliosis Mother    Kidney cancer Father    Lung cancer Father    Hypertension Brother    Colon cancer Neg Hx    Pancreatic cancer Neg Hx    Prostate cancer Neg Hx    Rectal cancer Neg Hx    Stomach cancer Neg Hx     Social History   Socioeconomic History   Marital status: Single    Spouse name: Not on file   Number of children: Not on file   Years of education: Not on file   Highest education level: Not on file  Occupational History   Not on file  Tobacco Use   Smoking status: Every Day    Packs/day: 1.00    Years: 42.00    Pack years: 42.00    Types: Cigarettes   Smokeless tobacco: Never   Tobacco comments:    1 pack a day   Vaping Use   Vaping  Use: Never used  Substance and Sexual Activity   Alcohol use: Yes    Alcohol/week: 10.0 standard drinks    Types: 10 Cans of beer per week    Comment: 28/week   Drug use: Yes    Types: Marijuana   Sexual activity: Not on file  Other Topics Concern   Not on file  Social History Narrative   Not on file   Social Determinants of Health   Financial Resource Strain: Not on file  Food Insecurity: Not on file  Transportation Needs: Not on file  Physical Activity: Not on file  Stress: Not on file  Social Connections: Not on file      Review of Systems: A 12 point ROS discussed and pertinent positives are indicated in the HPI above.  All other systems are  negative.  Review of Systems  Constitutional:  Negative for appetite change, chills and fever.  Respiratory:  Positive for shortness of breath.        Patient reports baseline SOB due to smoking history.  Cardiovascular:  Negative for chest pain and leg swelling.  Gastrointestinal:  Negative for abdominal pain, blood in stool, diarrhea, nausea and vomiting.   Vital Signs: There were no vitals taken for this visit.  Physical Exam Vitals reviewed.  Constitutional:      Appearance: Normal appearance. He is not ill-appearing.  HENT:     Head: Normocephalic and atraumatic.     Mouth/Throat:     Mouth: Mucous membranes are moist.     Pharynx: Oropharynx is clear.  Cardiovascular:     Rate and Rhythm: Bradycardia present.     Pulses: Normal pulses.     Heart sounds: Normal heart sounds. No murmur heard.   No gallop.  Pulmonary:     Effort: Pulmonary effort is normal. No respiratory distress.     Breath sounds: No stridor. No wheezing, rhonchi or rales.     Comments: Diminished in right lower lobe Abdominal:     General: There is no distension.     Palpations: Abdomen is soft.     Tenderness: There is no abdominal tenderness. There is no guarding.  Musculoskeletal:     Right lower leg: No edema.     Left lower leg: No  edema.  Skin:    General: Skin is warm and dry.     Comments: Healing incision to left upper extremity where patient had attempted AV fistula creation.  No bruit/thrill noted on auscultation.  Neurological:     Mental Status: He is alert and oriented to person, place, and time. Mental status is at baseline.  Psychiatric:        Mood and Affect: Mood normal.        Behavior: Behavior normal.        Thought Content: Thought content normal.        Judgment: Judgment normal.    Imaging: No results found.  Labs:  CBC: Recent Labs    03/05/21 0844  HGB 11.6*  HCT 34.0*    COAGS: No results for input(s): INR, APTT in the last 8760 hours.  BMP: Recent Labs    02/17/21 0924 03/05/21 0844  NA 133* 128*  K 5.4* 4.6  CL 96 99  CO2 15*  --   GLUCOSE 121* 160*  BUN 77* 109*  CALCIUM 8.7  --   CREATININE 5.28* 7.40*    LIVER FUNCTION TESTS: Recent Labs    02/17/21 0924  BILITOT 0.2  AST 17  ALT 17  ALKPHOS 63  PROT 5.3*  ALBUMIN 3.5*    TUMOR MARKERS: No results for input(s): AFPTM, CEA, CA199, CHROMGRNA in the last 8760 hours.  Assessment and Plan:  History of anxiety, arthritis, CAD, CHF, COPD, dyspnea, CKD stage V, heart murmur, HTN, tobacco abuse and DM type II.  Patient had AV fistula creation to the left upper extremity on 03/05/2021.  Patient reports the AV fistula has not healed and does not work.  Patient referred by Dr. Posey Pronto to place tunneled hemodialysis catheter for patient to start HD treatments on 03/30/2021.  Patient resting on stretcher.  He is alert and oriented, calm and pleasant. He is in no distress. No bruit noted over LUE AV fistula site. Patient reports he is NPO per order. He does not take blood thinning medication. No  labs needed today.  Risks and benefits discussed with the patient including, but not limited to bleeding, infection, vascular injury, pneumothorax which may require chest tube placement, air embolism or even death  All of  the patient's questions were answered, patient is agreeable to proceed. Consent signed and in chart.   Thank you for this interesting consult.  I greatly enjoyed meeting [employer] [name]. and look forward to participating in their care.  A copy of this report was sent to the requesting provider on this date.  Electronically Signed: Tyson Alias, NP 03/25/2021, 8:51 AM   I spent a total of 30 minutes in face to face in clinical consultation, greater than 50% of which was counseling/coordinating care for tunneled hemodialysis catheter placement.

## 2021-03-25 NOTE — Procedures (Signed)
Interventional Radiology Procedure:   Indications: ESRD and starting dialysis  Procedure: Tunneled dialysis catheter placement  Findings: Right jugular Palindrome (19 cm) with tip at SVC/RA junction  Complications: No immediate complications noted.     EBL: Minimal  Plan: Discharge to home and catheter is ready for use.    Randee Huston R. Anselm Pancoast, MD  Pager: [phone]

## 2021-03-26 DIAGNOSIS — D631 Anemia in chronic kidney disease: Secondary | ICD-10-CM | POA: Insufficient documentation

## 2021-03-26 DIAGNOSIS — R197 Diarrhea, unspecified: Secondary | ICD-10-CM | POA: Insufficient documentation

## 2021-03-26 DIAGNOSIS — R0602 Shortness of breath: Secondary | ICD-10-CM | POA: Insufficient documentation

## 2021-03-26 DIAGNOSIS — T7840XA Allergy, unspecified, initial encounter: Secondary | ICD-10-CM

## 2021-03-26 DIAGNOSIS — R509 Fever, unspecified: Secondary | ICD-10-CM | POA: Insufficient documentation

## 2021-03-26 DIAGNOSIS — N2581 Secondary hyperparathyroidism of renal origin: Secondary | ICD-10-CM | POA: Insufficient documentation

## 2021-03-26 DIAGNOSIS — L299 Pruritus, unspecified: Secondary | ICD-10-CM

## 2021-03-26 DIAGNOSIS — N189 Chronic kidney disease, unspecified: Secondary | ICD-10-CM

## 2021-03-26 DIAGNOSIS — T829XXA Unspecified complication of cardiac and vascular prosthetic device, implant and graft, initial encounter: Secondary | ICD-10-CM

## 2021-03-26 DIAGNOSIS — E1129 Type 2 diabetes mellitus with other diabetic kidney complication: Secondary | ICD-10-CM

## 2021-03-26 DIAGNOSIS — T782XXA Anaphylactic shock, unspecified, initial encounter: Secondary | ICD-10-CM | POA: Insufficient documentation

## 2021-03-26 DIAGNOSIS — D688 Other specified coagulation defects: Secondary | ICD-10-CM | POA: Insufficient documentation

## 2021-03-26 DIAGNOSIS — D509 Iron deficiency anemia, unspecified: Secondary | ICD-10-CM

## 2021-03-26 DIAGNOSIS — I2510 Atherosclerotic heart disease of native coronary artery without angina pectoris: Secondary | ICD-10-CM

## 2021-03-26 DIAGNOSIS — E1151 Type 2 diabetes mellitus with diabetic peripheral angiopathy without gangrene: Secondary | ICD-10-CM

## 2021-03-26 HISTORY — DX: Diarrhea, unspecified: R19.7

## 2021-03-26 HISTORY — DX: Unspecified complication of cardiac and vascular prosthetic device, implant and graft, initial encounter: T82.9XXA

## 2021-03-26 HISTORY — DX: Anemia in chronic kidney disease: N18.9

## 2021-03-26 HISTORY — DX: Pruritus, unspecified: L29.9

## 2021-03-26 HISTORY — DX: Fever, unspecified: R50.9

## 2021-03-26 HISTORY — DX: Anaphylactic shock, unspecified, initial encounter: T78.2XXA

## 2021-03-26 HISTORY — DX: Type 2 diabetes mellitus with diabetic peripheral angiopathy without gangrene: E11.51

## 2021-03-26 HISTORY — DX: Chronic kidney disease, unspecified: D63.1

## 2021-03-26 HISTORY — DX: Secondary hyperparathyroidism of renal origin: N25.81

## 2021-03-26 HISTORY — DX: Iron deficiency anemia, unspecified: D50.9

## 2021-03-26 HISTORY — DX: Allergy, unspecified, initial encounter: T78.40XA

## 2021-03-26 HISTORY — DX: Atherosclerotic heart disease of native coronary artery without angina pectoris: I25.10

## 2021-03-26 HISTORY — DX: Shortness of breath: R06.02

## 2021-03-26 HISTORY — DX: Other specified coagulation defects: D68.8

## 2021-03-26 HISTORY — DX: Type 2 diabetes mellitus with other diabetic kidney complication: E11.29

## 2021-03-30 ENCOUNTER — Other Ambulatory Visit: Payer: Self-pay | Admitting: *Deleted

## 2021-03-30 DIAGNOSIS — N185 Chronic kidney disease, stage 5: Secondary | ICD-10-CM

## 2021-04-05 NOTE — Progress Notes (Deleted)
Cardiology Office Note:    Date:  04/05/2021   ID:  [name] Slade Sr., DOB Sep 23, 1960, MRN [id number]  PCP:  Charlott Rakes, MD  Cardiologist:  Shirlee More, MD    Referring MD: Charlott Rakes, MD    ASSESSMENT:    No diagnosis found. PLAN:    In order of problems listed above:  ***   Next appointment: ***   Medication Adjustments/Labs and Tests Ordered: Current medicines are reviewed at length with the patient today.  Concerns regarding medicines are outlined above.  No orders of the defined types were placed in this encounter.  No orders of the defined types were placed in this encounter.   No chief complaint on file.   History of Present Illness:    [name] Angelino Sr. is a 60 y.o. male with a hx of type 2 diabetes  hypertension stage V CKD seizure disorder hyperkalemia coronary artery calcification last seen ***.  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   Myelopathy ([place]) 09/14/2017   Tobacco abuse 09/29/2017   Type 2 diabetes mellitus ([place]) 10/17/2011    Past Surgical History:  Procedure Laterality Date   ANTERIOR CERVICAL DECOMP/DISCECTOMY FUSION N/A 09/14/2017   Procedure: ANTERIOR CERVICAL DECOMPRESSION/DISCECTOMY FUSIONCERVICAL 4- CERVICAL 5, CERVICAL 5- CERVICAL 6;  Surgeon: Kary Kos, MD;  Location: [facility];  Service: Neurosurgery;  Laterality: N/A;  ANTERIOR CERVICAL  DECOMPRESSION/DISCECTOMY FUSIONCERVICAL 4- CERVICAL 5, CERVICAL 5- CERVICAL 6   AV FISTULA PLACEMENT Left 03/05/2021   Procedure: LEFT UPPER EXTREMITY RADIOBASILIC  ARTERIOVENOUS (AV) FISTULA CREATION;  Surgeon: [name], MD;  Location[facility];  Service: Vascular;  Laterality: Left;  PERIPHERAL NERVE BLOCK   IR FLUORO GUIDE CV LINE LEFT  03/25/2021    Current Medications: No outpatient medications have been marked as taking for the 04/06/21 encounter (Appointment) with Richardo Priest, MD.     Allergies:   Patient has no known allergies.   Social History   Socioeconomic History   Marital status: Single    Spouse name: Not on file   Number of children: Not on file   Years of education: Not on file   Highest education level: Not on file  Occupational History   Not on file  Tobacco Use   Smoking status: Every Day    Packs/day: 1.00    Years: 42.00    Pack years: 42.00    Types: Cigarettes   Smokeless tobacco: Never   Tobacco comments:    1 pack a day   Vaping Use   Vaping Use: Never used  Substance and Sexual Activity   Alcohol use: Yes    Alcohol/week: 10.0 standard drinks    Types: 10 Cans of beer per week    Comment: 28/week   Drug use: Yes    Types: Marijuana   Sexual activity: Not on file  Other Topics  Concern   Not on file  Social History Narrative   Not on file   Social Determinants of Health   Financial Resource Strain: Not on file  Food Insecurity: Not on file  Transportation Needs: Not on file  Physical Activity: Not on file  Stress: Not on file  Social Connections: Not on file     Family History: The patient's ***family history includes Diabetes in his mother; Hypertension in his brother; Kidney cancer in his father; Lung cancer in his father; Scoliosis in his mother. There is no history of Colon cancer, Pancreatic cancer, Prostate cancer, Rectal cancer, or Stomach cancer. ROS:   Please see the history of present illness.    All other systems  reviewed and are negative.  EKGs/Labs/Other Studies Reviewed:    The following studies were reviewed today:  EKG:  EKG ordered today and personally reviewed.  The ekg ordered today demonstrates ***  Recent Labs: 02/17/2021: ALT 17 03/25/2021: BUN 90; Creatinine, Ser 6.81; Hemoglobin 12.2; Platelets 354; Potassium 5.2; Sodium 139  Recent Lipid Panel    Component Value Date/Time   CHOL 169 02/17/2021 0924   TRIG 115 02/17/2021 0924   HDL 47 02/17/2021 0924   CHOLHDL 3.6 02/17/2021 0924   LDLCALC 101 (H) 02/17/2021 0924    Physical Exam:    VS:  There were no vitals taken for this visit.    Wt Readings from Last 3 Encounters:  03/25/21 220 lb 4.8 oz (99.9 kg)  03/05/21 238 lb (108 kg)  02/16/21 223 lb (101.2 kg)     GEN: *** Well nourished, well developed in no acute distress HEENT: Normal NECK: No JVD; No carotid bruits LYMPHATICS: No lymphadenopathy CARDIAC: ***RRR, no murmurs, rubs, gallops RESPIRATORY:  Clear to auscultation without rales, wheezing or rhonchi  ABDOMEN: Soft, non-tender, non-distended MUSCULOSKELETAL:  No edema; No deformity  SKIN: Warm and dry NEUROLOGIC:  Alert and oriented x 3 PSYCHIATRIC:  Normal affect    Signed, Shirlee More, MD  04/05/2021 3:56 PM    [facility]

## 2021-04-06 ENCOUNTER — Ambulatory Visit: Payer: Medicare Other | Admitting: Cardiology

## 2021-04-07 ENCOUNTER — Other Ambulatory Visit: Payer: Self-pay

## 2021-04-07 ENCOUNTER — Telehealth: Payer: Self-pay

## 2021-04-07 NOTE — Telephone Encounter (Signed)
Patient is requesting refill on his Zetia 10 mg, Crestor 10 mg and Catapres 0.2mg  until he can be seen at his next appointment in November if possible. CB # [id number]

## 2021-04-08 ENCOUNTER — Other Ambulatory Visit: Payer: Self-pay | Admitting: Cardiology

## 2021-04-08 ENCOUNTER — Other Ambulatory Visit: Payer: Self-pay

## 2021-04-08 MED ORDER — EZETIMIBE 10 MG PO TABS
10.0000 mg | ORAL_TABLET | Freq: Every day | ORAL | 0 refills | Status: DC
  Filled 2021-04-08: qty 30, 30d supply, fill #0

## 2021-04-08 MED ORDER — CLONIDINE HCL 0.2 MG PO TABS
0.2000 mg | ORAL_TABLET | Freq: Two times a day (BID) | ORAL | 0 refills | Status: DC
  Filled 2021-04-08: qty 60, 30d supply, fill #0

## 2021-04-09 ENCOUNTER — Other Ambulatory Visit: Payer: Self-pay

## 2021-04-13 ENCOUNTER — Ambulatory Visit (INDEPENDENT_AMBULATORY_CARE_PROVIDER_SITE_OTHER): Payer: Medicare Other | Admitting: Physician Assistant

## 2021-04-13 ENCOUNTER — Other Ambulatory Visit: Payer: Self-pay

## 2021-04-13 ENCOUNTER — Ambulatory Visit (HOSPITAL_COMMUNITY)
Admission: RE | Admit: 2021-04-13 | Discharge: 2021-04-13 | Disposition: A | Discharge status: Home / Self Care | Payer: Medicare Other | Source: Ambulatory Visit | Attending: Vascular Surgery | Admitting: Vascular Surgery

## 2021-04-13 VITALS — BP 105/66 | HR 52 | Temp 97.5°F | Resp 16 | Ht 72.0 in | Wt 219.3 lb

## 2021-04-13 DIAGNOSIS — N186 End stage renal disease: Secondary | ICD-10-CM

## 2021-04-13 DIAGNOSIS — N185 Chronic kidney disease, stage 5: Secondary | ICD-10-CM | POA: Diagnosis present

## 2021-04-13 DIAGNOSIS — Z992 Dependence on renal dialysis: Secondary | ICD-10-CM

## 2021-04-13 NOTE — Progress Notes (Signed)
    Postoperative Access Visit   History of Present Illness   [name] Mcgann Sr. is a 60 y.o. year old male who presents for postoperative follow-up for: Left radiobasilic AV fistula creation by Dr. Stanford Breed on 03/05/21.  The patient's wounds are healed. He reports that almost immediately after the surgery he could not feel a thrill in the fistula. He was also told at dialysis that it was "not working". He has some incisional numbness/ tenderness but otherwise notes no steal symptoms.  He is currently dialyzing on Tues/ Thurs/ Sat via a right IJ [facility]  Physical Examination   Vitals:   04/13/21 1149  BP: 105/66  Pulse: (!) 52  Resp: 16  Temp: (!) 97.5 F (36.4 C)  TempSrc: Temporal  SpO2: 95%  Weight: 219 lb 4.8 oz (99.5 kg)  Height: 6' (1.829 m)   Body mass index is 29.74 kg/m.  left arm Incision is well healed, 2+ radial pulse, hand grip is 5/5, sensation in digits is intact, no palpable thrill, bruit cannot be auscultated      Medical Decision Making   [name] Jafari Sr. is a 60 y.o. year old male who presents s/p Left radiobasilic AV fistula creation by Dr. Stanford Breed on 03/05/21. The fistula is occluded on duplex today.There is no palpable thrill or audible bruit. His incision has healed well. Patent is without signs or symptoms of steal syndrome. Based on his pre operative vein mapping his left basilic was best option for vein conduit. I believe that he now is indicated for left AV graft. I will schedule him for creation of left AV graft with Dr. Stanford Breed in the near future. He is currently dialyzing on Tues/ Thurs/ Sat so we will try to arrange this around his dialysis schedule if possible.   Karoline Caldwell, PA-C [facility] Office: [phone]  Clinic MD: Roxanne Mins

## 2021-04-13 NOTE — H&P (View-Only) (Signed)
    Postoperative Access Visit   History of Present Illness   [name] Schaller Sr. is a 61 y.o. year old male who presents for postoperative follow-up for: Left radiobasilic AV fistula creation by Dr. Stanford Breed on 03/05/21.  The patient's wounds are healed. He reports that almost immediately after the surgery he could not feel a thrill in the fistula. He was also told at dialysis that it was "not working". He has some incisional numbness/ tenderness but otherwise notes no steal symptoms.  He is currently dialyzing on Tues/ Thurs/ Sat via a right IJ [facility]  Physical Examination   Vitals:   04/13/21 1149  BP: 105/66  Pulse: (!) 52  Resp: 16  Temp: (!) 97.5 F (36.4 C)  TempSrc: Temporal  SpO2: 95%  Weight: 219 lb 4.8 oz (99.5 kg)  Height: 6' (1.829 m)   Body mass index is 29.74 kg/m.  left arm Incision is well healed, 2+ radial pulse, hand grip is 5/5, sensation in digits is intact, no palpable thrill, bruit cannot be auscultated      Medical Decision Making   [name] Skelton Sr. is a 60 y.o. year old male who presents s/p Left radiobasilic AV fistula creation by Dr. Stanford Breed on 03/05/21. The fistula is occluded on duplex today.There is no palpable thrill or audible bruit. His incision has healed well. Patent is without signs or symptoms of steal syndrome. Based on his pre operative vein mapping his left basilic was best option for vein conduit. I believe that he now is indicated for left AV graft. I will schedule him for creation of left AV graft with Dr. Stanford Breed in the near future. He is currently dialyzing on Tues/ Thurs/ Sat so we will try to arrange this around his dialysis schedule if possible.   Karoline Caldwell, PA-C [facility] Office: [phone]  Clinic MD: Roxanne Mins

## 2021-04-14 ENCOUNTER — Other Ambulatory Visit: Payer: Self-pay

## 2021-04-20 ENCOUNTER — Other Ambulatory Visit: Payer: Self-pay

## 2021-04-20 ENCOUNTER — Encounter (HOSPITAL_COMMUNITY): Payer: Self-pay | Admitting: Vascular Surgery

## 2021-04-20 NOTE — Progress Notes (Signed)
pt voiced understanding of new arrival time of 0730

## 2021-04-20 NOTE — Progress Notes (Signed)
Spoke with pt for pre-op call. Pt at dialysis while were doing this call, he stated he was fine doing it now. Pt has extensive cardiac history. Pt's cardiologist is Dr. Bettina Gavia and he has an upcoming appt with him on  05/07/21. Pt recently diagnosed with Type 2 diabetes. States he has not been given any medications or instructed to check his blood sugar. Last A1C was 7.1 on 02/15/21.   Pt's surgery is scheduled as ambulatory so no Covid test is required prior to surgery.

## 2021-04-21 ENCOUNTER — Ambulatory Visit (HOSPITAL_COMMUNITY): Payer: Medicare Other | Admitting: Anesthesiology

## 2021-04-21 ENCOUNTER — Encounter (HOSPITAL_COMMUNITY)
Admission: RE | Disposition: A | Discharge status: Home / Self Care | Payer: Self-pay | Source: Home / Self Care | Attending: Vascular Surgery

## 2021-04-21 ENCOUNTER — Ambulatory Visit (HOSPITAL_COMMUNITY)
Admission: RE | Admit: 2021-04-21 | Discharge: 2021-04-21 | Disposition: A | Discharge status: Home / Self Care | Payer: Medicare Other | Attending: Vascular Surgery | Admitting: Vascular Surgery

## 2021-04-21 ENCOUNTER — Encounter (HOSPITAL_COMMUNITY): Payer: Self-pay | Admitting: Vascular Surgery

## 2021-04-21 DIAGNOSIS — Z833 Family history of diabetes mellitus: Secondary | ICD-10-CM | POA: Insufficient documentation

## 2021-04-21 DIAGNOSIS — T82868A Thrombosis of vascular prosthetic devices, implants and grafts, initial encounter: Secondary | ICD-10-CM

## 2021-04-21 DIAGNOSIS — F17210 Nicotine dependence, cigarettes, uncomplicated: Secondary | ICD-10-CM | POA: Diagnosis not present

## 2021-04-21 DIAGNOSIS — Z8249 Family history of ischemic heart disease and other diseases of the circulatory system: Secondary | ICD-10-CM | POA: Diagnosis not present

## 2021-04-21 DIAGNOSIS — I509 Heart failure, unspecified: Secondary | ICD-10-CM | POA: Diagnosis not present

## 2021-04-21 DIAGNOSIS — E1122 Type 2 diabetes mellitus with diabetic chronic kidney disease: Secondary | ICD-10-CM | POA: Insufficient documentation

## 2021-04-21 DIAGNOSIS — Z79899 Other long term (current) drug therapy: Secondary | ICD-10-CM | POA: Diagnosis not present

## 2021-04-21 DIAGNOSIS — T82898A Other specified complication of vascular prosthetic devices, implants and grafts, initial encounter: Secondary | ICD-10-CM | POA: Diagnosis not present

## 2021-04-21 DIAGNOSIS — Z8051 Family history of malignant neoplasm of kidney: Secondary | ICD-10-CM | POA: Insufficient documentation

## 2021-04-21 DIAGNOSIS — X58XXXA Exposure to other specified factors, initial encounter: Secondary | ICD-10-CM | POA: Insufficient documentation

## 2021-04-21 DIAGNOSIS — I132 Hypertensive heart and chronic kidney disease with heart failure and with stage 5 chronic kidney disease, or end stage renal disease: Secondary | ICD-10-CM | POA: Diagnosis not present

## 2021-04-21 DIAGNOSIS — Z992 Dependence on renal dialysis: Secondary | ICD-10-CM | POA: Diagnosis not present

## 2021-04-21 DIAGNOSIS — I2510 Atherosclerotic heart disease of native coronary artery without angina pectoris: Secondary | ICD-10-CM | POA: Insufficient documentation

## 2021-04-21 DIAGNOSIS — N186 End stage renal disease: Secondary | ICD-10-CM | POA: Insufficient documentation

## 2021-04-21 DIAGNOSIS — Z801 Family history of malignant neoplasm of trachea, bronchus and lung: Secondary | ICD-10-CM | POA: Diagnosis not present

## 2021-04-21 DIAGNOSIS — J449 Chronic obstructive pulmonary disease, unspecified: Secondary | ICD-10-CM | POA: Diagnosis not present

## 2021-04-21 DIAGNOSIS — Z7951 Long term (current) use of inhaled steroids: Secondary | ICD-10-CM | POA: Diagnosis not present

## 2021-04-21 HISTORY — DX: Anemia, unspecified: D64.9

## 2021-04-21 HISTORY — DX: Unspecified convulsions: R56.9

## 2021-04-21 HISTORY — PX: AV FISTULA PLACEMENT: SHX1204

## 2021-04-21 LAB — POCT I-STAT, CHEM 8
BUN: 23 mg/dL (ref 8–23)
Calcium, Ion: 1.09 mmol/L — ABNORMAL LOW (ref 1.15–1.40)
Chloride: 96 mmol/L — ABNORMAL LOW (ref 98–111)
Creatinine, Ser: 5.1 mg/dL — ABNORMAL HIGH (ref 0.61–1.24)
Glucose, Bld: 142 mg/dL — ABNORMAL HIGH (ref 70–99)
HCT: 36 % — ABNORMAL LOW (ref 39.0–52.0)
Hemoglobin: 12.2 g/dL — ABNORMAL LOW (ref 13.0–17.0)
Potassium: 3.7 mmol/L (ref 3.5–5.1)
Sodium: 136 mmol/L (ref 135–145)
TCO2: 30 mmol/L (ref 22–32)

## 2021-04-21 LAB — GLUCOSE, CAPILLARY
Glucose-Capillary: 184 mg/dL — ABNORMAL HIGH (ref 70–99)
Glucose-Capillary: 177 mg/dL — ABNORMAL HIGH (ref 70–99)

## 2021-04-21 SURGERY — INSERTION OF ARTERIOVENOUS (AV) GORE-TEX GRAFT ARM
Anesthesia: Monitor Anesthesia Care | Site: Arm Upper | Laterality: Left

## 2021-04-21 MED ORDER — PAPAVERINE HCL 30 MG/ML IJ SOLN
INTRAMUSCULAR | Status: AC
  Filled 2021-04-21: qty 2

## 2021-04-21 MED ORDER — PROPOFOL 500 MG/50ML IV EMUL
INTRAVENOUS | Status: DC | PRN
  Administered 2021-04-21: 100 ug/kg/min via INTRAVENOUS

## 2021-04-21 MED ORDER — CHLORHEXIDINE GLUCONATE 4 % EX LIQD: 60.0000 mL | Freq: Once | CUTANEOUS | Status: DC

## 2021-04-21 MED ORDER — FENTANYL CITRATE (PF) 100 MCG/2ML IJ SOLN
INTRAMUSCULAR | Status: AC
  Administered 2021-04-21: 100 ug via INTRAVENOUS
  Filled 2021-04-21: qty 2

## 2021-04-21 MED ORDER — ONDANSETRON HCL 4 MG/2ML IJ SOLN
INTRAMUSCULAR | Status: DC | PRN
  Administered 2021-04-21: 4 mg via INTRAVENOUS

## 2021-04-21 MED ORDER — CHLORHEXIDINE GLUCONATE 0.12 % MT SOLN
OROMUCOSAL | Status: AC
  Administered 2021-04-21: 15 mL via OROMUCOSAL
  Filled 2021-04-21: qty 15

## 2021-04-21 MED ORDER — MIDAZOLAM HCL 2 MG/2ML IJ SOLN
INTRAMUSCULAR | Status: AC
  Administered 2021-04-21: 2 mg via INTRAVENOUS
  Filled 2021-04-21: qty 2

## 2021-04-21 MED ORDER — LIDOCAINE-EPINEPHRINE (PF) 1 %-1:200000 IJ SOLN
INTRAMUSCULAR | Status: DC | PRN
  Administered 2021-04-21: 30 mL

## 2021-04-21 MED ORDER — PHENYLEPHRINE HCL-NACL 20-0.9 MG/250ML-% IV SOLN
INTRAVENOUS | Status: DC | PRN
  Administered 2021-04-21: 20 ug/min via INTRAVENOUS

## 2021-04-21 MED ORDER — OXYCODONE-ACETAMINOPHEN 10-325 MG PO TABS: 1.0000 | ORAL_TABLET | Freq: Four times a day (QID) | ORAL | 0 refills | Status: DC | PRN

## 2021-04-21 MED ORDER — AMISULPRIDE (ANTIEMETIC) 5 MG/2ML IV SOLN: 10.0000 mg | Freq: Once | INTRAVENOUS | Status: DC | PRN

## 2021-04-21 MED ORDER — 0.9 % SODIUM CHLORIDE (POUR BTL) OPTIME
TOPICAL | Status: DC | PRN
  Administered 2021-04-21: 1000 mL

## 2021-04-21 MED ORDER — FENTANYL CITRATE (PF) 100 MCG/2ML IJ SOLN
25.0000 ug | INTRAMUSCULAR | Status: DC | PRN
  Administered 2021-04-21: 200 ug via INTRAVENOUS
  Administered 2021-04-21: 50 ug via INTRAVENOUS

## 2021-04-21 MED ORDER — HEPARIN 6000 UNIT IRRIGATION SOLUTION
Status: AC
  Filled 2021-04-21: qty 500

## 2021-04-21 MED ORDER — LIDOCAINE-EPINEPHRINE (PF) 1.5 %-1:200000 IJ SOLN
INTRAMUSCULAR | Status: DC | PRN
  Administered 2021-04-21: 30 mL via PERINEURAL

## 2021-04-21 MED ORDER — CEFAZOLIN SODIUM-DEXTROSE 2-4 GM/100ML-% IV SOLN
2.0000 g | INTRAVENOUS | Status: AC
  Administered 2021-04-21: 2 g via INTRAVENOUS
  Filled 2021-04-21: qty 100

## 2021-04-21 MED ORDER — MIDAZOLAM HCL 2 MG/2ML IJ SOLN: 2.0000 mg | Freq: Once | INTRAMUSCULAR | Status: AC

## 2021-04-21 MED ORDER — CHLORHEXIDINE GLUCONATE 0.12 % MT SOLN: 15.0000 mL | Freq: Once | OROMUCOSAL | Status: AC

## 2021-04-21 MED ORDER — FENTANYL CITRATE (PF) 250 MCG/5ML IJ SOLN
INTRAMUSCULAR | Status: AC
  Filled 2021-04-21: qty 5

## 2021-04-21 MED ORDER — FENTANYL CITRATE (PF) 100 MCG/2ML IJ SOLN: 100.0000 ug | Freq: Once | INTRAMUSCULAR | Status: AC

## 2021-04-21 MED ORDER — ACETAMINOPHEN 500 MG PO TABS
1000.0000 mg | ORAL_TABLET | Freq: Once | ORAL | Status: AC
  Administered 2021-04-21: 1000 mg via ORAL
  Filled 2021-04-21: qty 2

## 2021-04-21 MED ORDER — LIDOCAINE-EPINEPHRINE (PF) 1 %-1:200000 IJ SOLN
INTRAMUSCULAR | Status: AC
  Filled 2021-04-21: qty 30

## 2021-04-21 MED ORDER — DEXAMETHASONE SODIUM PHOSPHATE 10 MG/ML IJ SOLN
INTRAMUSCULAR | Status: DC | PRN
  Administered 2021-04-21: 5 mg

## 2021-04-21 MED ORDER — PROMETHAZINE HCL 25 MG/ML IJ SOLN: 6.2500 mg | INTRAMUSCULAR | Status: DC | PRN

## 2021-04-21 MED ORDER — ORAL CARE MOUTH RINSE: 15.0000 mL | Freq: Once | OROMUCOSAL | Status: AC

## 2021-04-21 MED ORDER — SODIUM CHLORIDE 0.9 % IV SOLN: INTRAVENOUS | Status: DC

## 2021-04-21 MED ORDER — HEPARIN 6000 UNIT IRRIGATION SOLUTION
Status: DC | PRN
  Administered 2021-04-21: 1

## 2021-04-21 MED ORDER — PROPOFOL 500 MG/50ML IV EMUL: INTRAVENOUS | Status: DC | PRN

## 2021-04-21 MED ORDER — HEPARIN SODIUM (PORCINE) 1000 UNIT/ML IJ SOLN
INTRAMUSCULAR | Status: AC
  Filled 2021-04-21: qty 1

## 2021-04-21 MED ORDER — HEPARIN SODIUM (PORCINE) 1000 UNIT/ML IJ SOLN
INTRAMUSCULAR | Status: DC | PRN
  Administered 2021-04-21: 5000 [IU] via INTRAVENOUS

## 2021-04-21 SURGICAL SUPPLY — 44 items
ADH SKN CLS APL DERMABOND .7 (GAUZE/BANDAGES/DRESSINGS) ×1
APL PRP STRL LF DISP 70% ISPRP (MISCELLANEOUS) ×1
APL SKNCLS STERI-STRIP NONHPOA (GAUZE/BANDAGES/DRESSINGS) ×1
ARMBAND PINK RESTRICT EXTREMIT (MISCELLANEOUS) ×4 IMPLANT
BENZOIN TINCTURE PRP APPL 2/3 (GAUZE/BANDAGES/DRESSINGS) ×2 IMPLANT
CANISTER SUCT 3000ML PPV (MISCELLANEOUS) ×2 IMPLANT
CANNULA VESSEL 3MM 2 BLNT TIP (CANNULA) ×2 IMPLANT
CHLORAPREP W/TINT 26 (MISCELLANEOUS) ×2 IMPLANT
CLIP VESOCCLUDE MED 6/CT (CLIP) ×2 IMPLANT
CLIP VESOCCLUDE SM WIDE 6/CT (CLIP) ×2 IMPLANT
COVER PROBE W GEL 5X96 (DRAPES) ×1 IMPLANT
DERMABOND ADVANCED (GAUZE/BANDAGES/DRESSINGS) ×1
DERMABOND ADVANCED .7 DNX12 (GAUZE/BANDAGES/DRESSINGS) IMPLANT
ELECT REM PT RETURN 9FT ADLT (ELECTROSURGICAL) ×2
ELECTRODE REM PT RTRN 9FT ADLT (ELECTROSURGICAL) ×1 IMPLANT
GLOVE SURG ENC MOIS LTX SZ8 (GLOVE) ×2 IMPLANT
GLOVE SURG UNDER POLY LF SZ6.5 (GLOVE) ×2 IMPLANT
GOWN STRL REUS W/ TWL LRG LVL3 (GOWN DISPOSABLE) ×2 IMPLANT
GOWN STRL REUS W/ TWL XL LVL3 (GOWN DISPOSABLE) ×1 IMPLANT
GOWN STRL REUS W/TWL LRG LVL3 (GOWN DISPOSABLE) ×6
GOWN STRL REUS W/TWL XL LVL3 (GOWN DISPOSABLE) ×2
GRAFT GORETEX STRT 4-7X45 (Vascular Products) ×1 IMPLANT
HEMOSTAT SNOW SURGICEL 2X4 (HEMOSTASIS) IMPLANT
INSERT FOGARTY SM (MISCELLANEOUS) ×2 IMPLANT
KIT BASIN OR (CUSTOM PROCEDURE TRAY) ×2 IMPLANT
KIT TURNOVER KIT B (KITS) ×2 IMPLANT
NDL 18GX1X1/2 (RX/OR ONLY) (NEEDLE) IMPLANT
NEEDLE 18GX1X1/2 (RX/OR ONLY) (NEEDLE) IMPLANT
NS IRRIG 1000ML POUR BTL (IV SOLUTION) ×2 IMPLANT
PACK CV ACCESS (CUSTOM PROCEDURE TRAY) ×2 IMPLANT
PAD ARMBOARD 7.5X6 YLW CONV (MISCELLANEOUS) ×4 IMPLANT
SLING ARM FOAM STRAP LRG (SOFTGOODS) IMPLANT
STRIP CLOSURE SKIN 1/2X4 (GAUZE/BANDAGES/DRESSINGS) ×2 IMPLANT
SUT GORETEX 6.0 TT9 (SUTURE) ×2 IMPLANT
SUT MNCRL AB 4-0 PS2 18 (SUTURE) ×4 IMPLANT
SUT PROLENE 6 0 BV (SUTURE) ×2 IMPLANT
SUT SILK 2 0 SH (SUTURE) IMPLANT
SUT VIC AB 3-0 SH 27 (SUTURE) ×6
SUT VIC AB 3-0 SH 27X BRD (SUTURE) ×2 IMPLANT
SYR 3ML LL SCALE MARK (SYRINGE) IMPLANT
SYR TOOMEY 50ML (SYRINGE) IMPLANT
TOWEL GREEN STERILE (TOWEL DISPOSABLE) ×2 IMPLANT
UNDERPAD 30X36 HEAVY ABSORB (UNDERPADS AND DIAPERS) ×2 IMPLANT
WATER STERILE IRR 1000ML POUR (IV SOLUTION) ×2 IMPLANT

## 2021-04-21 NOTE — Anesthesia Procedure Notes (Signed)
Anesthesia Regional Block: Supraclavicular block   Pre-Anesthetic Checklist: , timeout performed,  Correct Patient, Correct Site, Correct Laterality,  Correct Procedure, Correct Position, site marked,  Risks and benefits discussed,  Surgical consent,  Pre-op evaluation,  At surgeon's request and post-op pain management  Laterality: Left  Prep: chloraprep       Needles:  Injection technique: Single-shot  Needle Type: Echogenic Stimulator Needle     Needle Length: 5cm  Needle Gauge: 22     Additional Needles:   Narrative:  Start time: 04/21/2021 9:17 AM End time: 04/21/2021 9:27 AM Injection made incrementally with aspirations every 5 mL.  Performed by: Personally  Anesthesiologist: Duane Boston, MD  Additional Notes: Functioning IV was confirmed and monitors applied.  A 13mm 22ga echogenic arrow stimulator was used. Sterile prep and drape,hand hygiene and sterile gloves were used.Ultrasound guidance: relevant anatomy identified, needle position confirmed, local anesthetic spread visualized around nerve(s)., vascular puncture avoided.  Image printed for medical record.  Negative aspiration and negative test dose prior to incremental administration of local anesthetic. The patient tolerated the procedure well.

## 2021-04-21 NOTE — Interval H&P Note (Signed)
History and Physical Interval Note:  04/21/2021 11:23 AM  [name] Slade Sr.  has presented today for surgery, with the diagnosis of End stage renal disease.  The various methods of treatment have been discussed with the patient and family. After consideration of risks, benefits and other options for treatment, the patient has consented to  Procedure(s) with comments: INSERTION OF  LEFT ARM ARTERIOVENOUS (AV) GORE-TEX GRAFT (Left) - PERIPHERAL NERVE BLOCK as a surgical intervention.  The patient's history has been reviewed, patient examined, no change in status, stable for surgery.  I have reviewed the patient's chart and labs.  Questions were answered to the patient's satisfaction.     [name]

## 2021-04-21 NOTE — Anesthesia Preprocedure Evaluation (Addendum)
Anesthesia Evaluation  Patient identified by MRN, date of birth, ID band Patient awake    Reviewed: Allergy & Precautions, NPO status , Patient's Chart, lab work & pertinent test results  History of Anesthesia Complications Negative for: history of anesthetic complications  Airway Mallampati: II  TM Distance: >3 FB Neck ROM: Full    Dental  (+) Dental Advisory Given, Edentulous Lower, Edentulous Upper   Pulmonary COPD,  COPD inhaler, Current Smoker and Patient abstained from smoking.,    Pulmonary exam normal        Cardiovascular hypertension, Pt. on home beta blockers and Pt. on medications + CAD and +CHF  Normal cardiovascular exam  2019 ? Nuclear stress EF: 50%. ? There was no ST segment deviation noted during stress. ? The study is normal. ? This is a low risk study. ? The left ventricular ejection fraction is mildly decreased (45-54%).   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 Systolic function was  normal. The estimated ejection fraction was in the range of 55%  to 60%. Wall motion was normal; there were no regional wall  motion abnormalities. The study is not technically sufficient to  allow evaluation of LV diastolic function.  - Aortic valve: Transvalvular velocity was within the normal range.  There was no stenosis. There was no regurgitation.  - Mitral valve: Transvalvular velocity was within the normal range.  There was no evidence for stenosis. There was trivial  regurgitation.  - Right ventricle: The cavity size was normal. Wall thickness was  normal. Systolic function was normal.  - Tricuspid valve: There was trivial regurgitation.  - Pulmonary arteries: Systolic pressure was within the normal  range. PA  peak pressure: 34 mm Hg (S).    Neuro/Psych Anxiety    GI/Hepatic   Endo/Other  diabetes  Renal/GU CRFRenal disease     Musculoskeletal  (+) Arthritis ,   Abdominal   Peds  Hematology   Anesthesia Other Findings   Reproductive/Obstetrics                            Anesthesia Physical  Anesthesia Plan  ASA: 3  Anesthesia Plan: Regional and MAC   Post-op Pain Management:    Induction:   PONV Risk Score and Plan: 1 and Treatment may vary due to age or medical condition, Ondansetron, Propofol infusion and Midazolam  Airway Management Planned: Natural Airway  Additional Equipment: None  Intra-op Plan:   Post-operative Plan:   Informed Consent: I have reviewed the patients History and Physical, chart, labs and discussed the procedure including the risks, benefits and alternatives for the proposed anesthesia with the patient or authorized representative who has indicated his/her understanding and acceptance.     Dental advisory given  Plan Discussed with: CRNA  Anesthesia Plan Comments:        Anesthesia Quick Evaluation

## 2021-04-21 NOTE — Transfer of Care (Signed)
Immediate Anesthesia Transfer of Care Note  Patient: [name] Slade Sr.  Procedure(s) Performed: INSERTION OF  LEFT ARM ARTERIOVENOUS (AV) GORE-TEX GRAFT (Left: Arm Upper)  Patient Location: PACU  Anesthesia Type:MAC and Regional  Level of Consciousness: awake, alert  and oriented  Airway & Oxygen Therapy: Patient Spontanous Breathing and Patient connected to nasal cannula oxygen  Post-op Assessment: Report given to RN and Post -op Vital signs reviewed and stable  Post vital signs: Reviewed and stable  Last Vitals:  Vitals Value Taken Time  BP 107/62 04/21/21 1321  Temp    Pulse 57 04/21/21 1325  Resp 7 04/21/21 1325  SpO2 92 % 04/21/21 1325  Vitals shown include unvalidated device data.  Last Pain:  Vitals:   04/21/21 0753  TempSrc: Oral  PainSc: 0-No pain         Complications: No notable events documented.

## 2021-04-21 NOTE — Discharge Instructions (Addendum)
° °  [facility] [facility] ° °Discharge Instructions ° °AV Fistula or Graft Surgery for Dialysis Access ° °Please refer to the following instructions for your post-procedure care. Your surgeon or physician assistant will discuss any changes with you. ° °Activity ° °You may drive the day following your surgery, if you are comfortable and no longer taking prescription pain medication. Resume full activity as the soreness in your incision resolves. ° °Bathing/Showering ° °You may shower after you go home. Keep your incision dry for 48 hours. Do not soak in a bathtub, hot tub, or swim until the incision heals completely. You may not shower if you have a hemodialysis catheter. ° °Incision Care ° °Clean your incision with mild soap and water after 48 hours. Pat the area dry with a clean towel. You do not need a bandage unless otherwise instructed. Do not apply any ointments or creams to your incision. You may have skin glue on your incision. Do not peel it off. It will come off on its own in about one week. Your arm may swell a bit after surgery. To reduce swelling use pillows to elevate your arm so it is above your heart. Your doctor will tell you if you need to lightly wrap your arm with an ACE bandage. ° °Diet ° °Resume your normal diet. There are not special food restrictions following this procedure. In order to heal from your surgery, it is CRITICAL to get adequate nutrition. Your body requires vitamins, minerals, and protein. Vegetables are the best source of vitamins and minerals. Vegetables also provide the perfect balance of protein. Processed food has little nutritional value, so try to avoid this. ° °Medications ° °Resume taking all of your medications. If your incision is causing pain, you may take over-the counter pain relievers such as acetaminophen (Tylenol). If you were prescribed a stronger pain medication, please be aware these medications can cause nausea and constipation. Prevent  nausea by taking the medication with a snack or meal. Avoid constipation by drinking plenty of fluids and eating foods with high amount of fiber, such as fruits, vegetables, and grains. Do not take Tylenol if you are taking prescription pain medications. ° ° ° ° °Follow up °Your surgeon may want to see you in the office following your access surgery. If so, this will be arranged at the time of your surgery. ° °Please call us immediately for any of the following conditions: ° °Increased pain, redness, drainage (pus) from your incision site °Fever of 101 degrees or higher °Severe or worsening pain at your incision site °Hand pain or numbness. ° °Reduce your risk of vascular disease: ° °Stop smoking. If you would like help, call [facility] at 1-800-QUIT-NOW (1-800-784-8669) or [place] at [phone] ° °Manage your cholesterol °Maintain a desired weight °Control your diabetes °Keep your blood pressure down ° °Dialysis ° °It will take several weeks to several months for your new dialysis access to be ready for use. Your surgeon will determine when it is OK to use it. Your nephrologist will continue to direct your dialysis. You can continue to use your Permcath until your new access is ready for use. ° °If you have any questions, please call the office at [phone]. ° °

## 2021-04-21 NOTE — Anesthesia Postprocedure Evaluation (Signed)
Anesthesia Post Note  Patient: Juwon Scripter Sr.  Procedure(s) Performed: INSERTION OF  LEFT ARM ARTERIOVENOUS (AV) GORE-TEX GRAFT (Left: Arm Upper)     Patient location during evaluation: PACU Anesthesia Type: Regional and MAC Level of consciousness: awake and alert Pain management: pain level controlled Vital Signs Assessment: post-procedure vital signs reviewed and stable Respiratory status: spontaneous breathing and respiratory function stable Cardiovascular status: stable Postop Assessment: no apparent nausea or vomiting Anesthetic complications: no   No notable events documented.  Last Vitals:  Vitals:   04/21/21 1336 04/21/21 1351  BP: 91/68 94/68  Pulse: (!) 54 60  Resp: 12 12  Temp:  36.5 C  SpO2: 94% 94%    Last Pain:  Vitals:   04/21/21 1351  TempSrc:   PainSc: 0-No pain                 Samarra Ridgely DANIEL

## 2021-04-21 NOTE — Op Note (Signed)
DATE OF SERVICE: 04/21/2021  PATIENT:  [name] Slade Sr.  60 y.o. male  PRE-OPERATIVE DIAGNOSIS:  ESRD  POST-OPERATIVE DIAGNOSIS:  Same  PROCEDURE:   Left upper extremity arteriovenous graft placement  SURGEON:  Surgeon(s) and Role:    * [name], MD - Primary  ASSISTANT: Risa Grill, PA-C  An assistant was required to facilitate exposure and expedite the case.  ANESTHESIA:   regional and MAC  EBL: minimal  BLOOD ADMINISTERED:none  DRAINS: none   LOCAL MEDICATIONS USED:  LIDOCAINE   SPECIMEN:  none  COUNTS: confirmed correct.  TOURNIQUET:  none  PATIENT DISPOSITION:  PACU - hemodynamically stable.   Delay start of Pharmacological VTE agent (>24hrs) due to surgical blood loss or risk of bleeding: no  INDICATION FOR PROCEDURE: [name] Arts Sr. is a 60 y.o. male with ESRD dialyzing TTS via RIJ TDC. I created a left radio-basilic arteriovenous fistula for him several weeks ago, but this thrombosed.. After careful discussion of risks, benefits, and alternatives the patient was offered left upper extremity grafting. The patient understood and wished to proceed.  OPERATIVE FINDINGS: unremarkable AVG. Good doppler flow in outflow and radial artery at completion.  DESCRIPTION OF PROCEDURE: After identification of the patient in the pre-operative holding area, the patient was transferred to the operating room. The patient was positioned supine on the operating room table. Anesthesia was induced. The left arm was prepped and draped in standard fashion. A surgical pause was performed confirming correct patient, procedure, and operative location.  The left radial artery was reexposed using the previous incision. The previous fistula anastomosis was skeletonized.  Silastic Vesseloops were placed around the artery proximally distally.     The left brachial vein near the axilla was exposed using longitudinal incision just below the hairbearing area of the axilla.  Incision  was carried down until the brachial sheath was encountered.  The brachial vein was identified, exposed, encircled with Silastic Vesseloops.   Using a curved, sheathed tunneling device, a 4-7 mm tapered Gore-Tex graft was tunneled subcutaneously and gentle arc across the biceps of the left arm.  Patient was then heparinized with 5000 units of IV heparin.   The artery was clamped proximally and distally.  The previous anastomosis was excised and used for our new anastomosis. The 4 mm end of the Gore-Tex graft was spatulated and then anastomosed end-to-side to the brachial arteriotomy using continuous running suture of 6-0 Prolene.  The anastomosis was completed and hemostasis ensured.  The graft was clamped to restore perfusion to the hand.   The brachial vein was clamped proximally and distally.  An anterior venotomy was made with an 11 blade.  This was extended with Potts scissors.  The 7 mm end of the Gore-Tex graft was then anastomosed end to side to the brachial vein venotomy using continuous running suture of 6-0 Prolene.  Immediately prior to completion the anastomosis was de-aired and flushed.  Anastomosis was then completed.  Hemostasis was insured.   Doppler machine was brought onto the field to interrogate the graft.  Doppler flow was noted in the radial artery.  About the arterial anastomosis flow was noted proximal and distal to the arterial anastomosis.  Distal to the venous anastomosis a Doppler bruit was heard.  Satisfied we ended the case here.   Surgical beds were irrigated copiously.  Hemostasis was again ensured in the surgical beds.  The wounds were closed in layers using 3-0 Vicryl and 4-0 Monocryl.  Dermabond was applied.  Upon  completion of the case instrument and sharps counts were confirmed correct. The patient was transferred to the PACU in good condition. I was present for all portions of the procedure.  [name] Aline. Stanford Breed, MD [facility]  Phone Number: [phone] 04/21/2021 1:47 PM

## 2021-04-22 ENCOUNTER — Encounter (HOSPITAL_COMMUNITY): Payer: Self-pay | Admitting: Vascular Surgery

## 2021-05-03 ENCOUNTER — Other Ambulatory Visit: Payer: Self-pay

## 2021-05-03 DIAGNOSIS — N186 End stage renal disease: Secondary | ICD-10-CM

## 2021-05-04 NOTE — Progress Notes (Signed)
Cardiology Office Note:    Date:  05/05/2021   ID:  [name] Slade Sr., DOB May 28, 1961, MRN [id number]  PCP:  Charlott Rakes, MD  Cardiologist:  Shirlee More, MD    Referring MD: Charlott Rakes, MD    ASSESSMENT:    1. CKD (chronic kidney disease) stage 5, GFR less than 15 ml/min ([place])   2. Hypertensive kidney disease with stage 3 chronic kidney disease, unspecified whether stage 3a or 3b CKD ([place])   3. Bilateral lower extremity edema   4. Bradycardia with 41-50 beats per minute    PLAN:    In order of problems listed above:  He is much improved with renal replacement therapy pleased with the quality of his life and hypertension volume load are being managed with ultrafiltration.  He is still on a low-dose of a loop diuretic and I will leave it to the discretion of nephrology if he needs to continue these agents Stable BP at target and he is much improved with renal replacement therapy Edema improved with ultrafiltration still takes 2 medications in favor lower extremity swelling Apply a 7-day ZIO monitor I do not think he has any evidence of severe conduction system disease   Next appointment: 1 year   Medication Adjustments/Labs and Tests Ordered: Current medicines are reviewed at length with the patient today.  Concerns regarding medicines are outlined above.  No orders of the defined types were placed in this encounter.  No orders of the defined types were placed in this encounter.  Chief complaint follow-up for hypertensive heart disease with volume overload and CKD, also heart rate has been slow on dialysis and the doctors told me to ask you to evaluate   History of Present Illness:    [name] Viens Sr. is a 60 y.o. male with a hx of hypertensive heart disease with CKD and is progressed to stage V with renal replacement therapy type 2 diabetes hyper-K anemia and coronary artery calcification with normal myocardial perfusion study 10/24/2017.  Echocardiogram at that  time showed an EF of 55 to 60%.  He was last seen 09/02/2019.  Compliance with diet, lifestyle and medications: Yes  He is much improved with renal replacement therapy pleased with the quality of his life Heart rate today is 46 and he tells me the concern is been placed on dialysis and bradycardia. Has taken no rate slowing medication has had no palpitation or syncope. He takes several medicines favoring edema including calcium channel blocker and pregabalin and still takes a loop diuretic every other day No shortness of breath chest pain. 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   Myelopathy ([place]) 09/14/2017   Seizures ([place])    Tobacco abuse 09/29/2017   Type 2 diabetes mellitus ([place]) 10/17/2011    Past Surgical History:  Procedure Laterality Date   ANTERIOR CERVICAL DECOMP/DISCECTOMY FUSION N/A 09/14/2017   Procedure: ANTERIOR CERVICAL DECOMPRESSION/DISCECTOMY FUSIONCERVICAL 4- CERVICAL 5, CERVICAL 5- CERVICAL 6;  Surgeon: Kary Kos, MD;  Location: [place];  Service: Neurosurgery;  Laterality: N/A;  ANTERIOR CERVICAL DECOMPRESSION/DISCECTOMY FUSIONCERVICAL 4- CERVICAL 5, CERVICAL 5- CERVICAL 6   AV FISTULA PLACEMENT Left 03/05/2021   Procedure: LEFT UPPER EXTREMITY RADIOBASILIC  ARTERIOVENOUS (AV) FISTULA CREATION;  Surgeon: [name], MD;  Location: [place];  Service: Vascular;  Laterality: Left;  PERIPHERAL NERVE BLOCK   AV FISTULA PLACEMENT Left 04/21/2021   Procedure: INSERTION OF  LEFT ARM ARTERIOVENOUS (AV) GORE-TEX GRAFT;  Surgeon: [name], MD;  Location: [facility];  Service: Vascular;  Laterality: Left;  PERIPHERAL NERVE BLOCK   IR FLUORO GUIDE CV LINE LEFT  03/25/2021    Current Medications: Current Meds  Medication Sig   albuterol (VENTOLIN HFA) 108 (90 Base) MCG/ACT inhaler INHALE 2 PUFFS INTO THE LUNGS EVERY 6 (SIX) HOURS AS NEEDED FOR WHEEZING OR SHORTNESS OF BREATH.   amLODipine (NORVASC) 10 MG tablet Take 1 tablet by mouth at bedtime   AURYXIA 1 GM 210 MG(Fe) tablet Take 210 mg by mouth 3 (three) times daily.   buPROPion (WELLBUTRIN XL) 150 MG 24 hr tablet Take 1 tablet (150 mg total) by mouth daily.   carvedilol (COREG) 3.125 MG tablet 1 (one) Tablet by mouth two times daily   citalopram (CELEXA) 40 MG tablet Take 1 tablet (40 mg total) by mouth daily.   cloNIDine (CATAPRES) 0.2 MG tablet Take 1 tablet (0.2 mg total) by mouth 2 (two) times daily. Please keep 04-06-21 appointment for further refills. 2 nd attempt   ezetimibe (ZETIA) 10 MG tablet Take 1 tablet (10 mg total) by mouth daily. Please keep 04-06-21 appointment for further refills. 2 nd attempt   furosemide (LASIX) 80 MG tablet Take 80 mg by mouth 2 (two) times daily. 3 days a week on non HD days   hydrOXYzine (ATARAX/VISTARIL) 25 MG tablet TAKE 1 TABLET (25 MG TOTAL) BY MOUTH 3 (THREE) TIMES DAILY AS NEEDED FOR ANXIETY.   levETIRAcetam (KEPPRA) 500 MG tablet Take 1 tablet (500 mg total) by mouth 2 (two) times daily.   lisinopril (ZESTRIL) 40 MG tablet TAKE 1 TABLET BY MOUTH DAILY (Patient taking differently: Take 40 mg by mouth daily.)   pregabalin (LYRICA) 25 MG capsule Take 1 capsule (25 mg total) by mouth at bedtime.   rosuvastatin (CRESTOR) 10 MG tablet TAKE 1 TABLET (10 MG TOTAL) BY MOUTH DAILY. (Patient taking differently: Take 10 mg by mouth daily.)      Allergies:   Patient has no known allergies.   Social History   Socioeconomic History   Marital status: Single    Spouse name: Not on file   Number of children: Not on file   Years of education: Not on file   Highest education level: Not on file  Occupational History   Not on file  Tobacco Use   Smoking status: Every Day    Packs/day: 1.00    Years: 42.00    Pack years: 42.00    Types: Cigarettes   Smokeless tobacco: Never   Tobacco comments:    1 pack a day   Vaping Use   Vaping Use: Never used  Substance and Sexual Activity   Alcohol use: Not Currently    Alcohol/week: 10.0 standard drinks    Types: 10 Cans of beer per week    Comment: quit  September 2022   Drug use: Yes    Types: Marijuana    Comment: occasionally   Sexual activity: Not on file  Other Topics Concern   Not on file  Social History Narrative   Not on file   Social Determinants of Health   Financial Resource Strain: Not on file  Food Insecurity: Not on file  Transportation Needs: Not on file  Physical Activity: Not on file  Stress: Not on file  Social Connections: Not on file     Family History: The patient's family history includes Diabetes in his mother; Hypertension in his brother; Kidney cancer in his father; Lung cancer in his father; Scoliosis in his mother. There is no history of Colon cancer, Pancreatic cancer, Prostate cancer, Rectal cancer, or Stomach cancer. ROS:   Please see the history of present illness.    All other systems reviewed and are negative.  EKGs/Labs/Other Studies Reviewed:    The following studies were reviewed today:  EKG:  EKG ordered today and personally reviewed.  The ekg ordered today demonstrates sinus bradycardia 52 bpm first-degree AV block  Recent Labs: 02/17/2021: ALT 17 03/25/2021: Platelets 354 04/21/2021: BUN 23; Creatinine, Ser 5.10; Hemoglobin 12.2; Potassium 3.7; Sodium 136  Recent Lipid Panel    Component Value Date/Time   CHOL 169  02/17/2021 0924   TRIG 115 02/17/2021 0924   HDL 47 02/17/2021 0924   CHOLHDL 3.6 02/17/2021 0924   LDLCALC 101 (H) 02/17/2021 0924    Physical Exam:    VS:  BP 119/74 (BP Location: Right Arm, Patient Position: Sitting, Cuff Size: Normal)   Pulse (!) 46   Ht 6' (1.829 m)   Wt 219 lb 1.6 oz (99.4 kg)   SpO2 100%   BMI 29.72 kg/m     Wt Readings from Last 3 Encounters:  05/05/21 219 lb 1.6 oz (99.4 kg)  04/21/21 219 lb 4.8 oz (99.5 kg)  04/13/21 219 lb 4.8 oz (99.5 kg)     GEN: He looks markedly improved well nourished, well developed in no acute distress HEENT: Normal NECK: No JVD; No carotid bruits LYMPHATICS: No lymphadenopathy CARDIAC: Slow rate RRR, no murmurs, rubs, gallops RESPIRATORY:  Clear to auscultation without rales, wheezing or rhonchi  ABDOMEN: Soft, non-tender, non-distended MUSCULOSKELETAL:  No edema; No deformity  SKIN: Warm and dry NEUROLOGIC:  Alert and oriented x 3 PSYCHIATRIC:  Normal affect    Signed, Shirlee More, MD  05/05/2021 10:21 AM    [place]

## 2021-05-05 ENCOUNTER — Ambulatory Visit (INDEPENDENT_AMBULATORY_CARE_PROVIDER_SITE_OTHER): Payer: Medicare Other | Admitting: Cardiology

## 2021-05-05 ENCOUNTER — Encounter: Payer: Self-pay | Admitting: Cardiology

## 2021-05-05 ENCOUNTER — Other Ambulatory Visit: Payer: Self-pay

## 2021-05-05 ENCOUNTER — Ambulatory Visit (INDEPENDENT_AMBULATORY_CARE_PROVIDER_SITE_OTHER): Payer: Medicare Other

## 2021-05-05 VITALS — BP 119/74 | HR 46 | Ht 72.0 in | Wt 219.1 lb

## 2021-05-05 DIAGNOSIS — R600 Localized edema: Secondary | ICD-10-CM | POA: Diagnosis not present

## 2021-05-05 DIAGNOSIS — R001 Bradycardia, unspecified: Secondary | ICD-10-CM

## 2021-05-05 DIAGNOSIS — E1169 Type 2 diabetes mellitus with other specified complication: Secondary | ICD-10-CM

## 2021-05-05 DIAGNOSIS — N185 Chronic kidney disease, stage 5: Secondary | ICD-10-CM

## 2021-05-05 DIAGNOSIS — I129 Hypertensive chronic kidney disease with stage 1 through stage 4 chronic kidney disease, or unspecified chronic kidney disease: Secondary | ICD-10-CM

## 2021-05-05 DIAGNOSIS — N183 Chronic kidney disease, stage 3 unspecified: Secondary | ICD-10-CM

## 2021-05-05 HISTORY — DX: Bradycardia, unspecified: R00.1

## 2021-05-05 MED ORDER — ROSUVASTATIN CALCIUM 10 MG PO TABS
10.0000 mg | ORAL_TABLET | Freq: Every day | ORAL | 3 refills | Status: DC
Start: 2021-05-05 — End: 2022-05-31
  Filled 2021-05-05 – 2021-10-01 (×5): qty 90, 90d supply, fill #0

## 2021-05-05 MED ORDER — EZETIMIBE 10 MG PO TABS
10.0000 mg | ORAL_TABLET | Freq: Every day | ORAL | 3 refills | Status: DC
  Filled 2021-05-05 – 2021-10-01 (×3): qty 90, 90d supply, fill #0

## 2021-05-05 MED ORDER — CLONIDINE HCL 0.2 MG PO TABS
0.2000 mg | ORAL_TABLET | Freq: Two times a day (BID) | ORAL | 3 refills | Status: DC
  Filled 2021-05-05: qty 180, 90d supply, fill #0

## 2021-05-05 NOTE — Patient Instructions (Signed)
Medication Instructions:  °Your physician recommends that you continue on your current medications as directed. Please refer to the Current Medication list given to you today. ° °*If you need a refill on your cardiac medications before your next appointment, please call your pharmacy* ° ° °Lab Work: °None °If you have labs (blood work) drawn today and your tests are completely normal, you will receive your results only by: °MyChart Message (if you have MyChart) OR °A paper copy in the mail °If you have any lab test that is abnormal or we need to change your treatment, we will call you to review the results. ° ° °Testing/Procedures: °A zio monitor was ordered today. It will remain on for 7 days. You will then return monitor and event diary in provided box. It takes 1-2 weeks for report to be downloaded and returned to us. We will call you with the results. If monitor falls off or has orange flashing light, please call Zio for further instructions.  ° ° ° °Follow-Up: °At [facility], you and your health needs are our priority.  As part of our continuing mission to provide you with exceptional heart care, we have created designated Provider Care Teams.  These Care Teams include your primary Cardiologist (physician) and Advanced Practice Providers (APPs -  Physician Assistants and Nurse Practitioners) who all work together to provide you with the care you need, when you need it. ° °We recommend signing up for the patient portal called "MyChart".  Sign up information is provided on this After Visit Summary.  MyChart is used to connect with patients for Virtual Visits (Telemedicine).  Patients are able to view lab/test results, encounter notes, upcoming appointments, etc.  Non-urgent messages can be sent to your provider as well.   °To learn more about what you can do with MyChart, go to [email or web address].   ° °Your next appointment:   °1 year(s) ° °The format for your next appointment:   °In Person ° °Provider:    °Brian Munley, MD  ° ° °Other Instructions ° ° °

## 2021-05-10 ENCOUNTER — Other Ambulatory Visit: Payer: Self-pay

## 2021-05-13 DIAGNOSIS — R001 Bradycardia, unspecified: Secondary | ICD-10-CM

## 2021-05-18 ENCOUNTER — Other Ambulatory Visit: Payer: Self-pay

## 2021-05-18 ENCOUNTER — Ambulatory Visit: Payer: Medicare Other | Attending: Family Medicine | Admitting: Family Medicine

## 2021-05-18 ENCOUNTER — Encounter: Payer: Self-pay | Admitting: Family Medicine

## 2021-05-18 VITALS — BP 113/72 | HR 60 | Ht 72.0 in | Wt 221.8 lb

## 2021-05-18 DIAGNOSIS — K08139 Complete loss of teeth due to caries, unspecified class: Secondary | ICD-10-CM

## 2021-05-18 DIAGNOSIS — E1122 Type 2 diabetes mellitus with diabetic chronic kidney disease: Secondary | ICD-10-CM

## 2021-05-18 DIAGNOSIS — N186 End stage renal disease: Secondary | ICD-10-CM

## 2021-05-18 DIAGNOSIS — Z992 Dependence on renal dialysis: Secondary | ICD-10-CM

## 2021-05-18 DIAGNOSIS — R569 Unspecified convulsions: Secondary | ICD-10-CM

## 2021-05-18 DIAGNOSIS — J438 Other emphysema: Secondary | ICD-10-CM

## 2021-05-18 DIAGNOSIS — N185 Chronic kidney disease, stage 5: Secondary | ICD-10-CM

## 2021-05-18 DIAGNOSIS — E1169 Type 2 diabetes mellitus with other specified complication: Secondary | ICD-10-CM

## 2021-05-18 DIAGNOSIS — M7989 Other specified soft tissue disorders: Secondary | ICD-10-CM

## 2021-05-18 DIAGNOSIS — F419 Anxiety disorder, unspecified: Secondary | ICD-10-CM

## 2021-05-18 DIAGNOSIS — F32A Depression, unspecified: Secondary | ICD-10-CM

## 2021-05-18 DIAGNOSIS — I120 Hypertensive chronic kidney disease with stage 5 chronic kidney disease or end stage renal disease: Secondary | ICD-10-CM

## 2021-05-18 DIAGNOSIS — H6122 Impacted cerumen, left ear: Secondary | ICD-10-CM

## 2021-05-18 LAB — POCT GLYCOSYLATED HEMOGLOBIN (HGB A1C): HbA1c, POC (controlled diabetic range): 6.6 % (ref 0.0–7.0)

## 2021-05-18 LAB — GLUCOSE, POCT (MANUAL RESULT ENTRY): POC Glucose: 201 mg/dl — AB (ref 70–99)

## 2021-05-18 MED ORDER — CITALOPRAM HYDROBROMIDE 40 MG PO TABS
40.0000 mg | ORAL_TABLET | Freq: Every day | ORAL | 1 refills | Status: DC
  Filled 2021-05-18 – 2021-07-12 (×2): qty 90, 90d supply, fill #0

## 2021-05-18 MED ORDER — ACCU-CHEK GUIDE VI STRP
ORAL_STRIP | 12 refills | Status: DC
  Filled 2021-05-18 – 2021-09-03 (×2): qty 100, 33d supply, fill #0

## 2021-05-18 MED ORDER — FLUTICASONE-SALMETEROL 250-50 MCG/ACT IN AEPB
1.0000 | INHALATION_SPRAY | Freq: Two times a day (BID) | RESPIRATORY_TRACT | 6 refills | Status: DC
  Filled 2021-05-18 – 2021-07-12 (×2): qty 60, 30d supply, fill #0
  Filled 2021-09-03 – 2021-10-01 (×2): qty 60, 30d supply, fill #1

## 2021-05-18 MED ORDER — LEVETIRACETAM 500 MG PO TABS
500.0000 mg | ORAL_TABLET | Freq: Two times a day (BID) | ORAL | 1 refills | Status: DC
  Filled 2021-05-18 – 2021-10-01 (×4): qty 180, 90d supply, fill #0

## 2021-05-18 MED ORDER — ACCU-CHEK GUIDE W/DEVICE KIT
PACK | 0 refills | Status: DC
Start: 2021-05-18 — End: 2022-02-03
  Filled 2021-05-18: qty 1, fill #0

## 2021-05-18 MED ORDER — ACCU-CHEK SOFTCLIX LANCETS MISC
12 refills | Status: DC
  Filled 2021-05-18: qty 100, fill #0

## 2021-05-18 MED ORDER — ZOSTER VAC RECOMB ADJUVANTED 50 MCG/0.5ML IM SUSR: 0.5000 mL | Freq: Once | INTRAMUSCULAR | 1 refills | Status: AC

## 2021-05-18 MED ORDER — LISINOPRIL 40 MG PO TABS
ORAL_TABLET | Freq: Every day | ORAL | 1 refills | Status: DC
  Filled 2021-05-18 – 2021-10-01 (×3): qty 90, 90d supply, fill #0

## 2021-05-18 MED ORDER — MISC. DEVICES MISC: 0 refills | Status: DC

## 2021-05-18 NOTE — Patient Instructions (Signed)
Earwax Buildup, Adult ?The ears produce a substance called earwax that helps keep bacteria out of the ear and protects the skin in the ear canal. Occasionally, earwax can build up in the ear and cause discomfort or hearing loss. ?What are the causes? ?This condition is caused by a buildup of earwax. Ear canals are self-cleaning. Ear wax is made in the outer part of the ear canal and generally falls out in small amounts over time. ?When the self-cleaning mechanism is not working, earwax builds up and can cause decreased hearing and discomfort. Attempting to clean ears with cotton swabs can push the earwax deep into the ear canal and cause decreased hearing and pain. ?What increases the risk? ?This condition is more likely to develop in people who: ?Clean their ears often with cotton swabs. ?Pick at their ears. ?Use earplugs or in-ear headphones often, or wear hearing aids. ?The following factors may also make you more likely to develop this condition: ?Being male. ?Being of older age. ?Naturally producing more earwax. ?Having narrow ear canals. ?Having earwax that is overly thick or sticky. ?Having excess hair in the ear canal. ?Having eczema. ?Being dehydrated. ?What are the signs or symptoms? ?Symptoms of this condition include: ?Reduced or muffled hearing. ?A feeling of fullness in the ear or feeling that the ear is plugged. ?Fluid coming from the ear. ?Ear pain or an itchy ear. ?Ringing in the ear. ?Coughing. ?Balance problems. ?An obvious piece of earwax that can be seen inside the ear canal. ?How is this diagnosed? ?This condition may be diagnosed based on: ?Your symptoms. ?Your medical history. ?An ear exam. During the exam, your health care provider will look into your ear with an instrument called an otoscope. ?You may have tests, including a hearing test. ?How is this treated? ?This condition may be treated by: ?Using ear drops to soften the earwax. ?Having the earwax removed by a health care provider. The  health care provider may: ?Flush the ear with water. ?Use an instrument that has a loop on the end (curette). ?Use a suction device. ?Having surgery to remove the wax buildup. This may be done in severe cases. ?Follow these instructions at home: ? ?Take over-the-counter and prescription medicines only as told by your health care provider. ?Do not put any objects, including cotton swabs, into your ear. You can clean the opening of your ear canal with a washcloth or facial tissue. ?Follow instructions from your health care provider about cleaning your ears. Do not overclean your ears. ?Drink enough fluid to keep your urine pale yellow. This will help to thin the earwax. ?Keep all follow-up visits as told. If earwax builds up in your ears often or if you use hearing aids, consider seeing your health care provider for routine, preventive ear cleanings. Ask your health care provider how often you should schedule your cleanings. ?If you have hearing aids, clean them according to instructions from the manufacturer and your health care provider. ?Contact a health care provider if: ?You have ear pain. ?You develop a fever. ?You have pus or other fluid coming from your ear. ?You have hearing loss. ?You have ringing in your ears that does not go away. ?You feel like the room is spinning (vertigo). ?Your symptoms do not improve with treatment. ?Get help right away if: ?You have bleeding from the affected ear. ?You have severe ear pain. ?Summary ?Earwax can build up in the ear and cause discomfort or hearing loss. ?The most common symptoms of this condition include   reduced or muffled hearing, a feeling of fullness in the ear, or feeling that the ear is plugged. ?This condition may be diagnosed based on your symptoms, your medical history, and an ear exam. ?This condition may be treated by using ear drops to soften the earwax or by having the earwax removed by a health care provider. ?Do not put any objects, including cotton  swabs, into your ear. You can clean the opening of your ear canal with a washcloth or facial tissue. ?This information is not intended to replace advice given to you by your health care provider. Make sure you discuss any questions you have with your health care provider. ?Document Revised: 10/01/2019 Document Reviewed: 10/01/2019 ?Elsevier Patient Education ? 2022 [employer] ? ?

## 2021-05-18 NOTE — Progress Notes (Signed)
Subjective:  Patient ID: [name] Slade Sr., male    DOB: 1961-03-22  Age: 60 y.o. MRN: [id number]  CC: Diabetes   HPI [name] Eyerman Sr. is a 60 y.o. year old male with a history of type 2 diabetes mellitus (diet-controlled A1c 6.6), Seizures, ESRD (on HD T, Th, Sat) Fibrillary glomerulonephritis,, alcohol abuse, hypertension, alcohol abuse, coronary artery calcifications, ACDF of C4,5 and C5,6 on 09/14/17  By Dr Saintclair Halsted secondary to Myelopathy seen for a follow-up visit.  Interval History: His L arm fistula does not work and it does not feel painful but is tight and itchy.  He denies presence of swelling of the left arm or warmth. Started HD 6 weeks ago. He has cramping after HD sessions His BP drops during HD so he has to hold off his antihypertensives until he returns home from HD His ears are stopped up and he can hardly hear out of both ears. Feels like he has water in his ears. Also endorses presence of post nasal drip  Diabetes is diet controlled he denies hypoglycemia.  Seizures have been stable.  He has no chest pain, dyspnea or pedal edema. He quit drinking 10 weeks; working on cutting down on smoking and does 1 pack/day. He needs a new set of dentures. Past Medical History:  Diagnosis Date   Anemia    Anxiety    Arthritis    CAD (coronary artery disease) 10/06/2017   CHF (congestive heart failure) (HCC)    CKD (chronic kidney disease) stage 5, GFR less than 15 ml/min (HCC)    dialysis T/Th/Sa   COPD (chronic obstructive pulmonary disease) ([place]) 09/29/2017   Cough    Depression    Dyspnea    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   Heart murmur    Hypertension 09/29/2017   Immunosuppression due to drug therapy ([place]) 05/21/2019   On Rituximab for Fibrillary GN   Myelopathy ([place]) 09/14/2017   Seizures ([place])    Tobacco abuse 09/29/2017   Type 2 diabetes mellitus ([place]) 10/17/2011    Past Surgical History:  Procedure Laterality Date   ANTERIOR CERVICAL DECOMP/DISCECTOMY FUSION  N/A 09/14/2017   Procedure: ANTERIOR CERVICAL DECOMPRESSION/DISCECTOMY FUSIONCERVICAL 4- CERVICAL 5, CERVICAL 5- CERVICAL 6;  Surgeon: Kary Kos, MD;  Location: [facility];  Service: Neurosurgery;  Laterality: N/A;  ANTERIOR CERVICAL DECOMPRESSION/DISCECTOMY FUSIONCERVICAL 4- CERVICAL 5, CERVICAL 5- CERVICAL 6   AV FISTULA PLACEMENT Left 03/05/2021   Procedure: LEFT UPPER EXTREMITY RADIOBASILIC  ARTERIOVENOUS (AV) FISTULA CREATION;  Surgeon: [name], MD;  Location: [facility];  Service: Vascular;  Laterality: Left;  PERIPHERAL NERVE BLOCK   AV FISTULA PLACEMENT Left 04/21/2021   Procedure: INSERTION OF  LEFT ARM ARTERIOVENOUS (AV) GORE-TEX GRAFT;  Surgeon: [name], MD;  Location: [facility];  Service: Vascular;  Laterality: Left;  PERIPHERAL NERVE BLOCK   IR FLUORO GUIDE CV LINE LEFT  03/25/2021    Family History  Problem Relation Age of Onset   Diabetes Mother    Scoliosis Mother    Kidney cancer Father    Lung cancer Father    Hypertension Brother    Colon cancer Neg Hx    Pancreatic cancer Neg Hx    Prostate cancer Neg Hx    Rectal cancer Neg Hx    Stomach cancer Neg Hx     No Known Allergies  Outpatient Medications Prior to Visit  Medication Sig Dispense Refill   albuterol (VENTOLIN HFA) 108 (90 Base) MCG/ACT inhaler INHALE 2 PUFFS INTO THE LUNGS  EVERY 6 (SIX) HOURS AS NEEDED FOR WHEEZING OR SHORTNESS OF BREATH. 18 g 6   amLODipine (NORVASC) 10 MG tablet Take 1 tablet by mouth at bedtime 30 tablet 5   AURYXIA 1 GM 210 MG(Fe) tablet Take 210 mg by mouth 3 (three) times daily.     cloNIDine (CATAPRES) 0.2 MG tablet Take 1 tablet (0.2 mg total) by mouth 2 (two) times daily. Please keep 04-06-21 appointment for further refills. 2 nd attempt 180 tablet 3   ezetimibe (ZETIA) 10 MG tablet Take 1 tablet (10 mg total) by mouth daily. 90 tablet 3   furosemide (LASIX) 80 MG tablet Take 80 mg by mouth 2 (two) times daily. 3 days a week on non HD days     hydrOXYzine (ATARAX/VISTARIL) 25 MG  tablet TAKE 1 TABLET (25 MG TOTAL) BY MOUTH 3 (THREE) TIMES DAILY AS NEEDED FOR ANXIETY. 90 tablet 3   Polyethyl Glycol-Propyl Glycol (SYSTANE OP) Place 1 drop into both eyes daily.     pregabalin (LYRICA) 25 MG capsule Take 1 capsule (25 mg total) by mouth at bedtime. 30 capsule 8   rosuvastatin (CRESTOR) 10 MG tablet Take 1 tablet (10 mg total) by mouth daily. 90 tablet 3   sodium zirconium cyclosilicate (LOKELMA) 10 g PACK packet TAKE 1 PACKET BY MOUTH DAILY 30 each 3   carvedilol (COREG) 3.125 MG tablet 1 (one) Tablet by mouth two times daily 60 tablet 6   citalopram (CELEXA) 40 MG tablet Take 1 tablet (40 mg total) by mouth daily. 30 tablet 2   levETIRAcetam (KEPPRA) 500 MG tablet Take 1 tablet (500 mg total) by mouth 2 (two) times daily. 60 tablet 2   buPROPion (WELLBUTRIN XL) 150 MG 24 hr tablet Take 1 tablet (150 mg total) by mouth daily. (Patient not taking: Reported on 05/18/2021) 30 tablet 2   lidocaine (LIDODERM) 5 % Place 1 patch onto the skin daily. Remove & Discard patch within 12 hours or as directed by MD (Patient not taking: Reported on 05/05/2021) 30 patch 3   oxyCODONE-acetaminophen (PERCOCET) 10-325 MG tablet Take 1 tablet by mouth every 6 (six) hours as needed for pain. (Patient not taking: Reported on 05/05/2021) 20 tablet 0   fluticasone-salmeterol (ADVAIR) 250-50 MCG/ACT AEPB Inhale 1 puff into the lungs 2 (two) times daily. 60 each 6   lisinopril (ZESTRIL) 40 MG tablet TAKE 1 TABLET BY MOUTH DAILY (Patient not taking: Reported on 05/18/2021) 90 tablet 3   No facility-administered medications prior to visit.     ROS Review of Systems  Constitutional:  Negative for activity change and appetite change.  HENT:  Positive for hearing loss. Negative for sinus pressure and sore throat.   Eyes:  Negative for visual disturbance.  Respiratory:  Negative for cough, chest tightness and shortness of breath.   Cardiovascular:  Negative for chest pain and leg swelling.   Gastrointestinal:  Negative for abdominal distention, abdominal pain, constipation and diarrhea.  Endocrine: Negative.   Genitourinary:  Negative for dysuria.  Musculoskeletal:  Negative for joint swelling and myalgias.  Skin:  Negative for rash.  Allergic/Immunologic: Negative.   Neurological:  Negative for weakness, light-headedness and numbness.  Psychiatric/Behavioral:  Negative for dysphoric mood and suicidal ideas.    Objective:  BP 113/72   Pulse 60   Ht 6' (1.829 m)   Wt 221 lb 12.8 oz (100.6 kg)   SpO2 99%   BMI 30.08 kg/m   BP/Weight 05/18/2021 05/05/2021 40/34/7425  Systolic BP 956 387 94  Diastolic BP 72 74 68  Wt. (Lbs) 221.8 219.1 219.3  BMI 30.08 29.72 29.74      Physical Exam Constitutional:      Appearance: He is well-developed.  HENT:     Left Ear: There is impacted cerumen.  Cardiovascular:     Rate and Rhythm: Normal rate.     Heart sounds: Normal heart sounds. No murmur heard.    Comments: Permacath in right upper chest wall Pulmonary:     Effort: Pulmonary effort is normal.     Breath sounds: Normal breath sounds. No wheezing or rales.  Chest:     Chest wall: No tenderness.  Abdominal:     General: Bowel sounds are normal. There is no distension.     Palpations: Abdomen is soft. There is no mass.     Tenderness: There is no abdominal tenderness.  Musculoskeletal:        General: Normal range of motion.     Right lower leg: No edema.     Left lower leg: No edema.     Comments: Medial aspect of left upper arm with hard greenish coloration at site of AV graft  Neurological:     Mental Status: He is alert and oriented to person, place, and time.  Psychiatric:        Mood and Affect: Mood normal.    CMP Latest Ref Rng & Units 04/21/2021 03/25/2021 03/05/2021  Glucose 70 - 99 mg/dL 142(H) 147(H) 160(H)  BUN 8 - 23 mg/dL 23 90(H) 109(H)  Creatinine 0.61 - 1.24 mg/dL 5.10(H) 6.81(H) 7.40(H)  Sodium 135 - 145 mmol/L 136 139 128(L)  Potassium 3.5  - 5.1 mmol/L 3.7 5.2(H) 4.6  Chloride 98 - 111 mmol/L 96(L) 109 99  CO2 22 - 32 mmol/L - 18(L) -  Calcium 8.9 - 10.3 mg/dL - 8.7(L) -  Total Protein 6.0 - 8.5 g/dL - - -  Total Bilirubin 0.0 - 1.2 mg/dL - - -  Alkaline Phos 44 - 121 IU/L - - -  AST 0 - 40 IU/L - - -  ALT 0 - 44 IU/L - - -    Lipid Panel     Component Value Date/Time   CHOL 169 02/17/2021 0924   TRIG 115 02/17/2021 0924   HDL 47 02/17/2021 0924   CHOLHDL 3.6 02/17/2021 0924   LDLCALC 101 (H) 02/17/2021 0924    CBC    Component Value Date/Time   WBC 11.8 (H) 03/25/2021 0830   RBC 4.03 (L) 03/25/2021 0830   HGB 12.2 (L) 04/21/2021 0802   HCT 36.0 (L) 04/21/2021 0802   PLT 354 03/25/2021 0830   MCV 92.1 03/25/2021 0830   MCH 30.3 03/25/2021 0830   MCHC 32.9 03/25/2021 0830   RDW 12.7 03/25/2021 0830   LYMPHSABS 1.3 03/25/2021 0830   MONOABS 1.1 (H) 03/25/2021 0830   EOSABS 0.5 03/25/2021 0830   BASOSABS 0.1 03/25/2021 0830    Lab Results  Component Value Date   HGBA1C 6.6 05/18/2021    Assessment & Plan:  1. Type 2 diabetes mellitus with chronic kidney disease on chronic dialysis, without long-term current use of insulin (HCC) Diet controlled with A1c of 6.6 Counseled on Diabetic diet, my plate method, 845 minutes of moderate intensity exercise/week Blood sugar logs with fasting goals of 80-120 mg/dl, random of less than 180 and in the event of sugars less than 60 mg/dl or greater than 400 mg/dl encouraged to notify the clinic. Advised on the need for annual  eye exams, annual foot exams, Pneumonia vaccine. - POCT glucose (manual entry) - POCT glycosylated hemoglobin (Hb A1C) - Misc. Devices MISC; Blood pressure monitor.  Diagnosis-hypertension.  Dispense: 1 each; Refill: 0 - glucose blood (ACCU-CHEK GUIDE) test strip; Use as instructed 3 times daily  Dispense: 100 each; Refill: 12 - Blood Glucose Monitoring Suppl (ACCU-CHEK GUIDE) w/Device KIT; Use as directed tid  Dispense: 1 kit; Refill: 0 -  Accu-Chek Softclix Lancets lancets; Use as instructed tid before meals  Dispense: 100 each; Refill: 12  2. Anxiety and depression Controlled - citalopram (CELEXA) 40 MG tablet; Take 1 tablet (40 mg total) by mouth daily.  Dispense: 90 tablet; Refill: 1  3. Seizure (HCC) Stable - levETIRAcetam (KEPPRA) 500 MG tablet; Take 1 tablet (500 mg total) by mouth 2 (two) times daily.  Dispense: 180 tablet; Refill: 1  4. Other emphysema (HCC) Asymptomatic - fluticasone-salmeterol (ADVAIR) 250-50 MCG/ACT AEPB; Inhale 1 puff into the lungs 2 (two) times daily.  Dispense: 60 each; Refill: 6  5. Loss of teeth due to dental caries, unspecified edentulism class He needs new dentures - Ambulatory referral to Dentistry  6. Hypertension associated with stage 5 chronic kidney disease due to type 2 diabetes mellitus (HCC) Controlled Due to hypotension during hemodialysis sessions I have discontinued carvedilol - lisinopril (ZESTRIL) 40 MG tablet; TAKE 1 TABLET BY MOUTH DAILY  Dispense: 90 tablet; Refill: 1  7. Impacted cerumen of left ear Irrigation performed  8. Left arm swelling At the site of his AV fistula Will need to exclude DVT of left upper extremity -Doppler ordered Notify the clinic if he develops swelling, redness or fever. Advised to keep upcoming appointment with vascular surgery meanwhile I will order a Doppler - VAS [place] UPPER EXTREMITY VENOUS DUPLEX; Future   Meds ordered this encounter  Medications   citalopram (CELEXA) 40 MG tablet    Sig: Take 1 tablet (40 mg total) by mouth daily.    Dispense:  90 tablet    Refill:  1   levETIRAcetam (KEPPRA) 500 MG tablet    Sig: Take 1 tablet (500 mg total) by mouth 2 (two) times daily.    Dispense:  180 tablet    Refill:  1   lisinopril (ZESTRIL) 40 MG tablet    Sig: TAKE 1 TABLET BY MOUTH DAILY    Dispense:  90 tablet    Refill:  1   fluticasone-salmeterol (ADVAIR) 250-50 MCG/ACT AEPB    Sig: Inhale 1 puff into the lungs 2 (two) times  daily.    Dispense:  60 each    Refill:  6   Misc. Devices MISC    Sig: Blood pressure monitor.  Diagnosis-hypertension.    Dispense:  1 each    Refill:  0   glucose blood (ACCU-CHEK GUIDE) test strip    Sig: Use as instructed 3 times daily    Dispense:  100 each    Refill:  12   Blood Glucose Monitoring Suppl (ACCU-CHEK GUIDE) w/Device KIT    Sig: Use as directed tid    Dispense:  1 kit    Refill:  0   Accu-Chek Softclix Lancets lancets    Sig: Use as instructed tid before meals    Dispense:  100 each    Refill:  12   Zoster Vaccine Adjuvanted [facility]) injection    Sig: Inject 0.5 mLs into the muscle once for 1 dose.    Dispense:  0.5 mL    Refill:  1  49 minutes of total face to face time spent including median intraservice time reviewing previous notes and test results, counseling patient on left upper extremity symptoms in addition to management of chronic medical conditions.Time also spent ordering medications, investigations and documenting in the chart.  All questions were answered to the patient's satisfaction   Follow-up: Return in about 6 months (around 11/15/2021) for Chronic medical conditions.       Charlott Rakes, MD, FAAFP. [facility] and [facility] [place], [place]   05/18/2021, 12:28 PM

## 2021-05-19 ENCOUNTER — Other Ambulatory Visit: Payer: Self-pay

## 2021-05-19 DIAGNOSIS — Z992 Dependence on renal dialysis: Secondary | ICD-10-CM

## 2021-05-19 DIAGNOSIS — N186 End stage renal disease: Secondary | ICD-10-CM

## 2021-05-24 ENCOUNTER — Ambulatory Visit (INDEPENDENT_AMBULATORY_CARE_PROVIDER_SITE_OTHER): Payer: Medicare Other | Admitting: Physician Assistant

## 2021-05-24 ENCOUNTER — Other Ambulatory Visit: Payer: Self-pay

## 2021-05-24 ENCOUNTER — Ambulatory Visit (HOSPITAL_COMMUNITY): Payer: Medicare Other

## 2021-05-24 ENCOUNTER — Ambulatory Visit (HOSPITAL_COMMUNITY)
Admission: RE | Admit: 2021-05-24 | Discharge: 2021-05-24 | Disposition: A | Discharge status: Home / Self Care | Payer: Medicare Other | Source: Ambulatory Visit | Attending: Surgery | Admitting: Surgery

## 2021-05-24 ENCOUNTER — Ambulatory Visit (INDEPENDENT_AMBULATORY_CARE_PROVIDER_SITE_OTHER)
Admission: RE | Admit: 2021-05-24 | Discharge: 2021-05-24 | Disposition: A | Discharge status: Home / Self Care | Payer: Medicare Other | Source: Ambulatory Visit | Attending: Surgery | Admitting: Surgery

## 2021-05-24 VITALS — BP 122/77 | HR 62 | Temp 98.2°F | Resp 20 | Ht 72.0 in | Wt 217.6 lb

## 2021-05-24 DIAGNOSIS — N186 End stage renal disease: Secondary | ICD-10-CM

## 2021-05-24 DIAGNOSIS — Z992 Dependence on renal dialysis: Secondary | ICD-10-CM

## 2021-05-24 NOTE — Progress Notes (Signed)
POST OPERATIVE OFFICE NOTE    CC:  F/u for surgery  HPI:  This is a 60 y.o. male who is s/p LUE AVG on 04/21/2021 by Dr. Stanford Breed.  He initially had a left radio-basilic AVF created on 08/01/971 but this thrombosed.   He has right TDC that was placed on 03/25/2021 by IR.   Pt states he does  have numbness in the left arm and hand but has had this since his neck surgery.  This has not worsened.  He states that after surgery, he could feel the buzz in the graft but has not been able to feel it for about 2 weeks.  He was scheduled today for vein mapping as his LUA AVG has clotted.    The pt is on dialysis T/T/S at [place] location.   No Known Allergies  Current Outpatient Medications  Medication Sig Dispense Refill   Accu-Chek Softclix Lancets lancets Use as instructed tid before meals 100 each 12   albuterol (VENTOLIN HFA) 108 (90 Base) MCG/ACT inhaler INHALE 2 PUFFS INTO THE LUNGS EVERY 6 (SIX) HOURS AS NEEDED FOR WHEEZING OR SHORTNESS OF BREATH. 18 g 6   amLODipine (NORVASC) 10 MG tablet Take 1 tablet by mouth at bedtime 30 tablet 5   AURYXIA 1 GM 210 MG(Fe) tablet Take 210 mg by mouth 3 (three) times daily.     Blood Glucose Monitoring Suppl (ACCU-CHEK GUIDE) w/Device KIT Use as directed tid 1 kit 0   buPROPion (WELLBUTRIN XL) 150 MG 24 hr tablet Take 1 tablet (150 mg total) by mouth daily. (Patient not taking: Reported on 05/18/2021) 30 tablet 2   citalopram (CELEXA) 40 MG tablet Take 1 tablet (40 mg total) by mouth daily. 90 tablet 1   cloNIDine (CATAPRES) 0.2 MG tablet Take 1 tablet (0.2 mg total) by mouth 2 (two) times daily. Please keep 04-06-21 appointment for further refills. 2 nd attempt 180 tablet 3   ezetimibe (ZETIA) 10 MG tablet Take 1 tablet (10 mg total) by mouth daily. 90 tablet 3   fluticasone-salmeterol (ADVAIR) 250-50 MCG/ACT AEPB Inhale 1 puff into the lungs 2 (two) times daily. 60 each 6   furosemide (LASIX) 80 MG tablet Take 80 mg by mouth 2 (two) times daily. 3 days a  week on non HD days     glucose blood (ACCU-CHEK GUIDE) test strip Use as instructed 3 times daily 100 each 12   hydrOXYzine (ATARAX/VISTARIL) 25 MG tablet TAKE 1 TABLET (25 MG TOTAL) BY MOUTH 3 (THREE) TIMES DAILY AS NEEDED FOR ANXIETY. 90 tablet 3   levETIRAcetam (KEPPRA) 500 MG tablet Take 1 tablet (500 mg total) by mouth 2 (two) times daily. 180 tablet 1   lidocaine (LIDODERM) 5 % Place 1 patch onto the skin daily. Remove & Discard patch within 12 hours or as directed by MD (Patient not taking: Reported on 05/05/2021) 30 patch 3   lisinopril (ZESTRIL) 40 MG tablet TAKE 1 TABLET BY MOUTH DAILY 90 tablet 1   Misc. Devices MISC Blood pressure monitor.  Diagnosis-hypertension. 1 each 0   oxyCODONE-acetaminophen (PERCOCET) 10-325 MG tablet Take 1 tablet by mouth every 6 (six) hours as needed for pain. (Patient not taking: Reported on 05/05/2021) 20 tablet 0   Polyethyl Glycol-Propyl Glycol (SYSTANE OP) Place 1 drop into both eyes daily.     pregabalin (LYRICA) 25 MG capsule Take 1 capsule (25 mg total) by mouth at bedtime. 30 capsule 8   rosuvastatin (CRESTOR) 10 MG tablet Take 1 tablet (  10 mg total) by mouth daily. 90 tablet 3   sodium zirconium cyclosilicate (LOKELMA) 10 g PACK packet TAKE 1 PACKET BY MOUTH DAILY 30 each 3   No current facility-administered medications for this visit.     ROS:  See HPI  Physical Exam:  Today's Vitals   05/24/21 1004  BP: 122/77  Pulse: 62  Resp: 20  Temp: 98.2 F (36.8 C)  TempSrc: Temporal  SpO2: 97%  Weight: 217 lb 9.6 oz (98.7 kg)  Height: 6' (1.829 m)  PainSc: 3    Body mass index is 29.51 kg/m.   Incision:  both have healed nicely Extremities:   There is a palpable left radial pulse.   Motor and sensory are in tact.   There is not a thrill/bruit present.  The graft is thrombosed.    Vein mapping on 05/24/2021: +-----------------+-------------+----------+--------+  Right Cephalic   Diameter (cm)Depth (cm)Findings   +-----------------+-------------+----------+--------+  Shoulder             0.25        0.32             +-----------------+-------------+----------+--------+  Prox upper arm       0.27        0.35             +-----------------+-------------+----------+--------+  Mid upper arm        0.30        0.35             +-----------------+-------------+----------+--------+  Dist upper arm       0.33        0.42             +-----------------+-------------+----------+--------+  Antecubital fossa    0.24        0.27             +-----------------+-------------+----------+--------+  Prox forearm         0.23        0.25             +-----------------+-------------+----------+--------+  Mid forearm          0.24        0.46             +-----------------+-------------+----------+--------+  Dist forearm         0.25        0.39             +-----------------+-------------+----------+--------+  Wrist                0.19        0.26             +-----------------+-------------+----------+--------+   +-----------------+-------------+----------+---------+  Right Basilic    Diameter (cm)Depth (cm)Findings   +-----------------+-------------+----------+---------+  Mid upper arm        0.48        1.24              +-----------------+-------------+----------+---------+  Dist upper arm       0.50        0.98   branching  +-----------------+-------------+----------+---------+  Antecubital fossa    0.43        0.75              +-----------------+-------------+----------+---------+  Prox forearm         0.21        0.37              +-----------------+-------------+----------+---------+  Mid forearm  0.12        0.27   branching  +-----------------+-------------+----------+---------+  Distal forearm       0.10        0.27              +-----------------+-------------+----------+---------+  Wrist                 0.08        0.31              +-----------------+-------------+----------+---------+   +-----------------+-------------+----------+---------+  Left Cephalic    Diameter (cm)Depth (cm)Findings   +-----------------+-------------+----------+---------+  Shoulder             0.22        0.27              +-----------------+-------------+----------+---------+  Prox upper arm       0.20        0.22              +-----------------+-------------+----------+---------+  Mid upper arm        0.19        0.27              +-----------------+-------------+----------+---------+  Dist upper arm       0.22        0.63              +-----------------+-------------+----------+---------+  Antecubital fossa    0.23        0.58              +-----------------+-------------+----------+---------+  Prox forearm         0.21        0.30              +-----------------+-------------+----------+---------+  Mid forearm          0.29        0.39   branching  +-----------------+-------------+----------+---------+  Dist forearm         0.20        0.34              +-----------------+-------------+----------+---------+  Wrist                0.18        0.37   branching  +-----------------+-------------+----------+---------+   +-----------------+-------------+----------+--------------+  Left Basilic     Diameter (cm)Depth (cm)   Findings     +-----------------+-------------+----------+--------------+  Mid upper arm        0.33        1.39                   +-----------------+-------------+----------+--------------+  Dist upper arm       0.26        1.09                   +-----------------+-------------+----------+--------------+  Antecubital fossa    0.15        0.73                   +-----------------+-------------+----------+--------------+  Prox forearm         0.11        0.53                    +-----------------+-------------+----------+--------------+  Mid forearm                             not visualized  +-----------------+-------------+----------+--------------+  Distal forearm                          not visualized  +-----------------+-------------+----------+--------------+  Wrist                                   not visualized  +-----------------+-------------+----------+--------------+    Assessment/Plan:  This is a 60 y.o. male who is s/p: LUE AVG on 04/21/2021 by Dr. Stanford Breed.  He initially had a left radio-basilic AVF created on 10/28/6142 but this thrombosed.   He has [place] that was placed on 03/25/2021 by IR.   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   -given the pt has had 2 failed accesses over the past couple of months, will schedule him for a bilateral venogram as soon as it can be scheduled before proceeding with new access.  The pt is in agreement with this plan.   -he is right arm dominant especially in light of the numbness he has after neck surgery -pt states that his catheter has been acting up.  Says it has been draining clear fluid around it.  I cannot express any fluid.  Does not appear infected.   -discussed with pt that access does not last forever and will need intervention or even new access at some point.     Leontine Locket, [facility] [phone]  Clinic MD:  Trula Slade

## 2021-05-24 NOTE — H&P (View-Only) (Signed)
POST OPERATIVE OFFICE NOTE    CC:  F/u for surgery  HPI:  This is a 60 y.o. male who is s/p LUE AVG on 04/21/2021 by Dr. Stanford Breed.  He initially had a left radio-basilic AVF created on 08/01/971 but this thrombosed.   He has right TDC that was placed on 03/25/2021 by IR.   Pt states he does  have numbness in the left arm and hand but has had this since his neck surgery.  This has not worsened.  He states that after surgery, he could feel the buzz in the graft but has not been able to feel it for about 2 weeks.  He was scheduled today for vein mapping as his LUA AVG has clotted.    The pt is on dialysis T/T/S at [place] location.   No Known Allergies  Current Outpatient Medications  Medication Sig Dispense Refill   Accu-Chek Softclix Lancets lancets Use as instructed tid before meals 100 each 12   albuterol (VENTOLIN HFA) 108 (90 Base) MCG/ACT inhaler INHALE 2 PUFFS INTO THE LUNGS EVERY 6 (SIX) HOURS AS NEEDED FOR WHEEZING OR SHORTNESS OF BREATH. 18 g 6   amLODipine (NORVASC) 10 MG tablet Take 1 tablet by mouth at bedtime 30 tablet 5   AURYXIA 1 GM 210 MG(Fe) tablet Take 210 mg by mouth 3 (three) times daily.     Blood Glucose Monitoring Suppl (ACCU-CHEK GUIDE) w/Device KIT Use as directed tid 1 kit 0   buPROPion (WELLBUTRIN XL) 150 MG 24 hr tablet Take 1 tablet (150 mg total) by mouth daily. (Patient not taking: Reported on 05/18/2021) 30 tablet 2   citalopram (CELEXA) 40 MG tablet Take 1 tablet (40 mg total) by mouth daily. 90 tablet 1   cloNIDine (CATAPRES) 0.2 MG tablet Take 1 tablet (0.2 mg total) by mouth 2 (two) times daily. Please keep 04-06-21 appointment for further refills. 2 nd attempt 180 tablet 3   ezetimibe (ZETIA) 10 MG tablet Take 1 tablet (10 mg total) by mouth daily. 90 tablet 3   fluticasone-salmeterol (ADVAIR) 250-50 MCG/ACT AEPB Inhale 1 puff into the lungs 2 (two) times daily. 60 each 6   furosemide (LASIX) 80 MG tablet Take 80 mg by mouth 2 (two) times daily. 3 days a  week on non HD days     glucose blood (ACCU-CHEK GUIDE) test strip Use as instructed 3 times daily 100 each 12   hydrOXYzine (ATARAX/VISTARIL) 25 MG tablet TAKE 1 TABLET (25 MG TOTAL) BY MOUTH 3 (THREE) TIMES DAILY AS NEEDED FOR ANXIETY. 90 tablet 3   levETIRAcetam (KEPPRA) 500 MG tablet Take 1 tablet (500 mg total) by mouth 2 (two) times daily. 180 tablet 1   lidocaine (LIDODERM) 5 % Place 1 patch onto the skin daily. Remove & Discard patch within 12 hours or as directed by MD (Patient not taking: Reported on 05/05/2021) 30 patch 3   lisinopril (ZESTRIL) 40 MG tablet TAKE 1 TABLET BY MOUTH DAILY 90 tablet 1   Misc. Devices MISC Blood pressure monitor.  Diagnosis-hypertension. 1 each 0   oxyCODONE-acetaminophen (PERCOCET) 10-325 MG tablet Take 1 tablet by mouth every 6 (six) hours as needed for pain. (Patient not taking: Reported on 05/05/2021) 20 tablet 0   Polyethyl Glycol-Propyl Glycol (SYSTANE OP) Place 1 drop into both eyes daily.     pregabalin (LYRICA) 25 MG capsule Take 1 capsule (25 mg total) by mouth at bedtime. 30 capsule 8   rosuvastatin (CRESTOR) 10 MG tablet Take 1 tablet (  10 mg total) by mouth daily. 90 tablet 3   sodium zirconium cyclosilicate (LOKELMA) 10 g PACK packet TAKE 1 PACKET BY MOUTH DAILY 30 each 3   No current facility-administered medications for this visit.     ROS:  See HPI  Physical Exam:  Today's Vitals   05/24/21 1004  BP: 122/77  Pulse: 62  Resp: 20  Temp: 98.2 F (36.8 C)  TempSrc: Temporal  SpO2: 97%  Weight: 217 lb 9.6 oz (98.7 kg)  Height: 6' (1.829 m)  PainSc: 3    Body mass index is 29.51 kg/m.   Incision:  both have healed nicely Extremities:   There is a palpable left radial pulse.   Motor and sensory are in tact.   There is not a thrill/bruit present.  The graft is thrombosed.    Vein mapping on 05/24/2021: +-----------------+-------------+----------+--------+  Right Cephalic   Diameter (cm)Depth (cm)Findings   +-----------------+-------------+----------+--------+  Shoulder             0.25        0.32             +-----------------+-------------+----------+--------+  Prox upper arm       0.27        0.35             +-----------------+-------------+----------+--------+  Mid upper arm        0.30        0.35             +-----------------+-------------+----------+--------+  Dist upper arm       0.33        0.42             +-----------------+-------------+----------+--------+  Antecubital fossa    0.24        0.27             +-----------------+-------------+----------+--------+  Prox forearm         0.23        0.25             +-----------------+-------------+----------+--------+  Mid forearm          0.24        0.46             +-----------------+-------------+----------+--------+  Dist forearm         0.25        0.39             +-----------------+-------------+----------+--------+  Wrist                0.19        0.26             +-----------------+-------------+----------+--------+   +-----------------+-------------+----------+---------+  Right Basilic    Diameter (cm)Depth (cm)Findings   +-----------------+-------------+----------+---------+  Mid upper arm        0.48        1.24              +-----------------+-------------+----------+---------+  Dist upper arm       0.50        0.98   branching  +-----------------+-------------+----------+---------+  Antecubital fossa    0.43        0.75              +-----------------+-------------+----------+---------+  Prox forearm         0.21        0.37              +-----------------+-------------+----------+---------+  Mid forearm  0.12        0.27   branching  +-----------------+-------------+----------+---------+  Distal forearm       0.10        0.27              +-----------------+-------------+----------+---------+  Wrist                 0.08        0.31              +-----------------+-------------+----------+---------+   +-----------------+-------------+----------+---------+  Left Cephalic    Diameter (cm)Depth (cm)Findings   +-----------------+-------------+----------+---------+  Shoulder             0.22        0.27              +-----------------+-------------+----------+---------+  Prox upper arm       0.20        0.22              +-----------------+-------------+----------+---------+  Mid upper arm        0.19        0.27              +-----------------+-------------+----------+---------+  Dist upper arm       0.22        0.63              +-----------------+-------------+----------+---------+  Antecubital fossa    0.23        0.58              +-----------------+-------------+----------+---------+  Prox forearm         0.21        0.30              +-----------------+-------------+----------+---------+  Mid forearm          0.29        0.39   branching  +-----------------+-------------+----------+---------+  Dist forearm         0.20        0.34              +-----------------+-------------+----------+---------+  Wrist                0.18        0.37   branching  +-----------------+-------------+----------+---------+   +-----------------+-------------+----------+--------------+  Left Basilic     Diameter (cm)Depth (cm)   Findings     +-----------------+-------------+----------+--------------+  Mid upper arm        0.33        1.39                   +-----------------+-------------+----------+--------------+  Dist upper arm       0.26        1.09                   +-----------------+-------------+----------+--------------+  Antecubital fossa    0.15        0.73                   +-----------------+-------------+----------+--------------+  Prox forearm         0.11        0.53                    +-----------------+-------------+----------+--------------+  Mid forearm                             not visualized  +-----------------+-------------+----------+--------------+  Distal forearm                          not visualized  +-----------------+-------------+----------+--------------+  Wrist                                   not visualized  +-----------------+-------------+----------+--------------+    Assessment/Plan:  This is a 60 y.o. male who is s/p: LUE AVG on 04/21/2021 by Dr. Stanford Breed.  He initially had a left radio-basilic AVF created on 08/30/6859 but this thrombosed.   He has [place] that was placed on 03/25/2021 by IR.   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   -given the pt has had 2 failed accesses over the past couple of months, will schedule him for a bilateral venogram as soon as it can be scheduled before proceeding with new access.  The pt is in agreement with this plan.   -he is right arm dominant especially in light of the numbness he has after neck surgery -pt states that his catheter has been acting up.  Says it has been draining clear fluid around it.  I cannot express any fluid.  Does not appear infected.   -discussed with pt that access does not last forever and will need intervention or even new access at some point.     Leontine Locket, [facility] [phone]  Clinic MD:  Trula Slade

## 2021-05-25 ENCOUNTER — Encounter (HOSPITAL_COMMUNITY): Payer: Medicare Other

## 2021-05-25 ENCOUNTER — Other Ambulatory Visit: Payer: Self-pay

## 2021-05-25 ENCOUNTER — Encounter: Payer: Medicare Other | Admitting: Vascular Surgery

## 2021-06-01 ENCOUNTER — Other Ambulatory Visit: Payer: Self-pay

## 2021-06-01 MED ORDER — CLONIDINE HCL 0.1 MG PO TABS
0.1000 mg | ORAL_TABLET | Freq: Two times a day (BID) | ORAL | 11 refills | Status: DC
  Filled 2021-06-01: qty 60, 30d supply, fill #0

## 2021-06-04 ENCOUNTER — Encounter (HOSPITAL_COMMUNITY): Payer: Self-pay | Admitting: Vascular Surgery

## 2021-06-04 ENCOUNTER — Ambulatory Visit (HOSPITAL_COMMUNITY)
Admission: RE | Admit: 2021-06-04 | Discharge: 2021-06-04 | Disposition: A | Discharge status: Home / Self Care | Payer: Medicare Other | Attending: Vascular Surgery | Admitting: Vascular Surgery

## 2021-06-04 ENCOUNTER — Encounter (HOSPITAL_COMMUNITY)
Admission: RE | Disposition: A | Discharge status: Home / Self Care | Payer: Self-pay | Source: Home / Self Care | Attending: Vascular Surgery

## 2021-06-04 ENCOUNTER — Other Ambulatory Visit: Payer: Self-pay

## 2021-06-04 DIAGNOSIS — Y841 Kidney dialysis as the cause of abnormal reaction of the patient, or of later complication, without mention of misadventure at the time of the procedure: Secondary | ICD-10-CM | POA: Insufficient documentation

## 2021-06-04 DIAGNOSIS — T82510A Breakdown (mechanical) of surgically created arteriovenous fistula, initial encounter: Secondary | ICD-10-CM | POA: Diagnosis present

## 2021-06-04 DIAGNOSIS — T82898A Other specified complication of vascular prosthetic devices, implants and grafts, initial encounter: Secondary | ICD-10-CM | POA: Diagnosis not present

## 2021-06-04 HISTORY — PX: UPPER EXTREMITY VENOGRAPHY: CATH118272

## 2021-06-04 LAB — POCT I-STAT, CHEM 8
BUN: 21 mg/dL (ref 8–23)
Calcium, Ion: 1.11 mmol/L — ABNORMAL LOW (ref 1.15–1.40)
Chloride: 92 mmol/L — ABNORMAL LOW (ref 98–111)
Creatinine, Ser: 6.9 mg/dL — ABNORMAL HIGH (ref 0.61–1.24)
Glucose, Bld: 135 mg/dL — ABNORMAL HIGH (ref 70–99)
HCT: 39 % (ref 39.0–52.0)
Hemoglobin: 13.3 g/dL (ref 13.0–17.0)
Potassium: 4.4 mmol/L (ref 3.5–5.1)
Sodium: 132 mmol/L — ABNORMAL LOW (ref 135–145)
TCO2: 29 mmol/L (ref 22–32)

## 2021-06-04 SURGERY — UPPER EXTREMITY VENOGRAPHY
Anesthesia: LOCAL | Laterality: Bilateral

## 2021-06-04 MED ORDER — SODIUM CHLORIDE 0.9% FLUSH: 3.0000 mL | INTRAVENOUS | Status: DC | PRN

## 2021-06-04 MED ORDER — SODIUM CHLORIDE 0.9 % IV SOLN: 250.0000 mL | INTRAVENOUS | Status: DC | PRN

## 2021-06-04 MED ORDER — SODIUM CHLORIDE 0.9% FLUSH: 3.0000 mL | Freq: Two times a day (BID) | INTRAVENOUS | Status: DC

## 2021-06-04 MED ORDER — IODIXANOL 320 MG/ML IV SOLN
INTRAVENOUS | Status: DC | PRN
  Administered 2021-06-04: 30 mL

## 2021-06-04 SURGICAL SUPPLY — 2 items
STOPCOCK MORSE 400PSI 3WAY (MISCELLANEOUS) ×2 IMPLANT
TUBING CIL FLEX 10 FLL-RA (TUBING) ×2 IMPLANT

## 2021-06-04 NOTE — Op Note (Signed)
DATE OF SERVICE: 06/04/2021  PATIENT:  [name] Slade Sr.  60 y.o. male  PRE-OPERATIVE DIAGNOSIS:  failed dialysis access in left arm   POST-OPERATIVE DIAGNOSIS:  Same  PROCEDURE:   1) right upper extremity venogram 2) left upper extremity venogram 3) central venogram  SURGEON:  Surgeon(s) and Role:    * [name], MD - Primary  ASSISTANT: none  ANESTHESIA:   none  EBL: none  BLOOD ADMINISTERED:none  DRAINS: none   LOCAL MEDICATIONS USED:  NONE  SPECIMEN:  none  COUNTS: confirmed correct.  TOURNIQUET:  none  PATIENT DISPOSITION:  PACU - hemodynamically stable.   Delay start of Pharmacological VTE agent (>24hrs) due to surgical blood loss or risk of bleeding: no  INDICATION FOR PROCEDURE: [name] Foerster Sr. is a 60 y.o. male with failed left arm access. After careful discussion of risks, benefits, and alternatives the patient was offered central and bilateral arm venography. The patient understood and wished to proceed.  OPERATIVE FINDINGS: axillary veins and central veins appear widely patent without evidence of flow limiting stenosis or occlusion  DESCRIPTION OF PROCEDURE: After identification of the patient in the pre-operative holding area, the patient was transferred to the operating room. The patient was positioned supine on the operating room table. Previously placed peripheral IV cannulas were  prepped and draped in standard fashion. A surgical pause was performed confirming correct patient, procedure, and operative location.  Contrast was injected in the IV cannulas and subtraction angiography performed. Results seen above.   Upon completion of the case instrument and sharps counts were confirmed correct. The patient was transferred to the PACU in good condition. I was present for all portions of the procedure.  Yevonne Aline. Stanford Breed, MD [facility] Phone Number: [phone] 06/04/2021 7:31 AM

## 2021-06-04 NOTE — Interval H&P Note (Signed)
History and Physical Interval Note:  06/04/2021 7:24 AM  [name] [name] Sr.  has presented today for surgery, with the diagnosis of instage reanl.  The various methods of treatment have been discussed with the patient and family. After consideration of risks, benefits and other options for treatment, the patient has consented to  Procedure(s): [place] (Bilateral) as a surgical intervention.  The patient's history has been reviewed, patient examined, no change in status, stable for surgery.  I have reviewed the patient's chart and labs.  Questions were answered to the patient's satisfaction.     [name]

## 2021-06-08 ENCOUNTER — Other Ambulatory Visit: Payer: Self-pay

## 2021-06-14 ENCOUNTER — Other Ambulatory Visit: Payer: Self-pay

## 2021-06-29 ENCOUNTER — Encounter (HOSPITAL_COMMUNITY): Payer: Self-pay | Admitting: Vascular Surgery

## 2021-06-29 ENCOUNTER — Other Ambulatory Visit: Payer: Self-pay

## 2021-06-29 NOTE — Progress Notes (Addendum)
PCP : Dr. Charlott Rakes Cardiologist : Dr. Lin Givens  [name] [name] denies chest pain or shortness of breath at rest. Patient denies having any s/s of Covid in his household.  Patient denies any known exposure to Covid.   [name] [name] has type II diabetes- diet controlled, patient does not check CBGs.  I instructed patient to shower with antibiotic soap, if it is available.  Dry off with a clean towel. Do not put lotion, powder, cologne or deodorant or makeup.No jewelry or piercings. Men may shave their face and neck. Woman should not shave. No nail polish, artificial or acrylic nails. Wear clean clothes, brush your teeth. Glasses, contact lens,dentures or partials may not be worn in the OR. If you need to wear them, please bring a case for glasses, do not wear contacts or bring a case, the hospital does not have contact cases, dentures or partials will have to be removed , make sure they are clean, we will provide a denture cup to put them in. You will need some one to drive you home and a responsible person over the age of 67 to stay with you for the first 24 hours after surgery.

## 2021-06-30 ENCOUNTER — Encounter (HOSPITAL_COMMUNITY)
Admission: RE | Disposition: A | Discharge status: Home / Self Care | Payer: Self-pay | Source: Home / Self Care | Attending: Vascular Surgery

## 2021-06-30 ENCOUNTER — Ambulatory Visit (HOSPITAL_COMMUNITY)
Admission: RE | Admit: 2021-06-30 | Discharge: 2021-06-30 | Disposition: A | Discharge status: Home / Self Care | Payer: Medicare Other | Attending: Vascular Surgery | Admitting: Vascular Surgery

## 2021-06-30 ENCOUNTER — Encounter (HOSPITAL_COMMUNITY): Payer: Self-pay | Admitting: Vascular Surgery

## 2021-06-30 ENCOUNTER — Ambulatory Visit (HOSPITAL_COMMUNITY): Payer: Medicare Other | Admitting: Certified Registered"

## 2021-06-30 ENCOUNTER — Other Ambulatory Visit: Payer: Self-pay

## 2021-06-30 DIAGNOSIS — Z992 Dependence on renal dialysis: Secondary | ICD-10-CM | POA: Insufficient documentation

## 2021-06-30 DIAGNOSIS — E1122 Type 2 diabetes mellitus with diabetic chronic kidney disease: Secondary | ICD-10-CM | POA: Insufficient documentation

## 2021-06-30 DIAGNOSIS — I509 Heart failure, unspecified: Secondary | ICD-10-CM | POA: Diagnosis not present

## 2021-06-30 DIAGNOSIS — X58XXXA Exposure to other specified factors, initial encounter: Secondary | ICD-10-CM | POA: Diagnosis not present

## 2021-06-30 DIAGNOSIS — D631 Anemia in chronic kidney disease: Secondary | ICD-10-CM | POA: Insufficient documentation

## 2021-06-30 DIAGNOSIS — T82818A Embolism of vascular prosthetic devices, implants and grafts, initial encounter: Secondary | ICD-10-CM

## 2021-06-30 DIAGNOSIS — N186 End stage renal disease: Secondary | ICD-10-CM | POA: Diagnosis present

## 2021-06-30 DIAGNOSIS — R200 Anesthesia of skin: Secondary | ICD-10-CM | POA: Diagnosis not present

## 2021-06-30 DIAGNOSIS — I132 Hypertensive heart and chronic kidney disease with heart failure and with stage 5 chronic kidney disease, or end stage renal disease: Secondary | ICD-10-CM | POA: Diagnosis not present

## 2021-06-30 DIAGNOSIS — T82868A Thrombosis of vascular prosthetic devices, implants and grafts, initial encounter: Secondary | ICD-10-CM

## 2021-06-30 DIAGNOSIS — T82898A Other specified complication of vascular prosthetic devices, implants and grafts, initial encounter: Secondary | ICD-10-CM

## 2021-06-30 HISTORY — PX: AV FISTULA PLACEMENT: SHX1204

## 2021-06-30 HISTORY — DX: End stage renal disease: N18.6

## 2021-06-30 HISTORY — DX: Tremor, unspecified: R25.1

## 2021-06-30 LAB — GLUCOSE, CAPILLARY
Glucose-Capillary: 135 mg/dL — ABNORMAL HIGH (ref 70–99)
Glucose-Capillary: 163 mg/dL — ABNORMAL HIGH (ref 70–99)

## 2021-06-30 LAB — POCT I-STAT, CHEM 8
BUN: 30 mg/dL — ABNORMAL HIGH (ref 8–23)
Calcium, Ion: 1.1 mmol/L — ABNORMAL LOW (ref 1.15–1.40)
Chloride: 102 mmol/L (ref 98–111)
Creatinine, Ser: 7.1 mg/dL — ABNORMAL HIGH (ref 0.61–1.24)
Glucose, Bld: 127 mg/dL — ABNORMAL HIGH (ref 70–99)
HCT: 39 % (ref 39.0–52.0)
Hemoglobin: 13.3 g/dL (ref 13.0–17.0)
Potassium: 4.9 mmol/L (ref 3.5–5.1)
Sodium: 137 mmol/L (ref 135–145)
TCO2: 26 mmol/L (ref 22–32)

## 2021-06-30 SURGERY — ARTERIOVENOUS (AV) FISTULA CREATION
Anesthesia: Monitor Anesthesia Care | Site: Arm Upper | Laterality: Right

## 2021-06-30 MED ORDER — PHENYLEPHRINE HCL-NACL 20-0.9 MG/250ML-% IV SOLN
INTRAVENOUS | Status: DC | PRN
  Administered 2021-06-30: 50 ug/min via INTRAVENOUS

## 2021-06-30 MED ORDER — MIDAZOLAM HCL 2 MG/2ML IJ SOLN
INTRAMUSCULAR | Status: DC | PRN
  Administered 2021-06-30: 2 mg via INTRAVENOUS

## 2021-06-30 MED ORDER — EPHEDRINE SULFATE-NACL 50-0.9 MG/10ML-% IV SOSY
PREFILLED_SYRINGE | INTRAVENOUS | Status: DC | PRN
  Administered 2021-06-30: 10 mg via INTRAVENOUS
  Administered 2021-06-30: 15 mg via INTRAVENOUS

## 2021-06-30 MED ORDER — OXYCODONE-ACETAMINOPHEN 10-325 MG PO TABS: 1.0000 | ORAL_TABLET | Freq: Four times a day (QID) | ORAL | 0 refills | Status: DC | PRN

## 2021-06-30 MED ORDER — ONDANSETRON HCL 4 MG/2ML IJ SOLN
INTRAMUSCULAR | Status: AC
  Filled 2021-06-30: qty 2

## 2021-06-30 MED ORDER — HEPARIN 6000 UNIT IRRIGATION SOLUTION
Status: AC
  Filled 2021-06-30: qty 500

## 2021-06-30 MED ORDER — CHLORHEXIDINE GLUCONATE 4 % EX LIQD: 60.0000 mL | Freq: Once | CUTANEOUS | Status: DC

## 2021-06-30 MED ORDER — SODIUM CHLORIDE 0.9 % IV SOLN: INTRAVENOUS | Status: DC

## 2021-06-30 MED ORDER — EPHEDRINE 5 MG/ML INJ
INTRAVENOUS | Status: AC
  Filled 2021-06-30: qty 5

## 2021-06-30 MED ORDER — CEFAZOLIN SODIUM-DEXTROSE 2-4 GM/100ML-% IV SOLN
2.0000 g | INTRAVENOUS | Status: AC
  Administered 2021-06-30: 2 g via INTRAVENOUS
  Filled 2021-06-30: qty 100

## 2021-06-30 MED ORDER — HEPARIN 6000 UNIT IRRIGATION SOLUTION
Status: DC | PRN
  Administered 2021-06-30: 1

## 2021-06-30 MED ORDER — CHLORHEXIDINE GLUCONATE 0.12 % MT SOLN
15.0000 mL | Freq: Once | OROMUCOSAL | Status: AC
  Administered 2021-06-30: 15 mL via OROMUCOSAL
  Filled 2021-06-30: qty 15

## 2021-06-30 MED ORDER — FENTANYL CITRATE (PF) 250 MCG/5ML IJ SOLN
INTRAMUSCULAR | Status: DC | PRN
  Administered 2021-06-30: 50 ug via INTRAVENOUS
  Administered 2021-06-30: 100 ug via INTRAVENOUS

## 2021-06-30 MED ORDER — MIDAZOLAM HCL 2 MG/2ML IJ SOLN
INTRAMUSCULAR | Status: AC
  Filled 2021-06-30: qty 2

## 2021-06-30 MED ORDER — PAPAVERINE HCL 30 MG/ML IJ SOLN
INTRAMUSCULAR | Status: AC
  Filled 2021-06-30: qty 2

## 2021-06-30 MED ORDER — FENTANYL CITRATE (PF) 250 MCG/5ML IJ SOLN
INTRAMUSCULAR | Status: AC
  Filled 2021-06-30: qty 5

## 2021-06-30 MED ORDER — ORAL CARE MOUTH RINSE: 15.0000 mL | Freq: Once | OROMUCOSAL | Status: AC

## 2021-06-30 MED ORDER — PROPOFOL 500 MG/50ML IV EMUL
INTRAVENOUS | Status: DC | PRN
  Administered 2021-06-30: 75 ug/kg/min via INTRAVENOUS

## 2021-06-30 MED ORDER — 0.9 % SODIUM CHLORIDE (POUR BTL) OPTIME
TOPICAL | Status: DC | PRN
  Administered 2021-06-30: 1000 mL

## 2021-06-30 MED ORDER — ONDANSETRON HCL 4 MG/2ML IJ SOLN
INTRAMUSCULAR | Status: DC | PRN
Start: 2021-06-30 — End: 2021-06-30
  Administered 2021-06-30: 4 mg via INTRAVENOUS

## 2021-06-30 SURGICAL SUPPLY — 36 items
ADH SKN CLS APL DERMABOND .7 (GAUZE/BANDAGES/DRESSINGS) ×1
APL PRP STRL LF DISP 70% ISPRP (MISCELLANEOUS) ×1
ARMBAND PINK RESTRICT EXTREMIT (MISCELLANEOUS) ×2 IMPLANT
CANISTER SUCT 3000ML PPV (MISCELLANEOUS) ×2 IMPLANT
CANNULA VESSEL 3MM 2 BLNT TIP (CANNULA) ×2 IMPLANT
CHLORAPREP W/TINT 26 (MISCELLANEOUS) ×2 IMPLANT
CLIP LIGATING EXTRA MED SLVR (CLIP) ×2 IMPLANT
CLIP LIGATING EXTRA SM BLUE (MISCELLANEOUS) ×2 IMPLANT
COVER PROBE W GEL 5X96 (DRAPES) ×1 IMPLANT
DERMABOND ADVANCED (GAUZE/BANDAGES/DRESSINGS) ×1
DERMABOND ADVANCED .7 DNX12 (GAUZE/BANDAGES/DRESSINGS) IMPLANT
ELECT REM PT RETURN 9FT ADLT (ELECTROSURGICAL) ×2
ELECTRODE REM PT RTRN 9FT ADLT (ELECTROSURGICAL) ×1 IMPLANT
GLOVE SURG POLYISO LF SZ8 (GLOVE) ×2 IMPLANT
GOWN STRL REUS W/ TWL LRG LVL3 (GOWN DISPOSABLE) ×2 IMPLANT
GOWN STRL REUS W/ TWL XL LVL3 (GOWN DISPOSABLE) ×1 IMPLANT
GOWN STRL REUS W/TWL LRG LVL3 (GOWN DISPOSABLE) ×4
GOWN STRL REUS W/TWL XL LVL3 (GOWN DISPOSABLE) ×2
INSERT FOGARTY SM (MISCELLANEOUS) IMPLANT
KIT BASIN OR (CUSTOM PROCEDURE TRAY) ×2 IMPLANT
KIT TURNOVER KIT B (KITS) ×2 IMPLANT
LOOP VESSEL MINI RED (MISCELLANEOUS) ×1 IMPLANT
NDL 18GX1X1/2 (RX/OR ONLY) (NEEDLE) IMPLANT
NEEDLE 18GX1X1/2 (RX/OR ONLY) (NEEDLE) IMPLANT
NS IRRIG 1000ML POUR BTL (IV SOLUTION) ×2 IMPLANT
PACK CV ACCESS (CUSTOM PROCEDURE TRAY) ×2 IMPLANT
PAD ARMBOARD 7.5X6 YLW CONV (MISCELLANEOUS) ×4 IMPLANT
SUT MNCRL AB 4-0 PS2 18 (SUTURE) ×2 IMPLANT
SUT PROLENE 6 0 BV (SUTURE) ×2 IMPLANT
SUT VIC AB 3-0 SH 27 (SUTURE) ×2
SUT VIC AB 3-0 SH 27X BRD (SUTURE) ×1 IMPLANT
SYR 3ML LL SCALE MARK (SYRINGE) IMPLANT
SYR BULB IRRIG 60ML STRL (SYRINGE) ×1 IMPLANT
TOWEL GREEN STERILE (TOWEL DISPOSABLE) ×2 IMPLANT
UNDERPAD 30X36 HEAVY ABSORB (UNDERPADS AND DIAPERS) ×2 IMPLANT
WATER STERILE IRR 1000ML POUR (IV SOLUTION) ×2 IMPLANT

## 2021-06-30 NOTE — Op Note (Signed)
DATE OF SERVICE: 06/30/2021  PATIENT:  [name] Slade Sr.  61 y.o. male  PRE-OPERATIVE DIAGNOSIS:  ESRD  POST-OPERATIVE DIAGNOSIS:  Same  PROCEDURE:   Right ulnar-basilic first stage arteriovenous fistula  SURGEON:  Surgeon(s) and Role:    * [name], MD - Primary  ASSISTANT: Gerri Lins, PA-C  An assistant was required to facilitate exposure and expedite the case.  ANESTHESIA:   regional and MAC  EBL: minimal  BLOOD ADMINISTERED:none  DRAINS: none   LOCAL MEDICATIONS USED:  NONE  SPECIMEN:  None  COUNTS: confirmed correct.  TOURNIQUET:  none  PATIENT DISPOSITION:  PACU - hemodynamically stable.   Delay start of Pharmacological VTE agent (>24hrs) due to surgical blood loss or risk of bleeding: no  INDICATION FOR PROCEDURE: [name] Colborn Sr. is a 61 y.o. male with ESRD. He has failed two left arm access attempts at permanent dialysis access. After careful discussion of risks, benefits, and alternatives the patient was offered right basilic vein fistula. The patient understood and wished to proceed.  OPERATIVE FINDINGS: small ulnar artery, small basilic vein after exposure. Both appeared larger at first look, making spasm likely. Vein dilated well. Good doppler flow in fistula at completion. Palpable radial pulse; ulnar doppler flow at completion.   DESCRIPTION OF PROCEDURE: After identification of the patient in the pre-operative holding area, the patient was transferred to the operating room. The patient was positioned supine on the operating room table. Anesthesia was induced. The right arm was prepped and draped in standard fashion. A surgical pause was performed confirming correct patient, procedure, and operative location.  Using intraoperative ultrasound the arteries and basilic vein were mapped. He has a high bifurcation of brachial artery was noted. An incision was planned over the course of the two vessels to allow fistula creation.  Incision was created.   Incision was carried down through subcutaneous tissue.  The aponeurosis of the biceps tendon was divided.  The brachial sheath was identified.  The brachial artery was skeletonized.  The artery was encircled with 2 Silastic Vesseloops.  Next attention was turned to the basilic vein.  This was identified in the medial arm in its typical position.  The vein was mobilized throughout the length of the incision to allow tension-free arteriovenous fistula creation.  The distal end of the vein was clamped with a right angle.  The proximal end of the vein was clamped with a bulldog.  The vein was transected distally.  The stump was oversewn with a 2-0 silk.  The cut end of the vein was spatulated and distended with a mosquito clamp.  Patient was systemically heparinized with 3000 units of IV heparin.  After a three minute pause, the brachial artery was clamped proximally distally.  The basilic vein was anastomosed to the brachial artery into side using continuous running suture of 6-0 Prolene.  Immediately prior to completion the anastomosis was flushed and de-aired.  The anastomosis was completed.  Clamps were released.  Hemostasis was achieved.  An audible bruit was heard in the fistula.  Palpable pulse radial was felt in the right wrist.  Hemostasis was achieved in the surgical bed.  The wound was closed with 3-0 Vicryl and 4-0 Monocryl.  Upon completion of the case instrument and sharps counts were confirmed correct. The patient was transferred to the PACU in good condition. I was present for all portions of the procedure.  Yevonne Aline. Stanford Breed, MD [facility] Phone Number: [phone] 06/30/2021  9:47 AM

## 2021-06-30 NOTE — Discharge Instructions (Signed)
° °  [facility] [place] ° °Discharge Instructions ° °AV Fistula or Graft Surgery for Dialysis Access ° °Please refer to the following instructions for your post-procedure care. Your surgeon or physician assistant will discuss any changes with you. ° °Activity ° °You may drive the day following your surgery, if you are comfortable and no longer taking prescription pain medication. Resume full activity as the soreness in your incision resolves. ° °Bathing/Showering ° °You may shower after you go home. Keep your incision dry for 48 hours. Do not soak in a bathtub, hot tub, or swim until the incision heals completely. You may not shower if you have a hemodialysis catheter. ° °Incision Care ° °Clean your incision with mild soap and water after 48 hours. Pat the area dry with a clean towel. You do not need a bandage unless otherwise instructed. Do not apply any ointments or creams to your incision. You may have skin glue on your incision. Do not peel it off. It will come off on its own in about one week. Your arm may swell a bit after surgery. To reduce swelling use pillows to elevate your arm so it is above your heart. Your doctor will tell you if you need to lightly wrap your arm with an ACE bandage. ° °Diet ° °Resume your normal diet. There are not special food restrictions following this procedure. In order to heal from your surgery, it is CRITICAL to get adequate nutrition. Your body requires vitamins, minerals, and protein. Vegetables are the best source of vitamins and minerals. Vegetables also provide the perfect balance of protein. Processed food has little nutritional value, so try to avoid this. ° °Medications ° °Resume taking all of your medications. If your incision is causing pain, you may take over-the counter pain relievers such as acetaminophen (Tylenol). If you were prescribed a stronger pain medication, please be aware these medications can cause nausea and constipation. Prevent  nausea by taking the medication with a snack or meal. Avoid constipation by drinking plenty of fluids and eating foods with high amount of fiber, such as fruits, vegetables, and grains. Do not take Tylenol if you are taking prescription pain medications. ° ° ° ° °Follow up °Your surgeon may want to see you in the office following your access surgery. If so, this will be arranged at the time of your surgery. ° °Please call us immediately for any of the following conditions: ° °Increased pain, redness, drainage (pus) from your incision site °Fever of 101 degrees or higher °Severe or worsening pain at your incision site °Hand pain or numbness. ° °Reduce your risk of vascular disease: ° °Stop smoking. If you would like help, call [facility] at 1-800-QUIT-NOW (1-800-784-8669) or [place] at 336-586-4000 ° °Manage your cholesterol °Maintain a desired weight °Control your diabetes °Keep your blood pressure down ° °Dialysis ° °It will take several weeks to several months for your new dialysis access to be ready for use. Your surgeon will determine when it is OK to use it. Your nephrologist will continue to direct your dialysis. You can continue to use your Permcath until your new access is ready for use. ° °If you have any questions, please call the office at [phone]. ° °

## 2021-06-30 NOTE — Transfer of Care (Signed)
Immediate Anesthesia Transfer of Care Note  Patient: [name] Slade Sr.  Procedure(s) Performed: RIGHT ARM ARTERIOVENOUS (AV) FISTULA (Right: Arm Upper)  Patient Location: PACU  Anesthesia Type:MAC and Regional  Level of Consciousness: awake, drowsy, patient cooperative and responds to stimulation  Airway & Oxygen Therapy: Patient Spontanous Breathing and Patient connected to nasal cannula oxygen  Post-op Assessment: Report given to RN and Post -op Vital signs reviewed and stable  Post vital signs: Reviewed and stable  Last Vitals:  Vitals Value Taken Time  BP 118/68 06/30/21 0952  Temp    Pulse 83 06/30/21 0952  Resp 38 06/30/21 0952  SpO2 97 % 06/30/21 0952  Vitals shown include unvalidated device data.  Last Pain:  Vitals:   06/30/21 0718  TempSrc:   PainSc: 0-No pain         Complications: No notable events documented.

## 2021-06-30 NOTE — H&P (Signed)
POST OPERATIVE OFFICE NOTE    CC:  F/u for surgery  HPI:  This is a 61 y.o. male who is s/p LUE AVG on 04/21/2021 by Dr. Stanford Breed.  He initially had a left radio-basilic AVF created on 08/25/4968 but this thrombosed.   He has right TDC that was placed on 03/25/2021 by IR.   Pt states he does  have numbness in the left arm and hand but has had this since his neck surgery.  This has not worsened.  He states that after surgery, he could feel the buzz in the graft but has not been able to feel it for about 2 weeks.  He was scheduled today for vein mapping as his LUA AVG has clotted.    The pt is on dialysis T/T/S at [place] location.   No Known Allergies  Current Facility-Administered Medications  Medication Dose Route Frequency Provider Last Rate Last Admin   0.9 %  sodium chloride infusion   Intravenous Continuous [name], MD 10 mL/hr at 06/30/21 0720 New Bag at 06/30/21 0720   0.9 %  sodium chloride infusion   Intravenous Continuous Suzette Battiest, MD       ceFAZolin (ANCEF) IVPB 2g/100 mL premix  2 g Intravenous 30 min Pre-Op [name], MD       chlorhexidine (HIBICLENS) 4 % liquid 4 application  60 mL Topical Once [name], MD       And   [START ON 07/01/2021] chlorhexidine (HIBICLENS) 4 % liquid 4 application  60 mL Topical Once [name], MD       midazolam (VERSED) 2 MG/2ML injection              ROS:  See HPI  Physical Exam:  Today's Vitals   06/29/21 0934 06/30/21 0702 06/30/21 0718  BP:  117/63   Pulse:  66   Resp:  18   Temp:  98.2 F (36.8 C)   TempSrc:  Oral   SpO2:  97%   Weight: 99.8 kg 98.7 kg   Height: 6' (1.829 m) 6' (1.829 m)   PainSc:   0-No pain   Body mass index is 29.51 kg/m.   Incision:  both have healed nicely Extremities:   There is a palpable left radial pulse.   Motor and sensory are in tact.   There is not a thrill/bruit present.  The graft is thrombosed.    Vein mapping on  06/30/2021: +-----------------+-------------+----------+--------+   Right Cephalic    Diameter (cm) Depth (cm) Findings   +-----------------+-------------+----------+--------+   Shoulder              0.25         0.32               +-----------------+-------------+----------+--------+   Prox upper arm        0.27         0.35               +-----------------+-------------+----------+--------+   Mid upper arm         0.30         0.35               +-----------------+-------------+----------+--------+   Dist upper arm        0.33         0.42               +-----------------+-------------+----------+--------+   Antecubital fossa  0.24         0.27               +-----------------+-------------+----------+--------+   Prox forearm          0.23         0.25               +-----------------+-------------+----------+--------+   Mid forearm           0.24         0.46               +-----------------+-------------+----------+--------+   Dist forearm          0.25         0.39               +-----------------+-------------+----------+--------+   Wrist                 0.19         0.26               +-----------------+-------------+----------+--------+   +-----------------+-------------+----------+---------+   Right Basilic     Diameter (cm) Depth (cm) Findings    +-----------------+-------------+----------+---------+   Mid upper arm         0.48         1.24                +-----------------+-------------+----------+---------+   Dist upper arm        0.50         0.98    branching   +-----------------+-------------+----------+---------+   Antecubital fossa     0.43         0.75                +-----------------+-------------+----------+---------+   Prox forearm          0.21         0.37                +-----------------+-------------+----------+---------+   Mid forearm           0.12         0.27    branching   +-----------------+-------------+----------+---------+   Distal forearm         0.10         0.27                +-----------------+-------------+----------+---------+   Wrist                 0.08         0.31                +-----------------+-------------+----------+---------+   +-----------------+-------------+----------+---------+   Left Cephalic     Diameter (cm) Depth (cm) Findings    +-----------------+-------------+----------+---------+   Shoulder              0.22         0.27                +-----------------+-------------+----------+---------+   Prox upper arm        0.20         0.22                +-----------------+-------------+----------+---------+   Mid upper arm         0.19         0.27                +-----------------+-------------+----------+---------+  Dist upper arm        0.22         0.63                +-----------------+-------------+----------+---------+   Antecubital fossa     0.23         0.58                +-----------------+-------------+----------+---------+   Prox forearm          0.21         0.30                +-----------------+-------------+----------+---------+   Mid forearm           0.29         0.39    branching   +-----------------+-------------+----------+---------+   Dist forearm          0.20         0.34                +-----------------+-------------+----------+---------+   Wrist                 0.18         0.37    branching   +-----------------+-------------+----------+---------+   +-----------------+-------------+----------+--------------+   Left Basilic      Diameter (cm) Depth (cm)    Findings      +-----------------+-------------+----------+--------------+   Mid upper arm         0.33         1.39                     +-----------------+-------------+----------+--------------+   Dist upper arm        0.26         1.09                     +-----------------+-------------+----------+--------------+   Antecubital fossa     0.15         0.73                      +-----------------+-------------+----------+--------------+   Prox forearm          0.11         0.53                     +-----------------+-------------+----------+--------------+   Mid forearm                                not visualized   +-----------------+-------------+----------+--------------+   Distal forearm                             not visualized   +-----------------+-------------+----------+--------------+   Wrist                                      not visualized   +-----------------+-------------+----------+--------------+    Assessment/Plan:  This is a 61 y.o. male who is s/p: LUE AVG on 04/21/2021 by Dr. Stanford Breed.  He initially had a left radio-basilic AVF created on 4/0/9735 but this thrombosed.   He has TDC that was placed on 03/25/2021 by IR.   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   -  given the pt has had 2 failed accesses over the past couple of months, will schedule him for a bilateral venogram as soon as it can be scheduled before proceeding with new access.  The pt is in agreement with this plan.   -he is right arm dominant especially in light of the numbness he has after neck surgery -pt states that his catheter has been acting up.  Says it has been draining clear fluid around it.  I cannot express any fluid.  Does not appear infected.   -discussed with pt that access does not last forever and will need intervention or even new access at some point.     Leontine Locket, [facility] [phone]  Clinic MD:  Trula Slade

## 2021-06-30 NOTE — Anesthesia Preprocedure Evaluation (Signed)
Anesthesia Evaluation  Patient identified by MRN, date of birth, ID band Patient awake    Reviewed: Allergy & Precautions, NPO status , Patient's Chart, lab work & pertinent test results  Airway Mallampati: III  TM Distance: >3 FB Neck ROM: Full    Dental  (+) Dental Advisory Given, Edentulous Lower, Edentulous Upper   Pulmonary COPD,  COPD inhaler, Current Smoker and Patient abstained from smoking.,    breath sounds clear to auscultation       Cardiovascular hypertension, Pt. on home beta blockers and Pt. on medications + CAD and +CHF   Rhythm:Regular  2019 ? Nuclear stress EF: 50%. ? There was no ST segment deviation noted during stress. ? The study is normal. ? This is a low risk study. ? The left ventricular ejection fraction is mildly decreased (45-54%).   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 Systolic function was  normal. The estimated ejection fraction was in the range of 55%  to 60%. Wall motion was normal; there were no regional wall  motion abnormalities. The study is not technically sufficient to  allow evaluation of LV diastolic function.  - Aortic valve: Transvalvular velocity was within the normal range.  There was no stenosis. There was no regurgitation.  - Mitral valve: Transvalvular velocity was within the normal range.  There was no evidence for stenosis. There was trivial  regurgitation.  - Right ventricle: The cavity size was normal. Wall thickness was  normal. Systolic function was normal.  - Tricuspid valve: There was trivial regurgitation.  - Pulmonary arteries: Systolic pressure was within the normal  range. PA peak pressure: 34 mm Hg (S).    Neuro/Psych Seizures -, Well Controlled,  PSYCHIATRIC  DISORDERS Anxiety Depression    GI/Hepatic negative GI ROS,   Endo/Other  diabetesLab Results      Component                Value               Date                      HGBA1C                   6.6                 05/18/2021             Renal/GU CRFRenal disease     Musculoskeletal  (+) Arthritis ,   Abdominal   Peds  Hematology  (+) Blood dyscrasia, anemia , Lab Results      Component                Value               Date                      WBC                      11.8 (H)            03/25/2021                HGB  13.3                06/30/2021                HCT                      39.0                06/30/2021                MCV                      92.1                03/25/2021                PLT                      354                 03/25/2021              Anesthesia Other Findings   Reproductive/Obstetrics                             Anesthesia Physical Anesthesia Plan  ASA: 3  Anesthesia Plan: MAC and Regional   Post-op Pain Management: Regional block   Induction: Intravenous  PONV Risk Score and Plan: Treatment may vary due to age or medical condition and Propofol infusion  Airway Management Planned: Nasal Cannula  Additional Equipment: None  Intra-op Plan:   Post-operative Plan:   Informed Consent: I have reviewed the patients History and Physical, chart, labs and discussed the procedure including the risks, benefits and alternatives for the proposed anesthesia with the patient or authorized representative who has indicated his/her understanding and acceptance.     Dental advisory given  Plan Discussed with: CRNA and Anesthesiologist  Anesthesia Plan Comments:         Anesthesia Quick Evaluation

## 2021-06-30 NOTE — Anesthesia Procedure Notes (Signed)
Anesthesia Regional Block: Supraclavicular block   Pre-Anesthetic Checklist: , timeout performed,  Correct Patient, Correct Site, Correct Laterality,  Correct Procedure, Correct Position, site marked,  Risks and benefits discussed,  Surgical consent,  Pre-op evaluation,  At surgeon's request and post-op pain management  Laterality: Right and Upper  Prep: chloraprep       Needles:  Injection technique: Single-shot      Needle Length: 5cm  Needle Gauge: 21     Additional Needles: Arrow StimuQuik ECHO Echogenic Stimulating PNB Needle  Procedures:,,,, ultrasound used (permanent image in chart),,    Narrative:  Start time: 06/30/2021 8:28 AM End time: 06/30/2021 8:36 AM Injection made incrementally with aspirations every 5 mL.  Performed by: Personally  Anesthesiologist: Oleta Mouse, MD

## 2021-07-01 ENCOUNTER — Encounter (HOSPITAL_COMMUNITY): Payer: Self-pay | Admitting: Vascular Surgery

## 2021-07-01 NOTE — Anesthesia Postprocedure Evaluation (Signed)
Anesthesia Post Note  Patient: [name] Pollio Sr.  Procedure(s) Performed: RIGHT ARM ARTERIOVENOUS (AV) FISTULA (Right: Arm Upper)     Patient location during evaluation: PACU Anesthesia Type: Regional and MAC Level of consciousness: awake and alert Pain management: pain level controlled Vital Signs Assessment: post-procedure vital signs reviewed and stable Respiratory status: spontaneous breathing, nonlabored ventilation, respiratory function stable and patient connected to nasal cannula oxygen Cardiovascular status: stable and blood pressure returned to baseline Postop Assessment: no apparent nausea or vomiting Anesthetic complications: no   No notable events documented.  Last Vitals:  Vitals:   06/30/21 1005 06/30/21 1025  BP: 110/64 119/66  Pulse: 75 76  Resp: 16 20  Temp:  36.5 C  SpO2: 95% 96%    Last Pain:  Vitals:   06/30/21 1025  TempSrc:   PainSc: 0-No pain                 Dorota Heinrichs

## 2021-07-12 ENCOUNTER — Other Ambulatory Visit: Payer: Self-pay

## 2021-07-12 ENCOUNTER — Other Ambulatory Visit (HOSPITAL_BASED_OUTPATIENT_CLINIC_OR_DEPARTMENT_OTHER): Payer: Self-pay

## 2021-07-12 MED ORDER — PREGABALIN 25 MG PO CAPS
25.0000 mg | ORAL_CAPSULE | Freq: Every day | ORAL | 1 refills | Status: DC
  Filled 2021-07-12: qty 30, 30d supply, fill #0
  Filled 2021-10-01: qty 30, 30d supply, fill #1

## 2021-07-13 ENCOUNTER — Other Ambulatory Visit (HOSPITAL_BASED_OUTPATIENT_CLINIC_OR_DEPARTMENT_OTHER): Payer: Self-pay

## 2021-07-13 MED ORDER — AMLODIPINE BESYLATE 10 MG PO TABS
10.0000 mg | ORAL_TABLET | Freq: Every day | ORAL | 3 refills | Status: DC
  Filled 2021-07-13: qty 90, 90d supply, fill #0

## 2021-07-26 ENCOUNTER — Other Ambulatory Visit (HOSPITAL_BASED_OUTPATIENT_CLINIC_OR_DEPARTMENT_OTHER): Payer: Self-pay

## 2021-07-28 HISTORY — DX: Disorder of phosphorus metabolism, unspecified: E83.30

## 2021-08-02 ENCOUNTER — Other Ambulatory Visit: Payer: Self-pay

## 2021-08-02 DIAGNOSIS — N186 End stage renal disease: Secondary | ICD-10-CM

## 2021-08-03 ENCOUNTER — Ambulatory Visit (HOSPITAL_COMMUNITY)
Admission: RE | Admit: 2021-08-03 | Discharge: 2021-08-03 | Disposition: A | Discharge status: Home / Self Care | Payer: Medicare Other | Source: Ambulatory Visit | Attending: Vascular Surgery | Admitting: Vascular Surgery

## 2021-08-03 ENCOUNTER — Other Ambulatory Visit: Payer: Self-pay

## 2021-08-03 ENCOUNTER — Ambulatory Visit (INDEPENDENT_AMBULATORY_CARE_PROVIDER_SITE_OTHER): Payer: Medicare Other | Admitting: Physician Assistant

## 2021-08-03 ENCOUNTER — Encounter: Payer: Self-pay | Admitting: Physician Assistant

## 2021-08-03 VITALS — BP 151/81 | HR 70 | Temp 97.9°F | Resp 18 | Ht 72.5 in | Wt 224.9 lb

## 2021-08-03 DIAGNOSIS — Z992 Dependence on renal dialysis: Secondary | ICD-10-CM | POA: Insufficient documentation

## 2021-08-03 DIAGNOSIS — N186 End stage renal disease: Secondary | ICD-10-CM | POA: Diagnosis present

## 2021-08-03 NOTE — H&P (View-Only) (Signed)
Postoperative Access Visit   History of Present Illness   Cutberto Winfree Sr. is a 61 y.o. year old male who presents for postoperative follow-up for: Right ulnar-basilic first stage arteriovenous fistula By Dr. Stanford Breed on 06/30/21. The patient's wounds are well healed.  The patient notes no steal symptoms.  The patient is able to complete their activities of daily living.  He currently dialyzes via right IJ TDC on Tues/Thurs/Sat on at the [facility] location  Has history of failed Left upper extremity AV Graft  Physical Examination   Vitals:   08/03/21 1029  BP: (!) 151/81  Pulse: 70  Resp: 18  Temp: 97.9 F (36.6 C)  TempSrc: Temporal  SpO2: 98%  Weight: 224 lb 14.4 oz (102 kg)  Height: 6' 0.5" (1.842 m)   Body mass index is 30.08 kg/m.  right arm Incision is well healed, 2+ radial pulse, hand grip is 5/5, sensation in digits is intact, palpable thrill, bruit can be auscultated. Fistula is deep in the right upper arm   Non invasive vascular lab studies: Findings:  +--------------------+----------+-----------------+--------+   AVF                  PSV (cm/s) Flow Vol (mL/min) Comments   +--------------------+----------+-----------------+--------+   Native artery inflow    172            578                   +--------------------+----------+-----------------+--------+   AVF Anastomosis         586                                  +--------------------+----------+-----------------+--------+   +------------+----------+-------------+----------+----------------+   OUTFLOW VEIN PSV (cm/s) Diameter (cm) Depth (cm)     Describe       +------------+----------+-------------+----------+----------------+   Shoulder        243         0.71         1.45                       +------------+----------+-------------+----------+----------------+   Prox UA         295         0.71         1.21    competing branch   +------------+----------+-------------+----------+----------------+    Mid UA          273         0.64         1.05    competing branch   +------------+----------+-------------+----------+----------------+   Dist UA         435         0.66         0.91                       +------------+----------+-------------+----------+----------------+   AC Fossa        599         0.37         0.62                       +------------+----------+-------------+----------+----------------+   Summary:  Patent right arteriovenous fistula.    Medical Decision Making   Cruise Baumgardner Sr. is a 60 y.o. year old male who presents s/p Right ulnar-basilic first stage  arteriovenous fistula By Dr. Stanford Breed on 06/30/21.  Patent is without signs or symptoms of steal syndrome. Incision is well healed. Duplex today shows fistula has matured nicely but is very deep in the right upper arm. Plan will be for right second stage BV transposition with Dr. Stanford Breed in the near future. He dialyzes on Tues/ Thurs/ Sat so will try to arrange this on non dialysis day   Karoline Caldwell, PA-C [facility] [place] Office: [facility] MD: Roxanne Mins

## 2021-08-03 NOTE — Progress Notes (Signed)
Postoperative Access Visit   History of Present Illness   [name] Cadman Sr. is a 61 y.o. year old male who presents for postoperative follow-up for: Right ulnar-basilic first stage arteriovenous fistula By Dr. Stanford Breed on 06/30/21. The patient's wounds are well healed.  The patient notes no steal symptoms.  The patient is able to complete their activities of daily living.  He currently dialyzes via right IJ TDC on Tues/Thurs/Sat on at the [facility] location  Has history of failed Left upper extremity AV Graft  Physical Examination   Vitals:   08/03/21 1029  BP: (!) 151/81  Pulse: 70  Resp: 18  Temp: 97.9 F (36.6 C)  TempSrc: Temporal  SpO2: 98%  Weight: 224 lb 14.4 oz (102 kg)  Height: 6' 0.5" (1.842 m)   Body mass index is 30.08 kg/m.  right arm Incision is well healed, 2+ radial pulse, hand grip is 5/5, sensation in digits is intact, palpable thrill, bruit can be auscultated. Fistula is deep in the right upper arm   Non invasive vascular lab studies: Findings:  +--------------------+----------+-----------------+--------+   AVF                  PSV (cm/s) Flow Vol (mL/min) Comments   +--------------------+----------+-----------------+--------+   Native artery inflow    172            578                   +--------------------+----------+-----------------+--------+   AVF Anastomosis         586                                  +--------------------+----------+-----------------+--------+   +------------+----------+-------------+----------+----------------+   OUTFLOW VEIN PSV (cm/s) Diameter (cm) Depth (cm)     Describe       +------------+----------+-------------+----------+----------------+   Shoulder        243         0.71         1.45                       +------------+----------+-------------+----------+----------------+   Prox UA         295         0.71         1.21    competing branch   +------------+----------+-------------+----------+----------------+    Mid UA          273         0.64         1.05    competing branch   +------------+----------+-------------+----------+----------------+   Dist UA         435         0.66         0.91                       +------------+----------+-------------+----------+----------------+   AC Fossa        599         0.37         0.62                       +------------+----------+-------------+----------+----------------+   Summary:  Patent right arteriovenous fistula.    Medical Decision Making   [name] Ingalsbe Sr. is a 61 y.o. year old male who presents s/p Right ulnar-basilic first stage  arteriovenous fistula By Dr. Stanford Breed on 06/30/21.  Patent is without signs or symptoms of steal syndrome. Incision is well healed. Duplex today shows fistula has matured nicely but is very deep in the right upper arm. Plan will be for right second stage BV transposition with Dr. Stanford Breed in the near future. He dialyzes on Tues/ Thurs/ Sat so will try to arrange this on non dialysis day   Karoline Caldwell, PA-C [facility] [place] Office: [facility] MD: Roxanne Mins

## 2021-08-12 ENCOUNTER — Other Ambulatory Visit: Payer: Self-pay

## 2021-08-12 ENCOUNTER — Encounter (HOSPITAL_COMMUNITY): Payer: Self-pay | Admitting: Vascular Surgery

## 2021-08-12 NOTE — Progress Notes (Signed)
PCP - Dr. Margarita Rana Cardiologist - denies EKG - 05/05/21 Chest x-ray -  ECHO - 10/24/17 Cardiac Cath -  CPAP -  Fasting Blood Sugar:   Checks Blood Sugar:  per pt MD ordered for CBG meter for pt to check CBG at home but insurance would not cover it so pt does not check CBG at home and tries to diet control his diabetes.  Blood Thinner Instructions:  Aspirin Instructions:  ERAS Protcol - n/a COVID TEST- n/a  Anesthesia review: yes  -------------  SDW INSTRUCTIONS:  Your procedure is scheduled on 2/20 Monday. Please report to [facility] Main Entrance "A" at 05:30 A.M., and check in at the Admitting office. Call this number if you have problems the morning of surgery: [phone]   Remember: Do not eat or drink after midnight the night before your surgery   Medications to take morning of surgery with a sip of water include: Celexa Zetia Keppra (Pt not taking Clonidine) Crestor  As of today, STOP taking any Aspirin (unless otherwise instructed by your surgeon), Aleve, Naproxen, Ibuprofen, Motrin, Advil, Goody's, BC's, all herbal medications, fish oil, and all vitamins.    The Morning of Surgery Do not wear jewelry Do not wear lotions, powders, or perfumes/colognes, or deodorant Do not bring valuables to the hospital. [facility] is not responsible for any belongings or valuables.  If you are a smoker, DO NOT Smoke 24 hours prior to surgery  If you wear a CPAP at night please bring your mask the morning of surgery   Remember that you must have someone to transport you home after your surgery, and remain with you for 24 hours if you are discharged the same day.  Please bring cases for contacts, glasses, hearing aids, dentures or bridgework because it cannot be worn into surgery.   Patients discharged the day of surgery will not be allowed to drive home.   Please shower the NIGHT BEFORE/MORNING OF SURGERY (use antibacterial soap like DIAL soap if possible). Wear comfortable  clothes the morning of surgery. Oral Hygiene is also important to reduce your risk of infection.  Remember - BRUSH YOUR TEETH THE MORNING OF SURGERY WITH YOUR REGULAR TOOTHPASTE  Patient denies shortness of breath, fever, cough and chest pain.

## 2021-08-13 NOTE — Anesthesia Preprocedure Evaluation (Addendum)
Anesthesia Evaluation  Patient identified by MRN, date of birth, ID band Patient awake    Reviewed: Allergy & Precautions, H&P , NPO status , Patient's Chart, lab work & pertinent test results  Airway Mallampati: II  TM Distance: >3 FB Neck ROM: Full    Dental no notable dental hx. (+) Edentulous Upper, Edentulous Lower   Pulmonary neg pulmonary ROS, Current Smoker,    Pulmonary exam normal breath sounds clear to auscultation       Cardiovascular Exercise Tolerance: Good hypertension, + CAD  Normal cardiovascular exam Rhythm:Regular Rate:Normal     Neuro/Psych negative neurological ROS  negative psych ROS   GI/Hepatic negative GI ROS, Neg liver ROS,   Endo/Other  negative endocrine ROSdiabetes  Renal/GU negative Renal ROS  negative genitourinary   Musculoskeletal negative musculoskeletal ROS (+)   Abdominal   Peds negative pediatric ROS (+)  Hematology negative hematology ROS (+)   Anesthesia Other Findings   Reproductive/Obstetrics negative OB ROS                           Anesthesia Physical Anesthesia Plan  ASA: 3  Anesthesia Plan: MAC and Regional   Post-op Pain Management:    Induction: Intravenous  PONV Risk Score and Plan: 1 and Treatment may vary due to age or medical condition and Propofol infusion  Airway Management Planned: Natural Airway, Nasal Cannula and Simple Face Mask  Additional Equipment:   Intra-op Plan:   Post-operative Plan:   Informed Consent: I have reviewed the patients History and Physical, chart, labs and discussed the procedure including the risks, benefits and alternatives for the proposed anesthesia with the patient or authorized representative who has indicated his/her understanding and acceptance.       Plan Discussed with: CRNA and Anesthesiologist  Anesthesia Plan Comments: (See APP note by Durel Salts, FNP   DISCUSSION: Pt is 61 years  old with hx CAD (calcifications on CT), CHF, heart murmur ("no murmur" documented by cardiologist Shirlee More on 05/05/21), HTN, DM, COPD, ESRD on hemodialysis  EKG 05/05/21: sinus bradycardia with 1st degree AV block.    CV: Cardiac event monitor 05/19/21:  - There was 1 symptomatic event with sinus rhythm. - Ventricular ectopy was rare, supraventricular ectopy was rare. - There are no pauses of 3 seconds or greater and no episodes of second or third-degree AV nodal block or sinus node exit block. - Minimum rate 45 bpm with sinus bradycardia occurring nocturnally.   Nuclear stress test 10/24/17: ? Nuclear stress EF: 50%. ? There was no ST segment deviation noted during stress. ? The study is normal. ? This is a low risk study. ? The left ventricular ejection fraction is mildly decreased (45-54%).  Echo 10/24/17: - Left ventricle: The cavity size was normal. Systolic function was normal. The estimated ejection fraction was in the range of 55% to 60%. Wall motion was normal; there were no regional wall motion abnormalities. The study is not technically sufficient to allow evaluation of LV diastolic function.  - Aortic valve: Transvalvular velocity was within the normal range. There was no stenosis. There was no regurgitation.  - Mitral valve: Transvalvular velocity was within the normal range. There was no evidence for stenosis. There was trivial regurgitation.  - Right ventricle: The cavity size was normal. Wall thickness was normal. Systolic function was normal.  - Tricuspid valve: There was trivial regurgitation.  - Pulmonary arteries: Systolic pressure was within the normal range. PA peak  pressure: 34 mm Hg (S). )      Anesthesia Quick Evaluation

## 2021-08-13 NOTE — Progress Notes (Signed)
Anesthesia Chart Review:   Case: [id number] Date/Time: 08/16/21 0715   Procedure: RIGHT ARM SECOND STAGE BASILIC VEIN TRANSPOSITION (Right) - PERIPHERAL NERVE BLOCK   Anesthesia type: Monitor Anesthesia Care   Pre-op diagnosis: ESRD   Location: [facility] OR ROOM 12 / [place] OR   Surgeons: [name], MD       DISCUSSION: Pt is 61 years old with hx CAD (calcifications on CT), CHF, heart murmur ("no murmur" documented by cardiologist Shirlee More on 05/05/21), HTN, DM, COPD, ESRD on hemodialysis   PROVIDERS: - PCP is Charlott Rakes, MD - Cardiologist is Shirlee More, MD. Last office visit 05/05/21 - Nephrologist is Elmarie Shiley, MD    LABS: Will be obtained day of surgery        IMAGES: CT chest lung cancer 02/23/21:  1. Lung-RADS 1, negative. Continue annual screening with low-dose chest CT without contrast in 12 months. 2. Coronary artery calcifications. 3. Emphysema and aortic atherosclerosis.  EKG 05/05/21: sinus bradycardia with 1st degree AV block.    CV: Cardiac event monitor 05/19/21:  - There was 1 symptomatic event with sinus rhythm. - Ventricular ectopy was rare, supraventricular ectopy was rare. - There are no pauses of 3 seconds or greater and no episodes of second or third-degree AV nodal block or sinus node exit block. - Minimum rate 45 bpm with sinus bradycardia occurring nocturnally.   Nuclear stress test 10/24/17:  Nuclear stress EF: 50%. There was no ST segment deviation noted during stress. The study is normal. This is a low risk study. The left ventricular ejection fraction is mildly decreased (45-54%).   Echo 10/24/17:  - Left ventricle: The cavity size was normal. Systolic function was normal. The estimated ejection fraction was in the range of 55% to 60%. Wall motion was normal; there were no regional wall motion abnormalities. The study is not technically sufficient to allow evaluation of LV diastolic function.  - Aortic valve: Transvalvular velocity was within  the normal range. There was no stenosis. There was no regurgitation.  - Mitral valve: Transvalvular velocity was within the normal range. There was no evidence for stenosis. There was trivial regurgitation.  - Right ventricle: The cavity size was normal. Wall thickness was normal. Systolic function was normal.  - Tricuspid valve: There was trivial regurgitation.  - Pulmonary arteries: Systolic pressure was within the normal range. PA peak pressure: 34 mm Hg (S).    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 Tremors whebn he moves it.   Tobacco abuse 09/29/2017   Type 2 diabetes mellitus ([place]) 10/17/2011   diet controlled - per pt does not check CBG at home- insurance did not approve -  08/13/21    Past Surgical History:  Procedure Laterality Date   ANTERIOR CERVICAL DECOMP/DISCECTOMY FUSION N/A 09/14/2017   Procedure: ANTERIOR CERVICAL DECOMPRESSION/DISCECTOMY FUSIONCERVICAL 4- CERVICAL 5, CERVICAL 5- CERVICAL 6;  Surgeon: Kary Kos, MD;  Location: [place];  Service: Neurosurgery;  Laterality: N/A;  ANTERIOR CERVICAL DECOMPRESSION/DISCECTOMY FUSIONCERVICAL 4- CERVICAL 5, CERVICAL 5- CERVICAL 6   AV FISTULA PLACEMENT Left 03/05/2021   Procedure: LEFT UPPER EXTREMITY RADIOBASILIC  ARTERIOVENOUS (AV) FISTULA CREATION;   Surgeon: [name], MD;  Location: [facility];  Service: Vascular;  Laterality: Left;  PERIPHERAL NERVE BLOCK   AV FISTULA PLACEMENT Left 04/21/2021   Procedure: INSERTION OF  LEFT ARM ARTERIOVENOUS (AV) GORE-TEX GRAFT;  Surgeon: [name], MD;  Location: [place];  Service: Vascular;  Laterality: Left;  PERIPHERAL NERVE BLOCK   AV FISTULA PLACEMENT Right 06/30/2021   Procedure: RIGHT ARM ARTERIOVENOUS (AV) FISTULA;  Surgeon: [name], MD;  Location: [facility] [place];  Service: Vascular;  Laterality: Right;  PERIPHERAL NERVE BLOCK   IR FLUORO GUIDE CV LINE LEFT  03/25/2021   UPPER EXTREMITY VENOGRAPHY Bilateral 06/04/2021   Procedure: CENTRAL  AND UPPER VENOGRAPHY;  Surgeon: [name], MD;  Location: [facility];  Service: Cardiovascular;  Laterality: Bilateral;    MEDICATIONS: No current facility-administered medications for this encounter.    Accu-Chek Softclix Lancets lancets   albuterol (VENTOLIN HFA) 108 (90 Base) MCG/ACT inhaler   amLODipine (NORVASC) 10 MG tablet   AURYXIA 1 GM 210 MG(Fe) tablet   Blood Glucose Monitoring Suppl (ACCU-CHEK GUIDE) w/Device KIT   buPROPion (WELLBUTRIN XL) 150 MG 24 hr tablet   citalopram (CELEXA) 40 MG tablet   cloNIDine (CATAPRES) 0.1 MG tablet   cloNIDine (CATAPRES) 0.2 MG tablet   ezetimibe (ZETIA) 10 MG tablet   fluticasone-salmeterol (ADVAIR) 250-50 MCG/ACT AEPB   furosemide (LASIX) 80 MG tablet   glucose blood (ACCU-CHEK GUIDE) test strip   hydrOXYzine (ATARAX) 25 MG tablet   levETIRAcetam (KEPPRA) 500 MG tablet   lidocaine (LIDODERM) 5 %   lisinopril (ZESTRIL) 40 MG tablet   Misc. Devices MISC   oxyCODONE-acetaminophen (PERCOCET) 10-325 MG tablet   Polyethyl Glycol-Propyl Glycol (SYSTANE OP)   pregabalin (LYRICA) 25 MG capsule   rosuvastatin (CRESTOR) 10 MG tablet   sodium zirconium cyclosilicate (LOKELMA) 10 g PACK packet    If labs acceptable day of surgery, I anticipate pt can proceed with surgery as  scheduled.  Willeen Cass, PhD, FNP-BC [facility]/Anesthesiology Phone: [phone] 08/13/2021 10:01 AM

## 2021-08-16 ENCOUNTER — Ambulatory Visit (HOSPITAL_BASED_OUTPATIENT_CLINIC_OR_DEPARTMENT_OTHER): Payer: Medicare Other | Admitting: Emergency Medicine

## 2021-08-16 ENCOUNTER — Encounter (HOSPITAL_COMMUNITY)
Admission: RE | Disposition: A | Discharge status: Home / Self Care | Payer: Self-pay | Source: Home / Self Care | Attending: Vascular Surgery

## 2021-08-16 ENCOUNTER — Encounter (HOSPITAL_COMMUNITY): Payer: Self-pay | Admitting: Vascular Surgery

## 2021-08-16 ENCOUNTER — Ambulatory Visit (HOSPITAL_COMMUNITY)
Admission: RE | Admit: 2021-08-16 | Discharge: 2021-08-16 | Disposition: A | Discharge status: Home / Self Care | Payer: Medicare Other | Attending: Vascular Surgery | Admitting: Vascular Surgery

## 2021-08-16 ENCOUNTER — Ambulatory Visit (HOSPITAL_COMMUNITY): Payer: Medicare Other | Admitting: Emergency Medicine

## 2021-08-16 ENCOUNTER — Other Ambulatory Visit: Payer: Self-pay

## 2021-08-16 ENCOUNTER — Other Ambulatory Visit (HOSPITAL_COMMUNITY): Payer: Self-pay

## 2021-08-16 DIAGNOSIS — Z452 Encounter for adjustment and management of vascular access device: Secondary | ICD-10-CM | POA: Insufficient documentation

## 2021-08-16 DIAGNOSIS — I132 Hypertensive heart and chronic kidney disease with heart failure and with stage 5 chronic kidney disease, or end stage renal disease: Secondary | ICD-10-CM | POA: Diagnosis not present

## 2021-08-16 DIAGNOSIS — I509 Heart failure, unspecified: Secondary | ICD-10-CM | POA: Insufficient documentation

## 2021-08-16 DIAGNOSIS — E1122 Type 2 diabetes mellitus with diabetic chronic kidney disease: Secondary | ICD-10-CM

## 2021-08-16 DIAGNOSIS — N189 Chronic kidney disease, unspecified: Secondary | ICD-10-CM

## 2021-08-16 DIAGNOSIS — I2510 Atherosclerotic heart disease of native coronary artery without angina pectoris: Secondary | ICD-10-CM | POA: Diagnosis not present

## 2021-08-16 DIAGNOSIS — N186 End stage renal disease: Secondary | ICD-10-CM

## 2021-08-16 DIAGNOSIS — I120 Hypertensive chronic kidney disease with stage 5 chronic kidney disease or end stage renal disease: Secondary | ICD-10-CM | POA: Diagnosis not present

## 2021-08-16 DIAGNOSIS — N185 Chronic kidney disease, stage 5: Secondary | ICD-10-CM | POA: Diagnosis not present

## 2021-08-16 DIAGNOSIS — I770 Arteriovenous fistula, acquired: Secondary | ICD-10-CM | POA: Insufficient documentation

## 2021-08-16 DIAGNOSIS — J449 Chronic obstructive pulmonary disease, unspecified: Secondary | ICD-10-CM | POA: Diagnosis not present

## 2021-08-16 HISTORY — PX: BASCILIC VEIN TRANSPOSITION: SHX5742

## 2021-08-16 LAB — POCT I-STAT, CHEM 8
BUN: 35 mg/dL — ABNORMAL HIGH (ref 8–23)
Calcium, Ion: 1.04 mmol/L — ABNORMAL LOW (ref 1.15–1.40)
Chloride: 94 mmol/L — ABNORMAL LOW (ref 98–111)
Creatinine, Ser: 9 mg/dL — ABNORMAL HIGH (ref 0.61–1.24)
Glucose, Bld: 139 mg/dL — ABNORMAL HIGH (ref 70–99)
HCT: 38 % — ABNORMAL LOW (ref 39.0–52.0)
Hemoglobin: 12.9 g/dL — ABNORMAL LOW (ref 13.0–17.0)
Potassium: 4.5 mmol/L (ref 3.5–5.1)
Sodium: 131 mmol/L — ABNORMAL LOW (ref 135–145)
TCO2: 29 mmol/L (ref 22–32)

## 2021-08-16 LAB — GLUCOSE, CAPILLARY
Glucose-Capillary: 139 mg/dL — ABNORMAL HIGH (ref 70–99)
Glucose-Capillary: 131 mg/dL — ABNORMAL HIGH (ref 70–99)

## 2021-08-16 SURGERY — TRANSPOSITION, VEIN, BASILIC
Anesthesia: Monitor Anesthesia Care | Site: Arm Upper | Laterality: Right

## 2021-08-16 MED ORDER — PAPAVERINE HCL 30 MG/ML IJ SOLN
INTRAMUSCULAR | Status: AC
  Filled 2021-08-16: qty 2

## 2021-08-16 MED ORDER — HEPARIN 6000 UNIT IRRIGATION SOLUTION
Status: DC | PRN
Start: 2021-08-16 — End: 2021-08-16
  Administered 2021-08-16: 1

## 2021-08-16 MED ORDER — DIPHENHYDRAMINE HCL 50 MG/ML IJ SOLN
INTRAMUSCULAR | Status: DC | PRN
  Administered 2021-08-16: 12.5 mg via INTRAVENOUS

## 2021-08-16 MED ORDER — OXYCODONE-ACETAMINOPHEN 10-325 MG PO TABS
1.0000 | ORAL_TABLET | Freq: Four times a day (QID) | ORAL | 0 refills | Status: DC | PRN
Start: 2021-08-16 — End: 2022-02-03
  Filled 2021-08-16: qty 20, 5d supply, fill #0

## 2021-08-16 MED ORDER — PROPOFOL 10 MG/ML IV BOLUS
INTRAVENOUS | Status: DC | PRN
Start: 2021-08-16 — End: 2021-08-16
  Administered 2021-08-16: 10 mg via INTRAVENOUS

## 2021-08-16 MED ORDER — PROTAMINE SULFATE 10 MG/ML IV SOLN
INTRAVENOUS | Status: DC | PRN
Start: 2021-08-16 — End: 2021-08-16
  Administered 2021-08-16: 15 mg via INTRAVENOUS
  Administered 2021-08-16: 10 mg via INTRAVENOUS

## 2021-08-16 MED ORDER — ONDANSETRON HCL 4 MG/2ML IJ SOLN
INTRAMUSCULAR | Status: DC | PRN
  Administered 2021-08-16: 4 mg via INTRAVENOUS

## 2021-08-16 MED ORDER — PROPOFOL 500 MG/50ML IV EMUL
INTRAVENOUS | Status: DC | PRN
  Administered 2021-08-16: 50 ug/kg/min via INTRAVENOUS

## 2021-08-16 MED ORDER — MIDAZOLAM HCL 2 MG/2ML IJ SOLN
INTRAMUSCULAR | Status: AC
  Filled 2021-08-16: qty 2

## 2021-08-16 MED ORDER — 0.9 % SODIUM CHLORIDE (POUR BTL) OPTIME
TOPICAL | Status: DC | PRN
  Administered 2021-08-16: 1000 mL

## 2021-08-16 MED ORDER — ROPIVACAINE HCL 7.5 MG/ML IJ SOLN
INTRAMUSCULAR | Status: DC | PRN
  Administered 2021-08-16: 30 mL via PERINEURAL

## 2021-08-16 MED ORDER — CEFAZOLIN SODIUM-DEXTROSE 2-4 GM/100ML-% IV SOLN
2.0000 g | INTRAVENOUS | Status: AC
  Administered 2021-08-16: 2 g via INTRAVENOUS
  Filled 2021-08-16: qty 100

## 2021-08-16 MED ORDER — MIDAZOLAM HCL 2 MG/2ML IJ SOLN
INTRAMUSCULAR | Status: DC | PRN
  Administered 2021-08-16 (×2): 1 mg via INTRAVENOUS

## 2021-08-16 MED ORDER — PHENYLEPHRINE HCL-NACL 20-0.9 MG/250ML-% IV SOLN
INTRAVENOUS | Status: DC | PRN
Start: 2021-08-16 — End: 2021-08-16
  Administered 2021-08-16: 50 ug/min via INTRAVENOUS

## 2021-08-16 MED ORDER — LIDOCAINE 2% (20 MG/ML) 5 ML SYRINGE
INTRAMUSCULAR | Status: DC | PRN
  Administered 2021-08-16: 40 mg via INTRAVENOUS

## 2021-08-16 MED ORDER — CHLORHEXIDINE GLUCONATE 4 % EX LIQD: 60.0000 mL | Freq: Once | CUTANEOUS | Status: DC

## 2021-08-16 MED ORDER — HEPARIN 6000 UNIT IRRIGATION SOLUTION
Status: AC
  Filled 2021-08-16: qty 500

## 2021-08-16 MED ORDER — CHLORHEXIDINE GLUCONATE 0.12 % MT SOLN
OROMUCOSAL | Status: AC
  Administered 2021-08-16: 15 mL via OROMUCOSAL
  Filled 2021-08-16: qty 15

## 2021-08-16 MED ORDER — HEPARIN SODIUM (PORCINE) 1000 UNIT/ML IJ SOLN
INTRAMUSCULAR | Status: AC
  Filled 2021-08-16: qty 10

## 2021-08-16 MED ORDER — LIDOCAINE-EPINEPHRINE (PF) 1 %-1:200000 IJ SOLN
INTRAMUSCULAR | Status: AC
  Filled 2021-08-16: qty 30

## 2021-08-16 MED ORDER — PROPOFOL 10 MG/ML IV BOLUS
INTRAVENOUS | Status: AC
  Filled 2021-08-16: qty 20

## 2021-08-16 MED ORDER — CHLORHEXIDINE GLUCONATE 0.12 % MT SOLN
15.0000 mL | Freq: Once | OROMUCOSAL | Status: AC
  Filled 2021-08-16: qty 15

## 2021-08-16 MED ORDER — HEPARIN SODIUM (PORCINE) 1000 UNIT/ML IJ SOLN
INTRAMUSCULAR | Status: DC | PRN
Start: 2021-08-16 — End: 2021-08-16
  Administered 2021-08-16: 5000 [IU] via INTRAVENOUS

## 2021-08-16 MED ORDER — LIDOCAINE-EPINEPHRINE (PF) 1 %-1:200000 IJ SOLN
INTRAMUSCULAR | Status: DC | PRN
Start: 2021-08-16 — End: 2021-08-16
  Administered 2021-08-16: 10 mL

## 2021-08-16 MED ORDER — SODIUM CHLORIDE 0.9 % IV SOLN: INTRAVENOUS | Status: DC

## 2021-08-16 MED ORDER — STERILE WATER FOR IRRIGATION IR SOLN
Status: DC | PRN
Start: 2021-08-16 — End: 2021-08-16
  Administered 2021-08-16: 1000 mL

## 2021-08-16 MED ORDER — FENTANYL CITRATE (PF) 250 MCG/5ML IJ SOLN
INTRAMUSCULAR | Status: DC | PRN
  Administered 2021-08-16: 50 ug via INTRAVENOUS
  Administered 2021-08-16 (×2): 25 ug via INTRAVENOUS
  Administered 2021-08-16: 50 ug via INTRAVENOUS

## 2021-08-16 MED ORDER — FENTANYL CITRATE (PF) 250 MCG/5ML IJ SOLN
INTRAMUSCULAR | Status: AC
  Filled 2021-08-16: qty 5

## 2021-08-16 MED ORDER — PROTAMINE SULFATE 10 MG/ML IV SOLN
INTRAVENOUS | Status: AC
  Filled 2021-08-16: qty 5

## 2021-08-16 MED ORDER — INSULIN ASPART 100 UNIT/ML IJ SOLN: 0.0000 [IU] | INTRAMUSCULAR | Status: DC | PRN

## 2021-08-16 SURGICAL SUPPLY — 37 items
ADH SKN CLS APL DERMABOND .7 (GAUZE/BANDAGES/DRESSINGS) ×1
APL PRP STRL LF DISP 70% ISPRP (MISCELLANEOUS) ×1
ARMBAND PINK RESTRICT EXTREMIT (MISCELLANEOUS) ×2 IMPLANT
BAG COUNTER SPONGE SURGICOUNT (BAG) ×2 IMPLANT
BAG SPNG CNTER NS LX DISP (BAG) ×1
CANISTER SUCT 3000ML PPV (MISCELLANEOUS) ×2 IMPLANT
CHLORAPREP W/TINT 26 (MISCELLANEOUS) ×2 IMPLANT
CLIP LIGATING EXTRA MED SLVR (CLIP) ×2 IMPLANT
CLIP LIGATING EXTRA SM BLUE (MISCELLANEOUS) ×2 IMPLANT
COVER PROBE W GEL 5X96 (DRAPES) ×2 IMPLANT
DERMABOND ADVANCED (GAUZE/BANDAGES/DRESSINGS) ×1
DERMABOND ADVANCED .7 DNX12 (GAUZE/BANDAGES/DRESSINGS) ×1 IMPLANT
ELECT REM PT RETURN 9FT ADLT (ELECTROSURGICAL) ×2
ELECTRODE REM PT RTRN 9FT ADLT (ELECTROSURGICAL) ×1 IMPLANT
GAUZE 4X4 16PLY ~~LOC~~+RFID DBL (SPONGE) ×1 IMPLANT
GLOVE SURG ENC MOIS LTX SZ7.5 (GLOVE) ×2 IMPLANT
GLOVE SURG PR MICRO ENCORE 7 (GLOVE) ×1 IMPLANT
GLOVE SURG UNDER POLY LF SZ6.5 (GLOVE) ×1 IMPLANT
GOWN STRL REUS W/ TWL LRG LVL3 (GOWN DISPOSABLE) ×2 IMPLANT
GOWN STRL REUS W/ TWL XL LVL3 (GOWN DISPOSABLE) ×1 IMPLANT
GOWN STRL REUS W/TWL LRG LVL3 (GOWN DISPOSABLE) ×6
GOWN STRL REUS W/TWL XL LVL3 (GOWN DISPOSABLE) ×2
KIT BASIN OR (CUSTOM PROCEDURE TRAY) ×2 IMPLANT
KIT TURNOVER KIT B (KITS) ×2 IMPLANT
NS IRRIG 1000ML POUR BTL (IV SOLUTION) ×2 IMPLANT
PACK CV ACCESS (CUSTOM PROCEDURE TRAY) ×2 IMPLANT
PAD ARMBOARD 7.5X6 YLW CONV (MISCELLANEOUS) ×4 IMPLANT
SLING ARM FOAM STRAP LRG (SOFTGOODS) IMPLANT
SPONGE T-LAP 18X18 ~~LOC~~+RFID (SPONGE) ×1 IMPLANT
SUT MNCRL AB 4-0 PS2 18 (SUTURE) ×2 IMPLANT
SUT PROLENE 6 0 BV (SUTURE) ×2 IMPLANT
SUT SILK 2 0 SH (SUTURE) ×1 IMPLANT
SUT VIC AB 3-0 SH 27 (SUTURE) ×4
SUT VIC AB 3-0 SH 27X BRD (SUTURE) ×1 IMPLANT
TOWEL GREEN STERILE (TOWEL DISPOSABLE) ×2 IMPLANT
UNDERPAD 30X36 HEAVY ABSORB (UNDERPADS AND DIAPERS) ×2 IMPLANT
WATER STERILE IRR 1000ML POUR (IV SOLUTION) ×2 IMPLANT

## 2021-08-16 NOTE — Transfer of Care (Signed)
Immediate Anesthesia Transfer of Care Note  Patient: [name] Slade Sr.  Procedure(s) Performed: RIGHT ARM SECOND STAGE BASILIC VEIN TRANSPOSITION (Right: Arm Upper)  Patient Location: PACU  Anesthesia Type:MAC combined with regional for post-op pain  Level of Consciousness: awake, alert  and oriented  Airway & Oxygen Therapy: Patient Spontanous Breathing  Post-op Assessment: Report given to RN and Post -op Vital signs reviewed and stable  Post vital signs: Reviewed and stable  Last Vitals:  Vitals Value Taken Time  BP 143/57 08/16/21 0937  Temp 36.5 C 08/16/21 0937  Pulse 64 08/16/21 0937  Resp 15 08/16/21 0937  SpO2 95 % 08/16/21 0937  Vitals shown include unvalidated device data.  Last Pain:  Vitals:   08/16/21 0603  TempSrc:   PainSc: 0-No pain         Complications: No notable events documented.

## 2021-08-16 NOTE — Anesthesia Procedure Notes (Signed)
Procedure Name: MAC Date/Time: 08/16/2021 7:36 AM Performed by: Mariea Clonts, CRNA Pre-anesthesia Checklist: Patient identified, Emergency Drugs available, Suction available, Timeout performed and Patient being monitored Patient Re-evaluated:Patient Re-evaluated prior to induction Oxygen Delivery Method: Simple face mask Preoxygenation: Pre-oxygenation with 100% oxygen

## 2021-08-16 NOTE — Anesthesia Postprocedure Evaluation (Signed)
Anesthesia Post Note  Patient: [name] Mauger Sr.  Procedure(s) Performed: RIGHT ARM SECOND STAGE BASILIC VEIN TRANSPOSITION (Right: Arm Upper)     Patient location during evaluation: PACU Anesthesia Type: Regional and MAC Level of consciousness: awake and alert Pain management: pain level controlled Vital Signs Assessment: post-procedure vital signs reviewed and stable Respiratory status: spontaneous breathing, nonlabored ventilation, respiratory function stable and patient connected to nasal cannula oxygen Cardiovascular status: blood pressure returned to baseline and stable Postop Assessment: no apparent nausea or vomiting Anesthetic complications: no   No notable events documented.  Last Vitals:  Vitals:   08/16/21 0937 08/16/21 0955  BP: (!) 143/57 (!) 144/54  Pulse: 64 64  Resp: 18 16  Temp: 36.5 C 36.5 C  SpO2: 95% 93%    Last Pain:  Vitals:   08/16/21 0955  TempSrc:   PainSc: 0-No pain                 [name] [name]

## 2021-08-16 NOTE — Interval H&P Note (Signed)
History and Physical Interval Note:  08/16/2021 7:22 AM  [name] [name] Sr.  has presented today for surgery, with the diagnosis of ESRD.  The various methods of treatment have been discussed with the patient and family. After consideration of risks, benefits and other options for treatment, the patient has consented to  Procedure(s) with comments: RIGHT ARM SECOND STAGE BASILIC VEIN TRANSPOSITION (Right) - PERIPHERAL NERVE BLOCK as a surgical intervention.  The patient's history has been reviewed, patient examined, no change in status, stable for surgery.  I have reviewed the patient's chart and labs.  Questions were answered to the patient's satisfaction.     [name]

## 2021-08-16 NOTE — Op Note (Signed)
DATE OF SERVICE: 08/16/2021  PATIENT:  [name] Slade Sr.  61 y.o. male  PRE-OPERATIVE DIAGNOSIS:  ESRD  POST-OPERATIVE DIAGNOSIS:  Same  PROCEDURE:   Right second stage ulnar-basilic arteriovenous fistula  SURGEON:  Surgeon(s) and Role:    * [name], MD - Primary  ASSISTANT: Leontine Locket, PA-C  An assistant was required to facilitate exposure and expedite the case.  ANESTHESIA:   regional and MAC  EBL: minimal  BLOOD ADMINISTERED:none  DRAINS: none   LOCAL MEDICATIONS USED:  LIDOCAINE   SPECIMEN:  none  COUNTS: confirmed correct.  TOURNIQUET:  none  PATIENT DISPOSITION:  PACU - hemodynamically stable.   Delay start of Pharmacological VTE agent (>24hrs) due to surgical blood loss or risk of bleeding: no  INDICATION FOR PROCEDURE: [name] is a 61 y.o. male with end-stage renal disease in need of permanent dialysis access.  I previously created a right ulnar basilic for stage AV fistula for him.  This is matured adequately. After careful discussion of risks, benefits, and alternatives the patient was offered second stage transposition. The patient understood and wished to proceed.  OPERATIVE FINDINGS: Fistula a bit small at the anastomosis, but generous throughout the rest of the arm.  Unremarkable second stage transposition.  Good thrill at completion.  DESCRIPTION OF PROCEDURE: After identification of the patient in the pre-operative holding area, the patient was transferred to the operating room. The patient was positioned supine on the operating room table. Anesthesia was induced. The right arm was prepped and draped in standard fashion. A surgical pause was performed confirming correct patient, procedure, and operative location.  Using intraoperative ultrasound the course of the right basilic vein was marked on the skin.  3 skip incisions were made over the course of the basilic vein fistula.  These were carried down through subcutaneous tissue until  the fistula was encountered.  The fascia was skeletonized from the axilla to the anastomosis, taking care to ligate and divide sidebranches, and to protect the medial antebrachial cutaneous nerve.   A subcutaneous tunnel was created over the biceps using a sheath tunneling device.  Patient was heparinized.  The fistula near the anastomosis was clamped.  The outflow in the axilla was clamped.  The fistula was divided.  The fistula was marked to ensure no twisting or kinking while delivering the fistula through the tunnel.  The fistula was tunneled through the arm and delivered near the previous anastomosis.  The fistula was spatulated proximally and distally.  The fistula was reanastomosed end-to-end using continuous running suture of 5-0 Prolene.  A palpable thrill was felt over the subcutaneous course of the tunnel.  Doppler flow was excellent in the proximal and distal fistula.  The wounds were copiously irrigated.  Hemostasis was ensured in the surgical bed.  The wounds were closed in layers using 3-0 Vicryl and 4-0 Monocryl.  Upon completion of the case instrument and sharps counts were confirmed correct. The patient was transferred to the PACU in good condition. I was present for all portions of the procedure.  Yevonne Aline. Stanford Breed, MD [facility] Phone Number: [phone] 08/16/2021 9:45 AM

## 2021-08-16 NOTE — Anesthesia Procedure Notes (Addendum)
Anesthesia Regional Block: Supraclavicular block   Pre-Anesthetic Checklist: , timeout performed,  Correct Patient, Correct Site, Correct Laterality,  Correct Procedure, Correct Position, site marked,  Risks and benefits discussed,  Surgical consent,  Pre-op evaluation,  At surgeon's request and post-op pain management  Laterality: Right  Prep: chloraprep       Needles:  Injection technique: Single-shot  Needle Type: Echogenic Stimulator Needle     Needle Length: 5cm  Needle Gauge: 22     Additional Needles:   Procedures:, nerve stimulator,,, ultrasound used (permanent image in chart),,     Nerve Stimulator or Paresthesia:  Response: quadraceps contraction, 0.45 mA  Additional Responses:   Narrative:  Start time: 08/16/2021 7:15 AM End time: 08/16/2021 7:20 AM Injection made incrementally with aspirations every 5 mL.  Performed by: Personally  Anesthesiologist: Janeece Riggers, MD  Additional Notes: Functioning IV was confirmed and monitors were applied.  A 61mm 22ga Arrow echogenic stimulator needle was used. Sterile prep and drape,hand hygiene and sterile gloves were used. Ultrasound guidance: relevant anatomy identified, needle position confirmed, local anesthetic spread visualized around nerve(s)., vascular puncture avoided.  Image printed for medical record. Negative aspiration and negative test dose prior to incremental administration of local anesthetic. The patient tolerated the procedure well.

## 2021-08-16 NOTE — Progress Notes (Signed)
Orthopedic Tech Progress Note Patient Details:  Amar Sippel Sr. 1961/04/20 [id number]  PACU RN called requesting an [place] for patient   Ortho Devices Type of Ortho Device: Arm sling Ortho Device/Splint Location: RUE Ortho Device/Splint Interventions: Ordered   Post Interventions Patient Tolerated: Well Instructions Provided: Care of device  Janit Pagan 08/16/2021, 10:25 AM

## 2021-08-16 NOTE — Discharge Instructions (Signed)
° °  [facility]  Discharge Instructions  AV Fistula or Graft Surgery for Dialysis Access  Please refer to the following instructions for your post-procedure care. Your surgeon or physician assistant will discuss any changes with you.  Activity  You may drive the day following your surgery, if you are comfortable and no longer taking prescription pain medication. Resume full activity as the soreness in your incision resolves.  Bathing/Showering  You may shower after you go home. Keep your incision dry for 48 hours. Do not soak in a bathtub, hot tub, or swim until the incision heals completely. You may not shower if you have a hemodialysis catheter.  Incision Care  Clean your incision with mild soap and water after 48 hours. Pat the area dry with a clean towel. You do not need a bandage unless otherwise instructed. Do not apply any ointments or creams to your incision. You may have skin glue on your incision. Do not peel it off. It will come off on its own in about one week. Your arm may swell a bit after surgery. To reduce swelling use pillows to elevate your arm so it is above your heart. Your doctor will tell you if you need to lightly wrap your arm with an ACE bandage.  Diet  Resume your normal diet. There are not special food restrictions following this procedure. In order to heal from your surgery, it is CRITICAL to get adequate nutrition. Your body requires vitamins, minerals, and protein. Vegetables are the best source of vitamins and minerals. Vegetables also provide the perfect balance of protein. Processed food has little nutritional value, so try to avoid this.  Medications  Resume taking all of your medications. If your incision is causing pain, you may take over-the counter pain relievers such as acetaminophen (Tylenol). If you were prescribed a stronger pain medication, please be aware these medications can cause nausea and constipation. Prevent  nausea by taking the medication with a snack or meal. Avoid constipation by drinking plenty of fluids and eating foods with high amount of fiber, such as fruits, vegetables, and grains.  Do not take Tylenol if you are taking prescription pain medications.  Follow up Your surgeon may want to see you in the office following your access surgery. If so, this will be arranged at the time of your surgery.  Please call us immediately for any of the following conditions:  Increased pain, redness, drainage (pus) from your incision site Fever of 101 degrees or higher Severe or worsening pain at your incision site Hand pain or numbness.  Reduce your risk of vascular disease:  Stop smoking. If you would like help, call [facility] at 1-800-QUIT-NOW 929-122-0913) or [place] at [place] your cholesterol Maintain a desired weight Control your diabetes Keep your blood pressure down  Dialysis  It will take several weeks to several months for your new dialysis access to be ready for use. Your surgeon will determine when it is okay to use it. Your nephrologist will continue to direct your dialysis. You can continue to use your Permcath until your new access is ready for use.   08/16/2021 [name] Hernandez Sr. [id number] 05/19/1961  Surgeon(s): [name], MD  Procedure(s): RIGHT ARM SECOND STAGE BASILIC VEIN TRANSPOSITION  x Do not stick fistula for 8 weeks    If you have any questions, please call the office at [phone].

## 2021-08-17 ENCOUNTER — Encounter (HOSPITAL_COMMUNITY): Payer: Self-pay | Admitting: Vascular Surgery

## 2021-09-03 ENCOUNTER — Other Ambulatory Visit: Payer: Self-pay

## 2021-09-03 ENCOUNTER — Ambulatory Visit: Payer: Medicare Other | Attending: Family Medicine | Admitting: *Deleted

## 2021-09-03 DIAGNOSIS — Z0000 Encounter for general adult medical examination without abnormal findings: Secondary | ICD-10-CM

## 2021-09-03 NOTE — Progress Notes (Signed)
Subjective:   [name] Bunda Sr. is a 61 y.o. male who presents for an Initial Medicare Annual Wellness Visit.I connected with  [name] Woodford Sr. on 09/03/21 by a audio enabled telemedicine application and verified that I am speaking with the correct person using two identifiers.  Patient Location: Home  Provider Location: Office/Clinic  I discussed the limitations of evaluation and management by telemedicine. The patient expressed understanding and agreed to proceed.   Review of Systems   Defer to PCP       Objective:    Today's Vitals   09/03/21 1059  PainSc: 0-No pain   There is no height or weight on file to calculate BMI.  Advanced Directives 09/03/2021 08/16/2021 06/30/2021 06/04/2021 04/21/2021 03/25/2021 03/05/2021  Does Patient Have a Medical Advance Directive? _0  No No  Would patient like information on creating a medical advance directive? No - Patient declined - No - Patient declined No - Patient declined No - Patient declined No - Patient declined No - Patient declined    Current Medications (verified) Outpatient Encounter Medications as of 09/03/2021  Medication Sig   albuterol (VENTOLIN HFA) 108 (90 Base) MCG/ACT inhaler INHALE 2 PUFFS INTO THE LUNGS EVERY 6 (SIX) HOURS AS NEEDED FOR WHEEZING OR SHORTNESS OF BREATH.   AURYXIA 1 GM 210 MG(Fe) tablet Take 420 mg by mouth 2 (two) times daily before a meal.   citalopram (CELEXA) 40 MG tablet Take 1 tablet (40 mg total) by mouth daily.   ezetimibe (ZETIA) 10 MG tablet Take 1 tablet (10 mg total) by mouth daily.   fluticasone-salmeterol (ADVAIR) 250-50 MCG/ACT AEPB Inhale 1 puff into the lungs 2 (two) times daily.   furosemide (LASIX) 80 MG tablet Take 80 mg by mouth 4 (four) times a week. Take 80 mg on Sun, Mon, Wed, and Fri). Skip doses on dialysis days (Tues, Thurs, and Sat)   hydrOXYzine (ATARAX) 25 MG tablet TAKE 1 TABLET (25 MG TOTAL) BY MOUTH 3 (THREE) TIMES DAILY AS NEEDED FOR ANXIETY.   levETIRAcetam  (KEPPRA) 500 MG tablet Take 1 tablet (500 mg total) by mouth 2 (two) times daily.   lisinopril (ZESTRIL) 40 MG tablet TAKE 1 TABLET BY MOUTH DAILY   Polyethyl Glycol-Propyl Glycol (SYSTANE OP) Place 1 drop into both eyes daily as needed (dry eyes).   rosuvastatin (CRESTOR) 10 MG tablet Take 1 tablet (10 mg total) by mouth daily.   Accu-Chek Softclix Lancets lancets Use as instructed tid before meals (Patient not taking: Reported on 09/03/2021)   amLODipine (NORVASC) 10 MG tablet Take 1 tablet by mouth every night (Patient not taking: Reported on 08/03/2021)   Blood Glucose Monitoring Suppl (ACCU-CHEK GUIDE) w/Device KIT Use as directed tid (Patient not taking: Reported on 09/03/2021)   buPROPion (WELLBUTRIN XL) 150 MG 24 hr tablet Take 1 tablet (150 mg total) by mouth daily. (Patient not taking: Reported on 08/03/2021)   cloNIDine (CATAPRES) 0.1 MG tablet Take 1 tablet by mouth twice a day (Patient not taking: Reported on 08/03/2021)   cloNIDine (CATAPRES) 0.2 MG tablet Take 1 tablet (0.2 mg total) by mouth 2 (two) times daily. Please keep 04-06-21 appointment for further refills. 2 nd attempt (Patient not taking: Reported on 08/13/2021)   glucose blood (ACCU-CHEK GUIDE) test strip Use as instructed 3 times daily   lidocaine (LIDODERM) 5 % Place 1 patch onto the skin daily. Remove & Discard patch within 12 hours or as directed by MD (Patient not taking: Reported on 08/03/2021)  Misc. Devices MISC Blood pressure monitor.  Diagnosis-hypertension.   oxyCODONE-acetaminophen (PERCOCET) 10-325 MG tablet Take 1 tablet by mouth every 6 (six) hours as needed for pain. (Patient not taking: Reported on 09/03/2021)   pregabalin (LYRICA) 25 MG capsule Take 1 capsule (25 mg total) by mouth at bedtime. (Patient not taking: Reported on 09/03/2021)   No facility-administered encounter medications on file as of 09/03/2021.    Allergies (verified) Patient has no known allergies.   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 Tremors whebn he moves it.   Tobacco abuse 09/29/2017   Type 2 diabetes mellitus ([place]) 10/17/2011   diet controlled - per pt does not check CBG at home- insurance did not approve - 08/13/21   Past Surgical History:  Procedure Laterality Date   ANTERIOR CERVICAL DECOMP/DISCECTOMY FUSION N/A 09/14/2017   Procedure: ANTERIOR CERVICAL DECOMPRESSION/DISCECTOMY FUSIONCERVICAL 4- CERVICAL 5, CERVICAL 5- CERVICAL 6;  Surgeon: Kary Kos, MD;  Location: [facility];  Service: Neurosurgery;  Laterality: N/A;  ANTERIOR CERVICAL DECOMPRESSION/DISCECTOMY FUSIONCERVICAL 4- CERVICAL 5, CERVICAL 5- CERVICAL 6   AV FISTULA PLACEMENT Left 03/05/2021   Procedure: LEFT UPPER EXTREMITY RADIOBASILIC  ARTERIOVENOUS (AV) FISTULA CREATION;  Surgeon: [name], MD;  Location: [place];  Service: Vascular;  Laterality: Left;  PERIPHERAL NERVE BLOCK   AV FISTULA PLACEMENT Left 04/21/2021   Procedure: INSERTION OF  LEFT ARM ARTERIOVENOUS (AV) GORE-TEX GRAFT;  Surgeon: [name], MD;  Location: [place];  Service: Vascular;  Laterality: Left;  PERIPHERAL NERVE BLOCK   AV FISTULA PLACEMENT Right 06/30/2021   Procedure: RIGHT ARM ARTERIOVENOUS (AV) FISTULA;  Surgeon: [name], MD;  Location: [place];  Service: Vascular;  Laterality: Right;   PERIPHERAL NERVE BLOCK   [place] Right 08/16/2021   Procedure: RIGHT ARM SECOND STAGE BASILIC VEIN TRANSPOSITION;  Surgeon: [name], MD;  Location: [place];  Service: Vascular;  Laterality: Right;  PERIPHERAL NERVE BLOCK, LOCAL GIVEN   IR FLUORO GUIDE CV LINE LEFT  03/25/2021   UPPER EXTREMITY VENOGRAPHY Bilateral 06/04/2021   Procedure: CENTRAL  AND UPPER VENOGRAPHY;  Surgeon: [name], MD;  Location: [facility];  Service: Cardiovascular;  Laterality: Bilateral;   Family History  Problem Relation Age of Onset   Diabetes Mother    Scoliosis Mother    Kidney cancer Father    Lung cancer Father    Hypertension Brother    Colon cancer Neg Hx    Pancreatic cancer Neg Hx    Prostate cancer Neg Hx    Rectal cancer Neg Hx    Stomach cancer Neg Hx    Social History  Socioeconomic History   Marital status: Single    Spouse name: Not on file   Number of children: Not on file   Years of education: Not on file   Highest education level: Not on file  Occupational History   Not on file  Tobacco Use   Smoking status: Every Day    Packs/day: 1.50    Years: 42.00    Pack years: 63.00    Types: Cigarettes   Smokeless tobacco: Never   Tobacco comments:    1 pack a day   Vaping Use   Vaping Use: Never used  Substance and Sexual Activity   Alcohol use: Not Currently    Alcohol/week: 10.0 standard drinks    Types: 10 Cans of beer per week    Comment: quit September 2022   Drug use: Not Currently    Types: Marijuana    Comment: ocassionally   Sexual activity: Not on file  Other Topics Concern   Not on file  Social History Narrative   Not on file   Social Determinants of Health   Financial Resource Strain: Low Risk    Difficulty of Paying Living Expenses: Not hard at all  Food Insecurity: No Food Insecurity   Worried About Charity fundraiser in the Last Year: Never true   [place] in the Last Year: Never true  Transportation  Needs: No Transportation Needs   Lack of Transportation (Medical): No   Lack of Transportation (Non-Medical): No  Physical Activity: Not on file  Stress: No Stress Concern Present   Feeling of Stress : Only a little  Social Connections: Moderately Isolated   Frequency of Communication with Friends and Family: More than three times a week   Frequency of Social Gatherings with Friends and Family: More than three times a week   Attends Religious Services: 1 to 4 times per year   Active Member of [employer] or Organizations: No   Attends Music therapist: Never   Marital Status: Divorced    Tobacco Counseling Ready to quit: Not Answered Counseling given: Not Answered Tobacco comments: 1 pack a day    Clinical Intake:     Pain : No/denies pain Pain Score: 0-No pain     Diabetes: Yes CBG done?: No Did pt. bring in CBG monitor from home?: No     Diabetic Nutrition Risk Assessment:  Has the patient had any N/V/D within the last 2 months?  No  Does the patient have any non-healing wounds?  No  Has the patient had any unintentional weight loss or weight gain?  No   Diabetes:  Is the patient diabetic?  Yes  If diabetic, was a CBG obtained today?  No  Did the patient bring in their glucometer from home?  No  How often do you monitor your CBG's? Does not have glucometer or devices to do so. Will follow up with pharmacy.    Financial Strains and Diabetes Management:  Are you having any financial strains with the device, your supplies or your medication? No .  Does the patient want to be seen by Chronic Care Management for management of their diabetes?  No  Would the patient like to be referred to a Nutritionist or for Diabetic Management?  No   Diabetic Exams:  Diabetic Eye Exam: Completed annually Diabetic Foot Exam: Completed August 22,2022          Activities of Daily Living In your present state of health, do you  have any difficulty performing the  following activities: 08/16/2021 08/16/2021  Hearing? - N  Vision? - N  Difficulty concentrating or making decisions? - N  Walking or climbing stairs? - Y  Dressing or bathing? - N  Doing errands, shopping? N -  Some recent data might be hidden    Patient Care Team: Charlott Rakes, MD as PCP - General (Family Medicine) [name] [name], [facility]  Indicate any recent Medical Services you may have received from other than [facility] providers in the past year (date may be approximate).     Assessment:   This is a routine wellness examination for [facility].  Hearing/Vision screen No results found.  Dietary issues and exercise activities discussed:     Goals Addressed   None   Depression Screen PHQ 2/9 Scores 09/03/2021 05/18/2021 02/15/2021 05/04/2020 01/23/2020 10/07/2019 01/21/2019  PHQ - 2 Score 0 0 3 1 0 6 2  PHQ- 9 Score - 0 _0 Fall Risk Fall Risk  09/03/2021 05/18/2021 04/27/2020 01/23/2020 01/21/2019  Falls in the past year? 1 0 0 0 0  Comment October 2022 - - - -  Number falls in past yr: 1 - - - -  Injury with Fall? 0 - - - -  Risk for fall due to : Impaired balance/gait;Impaired mobility - - No Fall Risks -  Follow up Falls evaluation completed - - - -    FALL RISK PREVENTION PERTAINING TO THE HOME:  Any stairs in or around the home? No  If so, are there any without handrails? No  Home free of loose throw rugs in walkways, pet beds, electrical cords, etc? Yes  Adequate lighting in your home to reduce risk of falls? Yes   ASSISTIVE DEVICES UTILIZED TO PREVENT FALLS:  Life alert? No  Use of a cane, walker or w/c? Yes  Grab bars in the bathroom? No  Shower chair or bench in shower? No  Elevated toilet seat or a handicapped toilet? No   TIMED UP AND GO:  Was the test performed? No .  Length of time to ambulate 10 feet: not able to perform due to a televisit.     Cognitive Function:     6CIT Screen 09/03/2021  What Year? 0 points  What month? 0  points  What time? 0 points  Count back from 20 0 points  Months in reverse 0 points  Repeat phrase 0 points  Total Score 0    Immunizations Immunization History  Administered Date(s) Administered   Influenza, High Dose Seasonal PF 06/13/2018   Influenza-Unspecified 03/30/2021   Moderna Sars-Covid-2 Vaccination 09/18/2019, 10/16/2019   PNEUMOCOCCAL CONJUGATE-20 02/15/2021   Pneumococcal Polysaccharide-23 06/13/2018    TDAP status: Up to date  Flu Vaccine status: Up to date  Pneumococcal vaccine status: Up to date  Covid-19 vaccine status: Information provided on how to obtain vaccines.   Qualifies for Shingles Vaccine? Yes   Zostavax completed No   Shingrix Completed?: No.    Education has been provided regarding the importance of this vaccine. Patient has been advised to call insurance company to determine out of pocket expense if they have not yet received this vaccine. Advised may also receive vaccine at local pharmacy or Health Dept. Verbalized acceptance and understanding.  Screening Tests Health Maintenance  Topic Date Due   TETANUS/TDAP  Never done   Zoster Vaccines- Shingrix (1 of 2) Never done   COVID-19 Vaccine (3 - Moderna risk series) 11/13/2019  HEMOGLOBIN A1C  11/15/2021   FOOT EXAM  02/15/2022   OPHTHALMOLOGY EXAM  03/22/2022   Fecal DNA (Cologuard)  02/19/2023   INFLUENZA VACCINE  Completed   Hepatitis C Screening  Completed   HIV Screening  Completed   HPV VACCINES  Aged Out   COLONOSCOPY (Pts 45-79yr Insurance coverage will need to be confirmed)  Discontinued    Health Maintenance  Health Maintenance Due  Topic Date Due   TETANUS/TDAP  Never done   Zoster Vaccines- Shingrix (1 of 2) Never done   COVID-19 Vaccine (3 - Moderna risk series) 11/13/2019    Colorectal cancer screening: Type of screening: Colonoscopy. Completed 06/18/2020. Repeat every 5-10 years or per GI specialist.  Lung Cancer Screening: (Low Dose CT Chest recommended if Age  63-80years, 30 pack-year currently smoking OR have quit w/in 15years.) does qualify.   Lung Cancer Screening Referral: defer to PCP  Additional Screening:  Hepatitis C Screening: does not qualify; Completed January 14,2020  Vision Screening: Recommended annual ophthalmology exams for early detection of glaucoma and other disorders of the eye. Is the patient up to date with their annual eye exam?  Yes  Who is the provider or what is the name of the office in which the patient attends annual eye exams?  If pt is not established with a provider, would they like to be referred to a provider to establish care? Patient is established.   Dental Screening: Recommended annual dental exams for proper oral hygiene  Community Resource Referral / Chronic Care Management: CRR required this visit?  No   CCM required this visit?  No      Plan:     I have personally reviewed and noted the following in the patient's chart:   Medical and social history Use of alcohol, tobacco or illicit drugs  Current medications and supplements including opioid prescriptions. Patient is not currently taking opioid prescriptions. Functional ability and status Nutritional status Physical activity Advanced directives List of other physicians Hospitalizations, surgeries, and ER visits in previous 12 months Vitals Screenings to include cognitive, depression, and falls Referrals and appointments  In addition, I have reviewed and discussed with patient certain preventive protocols, quality metrics, and best practice recommendations. A written personalized care plan for preventive services as well as general preventive health recommendations were provided to patient.     BCarilyn Goodpasture RN   09/03/2021   Nurse Notes: Non-Face-to-Face 40 minutes   Mr. CSharlyne Pacas, Thank you for taking time to come for your Medicare Wellness Visit. I appreciate your ongoing commitment to your health goals. Please review the  following plan we discussed and let me know if I can assist you in the future.   These are the goals we discussed:  Goals   None     This is a list of the screening recommended for you and due dates:  Health Maintenance  Topic Date Due   Tetanus Vaccine  Never done   Zoster (Shingles) Vaccine (1 of 2) Never done   COVID-19 Vaccine (3 - Moderna risk series) 11/13/2019   Hemoglobin A1C  11/15/2021   Complete foot exam   02/15/2022   Eye exam for diabetics  03/22/2022   Cologuard (Stool DNA test)  02/19/2023   Flu Shot  Completed   Hepatitis C Screening: USPSTF Recommendation to screen - Ages 18-79 yo.  Completed   HIV Screening  Completed   HPV Vaccine  Aged Out   Colon Cancer Screening  Discontinued

## 2021-09-03 NOTE — Patient Instructions (Signed)
Diabetes Mellitus and Foot Care Foot care is an important part of your health, especially when you have diabetes. Diabetes may cause you to have problems because of poor blood flow (circulation) to your feet and legs, which can cause your skin to: Become thinner and drier. Break more easily. Heal more slowly. Peel and crack. You may also have nerve damage (neuropathy) in your legs and feet, causing decreased feeling in them. This means that you may not notice minor injuries to your feet that could lead to more serious problems. Noticing and addressing any potential problems early is the best way to prevent future foot problems. How to care for your feet Foot hygiene  Wash your feet daily with warm water and mild soap. Do not use hot water. Then, pat your feet and the areas between your toes until they are completely dry. Do not soak your feet as this can dry your skin. Trim your toenails straight across. Do not dig under them or around the cuticle. File the edges of your nails with an emery board or nail file. Apply a moisturizing lotion or petroleum jelly to the skin on your feet and to dry, brittle toenails. Use lotion that does not contain alcohol and is unscented. Do not apply lotion between your toes. Shoes and socks Wear clean socks or stockings every day. Make sure they are not too tight. Do not wear knee-high stockings since they may decrease blood flow to your legs. Wear shoes that fit properly and have enough cushioning. Always look in your shoes before you put them on to be sure there are no objects inside. To break in new shoes, wear them for just a few hours a day. This prevents injuries on your feet. Wounds, scrapes, corns, and calluses  Check your feet daily for blisters, cuts, bruises, sores, and redness. If you cannot see the bottom of your feet, use a mirror or ask someone for help. Do not cut corns or calluses or try to remove them with medicine. If you find a minor scrape,  cut, or break in the skin on your feet, keep it and the skin around it clean and dry. You may clean these areas with mild soap and water. Do not clean the area with peroxide, alcohol, or iodine. If you have a wound, scrape, corn, or callus on your foot, look at it several times a day to make sure it is healing and not infected. Check for: Redness, swelling, or pain. Fluid or blood. Warmth. Pus or a bad smell. General tips Do not cross your legs. This may decrease blood flow to your feet. Do not use heating pads or hot water bottles on your feet. They may burn your skin. If you have lost feeling in your feet or legs, you may not know this is happening until it is too late. Protect your feet from hot and cold by wearing shoes, such as at the beach or on hot pavement. Schedule a complete foot exam at least once a year (annually) or more often if you have foot problems. Report any cuts, sores, or bruises to your health care provider immediately. Where to find more information American Diabetes Association: www.diabetes.org Association of Diabetes Care & Education Specialists: www.diabeteseducator.org Contact a health care provider if: You have a medical condition that increases your risk of infection and you have any cuts, sores, or bruises on your feet. You have an injury that is not healing. You have redness on your legs or feet. You  feel burning or tingling in your legs or feet. You have pain or cramps in your legs and feet. Your legs or feet are numb. Your feet always feel cold. You have pain around any toenails. Get help right away if: You have a wound, scrape, corn, or callus on your foot and: You have pain, swelling, or redness that gets worse. You have fluid or blood coming from the wound, scrape, corn, or callus. Your wound, scrape, corn, or callus feels warm to the touch. You have pus or a bad smell coming from the wound, scrape, corn, or callus. You have a fever. You have a red  line going up your leg. Summary Check your feet every day for blisters, cuts, bruises, sores, and redness. Apply a moisturizing lotion or petroleum jelly to the skin on your feet and to dry, brittle toenails. Wear shoes that fit properly and have enough cushioning. If you have foot problems, report any cuts, sores, or bruises to your health care provider immediately. Schedule a complete foot exam at least once a year (annually) or more often if you have foot problems. This information is not intended to replace advice given to you by your health care provider. Make sure you discuss any questions you have with your health care provider. Document Revised: 01/02/2020 Document Reviewed: 01/02/2020 Elsevier Patient Education  2022 [place] Prevention in the Home, Adult Falls can cause injuries and can happen to people of all ages. There are many things you can do to make your home safe and to help prevent falls. Ask for help when making these changes. What actions can I take to prevent falls? General Instructions Use good lighting in all rooms. Replace any light bulbs that burn out. Turn on the lights in dark areas. Use night-lights. Keep items that you use often in easy-to-reach places. Lower the shelves around your home if needed. Set up your furniture so you have a clear path. Avoid moving your furniture around. Do not have throw rugs or other things on the floor that can make you trip. Avoid walking on wet floors. If any of your floors are uneven, fix them. Add color or contrast paint or tape to clearly mark and help you see: Grab bars or handrails. First and last steps of staircases. Where the edge of each step is. If you use a stepladder: Make sure that it is fully opened. Do not climb a closed stepladder. Make sure the sides of the stepladder are locked in place. Ask someone to hold the stepladder while you use it. Know where your pets are when moving through your  home. What can I do in the bathroom?   Keep the floor dry. Clean up any water on the floor right away. Remove soap buildup in the tub or shower. Use nonskid mats or decals on the floor of the tub or shower. Attach bath mats securely with double-sided, nonslip rug tape. If you need to sit down in the shower, use a plastic, nonslip stool. Install grab bars by the toilet and in the tub and shower. Do not use towel bars as grab bars. What can I do in the bedroom? Make sure that you have a light by your bed that is easy to reach. Do not use any sheets or blankets for your bed that hang to the floor. Have a firm chair with side arms that you can use for support when you get dressed. What can I do in the kitchen? Clean up  any spills right away. If you need to reach something above you, use a step stool with a grab bar. Keep electrical cords out of the way. Do not use floor polish or wax that makes floors slippery. What can I do with my stairs? Do not leave any items on the stairs. Make sure that you have a light switch at the top and the bottom of the stairs. Make sure that there are handrails on both sides of the stairs. Fix handrails that are broken or loose. Install nonslip stair treads on all your stairs. Avoid having throw rugs at the top or bottom of the stairs. Choose a carpet that does not hide the edge of the steps on the stairs. Check carpeting to make sure that it is firmly attached to the stairs. Fix carpet that is loose or worn. What can I do on the outside of my home? Use bright outdoor lighting. Fix the edges of walkways and driveways and fix any cracks. Remove anything that might make you trip as you walk through a door, such as a raised step or threshold. Trim any bushes or trees on paths to your home. Check to see if handrails are loose or broken and that both sides of all steps have handrails. Install guardrails along the edges of any raised decks and porches. Clear paths  of anything that can make you trip, such as tools or rocks. Have leaves, snow, or ice cleared regularly. Use sand or salt on paths during winter. Clean up any spills in your garage right away. This includes grease or oil spills. What other actions can I take? Wear shoes that: Have a low heel. Do not wear high heels. Have rubber bottoms. Feel good on your feet and fit well. Are closed at the toe. Do not wear open-toe sandals. Use tools that help you move around if needed. These include: Canes. Walkers. Scooters. Crutches. Review your medicines with your doctor. Some medicines can make you feel dizzy. This can increase your chance of falling. Ask your doctor what else you can do to help prevent falls. Where to find more information Centers for Disease Control and Prevention, [employer]: http://www.wolf.info/ National Institute on Aging: http://kim-miller.com/ Contact a doctor if: You are afraid of falling at home. You feel weak, drowsy, or dizzy at home. You fall at home. Summary There are many simple things that you can do to make your home safe and to help prevent falls. Ways to make your home safe include removing things that can make you trip and installing grab bars in the bathroom. Ask for help when making these changes in your home. This information is not intended to replace advice given to you by your health care provider. Make sure you discuss any questions you have with your health care provider. Document Revised: 01/15/2020 Document Reviewed: 01/15/2020 Elsevier Patient Education  [place].  Steps to Quit Smoking Smoking tobacco is the leading cause of preventable death. It can affect almost every organ in the body. Smoking puts you and people around you at risk for many serious, long-lasting (chronic) diseases. Quitting smoking can be hard, but it is one of the best things that you can do for your health. It is never too late to quit. How do I get ready to quit? When you decide to  quit smoking, make a plan to help you succeed. Before you quit: Pick a date to quit. Set a date within the next 2 weeks to give you time to  prepare. Write down the reasons why you are quitting. Keep this list in places where you will see it often. Tell your family, friends, and co-workers that you are quitting. Their support is important. Talk with your doctor about the choices that may help you quit. Find out if your health insurance will pay for these treatments. Know the people, places, things, and activities that make you want to smoke (triggers). Avoid them. What first steps can I take to quit smoking? Throw away all cigarettes at home, at work, and in your car. Throw away the things that you use when you smoke, such as ashtrays and lighters. Clean your car. Make sure to empty the ashtray. Clean your home, including curtains and carpets. What can I do to help me quit smoking? Talk with your doctor about taking medicines and seeing a counselor at the same time. You are more likely to succeed when you do both. If you are pregnant or breastfeeding, talk with your doctor about counseling or other ways to quit smoking. Do not take medicine to help you quit smoking unless your doctor tells you to do so. To quit smoking: Quit right away Quit smoking totally, instead of slowly cutting back on how much you smoke over a period of time. Go to counseling. You are more likely to quit if you go to counseling sessions regularly. Take medicine You may take medicines to help you quit. Some medicines need a prescription, and some you can buy over-the-counter. Some medicines may contain a drug called nicotine to replace the nicotine in cigarettes. Medicines may: Help you to stop having the desire to smoke (cravings). Help to stop the problems that come when you stop smoking (withdrawal symptoms). Your doctor may ask you to use: Nicotine patches, gum, or lozenges. Nicotine inhalers or sprays. Non-nicotine  medicine that is taken by mouth. Find resources Find resources and other ways to help you quit smoking and remain smoke-free after you quit. These resources are most helpful when you use them often. They include: Online chats with a Social worker. Phone quitlines. Printed Furniture conservator/restorer. Support groups or group counseling. Text messaging programs. Mobile phone apps. Use apps on your mobile phone or tablet that can help you stick to your quit plan. There are many free apps for mobile phones and tablets as well as websites. Examples include Quit Guide from the [employer] and smokefree.gov  What things can I do to make it easier to quit?  Talk to your family and friends. Ask them to support and encourage you. Call a phone quitline (1-800-QUIT-NOW), reach out to support groups, or work with a Social worker. Ask people who smoke to not smoke around you. Avoid places that make you want to smoke, such as: Bars. Parties. Smoke-break areas at work. Spend time with people who do not smoke. Lower the stress in your life. Stress can make you want to smoke. Try these things to help your stress: Getting regular exercise. Doing deep-breathing exercises. Doing yoga. Meditating. Doing a body scan. To do this, close your eyes, focus on one area of your body at a time from head to toe. Notice which parts of your body are tense. Try to relax the muscles in those areas. How will I feel when I quit smoking? Day 1 to 3 weeks Within the first 24 hours, you may start to have some problems that come from quitting tobacco. These problems are very bad 2-3 days after you quit, but they do not often last for more  than 2-3 weeks. You may get these symptoms: Mood swings. Feeling restless, nervous, angry, or annoyed. Trouble concentrating. Dizziness. Strong desire for high-sugar foods and nicotine. Weight gain. Trouble pooping (constipation). Feeling like you may vomit (nausea). Coughing or a sore throat. Changes in how  the medicines that you take for other issues work in your body. Depression. Trouble sleeping (insomnia). Week 3 and afterward After the first 2-3 weeks of quitting, you may start to notice more positive results, such as: Better sense of smell and taste. Less coughing and sore throat. Slower heart rate. Lower blood pressure. Clearer skin. Better breathing. Fewer sick days. Quitting smoking can be hard. Do not give up if you fail the first time. Some people need to try a few times before they succeed. Do your best to stick to your quit plan, and talk with your doctor if you have any questions or concerns. Summary Smoking tobacco is the leading cause of preventable death. Quitting smoking can be hard, but it is one of the best things that you can do for your health. When you decide to quit smoking, make a plan to help you succeed. Quit smoking right away, not slowly over a period of time. When you start quitting, seek help from your doctor, family, or friends. This information is not intended to replace advice given to you by your health care provider. Make sure you discuss any questions you have with your health care provider. Document Revised: 02/19/2021 Document Reviewed: 09/01/2018 Elsevier Patient Education  [place].

## 2021-09-06 ENCOUNTER — Other Ambulatory Visit: Payer: Self-pay | Admitting: *Deleted

## 2021-09-06 DIAGNOSIS — N186 End stage renal disease: Secondary | ICD-10-CM

## 2021-09-10 ENCOUNTER — Other Ambulatory Visit: Payer: Self-pay

## 2021-09-21 ENCOUNTER — Encounter: Payer: Self-pay | Admitting: Vascular Surgery

## 2021-09-21 ENCOUNTER — Other Ambulatory Visit: Payer: Self-pay

## 2021-09-21 ENCOUNTER — Ambulatory Visit (HOSPITAL_COMMUNITY)
Admission: RE | Admit: 2021-09-21 | Discharge: 2021-09-21 | Disposition: A | Discharge status: Home / Self Care | Payer: Medicare Other | Source: Ambulatory Visit | Attending: Vascular Surgery | Admitting: Vascular Surgery

## 2021-09-21 ENCOUNTER — Ambulatory Visit (INDEPENDENT_AMBULATORY_CARE_PROVIDER_SITE_OTHER): Payer: Medicare Other | Admitting: Vascular Surgery

## 2021-09-21 VITALS — BP 122/77 | HR 72 | Temp 98.2°F | Resp 20 | Ht 74.0 in | Wt 219.0 lb

## 2021-09-21 DIAGNOSIS — N186 End stage renal disease: Secondary | ICD-10-CM | POA: Insufficient documentation

## 2021-09-21 DIAGNOSIS — Z992 Dependence on renal dialysis: Secondary | ICD-10-CM

## 2021-09-21 NOTE — Progress Notes (Signed)
[facility] [place] ?PROGRESS NOTE ? ?ASSESSMENT / PLAN: ?[name] Corp Sr. is a 61 y.o. male status post right basilic vein transposition 08/16/21. The fistula is maturing appropriately. I think it is safe to access with small needles. He can follow up with me as needed, or if he has issues with access. ? ?SUBJECTIVE: ?No complaints.  ? ?OBJECTIVE: ?BP 122/77 (BP Location: Left Arm, Patient Position: Sitting, Cuff Size: Normal)   Pulse 72   Temp 98.2 ?F (36.8 ?C)   Resp 20   Ht '6\' 2"'$  (1.88 m)   Wt 219 lb (99.3 kg)   SpO2 98%   BMI 28.12 kg/m?  ? ?No distress ?Right arm fistula with smooth thrill. ? ? ?  Latest Ref Rng & Units 08/16/2021  ?  6:21 AM 06/30/2021  ?  6:56 AM 06/04/2021  ?  6:00 AM  ?CBC  ?Hemoglobin 13.0 - 17.0 g/dL 12.9   13.3   13.3    ?Hematocrit 39.0 - 52.0 % 38.0   39.0   39.0    ?  ? ? ?  Latest Ref Rng & Units 08/16/2021  ?  6:21 AM 06/30/2021  ?  6:56 AM 06/04/2021  ?  6:00 AM  ?CMP  ?Glucose 70 - 99 mg/dL 139   127   135    ?BUN 8 - 23 mg/dL 35   30   21    ?Creatinine 0.61 - 1.24 mg/dL 9.00   7.10   6.90    ?Sodium 135 - 145 mmol/L 131   137   132    ?Potassium 3.5 - 5.1 mmol/L 4.5   4.9   4.4    ?Chloride 98 - 111 mmol/L 94   102   92    ? ? ?CrCl cannot be calculated (Patient's most recent lab result is older than the maximum 21 days allowed.). ? ?AVF duplex shows adequate diameter and flow volume to use fistula. ? ?Yevonne Aline. Stanford Breed, MD ?[facility] ?Office Phone Number: [phone] ?09/21/2021 2:23 PM ? ? ? ?

## 2021-10-01 ENCOUNTER — Other Ambulatory Visit: Payer: Self-pay

## 2021-10-01 ENCOUNTER — Other Ambulatory Visit: Payer: Self-pay | Admitting: Cardiology

## 2021-10-01 DIAGNOSIS — E1169 Type 2 diabetes mellitus with other specified complication: Secondary | ICD-10-CM

## 2021-10-04 ENCOUNTER — Other Ambulatory Visit: Payer: Self-pay

## 2021-10-05 ENCOUNTER — Other Ambulatory Visit: Payer: Self-pay

## 2021-10-05 MED ORDER — FUROSEMIDE 80 MG PO TABS
ORAL_TABLET | ORAL | 3 refills | Status: DC
  Filled 2021-10-05: qty 50, 90d supply, fill #0

## 2021-10-12 ENCOUNTER — Other Ambulatory Visit: Payer: Self-pay

## 2021-10-19 ENCOUNTER — Other Ambulatory Visit: Payer: Self-pay

## 2021-10-19 MED ORDER — LISINOPRIL 20 MG PO TABS
20.0000 mg | ORAL_TABLET | Freq: Every day | ORAL | 3 refills | Status: DC
  Filled 2021-10-19: qty 90, 90d supply, fill #0

## 2021-10-26 ENCOUNTER — Other Ambulatory Visit: Payer: Self-pay

## 2021-10-28 ENCOUNTER — Other Ambulatory Visit: Payer: Self-pay

## 2021-10-28 MED ORDER — LISINOPRIL 10 MG PO TABS
10.0000 mg | ORAL_TABLET | Freq: Every day | ORAL | 3 refills | Status: DC
  Filled 2021-10-28: qty 90, 90d supply, fill #0

## 2021-11-04 ENCOUNTER — Other Ambulatory Visit: Payer: Self-pay

## 2021-11-15 ENCOUNTER — Ambulatory Visit: Payer: Medicare Other | Admitting: Family Medicine

## 2021-11-18 ENCOUNTER — Other Ambulatory Visit: Payer: Self-pay

## 2021-11-18 DIAGNOSIS — N186 End stage renal disease: Secondary | ICD-10-CM

## 2021-11-29 ENCOUNTER — Encounter: Payer: Self-pay | Admitting: Surgery

## 2021-11-29 ENCOUNTER — Ambulatory Visit (INDEPENDENT_AMBULATORY_CARE_PROVIDER_SITE_OTHER): Payer: Medicare Other | Admitting: Surgery

## 2021-11-29 ENCOUNTER — Ambulatory Visit (HOSPITAL_COMMUNITY)
Admission: RE | Admit: 2021-11-29 | Discharge: 2021-11-29 | Disposition: A | Discharge status: Home / Self Care | Payer: Medicare Other | Source: Ambulatory Visit | Attending: Surgery | Admitting: Surgery

## 2021-11-29 VITALS — BP 154/87 | HR 63 | Temp 98.0°F | Resp 20 | Ht 74.0 in | Wt 217.0 lb

## 2021-11-29 DIAGNOSIS — N186 End stage renal disease: Secondary | ICD-10-CM | POA: Diagnosis present

## 2021-11-29 DIAGNOSIS — Z992 Dependence on renal dialysis: Secondary | ICD-10-CM

## 2021-11-29 NOTE — H&P (View-Only) (Signed)
[facility] [place]  Patient name: [name] Virts Sr. MRN: [id number] DOB: Jul 10, 1960 Sex: male   REASON FOR VISIT:    F/u access  HISOTRY OF PRESENT ILLNESS:    [name] Reicher Sr. is a 61 y.o. male who is status post right basilic vein transposition on 08/16/2021 by Dr. Stanford Breed.  He is still dialyzing through a catheter.  When they try to stick his fistula it causes him pain and they cannot get to needles to run simultaneously.  He states that he does have some numbness in his right hand.   PAST MEDICAL HISTORY:   Past Medical History:  Diagnosis Date   Anemia    Anxiety    Arthritis    CAD (coronary artery disease) 10/06/2017   CHF (congestive heart failure) (HCC)    CKD (chronic kidney disease) stage 5, GFR less than 15 ml/min (HCC)    dialysis T/Th/Sa   COPD (chronic obstructive pulmonary disease) ([place]) 09/29/2017   Cough    Depression    Dyspnea    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   ESRD (end stage renal disease) ([place])    TTHSAT  [name]   Heart murmur    Hypertension 09/29/2017   Immunosuppression due to drug therapy ([place]) 05/21/2019   On Rituximab for Fibrillary GN   Myelopathy ([place]) 09/14/2017   Seizures ([place])    06/29/21- "little ones at night."- managed by PCP   Shakes    Left arm post cervical neck surgery. Tremors whebn he moves it.   Tobacco abuse 09/29/2017   Type 2 diabetes mellitus ([place]) 10/17/2011   diet controlled - per pt does not check CBG at home- insurance did not approve - 08/13/21     FAMILY HISTORY:   Family History  Problem Relation Age of Onset   Diabetes Mother    Scoliosis Mother    Kidney cancer Father    Lung cancer Father    Hypertension Brother    Colon cancer Neg Hx    Pancreatic cancer Neg Hx    Prostate cancer Neg Hx    Rectal cancer Neg Hx    Stomach cancer Neg Hx     SOCIAL HISTORY:   Social History   Tobacco Use   Smoking status: Every Day    Packs/day:  1.50    Years: 42.00    Pack years: 63.00    Types: Cigarettes   Smokeless tobacco: Never   Tobacco comments:    1 pack a day   Substance Use Topics   Alcohol use: Not Currently    Alcohol/week: 10.0 standard drinks    Types: 10 Cans of beer per week    Comment: quit September 2022     ALLERGIES:   No Known Allergies   CURRENT MEDICATIONS:   Current Outpatient Medications  Medication Sig Dispense Refill   albuterol (VENTOLIN HFA) 108 (90 Base) MCG/ACT inhaler INHALE 2 PUFFS INTO THE LUNGS EVERY 6 (SIX) HOURS AS NEEDED FOR WHEEZING OR SHORTNESS OF BREATH. 18 g 6   AURYXIA 1 GM 210 MG(Fe) tablet Take 420 mg by mouth 2 (two) times daily before a meal.     ezetimibe (ZETIA) 10 MG tablet Take 1 tablet (10 mg total) by mouth daily. 90 tablet 3   fluticasone-salmeterol (ADVAIR) 250-50 MCG/ACT AEPB Inhale 1 puff into the lungs 2 (two) times daily. 60 each 6   furosemide (LASIX) 80 MG tablet Take 80 mg by mouth 4 (four) times a week. Take 80 mg  on Sun, Mon, Wed, and Fri). Skip doses on dialysis days (Tues, Thurs, and Sat)     furosemide (LASIX) 80 MG tablet Take 1 tablet by mouth once a day as directed on non-dialysis days 50 tablet 3   glucose blood (ACCU-CHEK GUIDE) test strip Use as instructed 3 times daily 100 each 12   hydrOXYzine (ATARAX) 25 MG tablet TAKE 1 TABLET (25 MG TOTAL) BY MOUTH 3 (THREE) TIMES DAILY AS NEEDED FOR ANXIETY. 90 tablet 3   levETIRAcetam (KEPPRA) 500 MG tablet Take 1 tablet (500 mg total) by mouth 2 (two) times daily. 180 tablet 1   lisinopril (ZESTRIL) 10 MG tablet Take 1 tablet by mouth once a day 90 tablet 3   lisinopril (ZESTRIL) 20 MG tablet Take 1 tablet by mouth once a day 90 tablet 3   lisinopril (ZESTRIL) 40 MG tablet TAKE 1 TABLET BY MOUTH DAILY 90 tablet 1   Misc. Devices MISC Blood pressure monitor.  Diagnosis-hypertension. 1 each 0   Polyethyl Glycol-Propyl Glycol (SYSTANE OP) Place 1 drop into both eyes daily as needed (dry eyes).      rosuvastatin (CRESTOR) 10 MG tablet Take 1 tablet (10 mg total) by mouth daily. 90 tablet 3   Accu-Chek Softclix Lancets lancets Use as instructed tid before meals (Patient not taking: Reported on 09/03/2021) 100 each 12   amLODipine (NORVASC) 10 MG tablet Take 1 tablet by mouth every night (Patient not taking: Reported on 08/03/2021) 90 tablet 3   Blood Glucose Monitoring Suppl (ACCU-CHEK GUIDE) w/Device KIT Use as directed tid (Patient not taking: Reported on 09/03/2021) 1 kit 0   buPROPion (WELLBUTRIN XL) 150 MG 24 hr tablet Take 1 tablet (150 mg total) by mouth daily. (Patient not taking: Reported on 08/03/2021) 30 tablet 2   citalopram (CELEXA) 40 MG tablet Take 1 tablet (40 mg total) by mouth daily. 90 tablet 1   cloNIDine (CATAPRES) 0.1 MG tablet Take 1 tablet by mouth twice a day (Patient not taking: Reported on 08/03/2021) 60 tablet 11   cloNIDine (CATAPRES) 0.2 MG tablet Take 1 tablet (0.2 mg total) by mouth 2 (two) times daily. Please keep 04-06-21 appointment for further refills. 2 nd attempt (Patient not taking: Reported on 08/13/2021) 180 tablet 3   lidocaine (LIDODERM) 5 % Place 1 patch onto the skin daily. Remove & Discard patch within 12 hours or as directed by MD (Patient not taking: Reported on 08/03/2021) 30 patch 3   oxyCODONE-acetaminophen (PERCOCET) 10-325 MG tablet Take 1 tablet by mouth every 6 (six) hours as needed for pain. (Patient not taking: Reported on 09/03/2021) 20 tablet 0   pregabalin (LYRICA) 25 MG capsule Take 1 capsule (25 mg total) by mouth at bedtime. (Patient not taking: Reported on 09/03/2021) 30 capsule 1   No current facility-administered medications for this visit.    REVIEW OF SYSTEMS:   '[X]'$  denotes positive finding, $RemoveBeforeDEI'[ ]'[id number]$  denotes negative finding Cardiac  Comments:  Chest pain or chest pressure:    Shortness of breath upon exertion:    Short of breath when lying flat:    Irregular heart rhythm:        Vascular    Pain in calf, thigh, or hip brought on by  ambulation:    Pain in feet at night that wakes you up from your sleep:     Blood clot in your veins:    Leg swelling:         Pulmonary    Oxygen at home:  Productive cough:     Wheezing:         Neurologic    Sudden weakness in arms or legs:     Sudden numbness in arms or legs:     Sudden onset of difficulty speaking or slurred speech:    Temporary loss of vision in one eye:     Problems with dizziness:         Gastrointestinal    Blood in stool:     Vomited blood:         Genitourinary    Burning when urinating:     Blood in urine:        Psychiatric    Major depression:         Hematologic    Bleeding problems:    Problems with blood clotting too easily:        Skin    Rashes or ulcers:        Constitutional    Fever or chills:      PHYSICAL EXAM:   Vitals:   11/29/21 1203  BP: (!) 154/87  Pulse: 63  Resp: 20  Temp: 98 F (36.7 C)  SpO2: 96%  Weight: 217 lb (98.4 kg)  Height: $Remove'6\' 2"'TceiXET$  (1.88 m)    GENERAL: The patient is a well-nourished male, in no acute distress. The vital signs are documented above. CARDIAC: There is a regular rate and rhythm.  VASCULAR: Palpable right radial pulse.  Palpable thrill within fistula although the vein feels somewhat small PULMONARY: Non-labored respirations MUSCULOSKELETAL: There are no major deformities or cyanosis. NEUROLOGIC: No focal weakness or paresthesias are detected. SKIN: There are no ulcers or rashes noted. PSYCHIATRIC: The patient has a normal affect.  STUDIES:   I have reviewed the following ultrasound:  Pressure increase from 146 mmHG to 180 mmHG in the right 2nd digit with  compression of right AV fistula.   MEDICAL ISSUES:   ESRD: I discussed with the patient that there are 2 separate issues going on.  The first is the fact that they are unable to cannulate his fistula.  In order to better determine the etiology for this, I think he needs to undergo a fistulogram.  The second is the pain and  numbness in his hand.  He does have a ultrasound today that shows a 34 mm gradient in his radial artery with and without fistula compression, suggestive of steal, however he does have a palpable radial pulse which is fairly strong.  I discussed with him that if his fistulogram is unremarkable, we could consider banding his fistula to see if this improves his hand symptoms, as he is not wanting to have the vein ligated.  I will try to get his fistulogram done on a nondialysis day.    Leia Alf, MD, FACS [facility] [phone] Pager [phone]

## 2021-11-29 NOTE — Progress Notes (Signed)
[facility] of [place]  Patient name: [name] Mcdiarmid Sr. MRN: [id number] DOB: 1960-07-31 Sex: male   REASON FOR VISIT:    F/u access  HISOTRY OF PRESENT ILLNESS:    [name] Busic Sr. is a 61 y.o. male who is status post right basilic vein transposition on 08/16/2021 by Dr. Lenell Antu.  He is still dialyzing through a catheter.  When they try to stick his fistula it causes him pain and they cannot get to needles to run simultaneously.  He states that he does have some numbness in his right hand.   PAST MEDICAL HISTORY:   Past Medical History:  Diagnosis Date   Anemia    Anxiety    Arthritis    CAD (coronary artery disease) 10/06/2017   CHF (congestive heart failure) (HCC)    CKD (chronic kidney disease) stage 5, GFR less than 15 ml/min (HCC)    dialysis T/Th/Sa   COPD (chronic obstructive pulmonary disease) (HCC) 09/29/2017   Cough    Depression    Dyspnea    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   ESRD (end stage renal disease) (HCC)    TTHSAT  [name] [name]   Heart murmur    Hypertension 09/29/2017   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   On Rituximab for Fibrillary GN   Myelopathy (HCC) 09/14/2017   Seizures (HCC)    06/29/21- "little ones at night."- managed by PCP   Shakes    Left arm post cervical neck surgery. Tremors whebn he moves it.   Tobacco abuse 09/29/2017   Type 2 diabetes mellitus (HCC) 10/17/2011   diet controlled - per pt does not check CBG at home- insurance did not approve - 08/13/21     FAMILY HISTORY:   Family History  Problem Relation Age of Onset   Diabetes Mother    Scoliosis Mother    Kidney cancer Father    Lung cancer Father    Hypertension Brother    Colon cancer Neg Hx    Pancreatic cancer Neg Hx    Prostate cancer Neg Hx    Rectal cancer Neg Hx    Stomach cancer Neg Hx     SOCIAL HISTORY:   Social History   Tobacco Use   Smoking status: Every Day    Packs/day:  1.50    Years: 42.00    Pack years: 63.00    Types: Cigarettes   Smokeless tobacco: Never   Tobacco comments:    1 pack a day   Substance Use Topics   Alcohol use: Not Currently    Alcohol/week: 10.0 standard drinks    Types: 10 Cans of beer per week    Comment: quit September 2022     ALLERGIES:   No Known Allergies   CURRENT MEDICATIONS:   Current Outpatient Medications  Medication Sig Dispense Refill   albuterol (VENTOLIN HFA) 108 (90 Base) MCG/ACT inhaler INHALE 2 PUFFS INTO THE LUNGS EVERY 6 (SIX) HOURS AS NEEDED FOR WHEEZING OR SHORTNESS OF BREATH. 18 g 6   AURYXIA 1 GM 210 MG(Fe) tablet Take 420 mg by mouth 2 (two) times daily before a meal.     ezetimibe (ZETIA) 10 MG tablet Take 1 tablet (10 mg total) by mouth daily. 90 tablet 3   fluticasone-salmeterol (ADVAIR) 250-50 MCG/ACT AEPB Inhale 1 puff into the lungs 2 (two) times daily. 60 each 6   furosemide (LASIX) 80 MG tablet Take 80 mg by mouth 4 (four) times a week. Take 80 mg  on Sun, Mon, Wed, and Fri). Skip doses on dialysis days (Tues, Thurs, and Sat)     furosemide (LASIX) 80 MG tablet Take 1 tablet by mouth once a day as directed on non-dialysis days 50 tablet 3   glucose blood (ACCU-CHEK GUIDE) test strip Use as instructed 3 times daily 100 each 12   hydrOXYzine (ATARAX) 25 MG tablet TAKE 1 TABLET (25 MG TOTAL) BY MOUTH 3 (THREE) TIMES DAILY AS NEEDED FOR ANXIETY. 90 tablet 3   levETIRAcetam (KEPPRA) 500 MG tablet Take 1 tablet (500 mg total) by mouth 2 (two) times daily. 180 tablet 1   lisinopril (ZESTRIL) 10 MG tablet Take 1 tablet by mouth once a day 90 tablet 3   lisinopril (ZESTRIL) 20 MG tablet Take 1 tablet by mouth once a day 90 tablet 3   lisinopril (ZESTRIL) 40 MG tablet TAKE 1 TABLET BY MOUTH DAILY 90 tablet 1   Misc. Devices MISC Blood pressure monitor.  Diagnosis-hypertension. 1 each 0   Polyethyl Glycol-Propyl Glycol (SYSTANE OP) Place 1 drop into both eyes daily as needed (dry eyes).      rosuvastatin (CRESTOR) 10 MG tablet Take 1 tablet (10 mg total) by mouth daily. 90 tablet 3   Accu-Chek Softclix Lancets lancets Use as instructed tid before meals (Patient not taking: Reported on 09/03/2021) 100 each 12   amLODipine (NORVASC) 10 MG tablet Take 1 tablet by mouth every night (Patient not taking: Reported on 08/03/2021) 90 tablet 3   Blood Glucose Monitoring Suppl (ACCU-CHEK GUIDE) w/Device KIT Use as directed tid (Patient not taking: Reported on 09/03/2021) 1 kit 0   buPROPion (WELLBUTRIN XL) 150 MG 24 hr tablet Take 1 tablet (150 mg total) by mouth daily. (Patient not taking: Reported on 08/03/2021) 30 tablet 2   citalopram (CELEXA) 40 MG tablet Take 1 tablet (40 mg total) by mouth daily. 90 tablet 1   cloNIDine (CATAPRES) 0.1 MG tablet Take 1 tablet by mouth twice a day (Patient not taking: Reported on 08/03/2021) 60 tablet 11   cloNIDine (CATAPRES) 0.2 MG tablet Take 1 tablet (0.2 mg total) by mouth 2 (two) times daily. Please keep 04-06-21 appointment for further refills. 2 nd attempt (Patient not taking: Reported on 08/13/2021) 180 tablet 3   lidocaine (LIDODERM) 5 % Place 1 patch onto the skin daily. Remove & Discard patch within 12 hours or as directed by MD (Patient not taking: Reported on 08/03/2021) 30 patch 3   oxyCODONE-acetaminophen (PERCOCET) 10-325 MG tablet Take 1 tablet by mouth every 6 (six) hours as needed for pain. (Patient not taking: Reported on 09/03/2021) 20 tablet 0   pregabalin (LYRICA) 25 MG capsule Take 1 capsule (25 mg total) by mouth at bedtime. (Patient not taking: Reported on 09/03/2021) 30 capsule 1   No current facility-administered medications for this visit.    REVIEW OF SYSTEMS:   '[X]'$  denotes positive finding, $RemoveBeforeDEI'[ ]'[id number]$  denotes negative finding Cardiac  Comments:  Chest pain or chest pressure:    Shortness of breath upon exertion:    Short of breath when lying flat:    Irregular heart rhythm:        Vascular    Pain in calf, thigh, or hip brought on by  ambulation:    Pain in feet at night that wakes you up from your sleep:     Blood clot in your veins:    Leg swelling:         Pulmonary    Oxygen at home:  Productive cough:     Wheezing:         Neurologic    Sudden weakness in arms or legs:     Sudden numbness in arms or legs:     Sudden onset of difficulty speaking or slurred speech:    Temporary loss of vision in one eye:     Problems with dizziness:         Gastrointestinal    Blood in stool:     Vomited blood:         Genitourinary    Burning when urinating:     Blood in urine:        Psychiatric    Major depression:         Hematologic    Bleeding problems:    Problems with blood clotting too easily:        Skin    Rashes or ulcers:        Constitutional    Fever or chills:      PHYSICAL EXAM:   Vitals:   11/29/21 1203  BP: (!) 154/87  Pulse: 63  Resp: 20  Temp: 98 F (36.7 C)  SpO2: 96%  Weight: 217 lb (98.4 kg)  Height: $Remove'6\' 2"'BWdiGaN$  (1.88 m)    GENERAL: The patient is a well-nourished male, in no acute distress. The vital signs are documented above. CARDIAC: There is a regular rate and rhythm.  VASCULAR: Palpable right radial pulse.  Palpable thrill within fistula although the vein feels somewhat small PULMONARY: Non-labored respirations MUSCULOSKELETAL: There are no major deformities or cyanosis. NEUROLOGIC: No focal weakness or paresthesias are detected. SKIN: There are no ulcers or rashes noted. PSYCHIATRIC: The patient has a normal affect.  STUDIES:   I have reviewed the following ultrasound:  Pressure increase from 146 mmHG to 180 mmHG in the right 2nd digit with  compression of right AV fistula.   MEDICAL ISSUES:   ESRD: I discussed with the patient that there are 2 separate issues going on.  The first is the fact that they are unable to cannulate his fistula.  In order to better determine the etiology for this, I think he needs to undergo a fistulogram.  The second is the pain and  numbness in his hand.  He does have a ultrasound today that shows a 34 mm gradient in his radial artery with and without fistula compression, suggestive of steal, however he does have a palpable radial pulse which is fairly strong.  I discussed with him that if his fistulogram is unremarkable, we could consider banding his fistula to see if this improves his hand symptoms, as he is not wanting to have the vein ligated.  I will try to get his fistulogram done on a nondialysis day.    Leia Alf, MD, FACS [facility] [phone] Pager [phone]

## 2021-12-06 ENCOUNTER — Encounter (HOSPITAL_COMMUNITY)
Admission: RE | Disposition: A | Discharge status: Home / Self Care | Payer: Self-pay | Source: Ambulatory Visit | Attending: Vascular Surgery

## 2021-12-06 ENCOUNTER — Encounter (HOSPITAL_COMMUNITY): Payer: Self-pay | Admitting: Vascular Surgery

## 2021-12-06 ENCOUNTER — Other Ambulatory Visit: Payer: Self-pay

## 2021-12-06 ENCOUNTER — Ambulatory Visit (HOSPITAL_COMMUNITY)
Admission: RE | Admit: 2021-12-06 | Discharge: 2021-12-06 | Disposition: A | Discharge status: Home / Self Care | Payer: Medicare Other | Source: Ambulatory Visit | Attending: Vascular Surgery | Admitting: Vascular Surgery

## 2021-12-06 DIAGNOSIS — N185 Chronic kidney disease, stage 5: Secondary | ICD-10-CM

## 2021-12-06 DIAGNOSIS — I509 Heart failure, unspecified: Secondary | ICD-10-CM | POA: Diagnosis not present

## 2021-12-06 DIAGNOSIS — Z992 Dependence on renal dialysis: Secondary | ICD-10-CM | POA: Insufficient documentation

## 2021-12-06 DIAGNOSIS — I132 Hypertensive heart and chronic kidney disease with heart failure and with stage 5 chronic kidney disease, or end stage renal disease: Secondary | ICD-10-CM | POA: Diagnosis not present

## 2021-12-06 DIAGNOSIS — N186 End stage renal disease: Secondary | ICD-10-CM | POA: Diagnosis not present

## 2021-12-06 DIAGNOSIS — E1122 Type 2 diabetes mellitus with diabetic chronic kidney disease: Secondary | ICD-10-CM | POA: Insufficient documentation

## 2021-12-06 DIAGNOSIS — T82898A Other specified complication of vascular prosthetic devices, implants and grafts, initial encounter: Secondary | ICD-10-CM

## 2021-12-06 DIAGNOSIS — Y841 Kidney dialysis as the cause of abnormal reaction of the patient, or of later complication, without mention of misadventure at the time of the procedure: Secondary | ICD-10-CM | POA: Insufficient documentation

## 2021-12-06 DIAGNOSIS — F17210 Nicotine dependence, cigarettes, uncomplicated: Secondary | ICD-10-CM | POA: Diagnosis not present

## 2021-12-06 HISTORY — PX: A/V FISTULAGRAM: CATH118298

## 2021-12-06 HISTORY — PX: PERIPHERAL VASCULAR BALLOON ANGIOPLASTY: CATH118281

## 2021-12-06 LAB — POCT I-STAT, CHEM 8
BUN: 28 mg/dL — ABNORMAL HIGH (ref 8–23)
Calcium, Ion: 1.1 mmol/L — ABNORMAL LOW (ref 1.15–1.40)
Chloride: 90 mmol/L — ABNORMAL LOW (ref 98–111)
Creatinine, Ser: 9.5 mg/dL — ABNORMAL HIGH (ref 0.61–1.24)
Glucose, Bld: 129 mg/dL — ABNORMAL HIGH (ref 70–99)
HCT: 34 % — ABNORMAL LOW (ref 39.0–52.0)
Hemoglobin: 11.6 g/dL — ABNORMAL LOW (ref 13.0–17.0)
Potassium: 4.3 mmol/L (ref 3.5–5.1)
Sodium: 131 mmol/L — ABNORMAL LOW (ref 135–145)
TCO2: 27 mmol/L (ref 22–32)

## 2021-12-06 SURGERY — A/V FISTULAGRAM
Anesthesia: LOCAL | Laterality: Right

## 2021-12-06 MED ORDER — FENTANYL CITRATE (PF) 100 MCG/2ML IJ SOLN
INTRAMUSCULAR | Status: DC | PRN
  Administered 2021-12-06: 50 ug via INTRAVENOUS

## 2021-12-06 MED ORDER — SODIUM CHLORIDE 0.9% FLUSH: 3.0000 mL | Freq: Two times a day (BID) | INTRAVENOUS | Status: DC

## 2021-12-06 MED ORDER — IODIXANOL 320 MG/ML IV SOLN
INTRAVENOUS | Status: DC | PRN
  Administered 2021-12-06: 55 mL

## 2021-12-06 MED ORDER — HEPARIN (PORCINE) IN NACL 1000-0.9 UT/500ML-% IV SOLN
INTRAVENOUS | Status: DC | PRN
  Administered 2021-12-06: 500 mL

## 2021-12-06 MED ORDER — MIDAZOLAM HCL 2 MG/2ML IJ SOLN
INTRAMUSCULAR | Status: DC | PRN
  Administered 2021-12-06: 1 mg via INTRAVENOUS

## 2021-12-06 MED ORDER — LIDOCAINE HCL (PF) 1 % IJ SOLN
INTRAMUSCULAR | Status: AC
  Filled 2021-12-06: qty 30

## 2021-12-06 MED ORDER — SODIUM CHLORIDE 0.9 % IV SOLN: 250.0000 mL | INTRAVENOUS | Status: DC | PRN

## 2021-12-06 MED ORDER — SODIUM CHLORIDE 0.9% FLUSH: 3.0000 mL | INTRAVENOUS | Status: DC | PRN

## 2021-12-06 MED ORDER — FENTANYL CITRATE (PF) 100 MCG/2ML IJ SOLN
INTRAMUSCULAR | Status: AC
  Filled 2021-12-06: qty 2

## 2021-12-06 MED ORDER — LIDOCAINE HCL (PF) 1 % IJ SOLN
INTRAMUSCULAR | Status: DC | PRN
  Administered 2021-12-06 (×2): 2 mL

## 2021-12-06 MED ORDER — HEPARIN (PORCINE) IN NACL 1000-0.9 UT/500ML-% IV SOLN
INTRAVENOUS | Status: AC
  Filled 2021-12-06: qty 500

## 2021-12-06 MED ORDER — MIDAZOLAM HCL 2 MG/2ML IJ SOLN
INTRAMUSCULAR | Status: AC
  Filled 2021-12-06: qty 2

## 2021-12-06 SURGICAL SUPPLY — 16 items
BAG SNAP BAND KOVER 36X36 (MISCELLANEOUS) ×2 IMPLANT
BALLN MUSTANG 4X60X75 (BALLOONS) ×2
BALLN MUSTANG 6.0X40 75 (BALLOONS) ×2
BALLOON MUSTANG 4X60X75 (BALLOONS) IMPLANT
BALLOON MUSTANG 6.0X40 75 (BALLOONS) IMPLANT
COVER DOME SNAP 22 D (MISCELLANEOUS) ×2 IMPLANT
KIT ENCORE 26 ADVANTAGE (KITS) ×1 IMPLANT
KIT MICROPUNCTURE NIT STIFF (SHEATH) ×1 IMPLANT
PROTECTION STATION PRESSURIZED (MISCELLANEOUS) ×2
SHEATH PINNACLE R/O II 6F 4CM (SHEATH) ×1 IMPLANT
SHEATH PROBE COVER 6X72 (BAG) ×2 IMPLANT
STATION PROTECTION PRESSURIZED (MISCELLANEOUS) ×1 IMPLANT
STOPCOCK MORSE 400PSI 3WAY (MISCELLANEOUS) ×2 IMPLANT
TRAY PV CATH (CUSTOM PROCEDURE TRAY) ×2 IMPLANT
TUBING CIL FLEX 10 FLL-RA (TUBING) ×2 IMPLANT
WIRE BENTSON .035X145CM (WIRE) ×1 IMPLANT

## 2021-12-06 NOTE — Interval H&P Note (Signed)
History and Physical Interval Note:  12/06/2021 9:07 AM  [name] Slade Sr.  has presented today for surgery, with the diagnosis of instage renal.  The various methods of treatment have been discussed with the patient and family. After consideration of risks, benefits and other options for treatment, the patient has consented to  Procedure(s): A/V Fistulagram (Right) as a surgical intervention.  The patient's history has been reviewed, patient examined, no change in status, stable for surgery.  I have reviewed the patient's chart and labs.  Questions were answered to the patient's satisfaction.     Servando Snare

## 2021-12-06 NOTE — Progress Notes (Signed)
Patient and mother was given discharge instructions. Both verbalized understanding. 

## 2021-12-06 NOTE — Op Note (Signed)
    Patient name: [name] [name]. MRN: [id number] DOB: 1960/11/27 Sex: male  12/06/2021 Pre-operative Diagnosis: End-stage renal disease Post-operative diagnosis:  Same Surgeon:  Eda Paschal. Donzetta Matters, MD Procedure Performed: 1.  Ultrasound guided cannulation right arm AV fistula in antegrade and retrograde directions 2.  Right upper extremity fistulogram 3.  Balloon angioplasty of proximal swing segment of basilic vein fistula with 4 and 6 mm balloons 4.  Moderate sedation with fentanyl and Versed for 20 minutes  Indications: 61 year old male with history of end-stage renal disease currently on dialysis via catheter.  He has a right arm AV fistula which has matured but he has various symptoms when he is on dialysis.  He is now indicated for fistulogram with possible invention.  Findings: Swing segment of the fistula appeared sclerotic and there was a valve just past this location.  We performed 4 mm balloon angioplasty and there was improved flow through that area and distal this we placed a 6 mm balloon there was smooth tapering after the 4 mm segment.  At completion there was a very strong thrill in the fistula.    If he has persistent symptoms he would probably need consideration of fistula ligation and to be catheter dependent.   Procedure:  The patient was identified in the holding area and taken to room 8.  The patient was then placed supine on the table and prepped and draped in the usual sterile fashion.  A time out was called.  Ultrasound was used to evaluate the right arm AV fistula.  There is no signs 1% lidocaine cannulated with micropuncture to followed by wire and sheath.  An image saved per record.  Performed upper extremity fistulogram.  With the above findings we then suture-ligated the cannulation site.  I used ultrasound and cannulated the fistula higher up in the arm in a retrograde fashion again using ultrasound guidance with micropuncture needle followed by wire and the sheath.   A Bentson wire was placed followed by a 6 [place] sheath.  I placed a Bentson wire into the ulnar artery perform retrograde angiography and then began with 4 mm balloon angioplasty of the anastomosis followed by 6 mm just central to this.  Completion demonstrated improved flow with improvement of the sclerotic segment.  With this we then remove the wire.  Sheath site was sutured ligated.  He tolerated procedure without any complication.  Contrast: 55 cc    Xiomara Sevillano C. Donzetta Matters, MD [facility] [place] Office: [phone] Pager: [phone]

## 2021-12-07 ENCOUNTER — Other Ambulatory Visit: Payer: Self-pay

## 2021-12-07 MED ORDER — LISINOPRIL 10 MG PO TABS
10.0000 mg | ORAL_TABLET | Freq: Every day | ORAL | 3 refills | Status: DC
  Filled 2021-12-07: qty 30, 30d supply, fill #0

## 2021-12-07 MED ORDER — FUROSEMIDE 80 MG PO TABS
ORAL_TABLET | ORAL | 3 refills | Status: DC
  Filled 2021-12-07: qty 30, 30d supply, fill #0

## 2021-12-13 ENCOUNTER — Other Ambulatory Visit: Payer: Self-pay

## 2022-02-03 ENCOUNTER — Ambulatory Visit: Payer: Medicare Other | Attending: Family Medicine | Admitting: Family Medicine

## 2022-02-03 ENCOUNTER — Other Ambulatory Visit: Payer: Self-pay

## 2022-02-03 ENCOUNTER — Other Ambulatory Visit: Payer: Self-pay | Admitting: Pharmacist

## 2022-02-03 ENCOUNTER — Encounter: Payer: Self-pay | Admitting: Family Medicine

## 2022-02-03 VITALS — BP 125/80 | HR 74 | Temp 98.0°F | Ht 74.0 in | Wt 215.2 lb

## 2022-02-03 DIAGNOSIS — J438 Other emphysema: Secondary | ICD-10-CM | POA: Diagnosis not present

## 2022-02-03 DIAGNOSIS — R569 Unspecified convulsions: Secondary | ICD-10-CM | POA: Diagnosis not present

## 2022-02-03 DIAGNOSIS — F419 Anxiety disorder, unspecified: Secondary | ICD-10-CM | POA: Diagnosis not present

## 2022-02-03 DIAGNOSIS — F17210 Nicotine dependence, cigarettes, uncomplicated: Secondary | ICD-10-CM | POA: Diagnosis not present

## 2022-02-03 DIAGNOSIS — Z23 Encounter for immunization: Secondary | ICD-10-CM

## 2022-02-03 DIAGNOSIS — Z992 Dependence on renal dialysis: Secondary | ICD-10-CM | POA: Diagnosis not present

## 2022-02-03 DIAGNOSIS — I2510 Atherosclerotic heart disease of native coronary artery without angina pectoris: Secondary | ICD-10-CM | POA: Diagnosis not present

## 2022-02-03 DIAGNOSIS — N186 End stage renal disease: Secondary | ICD-10-CM | POA: Diagnosis not present

## 2022-02-03 DIAGNOSIS — Z981 Arthrodesis status: Secondary | ICD-10-CM | POA: Diagnosis not present

## 2022-02-03 DIAGNOSIS — E1122 Type 2 diabetes mellitus with diabetic chronic kidney disease: Secondary | ICD-10-CM

## 2022-02-03 DIAGNOSIS — J439 Emphysema, unspecified: Secondary | ICD-10-CM | POA: Diagnosis not present

## 2022-02-03 DIAGNOSIS — G629 Polyneuropathy, unspecified: Secondary | ICD-10-CM

## 2022-02-03 DIAGNOSIS — Z79899 Other long term (current) drug therapy: Secondary | ICD-10-CM | POA: Diagnosis not present

## 2022-02-03 DIAGNOSIS — E1140 Type 2 diabetes mellitus with diabetic neuropathy, unspecified: Secondary | ICD-10-CM | POA: Diagnosis not present

## 2022-02-03 DIAGNOSIS — F32A Depression, unspecified: Secondary | ICD-10-CM | POA: Diagnosis not present

## 2022-02-03 LAB — POCT GLYCOSYLATED HEMOGLOBIN (HGB A1C): HbA1c, POC (controlled diabetic range): 6.6 % (ref 0.0–7.0)

## 2022-02-03 LAB — GLUCOSE, POCT (MANUAL RESULT ENTRY): POC Glucose: 164 mg/dl — AB (ref 70–99)

## 2022-02-03 MED ORDER — PREGABALIN 25 MG PO CAPS
25.0000 mg | ORAL_CAPSULE | Freq: Every day | ORAL | 1 refills | Status: DC
  Filled 2022-02-03: qty 30, 30d supply, fill #0
  Filled 2022-05-31: qty 30, 30d supply, fill #1

## 2022-02-03 MED ORDER — CITALOPRAM HYDROBROMIDE 40 MG PO TABS
40.0000 mg | ORAL_TABLET | Freq: Every day | ORAL | 1 refills | Status: DC
  Filled 2022-02-03: qty 90, 90d supply, fill #0
  Filled 2022-05-31: qty 90, 90d supply, fill #1

## 2022-02-03 MED ORDER — FLUTICASONE-SALMETEROL 250-50 MCG/ACT IN AEPB
1.0000 | INHALATION_SPRAY | Freq: Two times a day (BID) | RESPIRATORY_TRACT | 6 refills | Status: DC
  Filled 2022-02-03: qty 60, 30d supply, fill #0
  Filled 2022-05-31: qty 60, 30d supply, fill #1
  Filled 2022-08-03 – 2022-12-02 (×2): qty 60, 30d supply, fill #2

## 2022-02-03 MED ORDER — ACCU-CHEK GUIDE VI STRP: ORAL_STRIP | 12 refills | Status: DC

## 2022-02-03 MED ORDER — ACCU-CHEK SOFTCLIX LANCETS MISC: 2 refills | Status: DC

## 2022-02-03 MED ORDER — HYDROXYZINE HCL 25 MG PO TABS
ORAL_TABLET | Freq: Three times a day (TID) | ORAL | 3 refills | Status: DC | PRN
  Filled 2022-02-03: qty 90, 30d supply, fill #0
  Filled 2022-05-31: qty 90, 30d supply, fill #1
  Filled 2022-07-28: qty 90, 30d supply, fill #2

## 2022-02-03 MED ORDER — FUROSEMIDE 80 MG PO TABS
80.0000 mg | ORAL_TABLET | ORAL | 1 refills | Status: DC
  Filled 2022-02-03: qty 48, 84d supply, fill #0
  Filled 2022-05-31: qty 48, 84d supply, fill #1

## 2022-02-03 MED ORDER — LEVETIRACETAM 500 MG PO TABS
500.0000 mg | ORAL_TABLET | Freq: Two times a day (BID) | ORAL | 1 refills | Status: DC
  Filled 2022-02-03: qty 180, 90d supply, fill #0
  Filled 2022-05-31: qty 180, 90d supply, fill #1

## 2022-02-03 MED ORDER — ACCU-CHEK GUIDE W/DEVICE KIT
PACK | 0 refills | Status: DC
Start: 2022-02-03 — End: 2022-02-03
  Filled 2022-02-03: qty 1, 30d supply, fill #0

## 2022-02-03 MED ORDER — ACCU-CHEK GUIDE W/DEVICE KIT: PACK | 0 refills | Status: DC

## 2022-02-03 MED ORDER — ZOSTER VAC RECOMB ADJUVANTED 50 MCG/0.5ML IM SUSR: 0.5000 mL | Freq: Once | INTRAMUSCULAR | 1 refills | Status: AC

## 2022-02-03 MED ORDER — ACCU-CHEK GUIDE VI STRP
ORAL_STRIP | 12 refills | Status: DC
Start: 2022-02-03 — End: 2022-02-03
  Filled 2022-02-03: qty 100, 34d supply, fill #0

## 2022-02-03 NOTE — Progress Notes (Signed)
Subjective:  Patient ID: [name] Plan Sr., male    DOB: 03/21/1961  Age: 61 y.o. MRN: [id number]  CC: Diabetes   HPI [name] Dupuy Sr. is a 61 y.o. year old male with a history of type 2 diabetes mellitus (diet-controlled A1c 6.6), Seizures, ESRD (on HD T, Th, Sat) Fibrillary glomerulonephritis,, alcohol abuse, hypertension, alcohol abuse, coronary artery calcifications, ACDF of C4,5 and C5,6 on 09/14/17  By Dr Wynetta [name] secondary to Myelopathy seen for a follow-up visit.  Interval History: He has a piece of wood stuck in the dorsum of his left index finger which has been present x 31yrs and sometimes hurts when he flexes his fingers.  HD sessions are going well but he feels worn out after. He endorses feeling 'off balance' on some occasions.  Blood pressure is 125/80 and he states his clonidine was discontinued by his nephrologist.  He is unsure what his blood pressure reads when he goes for hemodialysis.  He never picked up testing supplies from the Pharmacy and has not been checking his sugars Denies hypoglycemic adverse effects. 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 Tremors whebn he moves it.   Tobacco abuse 09/29/2017   Type 2 diabetes mellitus (HCC) 10/17/2011   diet controlled - per pt does not check CBG  at home- insurance did not approve - 08/13/21    Past Surgical History:  Procedure Laterality Date   A/V FISTULAGRAM Right 12/06/2021   Procedure: A/V Fistulagram;  Surgeon: Maeola Harman, MD;  Location: [facility] INVASIVE CV LAB;  Service: Cardiovascular;  Laterality: Right;   ANTERIOR CERVICAL DECOMP/DISCECTOMY FUSION N/A 09/14/2017   Procedure: ANTERIOR CERVICAL DECOMPRESSION/DISCECTOMY FUSIONCERVICAL 4- CERVICAL 5, CERVICAL 5- CERVICAL 6;  Surgeon: Donalee Citrin, MD;  Location: [facility] OR;  Service: Neurosurgery;  Laterality: N/A;  ANTERIOR CERVICAL DECOMPRESSION/DISCECTOMY FUSIONCERVICAL 4- CERVICAL 5, CERVICAL 5- CERVICAL 6   AV FISTULA PLACEMENT Left 03/05/2021   Procedure: LEFT UPPER EXTREMITY RADIOBASILIC  ARTERIOVENOUS (AV) FISTULA CREATION;  Surgeon: Leonie Douglas, MD;  Location: [facility] OR;  Service: Vascular;  Laterality: Left;  PERIPHERAL NERVE BLOCK   AV FISTULA PLACEMENT Left 04/21/2021   Procedure: INSERTION OF  LEFT ARM ARTERIOVENOUS (AV) GORE-TEX GRAFT;  Surgeon: Leonie Douglas, MD;  Location: [facility] OR;  Service: Vascular;  Laterality: Left;  PERIPHERAL NERVE BLOCK   AV FISTULA PLACEMENT Right 06/30/2021   Procedure: RIGHT ARM ARTERIOVENOUS (AV) FISTULA;  Surgeon: Leonie Douglas, MD;  Location: [facility] OR;  Service: Vascular;  Laterality: Right;  PERIPHERAL NERVE BLOCK   BASCILIC VEIN TRANSPOSITION Right 08/16/2021   Procedure: RIGHT ARM SECOND STAGE BASILIC VEIN TRANSPOSITION;  Surgeon: Leonie Douglas, MD;  Location: [facility]  OR;  Service: Vascular;  Laterality: Right;  PERIPHERAL NERVE BLOCK, LOCAL GIVEN   IR FLUORO GUIDE CV LINE LEFT  03/25/2021   PERIPHERAL VASCULAR BALLOON ANGIOPLASTY Right 12/06/2021   Procedure: PERIPHERAL VASCULAR BALLOON ANGIOPLASTY;  Surgeon: Waynetta Sandy, MD;  Location: [place] CV LAB;  Service: Cardiovascular;  Laterality: Right;   UPPER EXTREMITY VENOGRAPHY Bilateral 06/04/2021   Procedure: CENTRAL  AND UPPER VENOGRAPHY;  Surgeon: [name], MD;   Location: [place] CV LAB;  Service: Cardiovascular;  Laterality: Bilateral;    Family History  Problem Relation Age of Onset   Diabetes Mother    Scoliosis Mother    Kidney cancer Father    Lung cancer Father    Hypertension Brother    Colon cancer Neg Hx    Pancreatic cancer Neg Hx    Prostate cancer Neg Hx    Rectal cancer Neg Hx    Stomach cancer Neg Hx     Social History   Socioeconomic History   Marital status: Single    Spouse name: Not on file   Number of children: Not on file   Years of education: Not on file   Highest education level: Not on file  Occupational History   Not on file  Tobacco Use   Smoking status: Every Day    Packs/day: 1.50    Years: 42.00    Total pack years: 63.00    Types: Cigarettes   Smokeless tobacco: Never   Tobacco comments:    1 pack a day   Vaping Use   Vaping Use: Never used  Substance and Sexual Activity   Alcohol use: Not Currently    Alcohol/week: 10.0 standard drinks of alcohol    Types: 10 Cans of beer per week    Comment: quit September 2022   Drug use: Not Currently    Types: Marijuana    Comment: ocassionally   Sexual activity: Not on file  Other Topics Concern   Not on file  Social History Narrative   Not on file   Social Determinants of Health   Financial Resource Strain: Low Risk  (09/03/2021)   Overall Financial Resource Strain (CARDIA)    Difficulty of Paying Living Expenses: Not hard at all  Food Insecurity: No Food Insecurity (09/03/2021)   Hunger Vital Sign    Worried About Running Out of Food in the Last Year: Never true    Ran Out of Food in the Last Year: Never true  Transportation Needs: No Transportation Needs (09/03/2021)   PRAPARE - Hydrologist (Medical): No    Lack of Transportation (Non-Medical): No  Physical Activity: Not on file  Stress: No Stress Concern Present (09/03/2021)   Bartholomew     Feeling of Stress : Only a little  Social Connections: Moderately Isolated (09/03/2021)   Social Connection and Isolation Panel [NHANES]    Frequency of Communication with Friends and Family: More than three times a week    Frequency of Social Gatherings with Friends and Family: More than three times a week    Attends Religious Services: 1 to 4 times per year    Active Member of [employer] or Organizations: No    Attends Archivist Meetings: Never    Marital Status: Divorced    No Known Allergies  Outpatient Medications Prior to Visit  Medication Sig Dispense Refill   Accu-Chek Softclix Lancets lancets Use as instructed tid  before meals 100 each 12   albuterol (VENTOLIN HFA) 108 (90 Base) MCG/ACT inhaler INHALE 2 PUFFS INTO THE LUNGS EVERY 6 (SIX) HOURS AS NEEDED FOR WHEEZING OR SHORTNESS OF BREATH. 18 g 6   amLODipine (NORVASC) 10 MG tablet Take 1 tablet by mouth every night 90 tablet 3   AURYXIA 1 GM 210 MG(Fe) tablet Take 420 mg by mouth 2 (two) times daily before a meal.     ezetimibe (ZETIA) 10 MG tablet Take 1 tablet (10 mg total) by mouth daily. 90 tablet 3   fluticasone-salmeterol (ADVAIR) 250-50 MCG/ACT AEPB Inhale 1 puff into the lungs 2 (two) times daily. 60 each 6   furosemide (LASIX) 80 MG tablet Take 80 mg by mouth 4 (four) times a week. Take 80 mg on Sun, Mon, Wed, and Fri). Skip doses on dialysis days (Tues, Thurs, and Sat)     hydrOXYzine (ATARAX) 25 MG tablet TAKE 1 TABLET (25 MG TOTAL) BY MOUTH 3 (THREE) TIMES DAILY AS NEEDED FOR ANXIETY. 90 tablet 3   lisinopril (ZESTRIL) 40 MG tablet TAKE 1 TABLET BY MOUTH DAILY 90 tablet 1   Misc. Devices MISC Blood pressure monitor.  Diagnosis-hypertension. 1 each 0   Polyethyl Glycol-Propyl Glycol (SYSTANE OP) Place 1 drop into both eyes daily as needed (dry eyes).     pregabalin (LYRICA) 25 MG capsule Take 1 capsule (25 mg total) by mouth at bedtime. 30 capsule 1   rosuvastatin (CRESTOR) 10 MG tablet Take 1 tablet (10 mg total)  by mouth daily. 90 tablet 3   Blood Glucose Monitoring Suppl (ACCU-CHEK GUIDE) w/Device KIT Use as directed tid (Patient not taking: Reported on 09/03/2021) 1 kit 0   buPROPion (WELLBUTRIN XL) 150 MG 24 hr tablet Take 1 tablet (150 mg total) by mouth daily. (Patient not taking: Reported on 08/03/2021) 30 tablet 2   citalopram (CELEXA) 40 MG tablet Take 1 tablet (40 mg total) by mouth daily. 90 tablet 1   cloNIDine (CATAPRES) 0.1 MG tablet Take 1 tablet by mouth twice a day (Patient not taking: Reported on 08/03/2021) 60 tablet 11   glucose blood (ACCU-CHEK GUIDE) test strip Use as instructed 3 times daily (Patient not taking: Reported on 02/03/2022) 100 each 12   levETIRAcetam (KEPPRA) 500 MG tablet Take 1 tablet (500 mg total) by mouth 2 (two) times daily. 180 tablet 1   lidocaine (LIDODERM) 5 % Place 1 patch onto the skin daily. Remove & Discard patch within 12 hours or as directed by MD (Patient not taking: Reported on 08/03/2021) 30 patch 3   oxyCODONE-acetaminophen (PERCOCET) 10-325 MG tablet Take 1 tablet by mouth every 6 (six) hours as needed for pain. (Patient not taking: Reported on 09/03/2021) 20 tablet 0   cloNIDine (CATAPRES) 0.2 MG tablet Take 1 tablet (0.2 mg total) by mouth 2 (two) times daily. Please keep 04-06-21 appointment for further refills. 2 nd attempt (Patient not taking: Reported on 08/13/2021) 180 tablet 3   furosemide (LASIX) 80 MG tablet Take 1 tablet by mouth once a day as directed on non-dialysis days 50 tablet 3   furosemide (LASIX) 80 MG tablet Take 1 tablet by mouth once a day as directed on non-dialysis days 52 tablet 3   lisinopril (ZESTRIL) 10 MG tablet Take 1 tablet by mouth once a day 90 tablet 3   lisinopril (ZESTRIL) 10 MG tablet Take 1 tablet by mouth once a day 90 tablet 3   lisinopril (ZESTRIL) 20 MG tablet Take 1 tablet by mouth  once a day 90 tablet 3   No facility-administered medications prior to visit.     ROS Review of Systems  Constitutional:  Negative for  activity change and appetite change.  HENT:  Negative for sinus pressure and sore throat.   Respiratory:  Negative for chest tightness, shortness of breath and wheezing.   Cardiovascular:  Negative for chest pain and palpitations.  Gastrointestinal:  Negative for abdominal distention, abdominal pain and constipation.  Genitourinary: Negative.   Musculoskeletal: Negative.   Psychiatric/Behavioral:  Negative for behavioral problems and dysphoric mood.     Objective:  BP 125/80   Pulse 74   Temp 98 F (36.7 C) (Oral)   Ht $R'6\' 2"'Iw$  (1.88 m)   Wt 215 lb 3.2 oz (97.6 kg)   SpO2 97%   BMI 27.63 kg/m      02/03/2022    8:44 AM 12/06/2021   11:07 AM 12/06/2021   11:02 AM  BP/Weight  Systolic BP 882 800 349  Diastolic BP 80 77 82  Wt. (Lbs) 215.2    BMI 27.63 kg/m2        Physical Exam Constitutional:      Appearance: He is well-developed.  Cardiovascular:     Rate and Rhythm: Normal rate.     Heart sounds: Normal heart sounds. No murmur heard. Pulmonary:     Effort: Pulmonary effort is normal.     Breath sounds: Normal breath sounds. No wheezing or rales.  Chest:     Chest wall: No tenderness.  Abdominal:     General: Bowel sounds are normal. There is no distension.     Palpations: Abdomen is soft. There is no mass.     Tenderness: There is no abdominal tenderness.  Musculoskeletal:        General: Normal range of motion.     Right lower leg: No edema.     Left lower leg: No edema.     Comments: Right  arm AV fistula  Neurological:     Mental Status: He is alert and oriented to person, place, and time.  Psychiatric:        Mood and Affect: Mood normal.        Latest Ref Rng & Units 12/06/2021    9:58 AM 08/16/2021    6:21 AM 06/30/2021    6:56 AM  CMP  Glucose 70 - 99 mg/dL 129  139  127   BUN 8 - 23 mg/dL 28  35  30   Creatinine 0.61 - 1.24 mg/dL 9.50  9.00  7.10   Sodium 135 - 145 mmol/L 131  131  137   Potassium 3.5 - 5.1 mmol/L 4.3  4.5  4.9   Chloride 98 -  111 mmol/L 90  94  102     Lipid Panel     Component Value Date/Time   CHOL 169 02/17/2021 0924   TRIG 115 02/17/2021 0924   HDL 47 02/17/2021 0924   CHOLHDL 3.6 02/17/2021 0924   LDLCALC 101 (H) 02/17/2021 0924    CBC    Component Value Date/Time   WBC 11.8 (H) 03/25/2021 0830   RBC 4.03 (L) 03/25/2021 0830   HGB 11.6 (L) 12/06/2021 0958   HCT 34.0 (L) 12/06/2021 0958   PLT 354 03/25/2021 0830   MCV 92.1 03/25/2021 0830   MCH 30.3 03/25/2021 0830   MCHC 32.9 03/25/2021 0830   RDW 12.7 03/25/2021 0830   LYMPHSABS 1.3 03/25/2021 0830   MONOABS 1.1 (H) 03/25/2021 0830  EOSABS 0.5 03/25/2021 0830   BASOSABS 0.1 03/25/2021 0830    Lab Results  Component Value Date   HGBA1C 6.6 02/03/2022    Assessment & Plan:  1. Type 2 diabetes mellitus with chronic kidney disease on chronic dialysis, without long-term current use of insulin (HCC) Controlled with A1c of 6.6 Continue current regimen Counseled on Diabetic diet, my plate method, 168 minutes of moderate intensity exercise/week Blood sugar logs with fasting goals of 80-120 mg/dl, random of less than 180 and in the event of sugars less than 60 mg/dl or greater than 400 mg/dl encouraged to notify the clinic. Advised on the need for annual eye exams, annual foot exams, Pneumonia vaccine. - POCT glucose (manual entry) - POCT glycosylated hemoglobin (Hb A1C)  2. Anxiety and depression Controlled - citalopram (CELEXA) 40 MG tablet; Take 1 tablet (40 mg total) by mouth daily.  Dispense: 90 tablet; Refill: 1 - hydrOXYzine (ATARAX) 25 MG tablet; TAKE 1 TABLET (25 MG TOTAL) BY MOUTH 3 (THREE) TIMES DAILY AS NEEDED FOR ANXIETY.  Dispense: 90 tablet; Refill: 3  3. Other emphysema (HCC) Stable - fluticasone-salmeterol (ADVAIR) 250-50 MCG/ACT AEPB; Inhale 1 puff into the lungs 2 (two) times daily.  Dispense: 60 each; Refill: 6  4. Seizure ([place]) Controlled - levETIRAcetam (KEPPRA) 500 MG tablet; Take 1 tablet (500 mg total) by  mouth 2 (two) times daily.  Dispense: 180 tablet; Refill: 1  5. Smoking greater than 30 pack years Previously on Wellbutrin but could not tolerate it - CT CHEST LUNG CANCER SCREENING LOW DOSE WO CONTRAST; Future  6. Need for shingles vaccine - Zoster Vaccine Adjuvanted [facility]) injection; Inject 0.5 mLs into the muscle once for 1 dose.  Dispense: 0.5 mL; Refill: 1  7. Neuropathy Controlled He does have foreign body in his left finger which has been present for 6 years Now will not be a good time to have this surgically removed - pregabalin (LYRICA) 25 MG capsule; Take 1 capsule (25 mg total) by mouth at bedtime.  Dispense: 30 capsule; Refill: 1   No orders of the defined types were placed in this encounter.   Follow-up: Return in about 6 months (around 08/06/2022) for Chronic medical conditions.       Charlott Rakes, MD, FAAFP. [facility] and [place], [place]   02/03/2022, 8:53 AM

## 2022-02-16 ENCOUNTER — Telehealth: Payer: Self-pay | Admitting: *Deleted

## 2022-02-16 NOTE — Patient Outreach (Signed)
  Care Coordination   02/16/2022 Name: [name] Kiper Sr. MRN: [id number] DOB: [birth year]   Care Coordination Outreach Attempts:  An unsuccessful telephone outreach was attempted today to offer the patient information about available care coordination services as a benefit of their health plan.   Follow Up Plan:  Additional outreach attempts will be made to offer the patient care coordination information and services.   Encounter Outcome:  No Answer  Care Coordination Interventions Activated:  No   Care Coordination Interventions:  No, not indicated    [name] Loron RN, BSN [place] [phone] [name]

## 2022-02-21 ENCOUNTER — Ambulatory Visit: Payer: Self-pay

## 2022-02-21 NOTE — Patient Outreach (Signed)
  Care Coordination   Initial Visit Note   02/21/2022 Name: [name] Dralle Sr. MRN: [id number] DOB: 10-20-1960  [name] Slade Sr. is a 61 y.o. year old male who sees Charlott Rakes, MD for primary care. I spoke with  [name] Slade Sr. by phone today.  What matters to the patients health and wellness today?  To continue participating with dialysis   Goals Addressed             This Visit's Progress    COMPLETED: Care Coordination Activities       Care Coordination Interventions: SDoH screening performed - no acute resource needs identified at this time Determined the patient does not have concerns with medication costs and/or adherence at this time Encouraged the patient to speak with his provider if patient assistance is needed in the future regarding medication costs Reviewed the role of the care coordination team - no follow up desired at this time Discussed the patient is active with dialysis and has a Education officer, museum and nurse to assist as needed Instructed the patient to contact his primary care provider as needed        SDOH assessments and interventions completed:  Yes  SDOH Interventions Today    Flowsheet Row Most Recent Value  SDOH Interventions   Food Insecurity Interventions Intervention Not Indicated  Housing Interventions Intervention Not Indicated  Transportation Interventions Intervention Not Indicated        Care Coordination Interventions Activated:  Yes  Care Coordination Interventions:  Yes, provided   Follow up plan: No further intervention required.   Encounter Outcome:  Pt. Visit Completed   Daneen Schick, BSW, CDP Social Worker, Certified Dementia Practitioner Care Coordination [phone]

## 2022-02-21 NOTE — Patient Instructions (Signed)
Visit Information  Thank you for taking time to visit with me today. Please don't hesitate to contact me if I can be of assistance to you.   Following are the goals we discussed today:   Goals Addressed             This Visit's Progress    COMPLETED: Care Coordination Activities       Care Coordination Interventions: SDoH screening performed - no acute resource needs identified at this time Determined the patient does not have concerns with medication costs and/or adherence at this time Encouraged the patient to speak with his provider if patient assistance is needed in the future regarding medication costs Reviewed the role of the care coordination team - no follow up desired at this time Discussed the patient is active with dialysis and has a Education officer, museum and nurse to assist as needed Instructed the patient to contact his primary care provider as needed        Please call the care guide team at [phone] if you need to schedule an appointment with me  If you are experiencing a Mental Health or [place] or need someone to talk to, please call [phone] (toll free, 24 hour hotline)  Patient verbalizes understanding of instructions and care plan provided today and agrees to view in [place]. Active MyChart status and patient understanding of how to access instructions and care plan via MyChart confirmed with patient.     No further follow up required: Please contact your primary care provider as needed.  Daneen Schick, BSW, CDP Social Worker, Certified Dementia Practitioner Care Coordination [phone]

## 2022-04-16 IMAGING — CT CT CHEST LUNG CANCER SCREENING LOW DOSE W/O CM
2 of 3 series · 15 of 36 positions shown, 18 images · non-contrast
Comparison: 09/09/2017

CLINICAL DATA: Lung cancer screening. Fifty-five pack-year history.
Current asymptomatic smoker.

EXAM:
CT CHEST WITHOUT CONTRAST LOW-DOSE FOR LUNG CANCER SCREENING
TECHNIQUE: Multidetector CT imaging of the chest was performed following the
standard protocol without IV contrast.

[Series 2: axial st · axial · 0.77mm/px · z∈[+1482,+1722]mm · 12 of 58 slices shown, 15 images]
[im 5/58  mediastinal]
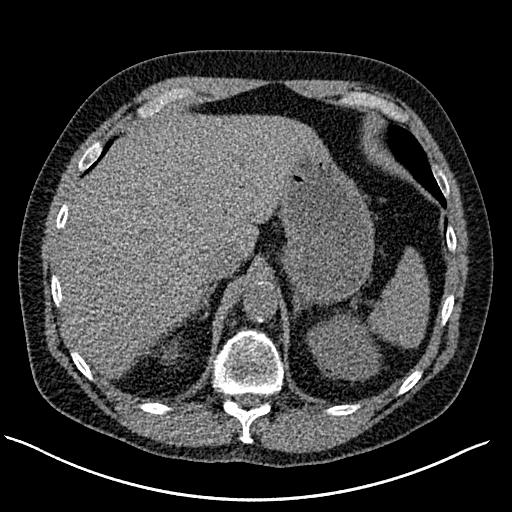
[im 5/58  lung]
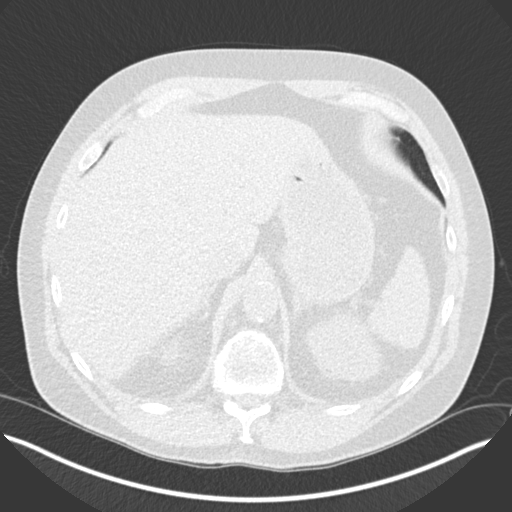
[im 9/58  lung]
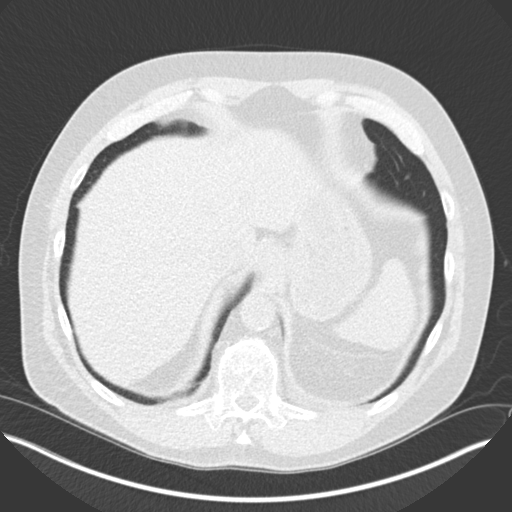
[im 13/58  lung]
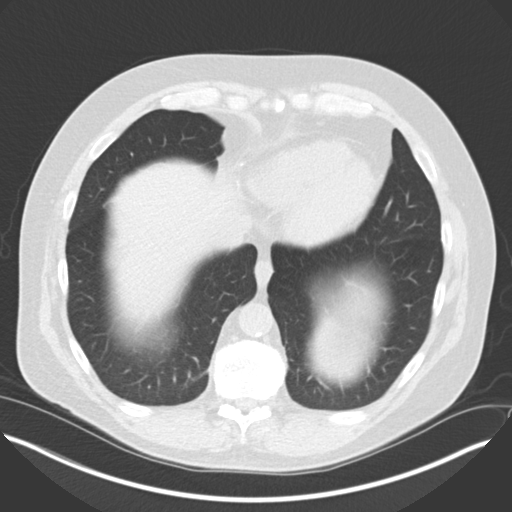
[im 17/58  lung]
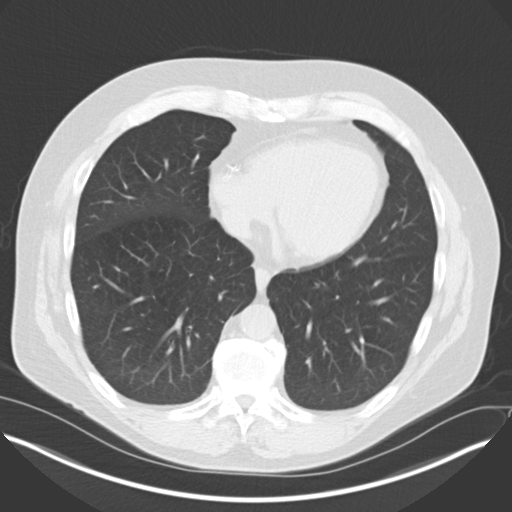
[im 22/58  mediastinal]
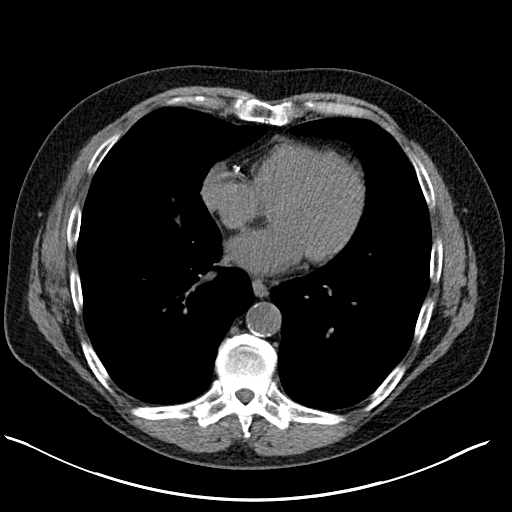
[im 22/58  lung]
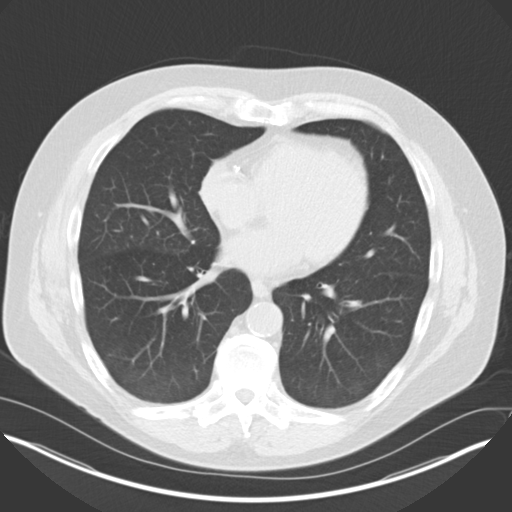
[im 26/58  lung]
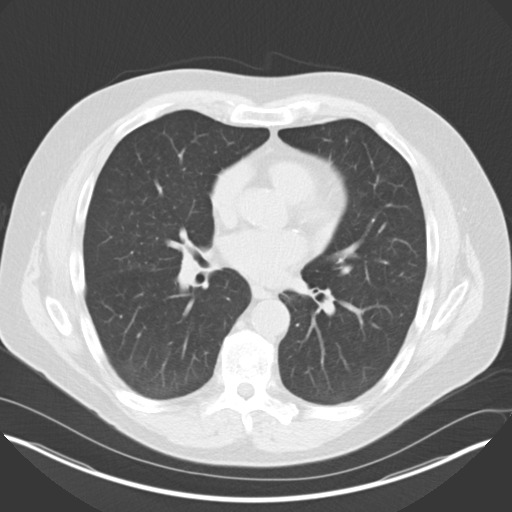
[im 32/58  lung]
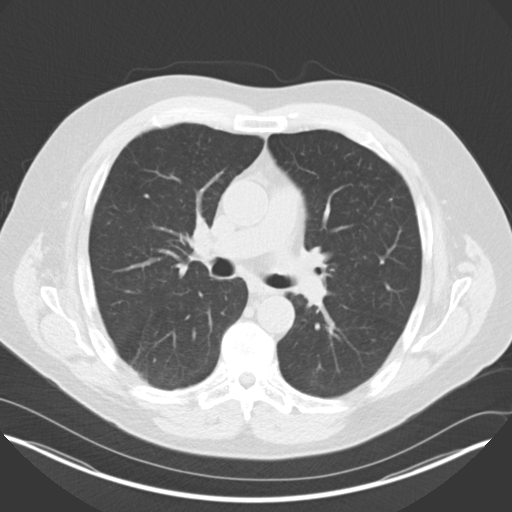
[im 36/58  lung]
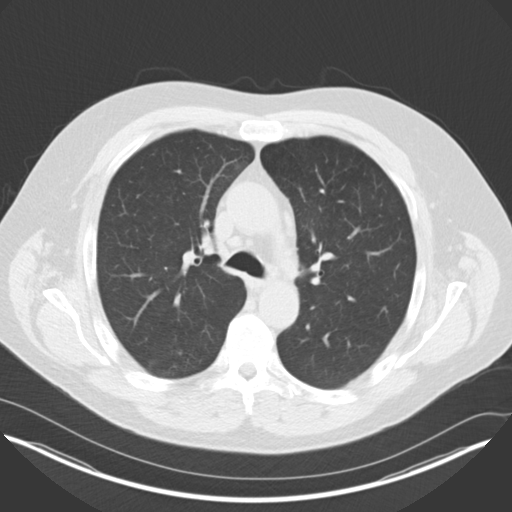
[im 41/58  mediastinal]
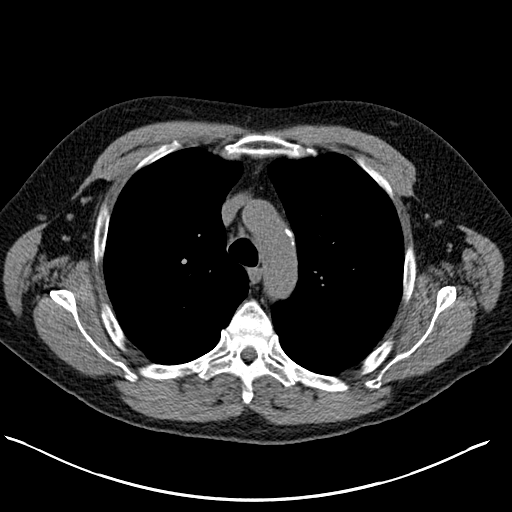
[im 41/58  lung]
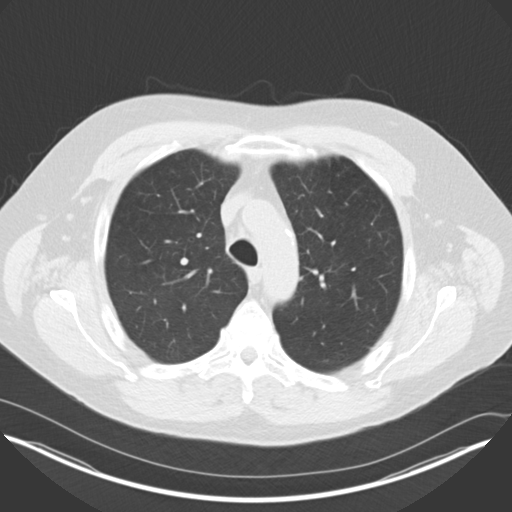
[im 45/58  lung]
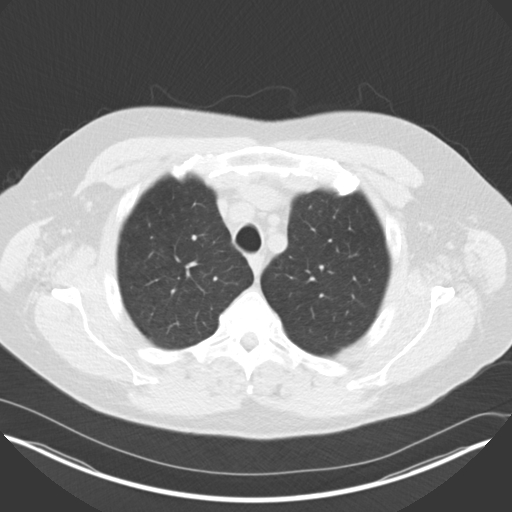
[im 49/58  lung]
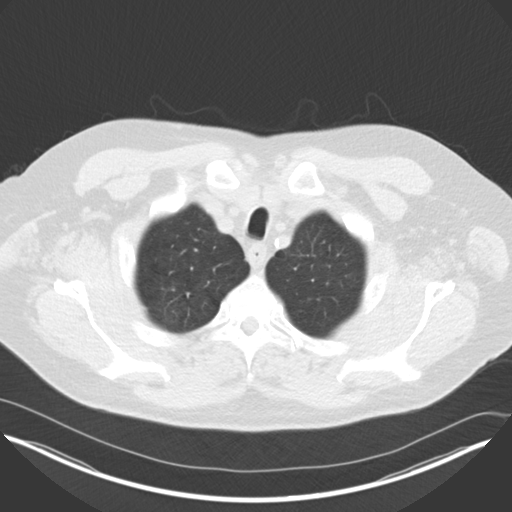
[im 53/58  lung]
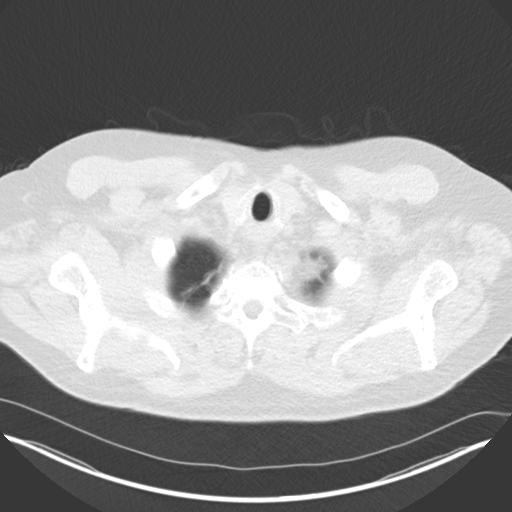

[Series 5: coronal · coronal · 0.59mm/px · 3 of 307 slices shown]
[im 62/307  lung]
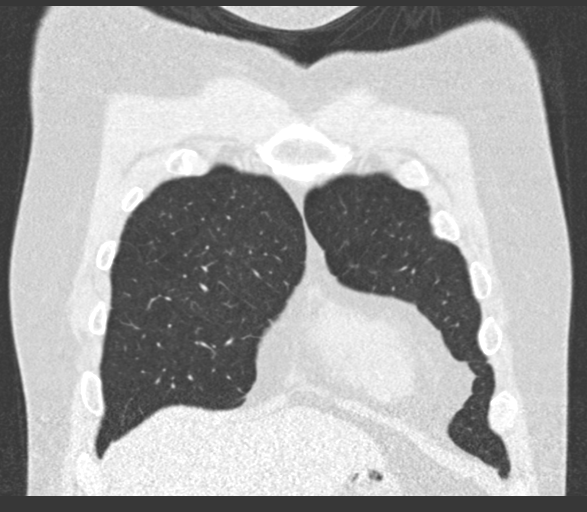
[im 123/307  lung]
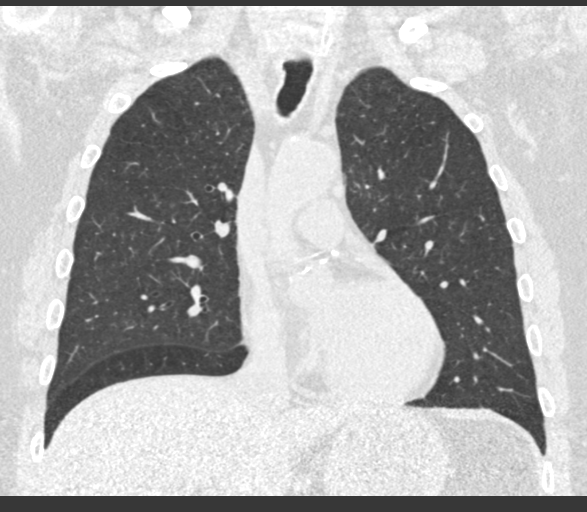
[im 184/307  lung]
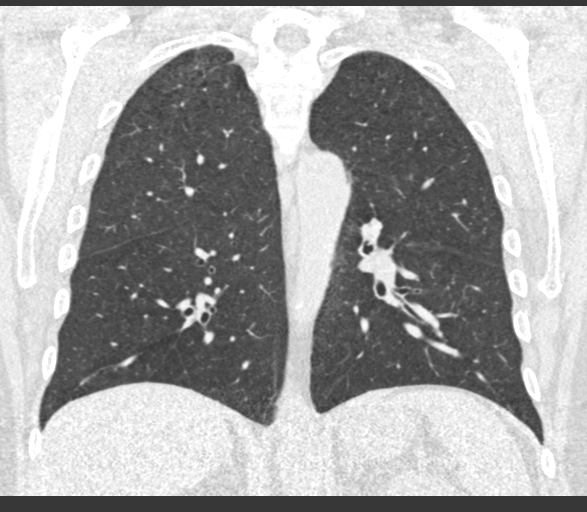

[15 of 36 positions shown; findings below may reference images not displayed]

FINDINGS: Cardiovascular: Heart size and mediastinal contours appear normal.
Aortic atherosclerosis. Coronary artery atherosclerotic
calcifications.

Mediastinum/Nodes: No enlarged mediastinal, hilar, or axillary lymph
nodes. Thyroid gland, trachea, and esophagus demonstrate no
significant findings.

Lungs/Pleura: Mild centrilobular emphysema. No pleural effusion. No
suspicious lung nodules identified at this time. Calcified granuloma
identified within the medial aspect of the left apex. There are 2
perifissural nodules identified along the oblique fissure of the
left lung.

Upper Abdomen: No acute abnormality.

Musculoskeletal: No chest wall mass or suspicious bone lesions
identified.
IMPRESSION: 1. Lung-RADS 1, negative. Continue annual screening with low-dose
chest CT without contrast in 12 months.
2. Coronary artery calcifications.
3. Emphysema and aortic atherosclerosis.

Aortic Atherosclerosis (BGWYJ-[id number].R) and Emphysema ([id number]-[id number].R).

## 2022-05-18 ENCOUNTER — Other Ambulatory Visit: Payer: Self-pay

## 2022-05-18 MED ORDER — AMLODIPINE BESYLATE 2.5 MG PO TABS
2.5000 mg | ORAL_TABLET | Freq: Every day | ORAL | 11 refills | Status: DC
  Filled 2022-05-18 – 2022-05-31 (×2): qty 30, 30d supply, fill #0
  Filled 2022-08-03: qty 30, 30d supply, fill #1

## 2022-05-25 ENCOUNTER — Other Ambulatory Visit: Payer: Self-pay

## 2022-05-31 ENCOUNTER — Other Ambulatory Visit: Payer: Self-pay | Admitting: Cardiology

## 2022-05-31 ENCOUNTER — Other Ambulatory Visit: Payer: Self-pay

## 2022-05-31 ENCOUNTER — Other Ambulatory Visit: Payer: Self-pay | Admitting: Family Medicine

## 2022-05-31 DIAGNOSIS — E1169 Type 2 diabetes mellitus with other specified complication: Secondary | ICD-10-CM

## 2022-05-31 DIAGNOSIS — J438 Other emphysema: Secondary | ICD-10-CM

## 2022-05-31 MED ORDER — ALBUTEROL SULFATE HFA 108 (90 BASE) MCG/ACT IN AERS
2.0000 | INHALATION_SPRAY | Freq: Four times a day (QID) | RESPIRATORY_TRACT | 2 refills | Status: DC | PRN
  Filled 2022-05-31: qty 18, 25d supply, fill #0
  Filled 2022-12-02: qty 18, 25d supply, fill #1

## 2022-05-31 MED ORDER — EZETIMIBE 10 MG PO TABS
10.0000 mg | ORAL_TABLET | Freq: Every day | ORAL | 0 refills | Status: DC
  Filled 2022-05-31: qty 30, 30d supply, fill #0

## 2022-05-31 MED ORDER — ROSUVASTATIN CALCIUM 10 MG PO TABS
10.0000 mg | ORAL_TABLET | Freq: Every day | ORAL | 0 refills | Status: DC
  Filled 2022-05-31: qty 30, 30d supply, fill #0

## 2022-06-14 ENCOUNTER — Other Ambulatory Visit: Payer: Self-pay

## 2022-06-14 MED ORDER — AMLODIPINE BESYLATE 5 MG PO TABS
5.0000 mg | ORAL_TABLET | Freq: Every evening | ORAL | 11 refills | Status: DC
  Filled 2022-06-14 – 2022-06-29 (×2): qty 30, 30d supply, fill #0
  Filled 2022-07-28: qty 30, 30d supply, fill #1
  Filled 2022-09-02 (×2): qty 30, 30d supply, fill #2
  Filled 2022-10-07: qty 30, 30d supply, fill #3
  Filled 2023-03-27: qty 30, 30d supply, fill #4

## 2022-06-21 ENCOUNTER — Other Ambulatory Visit: Payer: Self-pay

## 2022-06-29 ENCOUNTER — Other Ambulatory Visit: Payer: Self-pay | Admitting: Cardiology

## 2022-06-29 ENCOUNTER — Other Ambulatory Visit: Payer: Self-pay

## 2022-06-29 ENCOUNTER — Other Ambulatory Visit: Payer: Self-pay | Admitting: Family Medicine

## 2022-06-29 DIAGNOSIS — E1169 Type 2 diabetes mellitus with other specified complication: Secondary | ICD-10-CM

## 2022-06-29 DIAGNOSIS — G629 Polyneuropathy, unspecified: Secondary | ICD-10-CM

## 2022-06-29 MED ORDER — ROSUVASTATIN CALCIUM 10 MG PO TABS
10.0000 mg | ORAL_TABLET | Freq: Every day | ORAL | 0 refills | Status: DC
  Filled 2022-06-29: qty 30, 30d supply, fill #0

## 2022-06-29 MED ORDER — EZETIMIBE 10 MG PO TABS
10.0000 mg | ORAL_TABLET | Freq: Every day | ORAL | 0 refills | Status: DC
  Filled 2022-06-29: qty 30, 30d supply, fill #0

## 2022-06-29 MED ORDER — PREGABALIN 25 MG PO CAPS
25.0000 mg | ORAL_CAPSULE | Freq: Every day | ORAL | 1 refills | Status: DC
  Filled 2022-06-29: qty 30, 30d supply, fill #0
  Filled 2022-07-28: qty 30, 30d supply, fill #1

## 2022-06-29 NOTE — Telephone Encounter (Signed)
Rx refill sent to pharmacy. 

## 2022-06-30 ENCOUNTER — Other Ambulatory Visit: Payer: Self-pay

## 2022-07-01 ENCOUNTER — Other Ambulatory Visit: Payer: Self-pay

## 2022-07-04 ENCOUNTER — Other Ambulatory Visit: Payer: Self-pay | Admitting: Nephrology

## 2022-07-04 DIAGNOSIS — R109 Unspecified abdominal pain: Secondary | ICD-10-CM

## 2022-07-28 ENCOUNTER — Other Ambulatory Visit: Payer: Self-pay

## 2022-08-01 ENCOUNTER — Other Ambulatory Visit: Payer: Self-pay

## 2022-08-01 ENCOUNTER — Other Ambulatory Visit: Payer: Self-pay | Admitting: Cardiology

## 2022-08-01 DIAGNOSIS — E1169 Type 2 diabetes mellitus with other specified complication: Secondary | ICD-10-CM

## 2022-08-01 MED ORDER — ROSUVASTATIN CALCIUM 10 MG PO TABS
10.0000 mg | ORAL_TABLET | Freq: Every day | ORAL | 0 refills | Status: DC
  Filled 2022-08-01: qty 15, 15d supply, fill #0

## 2022-08-02 ENCOUNTER — Other Ambulatory Visit: Payer: Self-pay

## 2022-08-03 ENCOUNTER — Other Ambulatory Visit: Payer: Self-pay | Admitting: Cardiology

## 2022-08-03 ENCOUNTER — Other Ambulatory Visit: Payer: Self-pay

## 2022-08-03 MED ORDER — EZETIMIBE 10 MG PO TABS
10.0000 mg | ORAL_TABLET | Freq: Every day | ORAL | 0 refills | Status: DC
  Filled 2022-08-03 – 2022-09-02 (×3): qty 15, 15d supply, fill #0

## 2022-08-04 DIAGNOSIS — N185 Chronic kidney disease, stage 5: Secondary | ICD-10-CM | POA: Insufficient documentation

## 2022-08-04 DIAGNOSIS — R251 Tremor, unspecified: Secondary | ICD-10-CM | POA: Insufficient documentation

## 2022-08-04 DIAGNOSIS — N186 End stage renal disease: Secondary | ICD-10-CM | POA: Insufficient documentation

## 2022-08-09 ENCOUNTER — Other Ambulatory Visit: Payer: Self-pay

## 2022-08-09 MED ORDER — LOKELMA 10 G PO PACK
PACK | ORAL | 11 refills | Status: DC
  Filled 2022-08-09: qty 9, 30d supply, fill #0

## 2022-08-10 ENCOUNTER — Other Ambulatory Visit: Payer: Self-pay

## 2022-08-11 ENCOUNTER — Other Ambulatory Visit: Payer: Self-pay

## 2022-08-11 ENCOUNTER — Ambulatory Visit: Payer: Medicare Other | Attending: Cardiology | Admitting: Cardiology

## 2022-08-11 ENCOUNTER — Encounter: Payer: Self-pay | Admitting: Cardiology

## 2022-08-11 VITALS — BP 118/68 | HR 74 | Ht 74.0 in | Wt 210.0 lb

## 2022-08-11 DIAGNOSIS — J431 Panlobular emphysema: Secondary | ICD-10-CM

## 2022-08-11 DIAGNOSIS — F17210 Nicotine dependence, cigarettes, uncomplicated: Secondary | ICD-10-CM | POA: Insufficient documentation

## 2022-08-11 DIAGNOSIS — I2510 Atherosclerotic heart disease of native coronary artery without angina pectoris: Secondary | ICD-10-CM | POA: Diagnosis present

## 2022-08-11 DIAGNOSIS — Z720 Tobacco use: Secondary | ICD-10-CM | POA: Diagnosis not present

## 2022-08-11 DIAGNOSIS — E1151 Type 2 diabetes mellitus with diabetic peripheral angiopathy without gangrene: Secondary | ICD-10-CM

## 2022-08-11 NOTE — Patient Instructions (Signed)
Medication Instructions:  Your physician recommends that you continue on your current medications as directed. Please refer to the Current Medication list given to you today.  *If you need a refill on your cardiac medications before your next appointment, please call your pharmacy*   Lab Work: PLease have blood work forwarded to Dr. Geraldo Pitter from your primary care doctor If you have labs (blood work) drawn today and your tests are completely normal, you will receive your results only by: [place] (if you have MyChart) OR A paper copy in the mail If you have any lab test that is abnormal or we need to change your treatment, we will call you to review the results.   Testing/Procedures: NONE   Follow-Up: At [facility], you and your health needs are our priority.  As part of our continuing mission to provide you with exceptional heart care, we have created designated Provider Care Teams.  These Care Teams include your primary Cardiologist (physician) and Advanced Practice Providers (APPs -  Physician Assistants and Nurse Practitioners) who all work together to provide you with the care you need, when you need it.  We recommend signing up for the patient portal called "MyChart".  Sign up information is provided on this After Visit Summary.  MyChart is used to connect with patients for Virtual Visits (Telemedicine).  Patients are able to view lab/test results, encounter notes, upcoming appointments, etc.  Non-urgent messages can be sent to your provider as well.   To learn more about what you can do with MyChart, go to [email or web address].    Your next appointment:   1 year(s)  Provider:   Jyl Heinz, MD    Other Instructions

## 2022-08-11 NOTE — Progress Notes (Signed)
Cardiology Office Note:    Date:  08/11/2022   ID:  [name] Slade Sr., DOB [birth year], MRN [id number]  PCP:  Charlott Rakes, MD  Cardiologist:  Jenean Lindau, MD   Referring MD: Charlott Rakes, MD    ASSESSMENT:    1. Type 2 diabetes mellitus with diabetic peripheral angiopathy without gangrene, without long-term current use of insulin ([place])   2. Coronary artery calcification seen on CT scan   3. Panlobular emphysema ([place])   4. Tobacco abuse    PLAN:    In order of problems listed above:  Coronary artery calcification: Secondary prevention stressed with the patient.  Importance of compliance with diet medication stressed and vocalized understanding.  He was advised to ambulate to the best of his ability. Essential hypertension: Stable blood pressure.  This is managed by his nephrology specialist. End-stage renal failure on dialysis: Stable.  Blood work is followed by nephrology. Mixed hyperlipidemia: Patient on statin therapy.  I told him to get complete blood work when he goes for dialysis.  He agrees.  Once I evaluate the blood work I will be glad to refill his medications as necessary from a cardiovascular standpoint. Cigarette smoker: I spent 5 minutes with the patient discussing solely about smoking. Smoking cessation was counseled. I suggested to the patient also different medications and pharmacological interventions. Patient is keen to try stopping on its own at this time. He will get back to me if he needs any further assistance in this matter. Patient will be seen in follow-up appointment in 6 months or earlier if the patient has any concerns    Medication Adjustments/Labs and Tests Ordered: Current medicines are reviewed at length with the patient today.  Concerns regarding medicines are outlined above.  Orders Placed This Encounter  Procedures   EKG 12-Lead   No orders of the defined types were placed in this encounter.    No chief complaint on file.     History of Present Illness:    [name] Talmage Sr. is a 62 y.o. male.  Patient has past medical history of coronary artery calcifications, end-stage renal failure on dialysis, essential hypertension, mixed dyslipidemia, history of alcohol abuse.  Unfortunately continues to smoke.  He leads a sedentary lifestyle.  He denies any chest pain orthopnea or PND.  This is the first time I have seen him.  He is taken care of by Dr. Bettina Gavia in the past.  At the time of my evaluation, the patient is alert awake oriented and in no distress.  Past Medical History:  Diagnosis Date   Acute renal failure superimposed on stage 2 chronic kidney disease ([place]) 06/12/2018   Alcohol abuse 10/18/2017   Allergy, unspecified, initial encounter 03/26/2021   Anaphylactic shock, unspecified, initial encounter 03/26/2021   Anemia in chronic kidney disease 03/26/2021   Anxiety and depression 10/06/2017   Arthritis    Atherosclerotic heart disease of native coronary artery without angina pectoris 03/26/2021   Bilateral lower extremity edema 06/11/2018   Bradycardia with 41-50 beats per minute 05/05/2021   CAD (coronary artery disease) 10/06/2017   CHF (congestive heart failure) (HCC)    CKD (chronic kidney disease) 07/20/2018   CKD (chronic kidney disease) stage 5, GFR less than 15 ml/min (HCC)    dialysis T/Th/Sa   Complication of vascular dialysis catheter 03/26/2021   COPD (chronic obstructive pulmonary disease) ([place]) 09/29/2017   Coronary artery calcification seen on CT scan 10/09/2017   Cough    Diarrhea, unspecified 03/26/2021  Disorder of phosphorus metabolism, unspecified 07/28/2021   Dyspnea    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   Edema 07/20/2018   ESRD (end stage renal disease) ([place])    TTHSAT  [name]   Fever, unspecified 03/26/2021   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 Tremors whebn he moves it.   Shortness of breath 03/26/2021   Tobacco abuse 09/29/2017   Type 2 diabetes mellitus ([place]) 10/17/2011   diet controlled - per pt does not check CBG at home- insurance did not approve - 08/13/21   Type 2 diabetes mellitus with diabetic peripheral angiopathy without gangrene ([place]) 03/26/2021   Type 2 diabetes mellitus with other diabetic kidney complication ([place]) [id number]    Past Surgical History:  Procedure Laterality Date   A/V FISTULAGRAM Right 12/06/2021   Procedure: A/V Fistulagram;  Surgeon: Waynetta Sandy, MD;  Location: [facility];  Service: Cardiovascular;  Laterality: Right;   ANTERIOR CERVICAL DECOMP/DISCECTOMY FUSION N/A 09/14/2017   Procedure: ANTERIOR CERVICAL DECOMPRESSION/DISCECTOMY FUSIONCERVICAL 4- CERVICAL 5, CERVICAL 5- CERVICAL 6;  Surgeon: Kary Kos, MD;  Location: [facility];  Service: Neurosurgery;  Laterality: N/A;  ANTERIOR CERVICAL DECOMPRESSION/DISCECTOMY FUSIONCERVICAL 4- CERVICAL 5, CERVICAL 5- CERVICAL 6   AV FISTULA PLACEMENT Left 03/05/2021   Procedure: LEFT UPPER EXTREMITY RADIOBASILIC  ARTERIOVENOUS (AV) FISTULA CREATION;  Surgeon: [name], MD;  Location: [facility];  Service: Vascular;  Laterality: Left;  PERIPHERAL NERVE BLOCK   AV FISTULA PLACEMENT Left 04/21/2021   Procedure: INSERTION OF  LEFT ARM ARTERIOVENOUS (AV) GORE-TEX GRAFT;  Surgeon: [name], MD;  Location: [facility];  Service: Vascular;  Laterality: Left;  PERIPHERAL NERVE BLOCK   AV FISTULA PLACEMENT Right 06/30/2021   Procedure: RIGHT ARM ARTERIOVENOUS (AV) FISTULA;  Surgeon:  [name], MD;  Location: [facility];  Service: Vascular;  Laterality: Right;  PERIPHERAL NERVE BLOCK   [place] Right 08/16/2021   Procedure: RIGHT ARM SECOND STAGE BASILIC VEIN TRANSPOSITION;  Surgeon: [name], MD;  Location: [facility];  Service: Vascular;  Laterality: Right;  PERIPHERAL NERVE BLOCK, LOCAL GIVEN   IR FLUORO GUIDE CV LINE LEFT  03/25/2021   PERIPHERAL VASCULAR BALLOON ANGIOPLASTY Right 12/06/2021   Procedure: PERIPHERAL VASCULAR BALLOON ANGIOPLASTY;  Surgeon: Waynetta Sandy, MD;  Location: [facility];  Service: Cardiovascular;  Laterality: Right;   UPPER EXTREMITY VENOGRAPHY Bilateral 06/04/2021   Procedure: CENTRAL  AND UPPER VENOGRAPHY;  Surgeon: [name], MD;  Location: [facility];  Service: Cardiovascular;  Laterality: Bilateral;    Current Medications: Current Meds  Medication Sig   Accu-Chek Softclix Lancets lancets Use as instructed tid before meals. Dx E11.22   albuterol (VENTOLIN HFA) 108 (90 Base) MCG/ACT inhaler INHALE 2 PUFFS INTO THE LUNGS EVERY 6 (SIX) HOURS AS NEEDED FOR WHEEZING OR SHORTNESS OF BREATH.   amLODipine (NORVASC) 5 MG tablet Take 1 tablet (5 mg total) by mouth Nightly.   AURYXIA 1 GM 210 MG(Fe) tablet Take 420 mg by mouth 2 (two) times  daily before a meal.   Blood Glucose Monitoring Suppl (ACCU-CHEK GUIDE) w/Device KIT Use as directed 3 times daily. Dx E11.22   citalopram (CELEXA) 40 MG tablet Take 1 tablet (40 mg total) by mouth daily.   ezetimibe (ZETIA) 10 MG tablet Take 1 tablet (10 mg total) by mouth daily. Patient needs appointment for further refills. 3 rd/final attempt   fluticasone-salmeterol (ADVAIR) 250-50 MCG/ACT AEPB Inhale 1 puff into the lungs 2 (two) times daily.   glucose blood (ACCU-CHEK GUIDE) test strip Use as instructed 3 times daily. Dx E11.22   hydrOXYzine (ATARAX) 25 MG tablet TAKE 1 TABLET (25 MG TOTAL) BY MOUTH 3 (THREE) TIMES DAILY AS NEEDED FOR ANXIETY.    levETIRAcetam (KEPPRA) 500 MG tablet Take 1 tablet (500 mg total) by mouth 2 (two) times daily.   pregabalin (LYRICA) 25 MG capsule Take 1 capsule (25 mg total) by mouth at bedtime.   rosuvastatin (CRESTOR) 10 MG tablet Take 1 tablet (10 mg total) by mouth daily for 15 days. Patient needs appointment for further refills.     Allergies:   Patient has no known allergies.   Social History   Socioeconomic History   Marital status: Single    Spouse name: Not on file   Number of children: Not on file   Years of education: Not on file   Highest education level: Not on file  Occupational History   Not on file  Tobacco Use   Smoking status: Every Day    Packs/day: 1.50    Years: 42.00    Total pack years: 63.00    Types: Cigarettes   Smokeless tobacco: Never   Tobacco comments:    1 pack a day   Vaping Use   Vaping Use: Never used  Substance and Sexual Activity   Alcohol use: Not Currently    Alcohol/week: 10.0 standard drinks of alcohol    Types: 10 Cans of beer per week    Comment: quit September 2022   Drug use: Not Currently    Types: Marijuana    Comment: ocassionally   Sexual activity: Not on file  Other Topics Concern   Not on file  Social History Narrative   Not on file   Social Determinants of Health   Financial Resource Strain: Low Risk  (09/03/2021)   Overall Financial Resource Strain (CARDIA)    Difficulty of Paying Living Expenses: Not hard at all  Food Insecurity: No Food Insecurity (02/21/2022)   Hunger Vital Sign    Worried About Running Out of Food in the Last Year: Never true    [place] in the Last Year: Never true  Transportation Needs: No Transportation Needs (02/21/2022)   PRAPARE - Hydrologist (Medical): No    Lack of Transportation (Non-Medical): No  Physical Activity: Not on file  Stress: No Stress Concern Present (09/03/2021)   [place]     Feeling of Stress : Only a little  Social Connections: Moderately Isolated (09/03/2021)   Social Connection and Isolation Panel [NHANES]    Frequency of Communication with Friends and Family: More than three times a week    Frequency of Social Gatherings with Friends and Family: More than three times a week    Attends Religious Services: 1 to 4 times per year    Active Member of [employer] or Organizations: No    Attends Archivist Meetings: Never    Marital  Status: Divorced     Family History: The patient's family history includes Diabetes in his mother; Hypertension in his brother; Kidney cancer in his father; Lung cancer in his father; Scoliosis in his mother. There is no history of Colon cancer, Pancreatic cancer, Prostate cancer, Rectal cancer, or Stomach cancer.  ROS:   Please see the history of present illness.    All other systems reviewed and are negative.  EKGs/Labs/Other Studies Reviewed:    The following studies were reviewed today: EKG reveals sinus rhythm first-degree AV block and nonspecific ST-T changes   Recent Labs: 12/06/2021: BUN 28; Creatinine, Ser 9.50; Hemoglobin 11.6; Potassium 4.3; Sodium 131  Recent Lipid Panel    Component Value Date/Time   CHOL 169 02/17/2021 0924   TRIG 115 02/17/2021 0924   HDL 47 02/17/2021 0924   CHOLHDL 3.6 02/17/2021 0924   LDLCALC 101 (H) 02/17/2021 0924    Physical Exam:    VS:  BP 118/68   Pulse 74   Ht 6' 2"$  (1.88 m)   Wt 210 lb (95.3 kg)   SpO2 96%   BMI 26.96 kg/m     Wt Readings from Last 3 Encounters:  08/11/22 210 lb (95.3 kg)  02/03/22 215 lb 3.2 oz (97.6 kg)  12/06/21 216 lb (98 kg)     GEN: Patient is in no acute distress HEENT: Normal NECK: No JVD; No carotid bruits LYMPHATICS: No lymphadenopathy CARDIAC: Hear sounds regular, 2/6 systolic murmur at the apex. RESPIRATORY:  Clear to auscultation without rales, wheezing or rhonchi  ABDOMEN: Soft, non-tender, non-distended MUSCULOSKELETAL:  No  edema; No deformity  SKIN: Warm and dry NEUROLOGIC:  Alert and oriented x 3 PSYCHIATRIC:  Normal affect   Signed, Jenean Lindau, MD  08/11/2022 1:19 PM    [facility]

## 2022-08-12 ENCOUNTER — Other Ambulatory Visit: Payer: Self-pay

## 2022-08-15 ENCOUNTER — Encounter: Payer: Self-pay | Admitting: Family Medicine

## 2022-08-15 ENCOUNTER — Ambulatory Visit: Payer: Medicare Other | Attending: Family Medicine | Admitting: Family Medicine

## 2022-08-15 ENCOUNTER — Other Ambulatory Visit (HOSPITAL_COMMUNITY): Payer: Self-pay

## 2022-08-15 ENCOUNTER — Other Ambulatory Visit: Payer: Self-pay

## 2022-08-15 VITALS — BP 128/80 | HR 70 | Temp 98.0°F | Ht 74.0 in | Wt 214.0 lb

## 2022-08-15 DIAGNOSIS — F419 Anxiety disorder, unspecified: Secondary | ICD-10-CM

## 2022-08-15 DIAGNOSIS — R569 Unspecified convulsions: Secondary | ICD-10-CM | POA: Insufficient documentation

## 2022-08-15 DIAGNOSIS — J439 Emphysema, unspecified: Secondary | ICD-10-CM | POA: Diagnosis not present

## 2022-08-15 DIAGNOSIS — F17210 Nicotine dependence, cigarettes, uncomplicated: Secondary | ICD-10-CM | POA: Diagnosis not present

## 2022-08-15 DIAGNOSIS — F32A Depression, unspecified: Secondary | ICD-10-CM | POA: Insufficient documentation

## 2022-08-15 DIAGNOSIS — N2581 Secondary hyperparathyroidism of renal origin: Secondary | ICD-10-CM | POA: Diagnosis not present

## 2022-08-15 DIAGNOSIS — I509 Heart failure, unspecified: Secondary | ICD-10-CM | POA: Diagnosis not present

## 2022-08-15 DIAGNOSIS — E1140 Type 2 diabetes mellitus with diabetic neuropathy, unspecified: Secondary | ICD-10-CM | POA: Insufficient documentation

## 2022-08-15 DIAGNOSIS — D631 Anemia in chronic kidney disease: Secondary | ICD-10-CM | POA: Insufficient documentation

## 2022-08-15 DIAGNOSIS — R251 Tremor, unspecified: Secondary | ICD-10-CM | POA: Diagnosis not present

## 2022-08-15 DIAGNOSIS — J438 Other emphysema: Secondary | ICD-10-CM

## 2022-08-15 DIAGNOSIS — N186 End stage renal disease: Secondary | ICD-10-CM | POA: Diagnosis not present

## 2022-08-15 DIAGNOSIS — E1122 Type 2 diabetes mellitus with diabetic chronic kidney disease: Secondary | ICD-10-CM

## 2022-08-15 DIAGNOSIS — Z7951 Long term (current) use of inhaled steroids: Secondary | ICD-10-CM | POA: Diagnosis not present

## 2022-08-15 DIAGNOSIS — I132 Hypertensive heart and chronic kidney disease with heart failure and with stage 5 chronic kidney disease, or end stage renal disease: Secondary | ICD-10-CM | POA: Diagnosis not present

## 2022-08-15 DIAGNOSIS — Z992 Dependence on renal dialysis: Secondary | ICD-10-CM

## 2022-08-15 DIAGNOSIS — R2689 Other abnormalities of gait and mobility: Secondary | ICD-10-CM | POA: Insufficient documentation

## 2022-08-15 DIAGNOSIS — I2510 Atherosclerotic heart disease of native coronary artery without angina pectoris: Secondary | ICD-10-CM

## 2022-08-15 DIAGNOSIS — J449 Chronic obstructive pulmonary disease, unspecified: Secondary | ICD-10-CM | POA: Insufficient documentation

## 2022-08-15 DIAGNOSIS — G629 Polyneuropathy, unspecified: Secondary | ICD-10-CM

## 2022-08-15 DIAGNOSIS — E1151 Type 2 diabetes mellitus with diabetic peripheral angiopathy without gangrene: Secondary | ICD-10-CM | POA: Insufficient documentation

## 2022-08-15 LAB — POCT ABI - SCREENING FOR PILOT NO CHARGE
Left ABI: 1.19
Right ABI: 1.19

## 2022-08-15 LAB — POCT GLYCOSYLATED HEMOGLOBIN (HGB A1C): HbA1c, POC (controlled diabetic range): 6.5 % (ref 0.0–7.0)

## 2022-08-15 LAB — GLUCOSE, POCT (MANUAL RESULT ENTRY): POC Glucose: 149 mg/dl — AB (ref 70–99)

## 2022-08-15 MED ORDER — ACCU-CHEK GUIDE W/DEVICE KIT
PACK | 0 refills | Status: DC
  Filled 2022-08-15: qty 1, fill #0

## 2022-08-15 MED ORDER — CITALOPRAM HYDROBROMIDE 40 MG PO TABS
40.0000 mg | ORAL_TABLET | Freq: Every day | ORAL | 1 refills | Status: DC
  Filled 2022-08-15: qty 90, 90d supply, fill #0
  Filled 2022-12-02 – 2023-01-30 (×2): qty 90, 90d supply, fill #1

## 2022-08-15 MED ORDER — LEVETIRACETAM 500 MG PO TABS
500.0000 mg | ORAL_TABLET | Freq: Two times a day (BID) | ORAL | 1 refills | Status: DC
  Filled 2022-08-15: qty 180, 90d supply, fill #0
  Filled 2022-12-02: qty 180, 90d supply, fill #1

## 2022-08-15 MED ORDER — HYDROXYZINE HCL 25 MG PO TABS
25.0000 mg | ORAL_TABLET | Freq: Three times a day (TID) | ORAL | 3 refills | Status: DC | PRN
  Filled 2022-08-15: qty 90, fill #0
  Filled 2022-09-02 (×2): qty 90, 30d supply, fill #0
  Filled 2022-10-07: qty 90, 30d supply, fill #1
  Filled 2022-12-02: qty 90, 30d supply, fill #2
  Filled 2023-01-30: qty 90, 30d supply, fill #3

## 2022-08-15 MED ORDER — NICOTINE 14 MG/24HR TD PT24
14.0000 mg | MEDICATED_PATCH | Freq: Every day | TRANSDERMAL | 0 refills | Status: DC
  Filled 2022-08-15: qty 28, 28d supply, fill #0

## 2022-08-15 MED ORDER — ACCU-CHEK GUIDE VI STRP
ORAL_STRIP | 12 refills | Status: AC
  Filled 2022-08-15: qty 100, 25d supply, fill #0

## 2022-08-15 MED ORDER — ZOSTER VAC RECOMB ADJUVANTED 50 MCG/0.5ML IM SUSR: 0.5000 mL | Freq: Once | INTRAMUSCULAR | 1 refills | Status: AC

## 2022-08-15 MED ORDER — PREGABALIN 25 MG PO CAPS
25.0000 mg | ORAL_CAPSULE | Freq: Every day | ORAL | 1 refills | Status: DC
  Filled 2022-08-15 – 2022-09-02 (×3): qty 30, 30d supply, fill #0
  Filled 2022-10-07: qty 30, 30d supply, fill #1

## 2022-08-15 MED ORDER — ACCU-CHEK SOFTCLIX LANCETS MISC
2 refills | Status: DC
  Filled 2022-08-15: qty 100, fill #0

## 2022-08-15 NOTE — Progress Notes (Signed)
Needs medication refills.

## 2022-08-15 NOTE — Progress Notes (Signed)
Subjective:  Patient ID: [name] Slade Sr., male    DOB: 1960/08/05  Age: 62 y.o. MRN: [id number]  CC: Diabetes   HPI [name] Delker Sr. is a 62 y.o. year old male with a history of  type 2 diabetes mellitus (diet-controlled A1c 6.6), Seizures, ESRD (on HD T, Th, Sat) Fibrillary glomerulonephritis,, alcohol abuse, hypertension, Nicotine dependence (1 ppd down from 2-3 ppd since age 89) coronary artery calcifications, ACDF of C4,5 and C5,6 on 09/14/17  By Dr Saintclair Halsted secondary to Myelopathy seen for a follow-up visit.   Interval History:  He sometimes feels drained after hemodialysis sessions. Also has balance problems as he has sporadic tremors in his LEFT UPPER EXTREMITY and LEFT LOWER EXTREMITY and so he stumbles. His Neurosurgeon had told him there was no remedy.  He does not have a Glucometer as the Pharmacy had said insurance did not cover the previous one I sent.  Diabetes is diet controlled and he has had no hypoglycemia or visual concerns. Seizures are controlled. He has no chest pain or dyspnea and emphysema is controlled on his current regimen. He continues to smoke but has cut back from 2 to 3 packs/day to 1 pack/day.  Previously prescribed Wellbutrin but he states he did not like how it made him feel. Denies presence of additional concerns today. Past Medical History:  Diagnosis Date   Acute renal failure superimposed on stage 2 chronic kidney disease ([place]) 06/12/2018   Alcohol abuse 10/18/2017   Allergy, unspecified, initial encounter 03/26/2021   Anaphylactic shock, unspecified, initial encounter 03/26/2021   Anemia in chronic kidney disease 03/26/2021   Anxiety and depression 10/06/2017   Arthritis    Atherosclerotic heart disease of native coronary artery without angina pectoris 03/26/2021   Bilateral lower extremity edema 06/11/2018   Bradycardia with 41-50 beats per minute 05/05/2021   CAD (coronary artery disease) 10/06/2017   CHF (congestive heart failure) (HCC)     CKD (chronic kidney disease) 07/20/2018   CKD (chronic kidney disease) stage 5, GFR less than 15 ml/min (HCC)    dialysis T/Th/Sa   Complication of vascular dialysis catheter 03/26/2021   COPD (chronic obstructive pulmonary disease) ([place]) 09/29/2017   Coronary artery calcification seen on CT scan 10/09/2017   Cough    Diarrhea, unspecified 03/26/2021   Disorder of phosphorus metabolism, unspecified 07/28/2021   Dyspnea    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   Edema 07/20/2018   ESRD (end stage renal disease) ([place])    TTHSAT  [name] [name]   Fever, unspecified 03/26/2021   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   Heart murmur    Hyperkalemia 06/12/2018   Hypertension 09/29/2017   Immunosuppression due to drug therapy ([place]) 05/21/2019   On Rituximab for Fibrillary GN   Iron deficiency anemia, unspecified 03/26/2021   Myelopathy ([place]) 09/14/2017   Neuropathy 10/18/2017   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 Tremors whebn he moves it.   Shortness of breath 03/26/2021   Tobacco abuse 09/29/2017   Type 2 diabetes mellitus ([place]) 10/17/2011   diet controlled - per pt does not check CBG at home- insurance did not approve - 08/13/21   Type 2 diabetes mellitus with diabetic peripheral angiopathy without gangrene ([place]) 03/26/2021   Type 2 diabetes mellitus with other diabetic kidney complication ([place]) [id number]    Past Surgical History:  Procedure  Laterality Date   A/V FISTULAGRAM Right 12/06/2021   Procedure: A/V Fistulagram;  Surgeon: Waynetta Sandy, MD;  Location: [facility];  Service: Cardiovascular;  Laterality: Right;   ANTERIOR CERVICAL DECOMP/DISCECTOMY FUSION N/A 09/14/2017   Procedure: ANTERIOR CERVICAL DECOMPRESSION/DISCECTOMY FUSIONCERVICAL 4- CERVICAL 5, CERVICAL 5-  CERVICAL 6;  Surgeon: Kary Kos, MD;  Location: [facility];  Service: Neurosurgery;  Laterality: N/A;  ANTERIOR CERVICAL DECOMPRESSION/DISCECTOMY FUSIONCERVICAL 4- CERVICAL 5, CERVICAL 5- CERVICAL 6   AV FISTULA PLACEMENT Left 03/05/2021   Procedure: LEFT UPPER EXTREMITY RADIOBASILIC  ARTERIOVENOUS (AV) FISTULA CREATION;  Surgeon: [name], MD;  Location: [facility];  Service: Vascular;  Laterality: Left;  PERIPHERAL NERVE BLOCK   AV FISTULA PLACEMENT Left 04/21/2021   Procedure: INSERTION OF  LEFT ARM ARTERIOVENOUS (AV) GORE-TEX GRAFT;  Surgeon: [name], MD;  Location: [facility];  Service: Vascular;  Laterality: Left;  PERIPHERAL NERVE BLOCK   AV FISTULA PLACEMENT Right 06/30/2021   Procedure: RIGHT ARM ARTERIOVENOUS (AV) FISTULA;  Surgeon: [name], MD;  Location: [facility];  Service: Vascular;  Laterality: Right;  PERIPHERAL NERVE BLOCK   [place] Right 08/16/2021   Procedure: RIGHT ARM SECOND STAGE BASILIC VEIN TRANSPOSITION;  Surgeon: [name], MD;  Location: [facility];  Service: Vascular;  Laterality: Right;  PERIPHERAL NERVE BLOCK, LOCAL GIVEN   IR FLUORO GUIDE CV LINE LEFT  03/25/2021   PERIPHERAL VASCULAR BALLOON ANGIOPLASTY Right 12/06/2021   Procedure: PERIPHERAL VASCULAR BALLOON ANGIOPLASTY;  Surgeon: Waynetta Sandy, MD;  Location: [facility];  Service: Cardiovascular;  Laterality: Right;   UPPER EXTREMITY VENOGRAPHY Bilateral 06/04/2021   Procedure: CENTRAL  AND UPPER VENOGRAPHY;  Surgeon: [name], MD;  Location: [facility];  Service: Cardiovascular;  Laterality: Bilateral;    Family History  Problem Relation Age of Onset   Diabetes Mother    Scoliosis Mother    Kidney cancer Father    Lung cancer Father    Hypertension Brother    Colon cancer Neg Hx    Pancreatic cancer Neg Hx    Prostate cancer Neg Hx    Rectal cancer Neg Hx    Stomach cancer Neg Hx     Social History   Socioeconomic History   Marital status:  Single    Spouse name: Not on file   Number of children: Not on file   Years of education: Not on file   Highest education level: Not on file  Occupational History   Not on file  Tobacco Use   Smoking status: Every Day    Packs/day: 1.50    Years: 42.00    Total pack years: 63.00    Types: Cigarettes   Smokeless tobacco: Never   Tobacco comments:    1 pack a day   Vaping Use   Vaping Use: Never used  Substance and Sexual Activity   Alcohol use: Not Currently    Alcohol/week: 10.0 standard drinks of alcohol    Types: 10 Cans of beer per week    Comment: quit September 2022   Drug use: Not Currently    Types: Marijuana    Comment: ocassionally   Sexual activity: Not on file  Other Topics Concern   Not on file  Social History Narrative   Not on file   Social Determinants of Health   Financial Resource Strain: Low Risk  (09/03/2021)   Overall Financial Resource Strain (CARDIA)    Difficulty of Paying  Living Expenses: Not hard at all  Food Insecurity: No Food Insecurity (02/21/2022)   Hunger Vital Sign    Worried About Running Out of Food in the Last Year: Never true    Ran Out of Food in the Last Year: Never true  Transportation Needs: No Transportation Needs (02/21/2022)   PRAPARE - Hydrologist (Medical): No    Lack of Transportation (Non-Medical): No  Physical Activity: Not on file  Stress: No Stress Concern Present (09/03/2021)   [place]    Feeling of Stress : Only a little  Social Connections: Moderately Isolated (09/03/2021)   Social Connection and Isolation Panel [NHANES]    Frequency of Communication with Friends and Family: More than three times a week    Frequency of Social Gatherings with Friends and Family: More than three times a week    Attends Religious Services: 1 to 4 times per year    Active Member of [employer] or Organizations: No    Attends Theatre manager Meetings: Never    Marital Status: Divorced    No Known Allergies  Outpatient Medications Prior to Visit  Medication Sig Dispense Refill   albuterol (VENTOLIN HFA) 108 (90 Base) MCG/ACT inhaler INHALE 2 PUFFS INTO THE LUNGS EVERY 6 (SIX) HOURS AS NEEDED FOR WHEEZING OR SHORTNESS OF BREATH. 18 g 2   amLODipine (NORVASC) 5 MG tablet Take 1 tablet (5 mg total) by mouth Nightly. 30 tablet 11   AURYXIA 1 GM 210 MG(Fe) tablet Take 420 mg by mouth 2 (two) times daily before a meal.     ezetimibe (ZETIA) 10 MG tablet Take 1 tablet (10 mg total) by mouth daily. Patient needs appointment for further refills. 3 rd/final attempt 15 tablet 0   fluticasone-salmeterol (ADVAIR) 250-50 MCG/ACT AEPB Inhale 1 puff into the lungs 2 (two) times daily. 60 each 6   rosuvastatin (CRESTOR) 10 MG tablet Take 1 tablet (10 mg total) by mouth daily for 15 days. Patient needs appointment for further refills. 15 tablet 0   Accu-Chek Softclix Lancets lancets Use as instructed tid before meals. Dx E11.22 100 each 2   Blood Glucose Monitoring Suppl (ACCU-CHEK GUIDE) w/Device KIT Use as directed 3 times daily. Dx E11.22 1 kit 0   citalopram (CELEXA) 40 MG tablet Take 1 tablet (40 mg total) by mouth daily. 90 tablet 1   glucose blood (ACCU-CHEK GUIDE) test strip Use as instructed 3 times daily. Dx E11.22 100 each 12   hydrOXYzine (ATARAX) 25 MG tablet TAKE 1 TABLET (25 MG TOTAL) BY MOUTH 3 (THREE) TIMES DAILY AS NEEDED FOR ANXIETY. 90 tablet 3   levETIRAcetam (KEPPRA) 500 MG tablet Take 1 tablet (500 mg total) by mouth 2 (two) times daily. 180 tablet 1   pregabalin (LYRICA) 25 MG capsule Take 1 capsule (25 mg total) by mouth at bedtime. 30 capsule 1   lidocaine (LIDODERM) 5 % Place 1 patch onto the skin daily. Remove & Discard patch within 12 hours or as directed by MD (Patient not taking: Reported on 08/03/2021) 30 patch 3   No facility-administered medications prior to visit.     ROS Review of Systems   Constitutional:  Negative for activity change and appetite change.  HENT:  Negative for sinus pressure and sore throat.   Respiratory:  Negative for chest tightness, shortness of breath and wheezing.   Cardiovascular:  Negative for chest pain and palpitations.  Gastrointestinal:  Negative  for abdominal distention, abdominal pain and constipation.  Genitourinary: Negative.   Musculoskeletal: Negative.   Psychiatric/Behavioral:  Negative for behavioral problems and dysphoric mood.     Objective:  BP 128/80   Pulse 70   Temp 98 F (36.7 C) (Oral)   Ht 6' 2"$  (1.88 m)   Wt 214 lb 0.3 oz (97.1 kg)   SpO2 97%   BMI 27.48 kg/m      08/15/2022    8:34 AM 08/11/2022   12:57 PM 02/03/2022    8:44 AM  BP/Weight  Systolic BP 0000000 [id number] 0000000  Diastolic BP 80 68 80  Wt. (Lbs) 214.02 210 215.2  BMI 27.48 kg/m2 26.96 kg/m2 27.63 kg/m2      Physical Exam Constitutional:      Appearance: He is well-developed.  Cardiovascular:     Rate and Rhythm: Normal rate.     Heart sounds: Normal heart sounds. No murmur heard. Pulmonary:     Effort: Pulmonary effort is normal.     Breath sounds: Normal breath sounds. No wheezing or rales.  Chest:     Chest wall: No tenderness.  Abdominal:     General: Bowel sounds are normal. There is no distension.     Palpations: Abdomen is soft. There is no mass.     Tenderness: There is no abdominal tenderness.  Musculoskeletal:        General: Normal range of motion.     Right lower leg: No edema.     Left lower leg: No edema.  Neurological:     Mental Status: He is alert and oriented to person, place, and time.  Psychiatric:        Mood and Affect: Mood normal.    Diabetic Foot Exam - Simple   Simple Foot Form Diabetic Foot exam was performed with the following findings: Yes 08/15/2022  9:05 AM  Visual Inspection No deformities, no ulcerations, no other skin breakdown bilaterally: Yes Sensation Testing Intact to touch and monofilament testing  bilaterally: Yes Pulse Check See comments: Yes Comments Unable to palpate bilateral posterior tibialis and bilateral dorsalis pedis ABI: 1.19 bilaterally         Latest Ref Rng & Units 12/06/2021    9:58 AM 08/16/2021    6:21 AM 06/30/2021    6:56 AM  CMP  Glucose 70 - 99 mg/dL 129  139  127   BUN 8 - 23 mg/dL 28  35  30   Creatinine 0.61 - 1.24 mg/dL 9.50  9.00  7.10   Sodium 135 - 145 mmol/L 131  131  137   Potassium 3.5 - 5.1 mmol/L 4.3  4.5  4.9   Chloride 98 - 111 mmol/L 90  94  102     Lipid Panel     Component Value Date/Time   CHOL 169 02/17/2021 0924   TRIG 115 02/17/2021 0924   HDL 47 02/17/2021 0924   CHOLHDL 3.6 02/17/2021 0924   LDLCALC 101 (H) 02/17/2021 0924    CBC    Component Value Date/Time   WBC 11.8 (H) 03/25/2021 0830   RBC 4.03 (L) 03/25/2021 0830   HGB 11.6 (L) 12/06/2021 0958   HCT 34.0 (L) 12/06/2021 0958   PLT 354 03/25/2021 0830   MCV 92.1 03/25/2021 0830   MCH 30.3 03/25/2021 0830   MCHC 32.9 03/25/2021 0830   RDW 12.7 03/25/2021 0830   LYMPHSABS 1.3 03/25/2021 0830   MONOABS 1.1 (H) 03/25/2021 0830   EOSABS 0.5 03/25/2021 0830  BASOSABS 0.1 03/25/2021 0830    Lab Results  Component Value Date   HGBA1C 6.5 08/15/2022    Assessment & Plan:  1. Type 2 diabetes mellitus with chronic kidney disease on chronic dialysis, without long-term current use of insulin (HCC) Diet controlled with A1c of 6.5 Continue hemodialysis per schedule Counseled on Diabetic diet, my plate method, [id number] minutes of moderate intensity exercise/week Blood sugar logs with fasting goals of 80-120 mg/dl, random of less than 180 and in the event of sugars less than 60 mg/dl or greater than 400 mg/dl encouraged to notify the clinic. Advised on the need for annual eye exams, annual foot exams, Pneumonia vaccine. - POCT glucose (manual entry) - POCT glycosylated hemoglobin (Hb A1C) - LP+Non-HDL Cholesterol - Accu-Chek Softclix Lancets lancets; Use as instructed  tid before meals. Dx E11.22  Dispense: 100 each; Refill: 2 - Blood Glucose Monitoring Suppl (ACCU-CHEK GUIDE) w/Device KIT; Use as directed 3 times daily.  Dispense: 1 kit; Refill: 0 - glucose blood (ACCU-CHEK GUIDE) test strip; Use as instructed 3 times daily. Dx E11.22  Dispense: 100 each; Refill: 12  2. Smoking greater than 20 pack years Spent 3 minutes counseling on smoking cessation and need to quit he is working on quitting - [place]; Future - nicotine (NICODERM CQ) 14 mg/24hr patch; Place 1 patch (14 mg total) onto the skin daily.  Dispense: 28 patch; Refill: 0  3. Anxiety and depression Controlled - citalopram (CELEXA) 40 MG tablet; Take 1 tablet (40 mg total) by mouth daily.  Dispense: 90 tablet; Refill: 1 - hydrOXYzine (ATARAX) 25 MG tablet; TAKE 1 TABLET (25 MG TOTAL) BY MOUTH 3 (THREE) TIMES DAILY AS NEEDED FOR ANXIETY.  Dispense: 90 tablet; Refill: 3  4. Seizure ([place]) Stable - levETIRAcetam (KEPPRA) 500 MG tablet; Take 1 tablet (500 mg total) by mouth 2 (two) times daily.  Dispense: 180 tablet; Refill: 1  5. Neuropathy Stable - pregabalin (LYRICA) 25 MG capsule; Take 1 capsule (25 mg total) by mouth at bedtime.  Dispense: 30 capsule; Refill: 1  6. Other emphysema (HCC) No acute exacerbation Continue ICS/LABA  7. Coronary artery disease involving native heart without angina pectoris, unspecified vessel or lesion type Asymptomatic Risk factor modification - POCT ABI Screening for Pilot No Charge   Meds ordered this encounter  Medications   Zoster Vaccine Adjuvanted [facility]) injection    Sig: Inject 0.5 mLs into the muscle once for 1 dose.    Dispense:  0.5 mL    Refill:  1   nicotine (NICODERM CQ) 14 mg/24hr patch    Sig: Place 1 patch (14 mg total) onto the skin daily.    Dispense:  28 patch    Refill:  0   citalopram (CELEXA) 40 MG tablet    Sig: Take 1 tablet (40 mg total) by mouth daily.    Dispense:  90 tablet     Refill:  1   hydrOXYzine (ATARAX) 25 MG tablet    Sig: TAKE 1 TABLET (25 MG TOTAL) BY MOUTH 3 (THREE) TIMES DAILY AS NEEDED FOR ANXIETY.    Dispense:  90 tablet    Refill:  3   levETIRAcetam (KEPPRA) 500 MG tablet    Sig: Take 1 tablet (500 mg total) by mouth 2 (two) times daily.    Dispense:  180 tablet    Refill:  1   pregabalin (LYRICA) 25 MG capsule    Sig: Take 1 capsule (25 mg total)  by mouth at bedtime.    Dispense:  30 capsule    Refill:  1   Accu-Chek Softclix Lancets lancets    Sig: Use as instructed tid before meals. Dx E11.22    Dispense:  100 each    Refill:  2   Blood Glucose Monitoring Suppl (ACCU-CHEK GUIDE) w/Device KIT    Sig: Use as directed 3 times daily.    Dispense:  1 kit    Refill:  0   glucose blood (ACCU-CHEK GUIDE) test strip    Sig: Use as instructed 3 times daily. Dx E11.22    Dispense:  100 each    Refill:  12    Follow-up: Return in about 6 months (around 02/13/2023) for nurse visit, Blood Pressure follow-up.       Charlott Rakes, MD, FAAFP. [facility], [place]   08/15/2022, 3:24 PM

## 2022-08-15 NOTE — Patient Instructions (Signed)

## 2022-08-16 LAB — LP+NON-HDL CHOLESTEROL
Cholesterol, Total: 134 mg/dL (ref 100–199)
HDL: 49 mg/dL (ref >39)
LDL Chol Calc (NIH): 69 mg/dL (ref 0–99)
Total Non-HDL-Chol (LDL+VLDL): 85 mg/dL (ref 0–129)
Triglycerides: 84 mg/dL (ref 0–149)
VLDL Cholesterol Cal: 16 mg/dL (ref 5–40)

## 2022-08-17 ENCOUNTER — Other Ambulatory Visit: Payer: Self-pay

## 2022-08-18 ENCOUNTER — Other Ambulatory Visit: Payer: Self-pay

## 2022-08-23 ENCOUNTER — Other Ambulatory Visit: Payer: Self-pay

## 2022-08-23 MED ORDER — LOKELMA 10 G PO PACK
PACK | ORAL | 11 refills | Status: DC
  Filled 2022-08-23 (×2): qty 9, 30d supply, fill #0

## 2022-08-30 ENCOUNTER — Other Ambulatory Visit: Payer: Self-pay

## 2022-09-02 ENCOUNTER — Other Ambulatory Visit: Payer: Self-pay

## 2022-09-02 ENCOUNTER — Other Ambulatory Visit: Payer: Self-pay | Admitting: Family Medicine

## 2022-09-02 DIAGNOSIS — E1169 Type 2 diabetes mellitus with other specified complication: Secondary | ICD-10-CM

## 2022-09-02 MED ORDER — ROSUVASTATIN CALCIUM 10 MG PO TABS
10.0000 mg | ORAL_TABLET | Freq: Every day | ORAL | 1 refills | Status: DC
  Filled 2022-09-02: qty 90, 90d supply, fill #0
  Filled 2022-12-02: qty 90, 90d supply, fill #1

## 2022-09-06 ENCOUNTER — Telehealth: Payer: Self-pay | Admitting: Family Medicine

## 2022-09-06 NOTE — Telephone Encounter (Signed)
Contacted [employer] Sr[employer] to schedule their annual wellness visit. Appointment made for 09/12/22.  [name] [name] AWV direct phone # [phone]

## 2022-09-12 ENCOUNTER — Ambulatory Visit: Payer: Medicare Other | Attending: Family Medicine

## 2022-09-12 VITALS — Ht 74.0 in | Wt 212.0 lb

## 2022-09-12 DIAGNOSIS — Z0000 Encounter for general adult medical examination without abnormal findings: Secondary | ICD-10-CM | POA: Diagnosis not present

## 2022-09-12 NOTE — Patient Instructions (Signed)
[name] [name] , Thank you for taking time to come for your Medicare Wellness Visit. I appreciate your ongoing commitment to your health goals. Please review the following plan we discussed and let me know if I can assist you in the future.   These are the goals we discussed:  Goals      Patient Stated     09/12/2022, quit smoking        This is a list of the screening recommended for you and due dates:  Health Maintenance  Topic Date Due   Zoster (Shingles) Vaccine (1 of 2) Never done   COVID-19 Vaccine (3 - Moderna risk series) 11/13/2019   Screening for Lung Cancer  02/22/2022   Eye exam for diabetics  03/22/2022   Hemoglobin A1C  02/13/2023   Cologuard (Stool DNA test)  02/19/2023   Complete foot exam   08/16/2023   Medicare Annual Wellness Visit  09/12/2023   Flu Shot  Completed   Hepatitis C Screening: USPSTF Recommendation to screen - Ages 18-79 yo.  Completed   HIV Screening  Completed   HPV Vaccine  Aged Out   DTaP/Tdap/Td vaccine  Discontinued   Colon Cancer Screening  Discontinued    Advanced directives: Advance directive discussed with you today. Even though you declined this today please call our office should you change your mind and we can give you the proper paperwork for you to fill out.  Conditions/risks identified: smoking  Next appointment: Follow up in one year for your annual wellness visit   Preventive Care 40-64 Years, Male Preventive care refers to lifestyle choices and visits with your health care provider that can promote health and wellness. What does preventive care include? A yearly physical exam. This is also called an annual well check. Dental exams once or twice a year. Routine eye exams. Ask your health care provider how often you should have your eyes checked. Personal lifestyle choices, including: Daily care of your teeth and gums. Regular physical activity. Eating a healthy diet. Avoiding tobacco and drug use. Limiting alcohol  use. Practicing safe sex. Taking low-dose aspirin every day starting at age 7. What happens during an annual well check? The services and screenings done by your health care provider during your annual well check will depend on your age, overall health, lifestyle risk factors, and family history of disease. Counseling  Your health care provider may ask you questions about your: Alcohol use. Tobacco use. Drug use. Emotional well-being. Home and relationship well-being. Sexual activity. Eating habits. Work and work Statistician. Screening  You may have the following tests or measurements: Height, weight, and BMI. Blood pressure. Lipid and cholesterol levels. These may be checked every 5 years, or more frequently if you are over 84 years old. Skin check. Lung cancer screening. You may have this screening every year starting at age 35 if you have a 30-pack-year history of smoking and currently smoke or have quit within the past 15 years. Fecal occult blood test (FOBT) of the stool. You may have this test every year starting at age 52. Flexible sigmoidoscopy or colonoscopy. You may have a sigmoidoscopy every 5 years or a colonoscopy every 10 years starting at age 64. Prostate cancer screening. Recommendations will vary depending on your family history and other risks. Hepatitis C blood test. Hepatitis B blood test. Sexually transmitted disease (STD) testing. Diabetes screening. This is done by checking your blood sugar (glucose) after you have not eaten for a while (fasting). You may have  this done every 1-3 years. Discuss your test results, treatment options, and if necessary, the need for more tests with your health care provider. Vaccines  Your health care provider may recommend certain vaccines, such as: Influenza vaccine. This is recommended every year. Tetanus, diphtheria, and acellular pertussis (Tdap, Td) vaccine. You may need a Td booster every 10 years. Zoster vaccine. You may  need this after age 36. Pneumococcal 13-valent conjugate (PCV13) vaccine. You may need this if you have certain conditions and have not been vaccinated. Pneumococcal polysaccharide (PPSV23) vaccine. You may need one or two doses if you smoke cigarettes or if you have certain conditions. Talk to your health care provider about which screenings and vaccines you need and how often you need them. This information is not intended to replace advice given to you by your health care provider. Make sure you discuss any questions you have with your health care provider. Document Released: 07/10/2015 Document Revised: 03/02/2016 Document Reviewed: 04/14/2015 Elsevier Interactive Patient Education  2017 [place] Prevention in the Home Falls can cause injuries. They can happen to people of all ages. There are many things you can do to make your home safe and to help prevent falls. What can I do on the outside of my home? Regularly fix the edges of walkways and driveways and fix any cracks. Remove anything that might make you trip as you walk through a door, such as a raised step or threshold. Trim any bushes or trees on the path to your home. Use bright outdoor lighting. Clear any walking paths of anything that might make someone trip, such as rocks or tools. Regularly check to see if handrails are loose or broken. Make sure that both sides of any steps have handrails. Any raised decks and porches should have guardrails on the edges. Have any leaves, snow, or ice cleared regularly. Use sand or salt on walking paths during winter. Clean up any spills in your garage right away. This includes oil or grease spills. What can I do in the bathroom? Use night lights. Install grab bars by the toilet and in the tub and shower. Do not use towel bars as grab bars. Use non-skid mats or decals in the tub or shower. If you need to sit down in the shower, use a plastic, non-slip stool. Keep the floor dry.  Clean up any water that spills on the floor as soon as it happens. Remove soap buildup in the tub or shower regularly. Attach bath mats securely with double-sided non-slip rug tape. Do not have throw rugs and other things on the floor that can make you trip. What can I do in the bedroom? Use night lights. Make sure that you have a light by your bed that is easy to reach. Do not use any sheets or blankets that are too big for your bed. They should not hang down onto the floor. Have a firm chair that has side arms. You can use this for support while you get dressed. Do not have throw rugs and other things on the floor that can make you trip. What can I do in the kitchen? Clean up any spills right away. Avoid walking on wet floors. Keep items that you use a lot in easy-to-reach places. If you need to reach something above you, use a strong step stool that has a grab bar. Keep electrical cords out of the way. Do not use floor polish or wax that makes floors slippery. If you  must use wax, use non-skid floor wax. Do not have throw rugs and other things on the floor that can make you trip. What can I do with my stairs? Do not leave any items on the stairs. Make sure that there are handrails on both sides of the stairs and use them. Fix handrails that are broken or loose. Make sure that handrails are as long as the stairways. Check any carpeting to make sure that it is firmly attached to the stairs. Fix any carpet that is loose or worn. Avoid having throw rugs at the top or bottom of the stairs. If you do have throw rugs, attach them to the floor with carpet tape. Make sure that you have a light switch at the top of the stairs and the bottom of the stairs. If you do not have them, ask someone to add them for you. What else can I do to help prevent falls? Wear shoes that: Do not have high heels. Have rubber bottoms. Are comfortable and fit you well. Are closed at the toe. Do not wear sandals. If  you use a stepladder: Make sure that it is fully opened. Do not climb a closed stepladder. Make sure that both sides of the stepladder are locked into place. Ask someone to hold it for you, if possible. Clearly mark and make sure that you can see: Any grab bars or handrails. First and last steps. Where the edge of each step is. Use tools that help you move around (mobility aids) if they are needed. These include: Canes. Walkers. Scooters. Crutches. Turn on the lights when you go into a dark area. Replace any light bulbs as soon as they burn out. Set up your furniture so you have a clear path. Avoid moving your furniture around. If any of your floors are uneven, fix them. If there are any pets around you, be aware of where they are. Review your medicines with your doctor. Some medicines can make you feel dizzy. This can increase your chance of falling. Ask your doctor what other things that you can do to help prevent falls. This information is not intended to replace advice given to you by your health care provider. Make sure you discuss any questions you have with your health care provider. Document Released: 04/09/2009 Document Revised: 11/19/2015 Document Reviewed: 07/18/2014 Elsevier Interactive Patient Education  2017 [employer].

## 2022-09-12 NOTE — Progress Notes (Signed)
I connected with  [name] Ruback Sr. on 09/12/22 by a audio enabled telemedicine application and verified that I am speaking with the correct person using two identifiers.  Patient Location: Home  Provider Location: Office/Clinic  I discussed the limitations of evaluation and management by telemedicine. The patient expressed understanding and agreed to proceed.  Subjective:   [name] Desorbo Sr. is a 62 y.o. male who presents for Medicare Annual/Subsequent preventive examination.  Review of Systems     Cardiac Risk Factors include: advanced age (>66men, >65 women);diabetes mellitus;hypertension;male gender;smoking/ tobacco exposure     Objective:    Today's Vitals   09/12/22 1356  Weight: 212 lb (96.2 kg)  Height: 6\' 2"  (1.88 m)   Body mass index is 27.22 kg/m.     09/12/2022    2:04 PM 12/06/2021    9:03 AM 09/03/2021    2:24 PM 08/16/2021    6:06 AM 06/30/2021    6:56 AM 06/04/2021    5:55 AM 04/21/2021    8:09 AM  Advanced Directives  Does Patient Have a Medical Advance Directive? No No No No No No No  Would patient like information on creating a medical advance directive?  No - Patient declined No - Patient declined  No - Patient declined No - Patient declined No - Patient declined    Current Medications (verified) Outpatient Encounter Medications as of 09/12/2022  Medication Sig   Accu-Chek Softclix Lancets lancets Use as instructed tid before meals. Dx E11.22   albuterol (VENTOLIN HFA) 108 (90 Base) MCG/ACT inhaler INHALE 2 PUFFS INTO THE LUNGS EVERY 6 (SIX) HOURS AS NEEDED FOR WHEEZING OR SHORTNESS OF BREATH.   amLODipine (NORVASC) 5 MG tablet Take 1 tablet (5 mg total) by mouth Nightly.   AURYXIA 1 GM 210 MG(Fe) tablet Take 420 mg by mouth 2 (two) times daily before a meal.   Blood Glucose Monitoring Suppl (ACCU-CHEK GUIDE) w/Device KIT Use as directed 3 times daily.   citalopram (CELEXA) 40 MG tablet Take 1 tablet (40 mg total) by mouth daily.   ezetimibe (ZETIA) 10  MG tablet Take 1 tablet (10 mg total) by mouth daily. Patient needs appointment for further refills. 3 rd/final attempt   fluticasone-salmeterol (ADVAIR) 250-50 MCG/ACT AEPB Inhale 1 puff into the lungs 2 (two) times daily.   glucose blood (ACCU-CHEK GUIDE) test strip Use as instructed 3 times daily. Dx E11.22   hydrOXYzine (ATARAX) 25 MG tablet Take 1 tablet (25 mg total) by mouth 3 (three) times daily as needed for anxiety.   levETIRAcetam (KEPPRA) 500 MG tablet Take 1 tablet (500 mg total) by mouth 2 (two) times daily.   nicotine (NICODERM CQ) 14 mg/24hr patch Place 1 patch (14 mg total) onto the skin daily.   pregabalin (LYRICA) 25 MG capsule Take 1 capsule (25 mg total) by mouth at bedtime.   rosuvastatin (CRESTOR) 10 MG tablet Take 1 tablet (10 mg total) by mouth daily.   sodium zirconium cyclosilicate (LOKELMA) 10 g PACK packet Take 10 gram by mouth twice a week as directed on non-dialysis days (Wed and Sunday)   lidocaine (LIDODERM) 5 % Place 1 patch onto the skin daily. Remove & Discard patch within 12 hours or as directed by MD (Patient not taking: Reported on 08/03/2021)   No facility-administered encounter medications on file as of 09/12/2022.    Allergies (verified) Patient has no known allergies.   History: Past Medical History:  Diagnosis Date   Acute renal failure superimposed on stage 2 chronic  kidney disease ([place]) 06/12/2018   Alcohol abuse 10/18/2017   Allergy, unspecified, initial encounter 03/26/2021   Anaphylactic shock, unspecified, initial encounter 03/26/2021   Anemia in chronic kidney disease 03/26/2021   Anxiety and depression 10/06/2017   Arthritis    Atherosclerotic heart disease of native coronary artery without angina pectoris 03/26/2021   Bilateral lower extremity edema 06/11/2018   Bradycardia with 41-50 beats per minute 05/05/2021   CAD (coronary artery disease) 10/06/2017   CHF (congestive heart failure) ([place])    CKD (chronic kidney disease) 07/20/2018    CKD (chronic kidney disease) stage 5, GFR less than 15 ml/min (HCC)    dialysis T/Th/Sa   Complication of vascular dialysis catheter 03/26/2021   COPD (chronic obstructive pulmonary disease) ([place]) 09/29/2017   Coronary artery calcification seen on CT scan 10/09/2017   Cough    Diarrhea, unspecified 03/26/2021   Disorder of phosphorus metabolism, unspecified 07/28/2021   Dyspnea    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   Edema 07/20/2018   ESRD (end stage renal disease) ([place])    TTHSAT  [name]   Fever, unspecified 03/26/2021   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 Tremors whebn he moves it.   Shortness of breath 03/26/2021   Tobacco abuse 09/29/2017   Type 2 diabetes mellitus ([place]) 10/17/2011   diet controlled - per pt does not check CBG at home- insurance did not approve - 08/13/21   Type 2 diabetes mellitus with diabetic peripheral angiopathy without gangrene ([place]) 03/26/2021   Type 2 diabetes mellitus with other diabetic kidney complication ([place]) [id number]   Past Surgical History:  Procedure Laterality Date   A/V FISTULAGRAM Right 12/06/2021   Procedure: A/V Fistulagram;  Surgeon: Waynetta Sandy, MD;  Location: [facility];  Service: Cardiovascular;  Laterality: Right;   ANTERIOR CERVICAL DECOMP/DISCECTOMY FUSION N/A 09/14/2017   Procedure: ANTERIOR CERVICAL DECOMPRESSION/DISCECTOMY FUSIONCERVICAL 4- CERVICAL 5, CERVICAL 5- CERVICAL 6;  Surgeon: Kary Kos, MD;  Location:  [facility];  Service: Neurosurgery;  Laterality: N/A;  ANTERIOR CERVICAL DECOMPRESSION/DISCECTOMY FUSIONCERVICAL 4- CERVICAL 5, CERVICAL 5- CERVICAL 6   AV FISTULA PLACEMENT Left 03/05/2021   Procedure: LEFT UPPER EXTREMITY RADIOBASILIC  ARTERIOVENOUS (AV) FISTULA CREATION;  Surgeon: [name], MD;  Location: [facility];  Service: Vascular;  Laterality: Left;  PERIPHERAL NERVE BLOCK   AV FISTULA PLACEMENT Left 04/21/2021   Procedure: INSERTION OF  LEFT ARM ARTERIOVENOUS (AV) GORE-TEX GRAFT;  Surgeon: [name], MD;  Location: [facility];  Service: Vascular;  Laterality: Left;  PERIPHERAL NERVE BLOCK   AV FISTULA PLACEMENT Right 06/30/2021   Procedure: RIGHT ARM ARTERIOVENOUS (AV) FISTULA;  Surgeon: [name], MD;  Location: [facility];  Service: Vascular;  Laterality: Right;  PERIPHERAL NERVE BLOCK   [place] Right 08/16/2021   Procedure: RIGHT ARM SECOND STAGE BASILIC VEIN TRANSPOSITION;  Surgeon: [name], MD;  Location: [facility];  Service: Vascular;  Laterality: Right;  PERIPHERAL NERVE BLOCK, LOCAL GIVEN   IR FLUORO GUIDE CV LINE LEFT  03/25/2021  PERIPHERAL VASCULAR BALLOON ANGIOPLASTY Right 12/06/2021   Procedure: PERIPHERAL VASCULAR BALLOON ANGIOPLASTY;  Surgeon: Waynetta Sandy, MD;  Location: [facility];  Service: Cardiovascular;  Laterality: Right;   UPPER EXTREMITY VENOGRAPHY Bilateral 06/04/2021   Procedure: CENTRAL  AND UPPER VENOGRAPHY;  Surgeon: [name], MD;  Location: [facility];  Service: Cardiovascular;  Laterality: Bilateral;   Family History  Problem Relation Age of Onset   Diabetes Mother    Scoliosis Mother    Kidney cancer Father    Lung cancer Father    Hypertension Brother    Colon cancer Neg Hx    Pancreatic cancer Neg Hx    Prostate cancer Neg Hx    Rectal cancer Neg Hx    Stomach cancer Neg Hx    Social History   Socioeconomic History   Marital status: Single    Spouse name: Not on file   Number of  children: Not on file   Years of education: Not on file   Highest education level: Not on file  Occupational History   Not on file  Tobacco Use   Smoking status: Every Day    Packs/day: 1.50    Years: 42.00    Additional pack years: 0.00    Total pack years: 63.00    Types: Cigarettes   Smokeless tobacco: Never   Tobacco comments:    1 pack a day   Vaping Use   Vaping Use: Never used  Substance and Sexual Activity   Alcohol use: Not Currently    Alcohol/week: 10.0 standard drinks of alcohol    Types: 10 Cans of beer per week    Comment: quit September 2022   Drug use: Yes    Types: Marijuana    Comment: ocassionally   Sexual activity: Not on file  Other Topics Concern   Not on file  Social History Narrative   Not on file   Social Determinants of Health   Financial Resource Strain: Low Risk  (09/12/2022)   Overall Financial Resource Strain (CARDIA)    Difficulty of Paying Living Expenses: Not hard at all  Food Insecurity: No Food Insecurity (09/12/2022)   Hunger Vital Sign    Worried About Running Out of Food in the Last Year: Never true    Ran Out of Food in the Last Year: Never true  Transportation Needs: No Transportation Needs (09/12/2022)   PRAPARE - Hydrologist (Medical): No    Lack of Transportation (Non-Medical): No  Physical Activity: Inactive (09/12/2022)   Exercise Vital Sign    Days of Exercise per Week: 0 days    Minutes of Exercise per Session: 0 min  Stress: No Stress Concern Present (09/12/2022)   [place]    Feeling of Stress : Only a little  Social Connections: Moderately Isolated (09/03/2021)   Social Connection and Isolation Panel [NHANES]    Frequency of Communication with Friends and Family: More than three times a week    Frequency of Social Gatherings with Friends and Family: More than three times a week    Attends Religious Services: 1 to 4 times  per year    Active Member of [employer] or Organizations: No    Attends Archivist Meetings: Never    Marital Status: Divorced    Tobacco Counseling Ready to quit: Yes Counseling given: Not Answered Tobacco comments: 1 pack a day    Clinical Intake:  Pre-visit preparation completed: Yes  Pain : No/denies pain     Nutritional Status: BMI 25 -29 Overweight Nutritional Risks: Nausea/ vomitting/ diarrhea (diarrhea after dialysis) Diabetes: Yes  How often do you need to have someone help you when you read instructions, pamphlets, or other written materials from your doctor or pharmacy?: 1 - Never  Diabetic? Yes Nutrition Risk Assessment:  Has the patient had any N/V/D within the last 2 months?  No  Does the patient have any non-healing wounds?  No  Has the patient had any unintentional weight loss or weight gain?  No   Diabetes:  Is the patient diabetic?  Yes  If diabetic, was a CBG obtained today?  No  Did the patient bring in their glucometer from home?  No  How often do you monitor your CBG's? Does not.   Financial Strains and Diabetes Management:  Are you having any financial strains with the device, your supplies or your medication? No .  Does the patient want to be seen by Chronic Care Management for management of their diabetes?  No  Would the patient like to be referred to a Nutritionist or for Diabetic Management?  No   Diabetic Exams:  Diabetic Eye Exam: Overdue for diabetic eye exam. Pt has been advised about the importance in completing this exam. Patient advised to call and schedule an eye exam. Diabetic Foot Exam: Completed 08/15/2022   Interpreter Needed?: No  Information entered by :: NAllen LPN   Activities of Daily Living    09/12/2022    2:05 PM  In your present state of health, do you have any difficulty performing the following activities:  Hearing? 1  Vision? 1  Comment some blurriness, lost glasses  Difficulty concentrating or  making decisions? 0  Walking or climbing stairs? 0  Dressing or bathing? 0  Doing errands, shopping? 0  Preparing Food and eating ? N  Using the Toilet? N  In the past six months, have you accidently leaked urine? N  Do you have problems with loss of bowel control? N  Managing your Medications? N  Managing your Finances? N  Housekeeping or managing your Housekeeping? N    Patient Care Team: Charlott Rakes, MD as PCP - General (Family Medicine) [name], [facility]  Indicate any recent Medical Services you may have received from other than [facility] providers in the past year (date may be approximate).     Assessment:   This is a routine wellness examination for [facility].  Hearing/Vision screen Vision Screening - Comments:: No regular eye exams, [facility]  Dietary issues and exercise activities discussed: Current Exercise Habits: The patient does not participate in regular exercise at present   Goals Addressed             This Visit's Progress    Patient Stated       09/12/2022, quit smoking       Depression Screen    09/12/2022    2:05 PM 08/15/2022    8:46 AM 02/03/2022    8:49 AM 09/03/2021   11:20 AM 05/18/2021    9:05 AM 02/15/2021    9:55 AM 05/04/2020    3:23 PM  PHQ 2/9 Scores  PHQ - 2 Score 0 0 2 0 0 3 1  PHQ- 9 Score  3 10  0 7 1    Fall Risk    09/12/2022    2:05 PM 08/15/2022    8:38 AM 02/03/2022    8:45  AM 09/03/2021   11:12 AM 05/18/2021    8:48 AM  Fall Risk   Falls in the past year? 0 1 0 1 0  Comment    October 2022   Number falls in past yr: 0 1 0 1   Injury with Fall? 0 0 0 0   Risk for fall due to : Medication side effect;Impaired mobility;Impaired balance/gait History of fall(s)  Impaired balance/gait;Impaired mobility   Follow up Falls prevention discussed;Education provided;Falls evaluation completed   Falls evaluation completed     FALL RISK PREVENTION PERTAINING TO THE HOME:  Any stairs in or around the home? No   If so, are there any without handrails?  N/a Home free of loose throw rugs in walkways, pet beds, electrical cords, etc? Yes  Adequate lighting in your home to reduce risk of falls? Yes   ASSISTIVE DEVICES UTILIZED TO PREVENT FALLS:  Life alert? No  Use of a cane, walker or w/c? Yes  Grab bars in the bathroom? Yes  Shower chair or bench in shower? No  Elevated toilet seat or a handicapped toilet? No   TIMED UP AND GO:  Was the test performed? No .      Cognitive Function:        09/12/2022    2:07 PM 09/03/2021   11:14 AM  6CIT Screen  What Year? 0 points 0 points  What month? 0 points 0 points  What time? 0 points 0 points  Count back from 20 0 points 0 points  Months in reverse 4 points 0 points  Repeat phrase 0 points 0 points  Total Score 4 points 0 points    Immunizations Immunization History  Administered Date(s) Administered   Influenza, High Dose Seasonal PF 06/13/2018   Influenza-Unspecified 03/30/2021   Moderna Sars-Covid-2 Vaccination 09/18/2019, 10/16/2019   PNEUMOCOCCAL CONJUGATE-20 02/15/2021   Pneumococcal Polysaccharide-23 06/13/2018    TDAP status: Due, Education has been provided regarding the importance of this vaccine. Advised may receive this vaccine at local pharmacy or Health Dept. Aware to provide a copy of the vaccination record if obtained from local pharmacy or Health Dept. Verbalized acceptance and understanding.  Flu Vaccine status: Up to date  Pneumococcal vaccine status: Up to date  Covid-19 vaccine status: Completed vaccines  Qualifies for Shingles Vaccine? Yes   Zostavax completed No   Shingrix Completed?: No.    Education has been provided regarding the importance of this vaccine. Patient has been advised to call insurance company to determine out of pocket expense if they have not yet received this vaccine. Advised may also receive vaccine at local pharmacy or Health Dept. Verbalized acceptance and understanding.  Screening  Tests Health Maintenance  Topic Date Due   Zoster Vaccines- Shingrix (1 of 2) Never done   COVID-19 Vaccine (3 - Moderna risk series) 11/13/2019   Lung Cancer Screening  02/22/2022   OPHTHALMOLOGY EXAM  03/22/2022   Medicare Annual Wellness (AWV)  09/04/2022   HEMOGLOBIN A1C  02/13/2023   Fecal DNA (Cologuard)  02/19/2023   FOOT EXAM  08/16/2023   INFLUENZA VACCINE  Completed   Hepatitis C Screening  Completed   HIV Screening  Completed   HPV VACCINES  Aged Out   DTaP/Tdap/Td  Discontinued   COLONOSCOPY (Pts 45-21yrs Insurance coverage will need to be confirmed)  Discontinued    Health Maintenance  Health Maintenance Due  Topic Date Due   Zoster Vaccines- Shingrix (1 of 2) Never done   COVID-19 Vaccine (3 -  Moderna risk series) 11/13/2019   Lung Cancer Screening  02/22/2022   OPHTHALMOLOGY EXAM  03/22/2022   Medicare Annual Wellness (AWV)  09/04/2022    Colorectal cancer screening: Type of screening: Colonoscopy. Completed 06/18/2020. Repeat every 10 years  Lung Cancer Screening: (Low Dose CT Chest recommended if Age 59-80 years, 30 pack-year currently smoking OR have quit w/in 15years.) does not qualify.   Lung Cancer Screening Referral: no  Additional Screening:  Hepatitis C Screening: does qualify; Completed 07/10/2018  Vision Screening: Recommended annual ophthalmology exams for early detection of glaucoma and other disorders of the eye. Is the patient up to date with their annual eye exam?  No  Who is the provider or what is the name of the office in which the patient attends annual eye exams? [facility] If pt is not established with a provider, would they like to be referred to a provider to establish care? No .   Dental Screening: Recommended annual dental exams for proper oral hygiene  Community Resource Referral / Chronic Care Management: CRR required this visit?  No   CCM required this visit?  No      Plan:     I have personally reviewed and noted  the following in the patient's chart:   Medical and social history Use of alcohol, tobacco or illicit drugs  Current medications and supplements including opioid prescriptions. Patient is not currently taking opioid prescriptions. Functional ability and status Nutritional status Physical activity Advanced directives List of other physicians Hospitalizations, surgeries, and ER visits in previous 12 months Vitals Screenings to include cognitive, depression, and falls Referrals and appointments  In addition, I have reviewed and discussed with patient certain preventive protocols, quality metrics, and best practice recommendations. A written personalized care plan for preventive services as well as general preventive health recommendations were provided to patient.     Kellie Simmering, LPN   [id number]   Nurse Notes: none  Due to this being a virtual visit, the after visit summary with patients personalized plan was offered to patient via mail or my-chart. to pick up at office at next visit

## 2022-10-07 ENCOUNTER — Other Ambulatory Visit: Payer: Self-pay

## 2022-10-07 ENCOUNTER — Other Ambulatory Visit: Payer: Self-pay | Admitting: Cardiology

## 2022-10-07 MED ORDER — EZETIMIBE 10 MG PO TABS
10.0000 mg | ORAL_TABLET | Freq: Every day | ORAL | 0 refills | Status: DC
  Filled 2022-10-07: qty 15, 15d supply, fill #0

## 2022-10-27 ENCOUNTER — Other Ambulatory Visit: Payer: Self-pay

## 2022-10-27 MED ORDER — LOKELMA 10 G PO PACK
10.0000 g | PACK | ORAL | 3 refills | Status: DC
  Filled 2022-10-27: qty 24, 84d supply, fill #0

## 2022-10-28 ENCOUNTER — Other Ambulatory Visit: Payer: Self-pay

## 2022-11-03 ENCOUNTER — Other Ambulatory Visit: Payer: Self-pay

## 2022-11-29 ENCOUNTER — Other Ambulatory Visit: Payer: Self-pay

## 2022-11-29 MED ORDER — AMLODIPINE BESYLATE 5 MG PO TABS
5.0000 mg | ORAL_TABLET | Freq: Every evening | ORAL | 3 refills | Status: DC
  Filled 2022-11-29: qty 90, 90d supply, fill #0

## 2022-12-02 ENCOUNTER — Other Ambulatory Visit: Payer: Self-pay | Admitting: Family Medicine

## 2022-12-02 ENCOUNTER — Other Ambulatory Visit: Payer: Self-pay

## 2022-12-02 ENCOUNTER — Other Ambulatory Visit: Payer: Self-pay | Admitting: Cardiology

## 2022-12-02 DIAGNOSIS — G629 Polyneuropathy, unspecified: Secondary | ICD-10-CM

## 2022-12-02 MED ORDER — EZETIMIBE 10 MG PO TABS
10.0000 mg | ORAL_TABLET | Freq: Every day | ORAL | 1 refills | Status: DC
  Filled 2022-12-02: qty 90, 90d supply, fill #0
  Filled 2023-03-27: qty 90, 90d supply, fill #1

## 2022-12-02 NOTE — Telephone Encounter (Signed)
Requested medications are due for refill today.  yes  Requested medications are on the active medications list.  yes  Last refill. 08/15/2022 #30 1 rf  Future visit scheduled.   yes  Notes to clinic.  Refill not delegated.    Requested Prescriptions  Pending Prescriptions Disp Refills   pregabalin (LYRICA) 25 MG capsule 30 capsule 1    Sig: Take 1 capsule (25 mg total) by mouth at bedtime.     Not Delegated - Neurology:  Anticonvulsants - Controlled - pregabalin Failed - 12/02/2022 11:06 AM      Failed - This refill cannot be delegated      Failed - Cr in normal range and within 360 days    Creatinine, Ser  Date Value Ref Range Status  12/06/2021 9.50 (H) 0.61 - 1.24 mg/dL Final   Creatinine, Urine  Date Value Ref Range Status  06/12/2018 68.91 mg/dL Final    Comment:    Performed at [facility], [address], [place], [place] [address]         Passed - Completed PHQ-2 or PHQ-9 in the last 360 days      Passed - Valid encounter within last 12 months    Recent Outpatient Visits           3 months ago Type 2 diabetes mellitus with chronic kidney disease on chronic dialysis, without long-term current use of insulin (HCC)   [place] [facility] [place], [place], MD   10 months ago Type 2 diabetes mellitus with chronic kidney disease on chronic dialysis, without long-term current use of insulin (HCC)   [place] [facility] [place], [place], MD   1 year ago Type 2 diabetes mellitus with chronic kidney disease on chronic dialysis, without long-term current use of insulin (HCC)   [place] [facility] [place], Odette Horns, MD   1 year ago Type 2 diabetes mellitus with stage 5 chronic kidney disease not on chronic dialysis, without long-term current use of insulin (HCC)   [place] [facility] [place], Odette Horns, MD   2 years ago Fibrillary glomerulonephritis   [place]  [facility] Hoy Register, MD       Future Appointments             In 2 months Hoy Register, MD [facility]

## 2022-12-05 ENCOUNTER — Other Ambulatory Visit: Payer: Self-pay

## 2022-12-05 MED ORDER — PREGABALIN 25 MG PO CAPS
25.0000 mg | ORAL_CAPSULE | Freq: Every day | ORAL | 1 refills | Status: DC
  Filled 2022-12-05 – 2023-01-30 (×2): qty 30, 30d supply, fill #0
  Filled 2023-03-27: qty 30, 30d supply, fill #1

## 2022-12-06 ENCOUNTER — Other Ambulatory Visit: Payer: Self-pay

## 2022-12-13 ENCOUNTER — Other Ambulatory Visit: Payer: Self-pay

## 2022-12-19 ENCOUNTER — Other Ambulatory Visit: Payer: Self-pay

## 2022-12-19 MED ORDER — LISINOPRIL 5 MG PO TABS
5.0000 mg | ORAL_TABLET | Freq: Every day | ORAL | 3 refills | Status: DC
  Filled 2022-12-19: qty 90, 90d supply, fill #0

## 2022-12-20 ENCOUNTER — Other Ambulatory Visit: Payer: Self-pay

## 2022-12-20 MED ORDER — LISINOPRIL 10 MG PO TABS
10.0000 mg | ORAL_TABLET | Freq: Every day | ORAL | 3 refills | Status: DC
  Filled 2022-12-20: qty 90, 90d supply, fill #0

## 2022-12-26 ENCOUNTER — Other Ambulatory Visit: Payer: Self-pay

## 2023-01-19 ENCOUNTER — Other Ambulatory Visit: Payer: Self-pay

## 2023-01-19 MED ORDER — LISINOPRIL 10 MG PO TABS
10.0000 mg | ORAL_TABLET | Freq: Every day | ORAL | 3 refills | Status: DC
  Filled 2023-01-19 – 2023-01-30 (×2): qty 90, 90d supply, fill #0

## 2023-01-25 ENCOUNTER — Other Ambulatory Visit: Payer: Self-pay

## 2023-01-30 ENCOUNTER — Other Ambulatory Visit: Payer: Self-pay

## 2023-02-13 ENCOUNTER — Ambulatory Visit: Payer: Medicare Other | Admitting: Physician Assistant

## 2023-02-13 DIAGNOSIS — E1122 Type 2 diabetes mellitus with diabetic chronic kidney disease: Secondary | ICD-10-CM

## 2023-03-27 ENCOUNTER — Other Ambulatory Visit: Payer: Self-pay

## 2023-03-27 ENCOUNTER — Other Ambulatory Visit: Payer: Self-pay | Admitting: Family Medicine

## 2023-03-27 DIAGNOSIS — R569 Unspecified convulsions: Secondary | ICD-10-CM

## 2023-03-27 DIAGNOSIS — F419 Anxiety disorder, unspecified: Secondary | ICD-10-CM

## 2023-03-28 NOTE — Telephone Encounter (Signed)
Requested medications are due for refill today.  yes  Requested medications are on the active medications list.  yes  Last refill. 08/15/2022 for all 3   Future visit scheduled.   Yes - next year  Notes to clinic.  Labs are expired.     Requested Prescriptions  Pending Prescriptions Disp Refills   citalopram (CELEXA) 40 MG tablet 90 tablet 1    Sig: Take 1 tablet (40 mg total) by mouth daily.     Psychiatry:  Antidepressants - SSRI Failed - 03/27/2023 12:41 PM      Failed - Valid encounter within last 6 months    Recent Outpatient Visits           7 months ago Type 2 diabetes mellitus with chronic kidney disease on chronic dialysis, without long-term current use of insulin (HCC)   [facility] [place], [place], MD   1 year ago Type 2 diabetes mellitus with chronic kidney disease on chronic dialysis, without long-term current use of insulin (HCC)   [place] [facility] [place], [place], MD   1 year ago Type 2 diabetes mellitus with chronic kidney disease on chronic dialysis, without long-term current use of insulin (HCC)   [place] [place], Odette Horns, MD   2 years ago Type 2 diabetes mellitus with stage 5 chronic kidney disease not on chronic dialysis, without long-term current use of insulin (HCC)   [facility] [place], [place], MD   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     Ear, Nose, and Throat:  Antihistamines 2 Failed - 03/27/2023 12:41 PM      Failed - Cr in normal range and within 360 days    Creatinine, Ser  Date Value Ref Range Status  12/06/2021 9.50 (H) 0.61  - 1.24 mg/dL Final   Creatinine, Urine  Date Value Ref Range Status  06/12/2018 68.91 mg/dL Final    Comment:    Performed at [facility], [address], [place], [place] [address]         Passed - Valid encounter within last 12 months    Recent Outpatient Visits           7 months ago Type 2 diabetes mellitus with chronic kidney disease on chronic dialysis, without long-term current use of insulin (HCC)   [facility] [place], [place], MD   1 year ago Type 2 diabetes mellitus with chronic kidney disease on chronic dialysis, without long-term current use of insulin (HCC)   [place] [facility] [place], [place], MD   1 year ago Type 2 diabetes mellitus with chronic kidney disease on chronic dialysis, without long-term current use of insulin (HCC)   [facility] [place], Odette Horns, MD   2 years ago Type 2 diabetes mellitus with stage 5 chronic kidney disease not on chronic dialysis, without long-term current use of insulin [facility])   [place] [facility] [place], [place],  MD   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     Neurology:  Anticonvulsants - levetiracetam Failed - 03/27/2023 12:41 PM      Failed - Cr in normal range and within 360 days    Creatinine, Ser  Date Value Ref Range Status  12/06/2021 9.50 (H) 0.61 - 1.24 mg/dL Final   Creatinine, Urine  Date Value Ref Range Status  06/12/2018 68.91 mg/dL Final    Comment:    Performed at [facility], [address], [place], [place] [address]         Passed - Completed PHQ-2 or PHQ-9 in the last 360 days      Passed - Valid encounter within last 12 months    Recent Outpatient Visits            7 months ago Type 2 diabetes mellitus with chronic kidney disease on chronic dialysis, without long-term current use of insulin (HCC)   [facility] [place], [place], MD   1 year ago Type 2 diabetes mellitus with chronic kidney disease on chronic dialysis, without long-term current use of insulin (HCC)   [facility] [place], [place], MD   1 year ago Type 2 diabetes mellitus with chronic kidney disease on chronic dialysis, without long-term current use of insulin (HCC)   [place] [facility] [place], Odette Horns, MD   2 years ago Type 2 diabetes mellitus with stage 5 chronic kidney disease not on chronic dialysis, without long-term current use of insulin (HCC)   [facility] [place], Odette Horns, MD   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

## 2023-03-29 ENCOUNTER — Other Ambulatory Visit: Payer: Self-pay

## 2023-03-29 MED ORDER — CITALOPRAM HYDROBROMIDE 40 MG PO TABS
40.0000 mg | ORAL_TABLET | Freq: Every day | ORAL | 0 refills | Status: DC
Start: 2023-03-29 — End: 2023-08-21
  Filled 2023-03-29 – 2023-08-04 (×2): qty 30, 30d supply, fill #0

## 2023-03-29 MED ORDER — LEVETIRACETAM 500 MG PO TABS
500.0000 mg | ORAL_TABLET | Freq: Two times a day (BID) | ORAL | 0 refills | Status: DC
Start: 2023-03-29 — End: 2023-06-13
  Filled 2023-03-29: qty 60, 30d supply, fill #0

## 2023-03-29 MED ORDER — HYDROXYZINE HCL 25 MG PO TABS
25.0000 mg | ORAL_TABLET | Freq: Three times a day (TID) | ORAL | 0 refills | Status: DC | PRN
Start: 2023-03-29 — End: 2023-08-21
  Filled 2023-03-29: qty 90, 30d supply, fill #0

## 2023-03-30 ENCOUNTER — Other Ambulatory Visit: Payer: Self-pay

## 2023-04-09 ENCOUNTER — Emergency Department (HOSPITAL_COMMUNITY): Payer: Medicare Other

## 2023-04-09 ENCOUNTER — Other Ambulatory Visit: Payer: Self-pay

## 2023-04-09 ENCOUNTER — Emergency Department (HOSPITAL_COMMUNITY)
Admission: EM | Admit: 2023-04-09 | Discharge: 2023-04-09 | Disposition: A | Discharge status: Home / Self Care | Payer: Medicare Other | Attending: Emergency Medicine | Admitting: Emergency Medicine

## 2023-04-09 DIAGNOSIS — N186 End stage renal disease: Secondary | ICD-10-CM | POA: Insufficient documentation

## 2023-04-09 DIAGNOSIS — W01198A Fall on same level from slipping, tripping and stumbling with subsequent striking against other object, initial encounter: Secondary | ICD-10-CM | POA: Insufficient documentation

## 2023-04-09 DIAGNOSIS — S0083XA Contusion of other part of head, initial encounter: Secondary | ICD-10-CM | POA: Insufficient documentation

## 2023-04-09 DIAGNOSIS — S51011A Laceration without foreign body of right elbow, initial encounter: Secondary | ICD-10-CM | POA: Insufficient documentation

## 2023-04-09 DIAGNOSIS — W19XXXA Unspecified fall, initial encounter: Secondary | ICD-10-CM

## 2023-04-09 DIAGNOSIS — Z992 Dependence on renal dialysis: Secondary | ICD-10-CM | POA: Insufficient documentation

## 2023-04-09 LAB — COMPREHENSIVE METABOLIC PANEL
ALT: 15 U/L (ref 0–44)
AST: 15 U/L (ref 15–41)
Albumin: 3 g/dL — ABNORMAL LOW (ref 3.5–5.0)
Alkaline Phosphatase: 70 U/L (ref 38–126)
Anion gap: 13 (ref 5–15)
BUN: 20 mg/dL (ref 8–23)
CO2: 29 mmol/L (ref 22–32)
Calcium: 9.6 mg/dL (ref 8.9–10.3)
Chloride: 92 mmol/L — ABNORMAL LOW (ref 98–111)
Creatinine, Ser: 7.91 mg/dL — ABNORMAL HIGH (ref 0.61–1.24)
GFR, Estimated: 7 mL/min — ABNORMAL LOW (ref >60)
Glucose, Bld: 131 mg/dL — ABNORMAL HIGH (ref 70–99)
Potassium: 4 mmol/L (ref 3.5–5.1)
Sodium: 134 mmol/L — ABNORMAL LOW (ref 135–145)
Total Bilirubin: 0.4 mg/dL (ref 0.3–1.2)
Total Protein: 5.7 g/dL — ABNORMAL LOW (ref 6.5–8.1)

## 2023-04-09 LAB — CBC WITH DIFFERENTIAL/PLATELET
Abs Immature Granulocytes: 0.04 10*3/uL (ref 0.00–0.07)
Basophils Absolute: 0 10*3/uL (ref 0.0–0.1)
Basophils Relative: 1 %
Eosinophils Absolute: 0.2 10*3/uL (ref 0.0–0.5)
Eosinophils Relative: 2 %
HCT: 30.5 % — ABNORMAL LOW (ref 39.0–52.0)
Hemoglobin: 10.2 g/dL — ABNORMAL LOW (ref 13.0–17.0)
Immature Granulocytes: 1 %
Lymphocytes Relative: 19 %
Lymphs Abs: 1.6 10*3/uL (ref 0.7–4.0)
MCH: 30.7 pg (ref 26.0–34.0)
MCHC: 33.4 g/dL (ref 30.0–36.0)
MCV: 91.9 fL (ref 80.0–100.0)
Monocytes Absolute: 0.8 10*3/uL (ref 0.1–1.0)
Monocytes Relative: 10 %
Neutro Abs: 5.9 10*3/uL (ref 1.7–7.7)
Neutrophils Relative %: 67 %
Platelets: 231 10*3/uL (ref 150–400)
RBC: 3.32 MIL/uL — ABNORMAL LOW (ref 4.22–5.81)
RDW: 14.2 % (ref 11.5–15.5)
WBC: 8.6 10*3/uL (ref 4.0–10.5)
nRBC: 0 % (ref 0.0–0.2)

## 2023-04-09 LAB — I-STAT CHEM 8, ED
BUN: 21 mg/dL (ref 8–23)
Calcium, Ion: 1.15 mmol/L (ref 1.15–1.40)
Chloride: 92 mmol/L — ABNORMAL LOW (ref 98–111)
Creatinine, Ser: 8.6 mg/dL — ABNORMAL HIGH (ref 0.61–1.24)
Glucose, Bld: 130 mg/dL — ABNORMAL HIGH (ref 70–99)
HCT: 31 % — ABNORMAL LOW (ref 39.0–52.0)
Hemoglobin: 10.5 g/dL — ABNORMAL LOW (ref 13.0–17.0)
Potassium: 4 mmol/L (ref 3.5–5.1)
Sodium: 132 mmol/L — ABNORMAL LOW (ref 135–145)
TCO2: 29 mmol/L (ref 22–32)

## 2023-04-09 LAB — TROPONIN I (HIGH SENSITIVITY)
Troponin I (High Sensitivity): 10 ng/L (ref <18)
Troponin I (High Sensitivity): 16 ng/L (ref <18)

## 2023-04-09 MED ORDER — SODIUM CHLORIDE 0.9 % IV BOLUS
500.0000 mL | Freq: Once | INTRAVENOUS | Status: AC
  Administered 2023-04-09: 500 mL via INTRAVENOUS

## 2023-04-09 NOTE — Discharge Instructions (Signed)
As we discussed, your CT scans and xrays are unremarkable.   Stay hydrated   Go to dialysis as scheduled   See your doctor for follow up   Return to ER if you have worse dizziness or headache or vomiting

## 2023-04-09 NOTE — ED Provider Notes (Signed)
[place] EMERGENCY DEPARTMENT AT [facility] Provider Note   CSN: [id number] Arrival date & time: 04/09/23  1503     History  Chief Complaint  Patient presents with   [name] Emerald Moorefield Sr. is a 62 y.o. male history of ESRD on dialysis, presenting with fall.  Patient finished dialysis today.  He went to [employer] and felt lightheaded and dizzy and fell and hit his head.  He did not want to come to the ER at that point and apparently fell again.  Patient was noted to have abrasion of the right elbow.  Patient also has bruising of the nose as well.  Patient states that his tetanus is up-to-date.  The history is provided by the patient.       Home Medications Prior to Admission medications   Medication Sig Start Date End Date Taking? Authorizing Provider  Accu-Chek Softclix Lancets lancets Use as instructed tid before meals. Dx E11.22 08/15/22   Hoy Register, MD  albuterol (VENTOLIN HFA) 108 (90 Base) MCG/ACT inhaler INHALE 2 PUFFS INTO THE LUNGS EVERY 6 (SIX) HOURS AS NEEDED FOR WHEEZING OR SHORTNESS OF BREATH. 05/31/22   Hoy Register, MD  amLODipine (NORVASC) 5 MG tablet Take 1 tablet (5 mg total) by mouth Nightly. 06/14/22     amLODipine (NORVASC) 5 MG tablet Take 1 tablet (5 mg total) by mouth Nightly. 11/29/22     AURYXIA 1 GM 210 MG(Fe) tablet Take 420 mg by mouth 2 (two) times daily before a meal. 04/15/21   [provider]  Blood Glucose Monitoring Suppl (ACCU-CHEK GUIDE) w/Device KIT Use as directed 3 times daily. 08/15/22   Hoy Register, MD  citalopram (CELEXA) 40 MG tablet Take 1 tablet (40 mg total) by mouth daily.Must have office visit for refills 03/29/23   Hoy Register, MD  ezetimibe (ZETIA) 10 MG tablet Take 1 tablet (10 mg total) by mouth daily. 12/02/22   Revankar, Aundra Dubin, MD  fluticasone-salmeterol (ADVAIR) 250-50 MCG/ACT AEPB Inhale 1 puff into the lungs 2 (two) times daily. 02/03/22 02/03/23  Hoy Register, MD  glucose blood (ACCU-CHEK  GUIDE) test strip Use as instructed 3 times daily. Dx E11.22 08/15/22   Hoy Register, MD  hydrOXYzine (ATARAX) 25 MG tablet Take 1 tablet (25 mg total) by mouth 3 (three) times daily as needed for anxiety.Must have office visit for refills. 03/29/23   Hoy Register, MD  levETIRAcetam (KEPPRA) 500 MG tablet Take 1 tablet (500 mg total) by mouth 2 (two) times daily.Must have office visit for refills. 03/29/23   Hoy Register, MD  lidocaine (LIDODERM) 5 % Place 1 patch onto the skin daily. Remove & Discard patch within 12 hours or as directed by MD Patient not taking: Reported on 08/03/2021 02/15/21   Hoy Register, MD  lisinopril (ZESTRIL) 10 MG tablet Take 1 tablet (10 mg total) by mouth daily. 12/20/22     lisinopril (ZESTRIL) 10 MG tablet Take 1 tablet (10 mg total) by mouth daily. 01/19/23     lisinopril (ZESTRIL) 5 MG tablet Take 1 tablet (5 mg total) by mouth daily. 12/19/22     nicotine (NICODERM CQ) 14 mg/24hr patch Place 1 patch (14 mg total) onto the skin daily. 08/15/22   Hoy Register, MD  pregabalin (LYRICA) 25 MG capsule Take 1 capsule (25 mg total) by mouth at bedtime. 12/05/22   Hoy Register, MD  rosuvastatin (CRESTOR) 10 MG tablet Take 1 tablet (10 mg total) by mouth daily. 09/02/22  Hoy Register, MD  sodium zirconium cyclosilicate (LOKELMA) 10 g PACK packet Take 10 gram by mouth twice a week as directed on non-dialysis days (Wed and Sunday) 08/23/22     sodium zirconium cyclosilicate (LOKELMA) 10 g PACK packet Take 10 gram by mouth twice a week as directed on non-dialysis days (Wed and Sunday) 10/27/22         Allergies    Patient has no known allergies.    Review of Systems   Review of Systems  Skin:  Positive for wound.  All other systems reviewed and are negative.   Physical Exam Updated Vital Signs BP (!) 142/69 (BP Location: Left Arm)   Pulse 72   Temp 98.4 F (36.9 C) (Oral)   Resp 16   SpO2 92%  Physical Exam Vitals and nursing note reviewed.   Constitutional:      Appearance: Normal appearance.  HENT:     Head: Normocephalic.     Comments: Patient has abrasions of the face    Nose:     Comments: No obvious nasal bone tenderness but patient does have abrasions of the nasal bridge    Mouth/Throat:     Mouth: Mucous membranes are moist.  Eyes:     Extraocular Movements: Extraocular movements intact.     Pupils: Pupils are equal, round, and reactive to light.  Cardiovascular:     Rate and Rhythm: Normal rate and regular rhythm.     Pulses: Normal pulses.     Heart sounds: Normal heart sounds.  Pulmonary:     Effort: Pulmonary effort is normal.     Breath sounds: Normal breath sounds.  Abdominal:     General: Abdomen is flat.     Palpations: Abdomen is soft.  Musculoskeletal:     Cervical back: Normal range of motion and neck supple.     Comments: Abrasion and skin tear of the right elbow.  Normal range of motion of the elbow.  Dialysis graft in the right upper arm with good bruit and no active bleeding  Skin:    Capillary Refill: Capillary refill takes less than 2 seconds.  Neurological:     General: No focal deficit present.     Mental Status: He is alert and oriented to person, place, and time.  Psychiatric:        Mood and Affect: Mood normal.        Behavior: Behavior normal.     ED Results / Procedures / Treatments   Labs (all labs ordered are listed, but only abnormal results are displayed) Labs Reviewed  CBC WITH DIFFERENTIAL/PLATELET - Abnormal; Notable for the following components:      Result Value   RBC 3.32 (*)    Hemoglobin 10.2 (*)    HCT 30.5 (*)    All other components within normal limits  COMPREHENSIVE METABOLIC PANEL - Abnormal; Notable for the following components:   Sodium 134 (*)    Chloride 92 (*)    Glucose, Bld 131 (*)    Creatinine, Ser 7.91 (*)    Total Protein 5.7 (*)    Albumin 3.0 (*)    GFR, Estimated 7 (*)    All other components within normal limits  I-STAT CHEM 8, ED -  Abnormal; Notable for the following components:   Sodium 132 (*)    Chloride 92 (*)    Creatinine, Ser 8.60 (*)    Glucose, Bld 130 (*)    Hemoglobin 10.5 (*)    HCT 31.0 (*)  All other components within normal limits  TROPONIN I (HIGH SENSITIVITY)  TROPONIN I (HIGH SENSITIVITY)    EKG None  Radiology CT HEAD WO CONTRAST ( )  Result Date: 04/09/2023 CLINICAL DATA:  Head trauma, intracranial arterial injury suspected; Neck trauma, focal neuro deficit or paresthesia (Age 50-64y); Facial trauma, blunt. EXAM: CT HEAD WITHOUT CONTRAST CT MAXILLOFACIAL WITHOUT CONTRAST CT CERVICAL SPINE WITHOUT CONTRAST TECHNIQUE: Multidetector CT imaging of the head, cervical spine, and maxillofacial structures were performed using the standard protocol without intravenous contrast. Multiplanar CT image reconstructions of the cervical spine and maxillofacial structures were also generated. RADIATION DOSE REDUCTION: This exam was performed according to the departmental dose-optimization program which includes automated exposure control, adjustment of the mA and/or kV according to patient size and/or use of iterative reconstruction technique. COMPARISON:  Head CT 10/17/2017.  MRI cervical spine 09/09/2017. FINDINGS: CT HEAD FINDINGS Brain: No acute hemorrhage. Patchy hypoattenuation of the cerebral white matter, most consistent with mild chronic small-vessel disease. Cortical gray-white differentiation is preserved. No hydrocephalus or extra-axial collection. No mass effect or midline shift. Vascular: No hyperdense vessel or unexpected calcification. Skull: No calvarial fracture or suspicious bone lesion. Skull base is unremarkable. Other: Trace opacification of the right mastoid air cells. No nasopharyngeal mass. CT MAXILLOFACIAL FINDINGS Osseous: No fracture or mandibular dislocation. No destructive process. Orbits: Negative. No traumatic or inflammatory finding. Sinuses: Clear. Soft tissues: Unremarkable. CT  CERVICAL SPINE FINDINGS Alignment: Normal. Skull base and vertebrae: Postoperative changes from interval C4-C6 ACDF. Hardware is intact. No surrounding lucency. Osseous bridging across the intervening disc spacers. No acute fracture. Soft tissues and spinal canal: No prevertebral fluid or swelling. No visible canal hematoma. Disc levels: Right central disc-osteophyte complex at the adjacent C3-4 segment resulting in at least mild spinal canal stenosis. Upper chest: Pleural-parenchymal scarring in the lung apices. Other: Atherosclerotic calcifications of the carotid bulbs. IMPRESSION: 1. No acute intracranial abnormality. Mild chronic small-vessel disease. 2. No acute facial bone fracture. 3. No acute cervical spine fracture or traumatic listhesis. 4. Postoperative changes from interval C4-C6 ACDF without evidence of hardware complication. Right central disc-osteophyte complex at the adjacent C3-4 segment resulting in at least mild spinal canal stenosis. Electronically Signed   By: Orvan Falconer M.D.   On: 04/09/2023 17:23   CT Cervical Spine Wo Contrast  Result Date: 04/09/2023 CLINICAL DATA:  Head trauma, intracranial arterial injury suspected; Neck trauma, focal neuro deficit or paresthesia (Age 64-64y); Facial trauma, blunt. EXAM: CT HEAD WITHOUT CONTRAST CT MAXILLOFACIAL WITHOUT CONTRAST CT CERVICAL SPINE WITHOUT CONTRAST TECHNIQUE: Multidetector CT imaging of the head, cervical spine, and maxillofacial structures were performed using the standard protocol without intravenous contrast. Multiplanar CT image reconstructions of the cervical spine and maxillofacial structures were also generated. RADIATION DOSE REDUCTION: This exam was performed according to the departmental dose-optimization program which includes automated exposure control, adjustment of the mA and/or kV according to patient size and/or use of iterative reconstruction technique. COMPARISON:  Head CT 10/17/2017.  MRI cervical spine  09/09/2017. FINDINGS: CT HEAD FINDINGS Brain: No acute hemorrhage. Patchy hypoattenuation of the cerebral white matter, most consistent with mild chronic small-vessel disease. Cortical gray-white differentiation is preserved. No hydrocephalus or extra-axial collection. No mass effect or midline shift. Vascular: No hyperdense vessel or unexpected calcification. Skull: No calvarial fracture or suspicious bone lesion. Skull base is unremarkable. Other: Trace opacification of the right mastoid air cells. No nasopharyngeal mass. CT MAXILLOFACIAL FINDINGS Osseous: No fracture or mandibular dislocation. No destructive process. Orbits: Negative. No traumatic or  inflammatory finding. Sinuses: Clear. Soft tissues: Unremarkable. CT CERVICAL SPINE FINDINGS Alignment: Normal. Skull base and vertebrae: Postoperative changes from interval C4-C6 ACDF. Hardware is intact. No surrounding lucency. Osseous bridging across the intervening disc spacers. No acute fracture. Soft tissues and spinal canal: No prevertebral fluid or swelling. No visible canal hematoma. Disc levels: Right central disc-osteophyte complex at the adjacent C3-4 segment resulting in at least mild spinal canal stenosis. Upper chest: Pleural-parenchymal scarring in the lung apices. Other: Atherosclerotic calcifications of the carotid bulbs. IMPRESSION: 1. No acute intracranial abnormality. Mild chronic small-vessel disease. 2. No acute facial bone fracture. 3. No acute cervical spine fracture or traumatic listhesis. 4. Postoperative changes from interval C4-C6 ACDF without evidence of hardware complication. Right central disc-osteophyte complex at the adjacent C3-4 segment resulting in at least mild spinal canal stenosis. Electronically Signed   By: Orvan Falconer M.D.   On: 04/09/2023 17:23   CT Maxillofacial Wo Contrast  Result Date: 04/09/2023 CLINICAL DATA:  Head trauma, intracranial arterial injury suspected; Neck trauma, focal neuro deficit or paresthesia  (Age 15-64y); Facial trauma, blunt. EXAM: CT HEAD WITHOUT CONTRAST CT MAXILLOFACIAL WITHOUT CONTRAST CT CERVICAL SPINE WITHOUT CONTRAST TECHNIQUE: Multidetector CT imaging of the head, cervical spine, and maxillofacial structures were performed using the standard protocol without intravenous contrast. Multiplanar CT image reconstructions of the cervical spine and maxillofacial structures were also generated. RADIATION DOSE REDUCTION: This exam was performed according to the departmental dose-optimization program which includes automated exposure control, adjustment of the mA and/or kV according to patient size and/or use of iterative reconstruction technique. COMPARISON:  Head CT 10/17/2017.  MRI cervical spine 09/09/2017. FINDINGS: CT HEAD FINDINGS Brain: No acute hemorrhage. Patchy hypoattenuation of the cerebral white matter, most consistent with mild chronic small-vessel disease. Cortical gray-white differentiation is preserved. No hydrocephalus or extra-axial collection. No mass effect or midline shift. Vascular: No hyperdense vessel or unexpected calcification. Skull: No calvarial fracture or suspicious bone lesion. Skull base is unremarkable. Other: Trace opacification of the right mastoid air cells. No nasopharyngeal mass. CT MAXILLOFACIAL FINDINGS Osseous: No fracture or mandibular dislocation. No destructive process. Orbits: Negative. No traumatic or inflammatory finding. Sinuses: Clear. Soft tissues: Unremarkable. CT CERVICAL SPINE FINDINGS Alignment: Normal. Skull base and vertebrae: Postoperative changes from interval C4-C6 ACDF. Hardware is intact. No surrounding lucency. Osseous bridging across the intervening disc spacers. No acute fracture. Soft tissues and spinal canal: No prevertebral fluid or swelling. No visible canal hematoma. Disc levels: Right central disc-osteophyte complex at the adjacent C3-4 segment resulting in at least mild spinal canal stenosis. Upper chest: Pleural-parenchymal  scarring in the lung apices. Other: Atherosclerotic calcifications of the carotid bulbs. IMPRESSION: 1. No acute intracranial abnormality. Mild chronic small-vessel disease. 2. No acute facial bone fracture. 3. No acute cervical spine fracture or traumatic listhesis. 4. Postoperative changes from interval C4-C6 ACDF without evidence of hardware complication. Right central disc-osteophyte complex at the adjacent C3-4 segment resulting in at least mild spinal canal stenosis. Electronically Signed   By: Orvan Falconer M.D.   On: 04/09/2023 17:23   DG Elbow Complete Right  Result Date: 04/09/2023 CLINICAL DATA:  Fall with elbow pain. EXAM: RIGHT ELBOW - COMPLETE 3+ VIEW COMPARISON:  Right humerus radiographs dated 09/09/2017. FINDINGS: There is no evidence of fracture, dislocation, or joint effusion. There is no evidence of arthropathy or other focal bone abnormality. Soft tissues are unremarkable. IMPRESSION: Negative. Electronically Signed   By: Romona Curls M.D.   On: 04/09/2023 16:52    Procedures Procedures  Medications Ordered in ED Medications  sodium chloride 0.9 % bolus 500 mL (0 mLs Intravenous Stopped 04/09/23 1749)    ED Course/ Medical Decision Making/ A&P                                 Medical Decision Making [name] Schar Sr. is a 62 y.o. male here presenting with fall.  Patient fell and hit his head and face and also right elbow.  Patient also just finished dialysis and was feeling dizzy.  I wonder if he was over dialyzed and was slightly dehydrated.  Will check labs and give IV fluids and get CT head neck and face and right elbow x-rays.  6:10 PM I reviewed patient's labs and independently interpreted imaging studies.  Labs and x-rays unremarkable.  Patient's creatinine is baseline.  Potassium is normal.  Troponin negative x 2.  Stable for discharge.  Problems Addressed: Contusion of face, initial encounter: acute illness or injury Fall, initial encounter: acute illness  or injury Skin tear of right elbow without complication, initial encounter: acute illness or injury  Amount and/or Complexity of Data Reviewed Labs: ordered. Decision-making details documented in ED Course. Radiology: ordered and independent interpretation performed. Decision-making details documented in ED Course. ECG/medicine tests: ordered.    Final Clinical Impression(s) / ED Diagnoses Final diagnoses:  None    Rx / DC Orders ED Discharge Orders     None         Charlynne Pander, MD 04/09/23 1811

## 2023-04-09 NOTE — ED Triage Notes (Signed)
Pt arrived via [facility], pt was in [employer] parking lot and fell. Per EMS, pt has abrasion on cheek, bridge of nose, and right arm. Pt denies LOC. Pt not on blood thinners. Pt dialysis pt Tues, Thurs, Sat. Per EMS, pt stated he had been dizzy and weakx3d. Per EMS, pt refused to go with them at first and they told him he had to walk to his truck on his own, but had abnormal gait and fell again, but the EMT caught him that time.

## 2023-04-20 ENCOUNTER — Other Ambulatory Visit: Payer: Self-pay

## 2023-04-20 MED ORDER — LISINOPRIL 20 MG PO TABS
20.0000 mg | ORAL_TABLET | Freq: Every day | ORAL | 3 refills | Status: DC
  Filled 2023-04-20: qty 90, 90d supply, fill #0

## 2023-04-27 ENCOUNTER — Other Ambulatory Visit: Payer: Self-pay

## 2023-04-27 MED ORDER — AMLODIPINE BESYLATE 10 MG PO TABS
10.0000 mg | ORAL_TABLET | Freq: Every evening | ORAL | 3 refills | Status: DC
  Filled 2023-04-27: qty 90, 90d supply, fill #0

## 2023-04-27 MED ORDER — CARVEDILOL 6.25 MG PO TABS
6.2500 mg | ORAL_TABLET | Freq: Two times a day (BID) | ORAL | 3 refills | Status: DC
  Filled 2023-04-27: qty 180, 90d supply, fill #0

## 2023-05-02 ENCOUNTER — Other Ambulatory Visit: Payer: Self-pay

## 2023-05-05 ENCOUNTER — Other Ambulatory Visit: Payer: Self-pay

## 2023-05-09 ENCOUNTER — Emergency Department (HOSPITAL_COMMUNITY): Payer: Medicare Other

## 2023-05-09 ENCOUNTER — Encounter (HOSPITAL_COMMUNITY): Payer: Self-pay

## 2023-05-09 ENCOUNTER — Other Ambulatory Visit: Payer: Self-pay

## 2023-05-09 ENCOUNTER — Inpatient Hospital Stay (HOSPITAL_COMMUNITY)
Admission: EM | Admit: 2023-05-09 | Discharge: 2023-05-16 | DRG: 070 | Disposition: A | Discharge status: Skilled Nursing Facility | Payer: Medicare Other | Attending: Internal Medicine | Admitting: Internal Medicine

## 2023-05-09 ENCOUNTER — Observation Stay (HOSPITAL_COMMUNITY): Payer: Medicare Other

## 2023-05-09 DIAGNOSIS — E785 Hyperlipidemia, unspecified: Secondary | ICD-10-CM | POA: Diagnosis present

## 2023-05-09 DIAGNOSIS — Z91158 Patient's noncompliance with renal dialysis for other reason: Secondary | ICD-10-CM

## 2023-05-09 DIAGNOSIS — N2581 Secondary hyperparathyroidism of renal origin: Secondary | ICD-10-CM | POA: Diagnosis present

## 2023-05-09 DIAGNOSIS — I5021 Acute systolic (congestive) heart failure: Secondary | ICD-10-CM | POA: Diagnosis present

## 2023-05-09 DIAGNOSIS — E871 Hypo-osmolality and hyponatremia: Secondary | ICD-10-CM | POA: Diagnosis present

## 2023-05-09 DIAGNOSIS — Z801 Family history of malignant neoplasm of trachea, bronchus and lung: Secondary | ICD-10-CM

## 2023-05-09 DIAGNOSIS — Z992 Dependence on renal dialysis: Secondary | ICD-10-CM

## 2023-05-09 DIAGNOSIS — M898X9 Other specified disorders of bone, unspecified site: Secondary | ICD-10-CM | POA: Diagnosis present

## 2023-05-09 DIAGNOSIS — R0602 Shortness of breath: Secondary | ICD-10-CM | POA: Diagnosis not present

## 2023-05-09 DIAGNOSIS — Z833 Family history of diabetes mellitus: Secondary | ICD-10-CM

## 2023-05-09 DIAGNOSIS — G9341 Metabolic encephalopathy: Principal | ICD-10-CM | POA: Diagnosis present

## 2023-05-09 DIAGNOSIS — E1142 Type 2 diabetes mellitus with diabetic polyneuropathy: Secondary | ICD-10-CM | POA: Diagnosis present

## 2023-05-09 DIAGNOSIS — Z8051 Family history of malignant neoplasm of kidney: Secondary | ICD-10-CM

## 2023-05-09 DIAGNOSIS — Z7951 Long term (current) use of inhaled steroids: Secondary | ICD-10-CM

## 2023-05-09 DIAGNOSIS — D631 Anemia in chronic kidney disease: Secondary | ICD-10-CM | POA: Diagnosis present

## 2023-05-09 DIAGNOSIS — Z8249 Family history of ischemic heart disease and other diseases of the circulatory system: Secondary | ICD-10-CM

## 2023-05-09 DIAGNOSIS — F419 Anxiety disorder, unspecified: Secondary | ICD-10-CM | POA: Diagnosis present

## 2023-05-09 DIAGNOSIS — R296 Repeated falls: Secondary | ICD-10-CM | POA: Diagnosis present

## 2023-05-09 DIAGNOSIS — J9601 Acute respiratory failure with hypoxia: Secondary | ICD-10-CM | POA: Diagnosis present

## 2023-05-09 DIAGNOSIS — M1990 Unspecified osteoarthritis, unspecified site: Secondary | ICD-10-CM | POA: Diagnosis present

## 2023-05-09 DIAGNOSIS — N185 Chronic kidney disease, stage 5: Secondary | ICD-10-CM | POA: Diagnosis present

## 2023-05-09 DIAGNOSIS — M549 Dorsalgia, unspecified: Secondary | ICD-10-CM | POA: Diagnosis present

## 2023-05-09 DIAGNOSIS — E1122 Type 2 diabetes mellitus with diabetic chronic kidney disease: Secondary | ICD-10-CM | POA: Diagnosis present

## 2023-05-09 DIAGNOSIS — E119 Type 2 diabetes mellitus without complications: Secondary | ICD-10-CM

## 2023-05-09 DIAGNOSIS — E861 Hypovolemia: Secondary | ICD-10-CM | POA: Diagnosis not present

## 2023-05-09 DIAGNOSIS — Z79899 Other long term (current) drug therapy: Secondary | ICD-10-CM

## 2023-05-09 DIAGNOSIS — N186 End stage renal disease: Secondary | ICD-10-CM | POA: Diagnosis present

## 2023-05-09 DIAGNOSIS — G319 Degenerative disease of nervous system, unspecified: Secondary | ICD-10-CM | POA: Diagnosis present

## 2023-05-09 DIAGNOSIS — Z91148 Patient's other noncompliance with medication regimen for other reason: Secondary | ICD-10-CM

## 2023-05-09 DIAGNOSIS — I132 Hypertensive heart and chronic kidney disease with heart failure and with stage 5 chronic kidney disease, or end stage renal disease: Principal | ICD-10-CM | POA: Diagnosis present

## 2023-05-09 DIAGNOSIS — G9340 Encephalopathy, unspecified: Principal | ICD-10-CM

## 2023-05-09 DIAGNOSIS — J449 Chronic obstructive pulmonary disease, unspecified: Secondary | ICD-10-CM | POA: Diagnosis present

## 2023-05-09 DIAGNOSIS — G40909 Epilepsy, unspecified, not intractable, without status epilepticus: Secondary | ICD-10-CM | POA: Diagnosis present

## 2023-05-09 DIAGNOSIS — I2510 Atherosclerotic heart disease of native coronary artery without angina pectoris: Secondary | ICD-10-CM | POA: Diagnosis present

## 2023-05-09 DIAGNOSIS — F341 Dysthymic disorder: Secondary | ICD-10-CM | POA: Diagnosis present

## 2023-05-09 DIAGNOSIS — Z981 Arthrodesis status: Secondary | ICD-10-CM

## 2023-05-09 DIAGNOSIS — E1151 Type 2 diabetes mellitus with diabetic peripheral angiopathy without gangrene: Secondary | ICD-10-CM | POA: Diagnosis present

## 2023-05-09 DIAGNOSIS — F17213 Nicotine dependence, cigarettes, with withdrawal: Secondary | ICD-10-CM | POA: Diagnosis present

## 2023-05-09 LAB — GLUCOSE, CAPILLARY: Glucose-Capillary: 134 mg/dL — ABNORMAL HIGH (ref 70–99)

## 2023-05-09 LAB — CBC WITH DIFFERENTIAL/PLATELET
Abs Immature Granulocytes: 0.04 10*3/uL (ref 0.00–0.07)
Basophils Absolute: 0 10*3/uL (ref 0.0–0.1)
Basophils Relative: 0 %
Eosinophils Absolute: 0.1 10*3/uL (ref 0.0–0.5)
Eosinophils Relative: 1 %
HCT: 33.3 % — ABNORMAL LOW (ref 39.0–52.0)
Hemoglobin: 11.3 g/dL — ABNORMAL LOW (ref 13.0–17.0)
Immature Granulocytes: 0 %
Lymphocytes Relative: 12 %
Lymphs Abs: 1.3 10*3/uL (ref 0.7–4.0)
MCH: 30.9 pg (ref 26.0–34.0)
MCHC: 33.9 g/dL (ref 30.0–36.0)
MCV: 91 fL (ref 80.0–100.0)
Monocytes Absolute: 1.1 10*3/uL — ABNORMAL HIGH (ref 0.1–1.0)
Monocytes Relative: 10 %
Neutro Abs: 8.6 10*3/uL — ABNORMAL HIGH (ref 1.7–7.7)
Neutrophils Relative %: 77 %
Platelets: 248 10*3/uL (ref 150–400)
RBC: 3.66 MIL/uL — ABNORMAL LOW (ref 4.22–5.81)
RDW: 14.6 % (ref 11.5–15.5)
WBC: 11.1 10*3/uL — ABNORMAL HIGH (ref 4.0–10.5)
nRBC: 0 % (ref 0.0–0.2)

## 2023-05-09 LAB — ETHANOL: Alcohol, Ethyl (B): <10 mg/dL (ref <10)

## 2023-05-09 LAB — COMPREHENSIVE METABOLIC PANEL
ALT: 12 U/L (ref 0–44)
AST: 23 U/L (ref 15–41)
Albumin: 3.4 g/dL — ABNORMAL LOW (ref 3.5–5.0)
Alkaline Phosphatase: 58 U/L (ref 38–126)
Anion gap: 19 — ABNORMAL HIGH (ref 5–15)
BUN: 51 mg/dL — ABNORMAL HIGH (ref 8–23)
CO2: 24 mmol/L (ref 22–32)
Calcium: 9.5 mg/dL (ref 8.9–10.3)
Chloride: 90 mmol/L — ABNORMAL LOW (ref 98–111)
Creatinine, Ser: 16.11 mg/dL — ABNORMAL HIGH (ref 0.61–1.24)
GFR, Estimated: 3 mL/min — ABNORMAL LOW (ref >60)
Glucose, Bld: 127 mg/dL — ABNORMAL HIGH (ref 70–99)
Potassium: 5.1 mmol/L (ref 3.5–5.1)
Sodium: 133 mmol/L — ABNORMAL LOW (ref 135–145)
Total Bilirubin: 0.8 mg/dL (ref <1.2)
Total Protein: 6.2 g/dL — ABNORMAL LOW (ref 6.5–8.1)

## 2023-05-09 LAB — TROPONIN I (HIGH SENSITIVITY)
Troponin I (High Sensitivity): 149 ng/L (ref <18)
Troponin I (High Sensitivity): 164 ng/L (ref <18)

## 2023-05-09 LAB — PHOSPHORUS: Phosphorus: 3.9 mg/dL (ref 2.5–4.6)

## 2023-05-09 LAB — AMMONIA: Ammonia: 24 umol/L (ref 9–35)

## 2023-05-09 LAB — LIPASE, BLOOD: Lipase: 73 U/L — ABNORMAL HIGH (ref 11–51)

## 2023-05-09 LAB — CBG MONITORING, ED: Glucose-Capillary: 133 mg/dL — ABNORMAL HIGH (ref 70–99)

## 2023-05-09 LAB — HEPATITIS B SURFACE ANTIGEN: Hepatitis B Surface Ag: NONREACTIVE

## 2023-05-09 LAB — HEMOGLOBIN A1C
Hgb A1c MFr Bld: 6.1 % — ABNORMAL HIGH (ref 4.8–5.6)
Mean Plasma Glucose: 128.37 mg/dL

## 2023-05-09 MED ORDER — LIDOCAINE HCL (PF) 1 % IJ SOLN: 5.0000 mL | INTRAMUSCULAR | Status: DC | PRN

## 2023-05-09 MED ORDER — FOLIC ACID 1 MG PO TABS
1.0000 mg | ORAL_TABLET | Freq: Every day | ORAL | Status: DC
Start: 2023-05-09 — End: 2023-05-17
  Administered 2023-05-09 – 2023-05-16 (×8): 1 mg via ORAL
  Filled 2023-05-09 (×8): qty 1

## 2023-05-09 MED ORDER — NEPRO/CARBSTEADY PO LIQD
237.0000 mL | Freq: Two times a day (BID) | ORAL | Status: DC
  Administered 2023-05-10 – 2023-05-16 (×9): 237 mL via ORAL

## 2023-05-09 MED ORDER — ALBUTEROL SULFATE (2.5 MG/3ML) 0.083% IN NEBU
2.5000 mg | INHALATION_SOLUTION | Freq: Four times a day (QID) | RESPIRATORY_TRACT | Status: DC | PRN
Start: 2023-05-09 — End: 2023-05-09

## 2023-05-09 MED ORDER — NICOTINE 21 MG/24HR TD PT24
21.0000 mg | MEDICATED_PATCH | Freq: Once | TRANSDERMAL | Status: AC
  Administered 2023-05-09: 21 mg via TRANSDERMAL
  Filled 2023-05-09: qty 1

## 2023-05-09 MED ORDER — ALBUTEROL SULFATE (2.5 MG/3ML) 0.083% IN NEBU: 2.5000 mg | INHALATION_SOLUTION | Freq: Four times a day (QID) | RESPIRATORY_TRACT | Status: DC | PRN

## 2023-05-09 MED ORDER — CITALOPRAM HYDROBROMIDE 20 MG PO TABS
40.0000 mg | ORAL_TABLET | Freq: Every day | ORAL | Status: DC
  Administered 2023-05-09 – 2023-05-10 (×2): 40 mg via ORAL
  Filled 2023-05-09 (×2): qty 2

## 2023-05-09 MED ORDER — HEPARIN SODIUM (PORCINE) 1000 UNIT/ML DIALYSIS: 1000.0000 [IU] | INTRAMUSCULAR | Status: DC | PRN

## 2023-05-09 MED ORDER — ACETAMINOPHEN 500 MG PO TABS
1000.0000 mg | ORAL_TABLET | Freq: Three times a day (TID) | ORAL | Status: AC
  Administered 2023-05-09 – 2023-05-12 (×7): 1000 mg via ORAL
  Filled 2023-05-09 (×7): qty 2

## 2023-05-09 MED ORDER — ROSUVASTATIN CALCIUM 5 MG PO TABS
10.0000 mg | ORAL_TABLET | Freq: Every day | ORAL | Status: DC
  Administered 2023-05-09 – 2023-05-16 (×8): 10 mg via ORAL
  Filled 2023-05-09 (×8): qty 2

## 2023-05-09 MED ORDER — LORAZEPAM 2 MG/ML IJ SOLN
1.0000 mg | INTRAMUSCULAR | Status: DC | PRN
  Administered 2023-05-12: 1 mg via INTRAVENOUS
  Filled 2023-05-09: qty 1

## 2023-05-09 MED ORDER — HEPARIN SODIUM (PORCINE) 1000 UNIT/ML DIALYSIS: 1500.0000 [IU] | INTRAMUSCULAR | Status: DC | PRN

## 2023-05-09 MED ORDER — LEVETIRACETAM 500 MG PO TABS
500.0000 mg | ORAL_TABLET | Freq: Two times a day (BID) | ORAL | Status: DC
  Administered 2023-05-10 – 2023-05-16 (×14): 500 mg via ORAL
  Filled 2023-05-09 (×14): qty 1

## 2023-05-09 MED ORDER — OXYCODONE HCL 5 MG PO TABS
5.0000 mg | ORAL_TABLET | Freq: Four times a day (QID) | ORAL | Status: DC | PRN
  Administered 2023-05-12 – 2023-05-13 (×3): 5 mg via ORAL
  Filled 2023-05-09 (×3): qty 1

## 2023-05-09 MED ORDER — LORAZEPAM 1 MG PO TABS
1.0000 mg | ORAL_TABLET | ORAL | Status: DC | PRN
Start: 2023-05-09 — End: 2023-05-11
  Administered 2023-05-10: 1 mg via ORAL
  Administered 2023-05-10: 2 mg via ORAL
  Administered 2023-05-10: 1 mg via ORAL
  Administered 2023-05-10: 2 mg via ORAL
  Filled 2023-05-09 (×2): qty 1
  Filled 2023-05-09 (×2): qty 2

## 2023-05-09 MED ORDER — INSULIN ASPART 100 UNIT/ML IJ SOLN
0.0000 [IU] | Freq: Every day | INTRAMUSCULAR | Status: DC
  Administered 2023-05-13: 2 [IU] via SUBCUTANEOUS

## 2023-05-09 MED ORDER — NEPRO/CARBSTEADY PO LIQD
237.0000 mL | ORAL | Status: DC | PRN
Start: 2023-05-09 — End: 2023-05-10

## 2023-05-09 MED ORDER — ACETAMINOPHEN 325 MG PO TABS: 650.0000 mg | ORAL_TABLET | Freq: Four times a day (QID) | ORAL | Status: DC | PRN

## 2023-05-09 MED ORDER — THIAMINE HCL 100 MG/ML IJ SOLN: 100.0000 mg | Freq: Every day | INTRAMUSCULAR | Status: DC

## 2023-05-09 MED ORDER — HEPARIN SODIUM (PORCINE) 1000 UNIT/ML DIALYSIS
3000.0000 [IU] | Freq: Once | INTRAMUSCULAR | Status: AC
Start: 2023-05-09 — End: 2023-05-09
  Administered 2023-05-09: 3000 [IU] via INTRAVENOUS_CENTRAL
  Filled 2023-05-09: qty 3

## 2023-05-09 MED ORDER — FERRIC CITRATE 1 GM 210 MG(FE) PO TABS
420.0000 mg | ORAL_TABLET | Freq: Three times a day (TID) | ORAL | Status: DC
  Administered 2023-05-10 – 2023-05-16 (×15): 420 mg via ORAL
  Filled 2023-05-09 (×22): qty 2

## 2023-05-09 MED ORDER — AMLODIPINE BESYLATE 10 MG PO TABS
10.0000 mg | ORAL_TABLET | Freq: Every day | ORAL | Status: DC
  Administered 2023-05-10 – 2023-05-14 (×6): 10 mg via ORAL
  Filled 2023-05-09 (×6): qty 1

## 2023-05-09 MED ORDER — CALCITRIOL 0.5 MCG PO CAPS
0.7500 ug | ORAL_CAPSULE | ORAL | Status: DC
  Administered 2023-05-10 – 2023-05-11 (×2): 0.75 ug via ORAL
  Filled 2023-05-09 (×5): qty 1

## 2023-05-09 MED ORDER — PREGABALIN 25 MG PO CAPS
25.0000 mg | ORAL_CAPSULE | Freq: Every day | ORAL | Status: DC
  Administered 2023-05-10: 25 mg via ORAL
  Filled 2023-05-09: qty 1

## 2023-05-09 MED ORDER — ADULT MULTIVITAMIN W/MINERALS CH
1.0000 | ORAL_TABLET | Freq: Every day | ORAL | Status: DC
  Administered 2023-05-09 – 2023-05-16 (×8): 1 via ORAL
  Filled 2023-05-09 (×9): qty 1

## 2023-05-09 MED ORDER — EZETIMIBE 10 MG PO TABS
10.0000 mg | ORAL_TABLET | Freq: Every day | ORAL | Status: DC
  Administered 2023-05-09 – 2023-05-16 (×8): 10 mg via ORAL
  Filled 2023-05-09 (×8): qty 1

## 2023-05-09 MED ORDER — LIDOCAINE-PRILOCAINE 2.5-2.5 % EX CREA: 1.0000 | TOPICAL_CREAM | CUTANEOUS | Status: DC | PRN

## 2023-05-09 MED ORDER — CHLORHEXIDINE GLUCONATE CLOTH 2 % EX PADS
6.0000 | MEDICATED_PAD | Freq: Every day | CUTANEOUS | Status: DC
  Administered 2023-05-10 – 2023-05-15 (×5): 6 via TOPICAL

## 2023-05-09 MED ORDER — HYDRALAZINE HCL 20 MG/ML IJ SOLN: 10.0000 mg | Freq: Four times a day (QID) | INTRAMUSCULAR | Status: DC | PRN

## 2023-05-09 MED ORDER — LORAZEPAM 1 MG PO TABS
1.0000 mg | ORAL_TABLET | ORAL | Status: DC | PRN
Start: 2023-05-09 — End: 2023-05-12

## 2023-05-09 MED ORDER — ALBUTEROL SULFATE HFA 108 (90 BASE) MCG/ACT IN AERS: 2.0000 | INHALATION_SPRAY | Freq: Four times a day (QID) | RESPIRATORY_TRACT | Status: DC | PRN

## 2023-05-09 MED ORDER — HYDRALAZINE HCL 20 MG/ML IJ SOLN
10.0000 mg | Freq: Once | INTRAMUSCULAR | Status: AC
  Administered 2023-05-09: 10 mg via INTRAVENOUS
  Filled 2023-05-09: qty 1

## 2023-05-09 MED ORDER — ALTEPLASE 2 MG IJ SOLR: 2.0000 mg | Freq: Once | INTRAMUSCULAR | Status: DC | PRN

## 2023-05-09 MED ORDER — INSULIN ASPART 100 UNIT/ML IJ SOLN
0.0000 [IU] | Freq: Three times a day (TID) | INTRAMUSCULAR | Status: DC
  Administered 2023-05-12: 3 [IU] via SUBCUTANEOUS
  Administered 2023-05-12 – 2023-05-13 (×2): 1 [IU] via SUBCUTANEOUS
  Administered 2023-05-14 (×2): 2 [IU] via SUBCUTANEOUS
  Administered 2023-05-14: 1 [IU] via SUBCUTANEOUS
  Administered 2023-05-15: 2 [IU] via SUBCUTANEOUS
  Administered 2023-05-15: 1 [IU] via SUBCUTANEOUS
  Administered 2023-05-15 – 2023-05-16 (×2): 2 [IU] via SUBCUTANEOUS
  Administered 2023-05-16: 1 [IU] via SUBCUTANEOUS

## 2023-05-09 MED ORDER — HEPARIN SODIUM (PORCINE) 5000 UNIT/ML IJ SOLN
5000.0000 [IU] | Freq: Three times a day (TID) | INTRAMUSCULAR | Status: DC
  Administered 2023-05-09 – 2023-05-16 (×18): 5000 [IU] via SUBCUTANEOUS
  Filled 2023-05-09 (×18): qty 1

## 2023-05-09 MED ORDER — ANTICOAGULANT SODIUM CITRATE 4% (200MG/5ML) IV SOLN
5.0000 mL | Status: DC | PRN
Start: 2023-05-09 — End: 2023-05-10

## 2023-05-09 MED ORDER — THIAMINE MONONITRATE 100 MG PO TABS
100.0000 mg | ORAL_TABLET | Freq: Every day | ORAL | Status: DC
  Administered 2023-05-09 – 2023-05-16 (×8): 100 mg via ORAL
  Filled 2023-05-09 (×8): qty 1

## 2023-05-09 MED ORDER — LISINOPRIL 10 MG PO TABS
10.0000 mg | ORAL_TABLET | Freq: Every day | ORAL | Status: DC
  Administered 2023-05-09 – 2023-05-14 (×4): 10 mg via ORAL
  Filled 2023-05-09 (×6): qty 1

## 2023-05-09 MED ORDER — ACETAMINOPHEN 650 MG RE SUPP: 650.0000 mg | Freq: Four times a day (QID) | RECTAL | Status: DC | PRN

## 2023-05-09 MED ORDER — LORAZEPAM 2 MG/ML IJ SOLN
0.5000 mg | Freq: Once | INTRAMUSCULAR | Status: AC
  Administered 2023-05-09: 0.5 mg via INTRAVENOUS
  Filled 2023-05-09: qty 1

## 2023-05-09 MED ORDER — CARVEDILOL 6.25 MG PO TABS
6.2500 mg | ORAL_TABLET | Freq: Two times a day (BID) | ORAL | Status: DC
  Administered 2023-05-10 – 2023-05-16 (×13): 6.25 mg via ORAL
  Filled 2023-05-09 (×13): qty 1

## 2023-05-09 MED ORDER — AMLODIPINE BESYLATE 5 MG PO TABS: 5.0000 mg | ORAL_TABLET | Freq: Every evening | ORAL | Status: DC

## 2023-05-09 MED ORDER — PENTAFLUOROPROP-TETRAFLUOROETH EX AERO: 1.0000 | INHALATION_SPRAY | CUTANEOUS | Status: DC | PRN

## 2023-05-09 NOTE — ED Notes (Signed)
ED TO INPATIENT HANDOFF REPORT  ED Nurse Name and Phone #: Delorise Jackson [phone]  S Name/Age/Gender [name] Plan Sr. 62 y.o. male Room/Bed: 040C/040C  Code Status   Code Status: Full Code  Home/SNF/Other Home Patient oriented to: self place Is this baseline? No  Triage Complete: Triage complete  Chief Complaint Acute metabolic encephalopathy [G93.41]  Triage Note Pt BIB EMS. Pt has not been to dialysis since Thursday by choice. AMS for the past 3 weeks. Pt feels brain fog. Pt has not been able to urinate. Pt hypertensive. Pt is axox2 on arrival.    Allergies No Known Allergies  Level of Care/Admitting Diagnosis ED Disposition     ED Disposition  Admit   Condition  --   Comment  Hospital Area: [facility] [[id number]]  Level of Care: Telemetry Medical [104]  May place patient in observation at [facility] or [facility] if equivalent level of care is available:: No  Covid Evaluation: Asymptomatic - no recent exposure (last 10 days) testing not required  Diagnosis: Acute metabolic encephalopathy [[id number]]  Admitting Physician: Lorin Glass [[id number]]  Attending Physician: Lorin Glass [[id number]]          B Medical/Surgery History Past Medical History:  Diagnosis Date   Acute renal failure superimposed on stage 2 chronic kidney disease (HCC) 06/12/2018   Alcohol abuse 10/18/2017   Allergy, unspecified, initial encounter 03/26/2021   Anaphylactic shock, unspecified, initial encounter 03/26/2021   Anemia in chronic kidney disease 03/26/2021   Anxiety and depression 10/06/2017   Arthritis    Atherosclerotic heart disease of native coronary artery without angina pectoris 03/26/2021   Bilateral lower extremity edema 06/11/2018   Bradycardia with 41-50 beats per minute 05/05/2021   CAD (coronary artery disease) 10/06/2017   CHF (congestive heart failure) (HCC)    CKD (chronic kidney disease) 07/20/2018   CKD (chronic kidney disease) stage 5, GFR less than  15 ml/min (HCC)    dialysis T/Th/Sa   Complication of vascular dialysis catheter 03/26/2021   COPD (chronic obstructive pulmonary disease) (HCC) 09/29/2017   Coronary artery calcification seen on CT scan 10/09/2017   Cough    Diarrhea, unspecified 03/26/2021   Disorder of phosphorus metabolism, unspecified 07/28/2021   Dyspnea    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   Edema 07/20/2018   ESRD (end stage renal disease) (HCC)    TTHSAT  [name]   Fever, unspecified 03/26/2021   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 Tremors whebn he moves it.   Shortness of breath 03/26/2021   Tobacco abuse 09/29/2017   Type 2 diabetes mellitus (HCC) 10/17/2011   diet controlled - per pt does not check CBG at home- insurance did not approve - 08/13/21   Type 2 diabetes mellitus with diabetic peripheral angiopathy without gangrene (HCC) 03/26/2021   Type 2 diabetes mellitus with other diabetic kidney complication (HCC) 03/26/2021   Past Surgical History:  Procedure Laterality Date   A/V FISTULAGRAM Right 12/06/2021   Procedure: A/V Fistulagram;  Surgeon: Maeola Harman, MD;  Location:  [facility] IN[facility]ASIVE CV [facility];  Service: Cardiovascular;  Laterality: Right;   ANTERIOR CERVICAL DECOMP/DISCECTOMY FUSION N/A 09/14/2017   Procedure: ANTERIOR CERVICAL DECOMPRESSION/DISCECTOMY FUSIONCERVICAL 4- CERVICAL 5, CERVICAL 5- CERVICAL 6;  Surgeon: Donalee Citrin, MD;  Location: [facility] OR;  Service: Neurosurgery;  Laterality: N/A;   ANTERIOR CERVICAL DECOMPRESSION/DISCECTOMY FUSIONCERVICAL 4- CERVICAL 5, CERVICAL 5- CERVICAL 6   AV FISTULA PLACEMENT Left 03/05/2021   Procedure: LEFT UPPER EXTREMITY RADIOBASILIC  ARTERIOVENOUS (AV) FISTULA CREATION;  Surgeon: Leonie Douglas, MD;  Location: [facility] OR;  Service: Vascular;  Laterality: Left;  PERIPHERAL NERVE BLOCK   AV FISTULA PLACEMENT Left 04/21/2021   Procedure: INSERTION OF  LEFT ARM ARTERIOVENOUS (AV) GORE-TEX GRAFT;  Surgeon: Leonie Douglas, MD;  Location: [facility] OR;  Service: Vascular;  Laterality: Left;  PERIPHERAL NERVE BLOCK   AV FISTULA PLACEMENT Right 06/30/2021   Procedure: RIGHT ARM ARTERIOVENOUS (AV) FISTULA;  Surgeon: Leonie Douglas, MD;  Location: [facility] OR;  Service: Vascular;  Laterality: Right;  PERIPHERAL NERVE BLOCK   BASCILIC VEIN TRANSPOSITION Right 08/16/2021   Procedure: RIGHT ARM SECOND STAGE BASILIC VEIN TRANSPOSITION;  Surgeon: Leonie Douglas, MD;  Location: [facility] OR;  Service: Vascular;  Laterality: Right;  PERIPHERAL NERVE BLOCK, LOCAL GIVEN   IR FLUORO GUIDE CV LINE LEFT  03/25/2021   PERIPHERAL VASCULAR BALLOON ANGIOPLASTY Right 12/06/2021   Procedure: PERIPHERAL VASCULAR BALLOON ANGIOPLASTY;  Surgeon: Maeola Harman, MD;  Location: [facility] INVASIVE CV LAB;  Service: Cardiovascular;  Laterality: Right;   UPPER EXTREMITY VENOGRAPHY Bilateral 06/04/2021   Procedure: CENTRAL  AND UPPER VENOGRAPHY;  Surgeon: Leonie Douglas, MD;  Location: [facility];  Service: Cardiovascular;  Laterality: Bilateral;     A IV Location/Drains/Wounds Patient Lines/Drains/Airways Status     Active Line/Drains/Airways     Name Placement date Placement time Site Days   Peripheral IV 05/09/23 22 G Anterior;Left;Proximal Forearm 05/09/23  1115  Forearm  less than 1   Fistula / Graft Left Upper arm Arteriovenous fistula 03/05/21  1125  Upper arm  795   Fistula / Graft Left Upper arm 04/21/21  1200  Upper arm  748   Fistula / Graft Right Upper arm Arteriovenous fistula  06/30/21  0923  Upper arm  678   Hemodialysis Catheter Right Internal jugular Double lumen Permanent (Tunneled) 03/25/21  1456  Internal jugular  775            Intake/Output Last 24 hours No intake or output data in the 24 hours ending 05/09/23 1549  Labs/Imaging Results for orders placed or performed during the hospital encounter of 05/09/23 (from the past 48 hour(s))  CBG monitoring, ED     Status: Abnormal   Collection Time: 05/09/23 10:56 AM  Result Value Ref Range   Glucose-Capillary 133 (H) 70 - 99 mg/dL    Comment: Glucose reference range applies only to samples taken after fasting for at least 8 hours.  CBC with Differential     Status: Abnormal   Collection Time: 05/09/23 11:08 AM  Result Value Ref Range   WBC 11.1 (H) 4.0 - 10.5 K/uL   RBC 3.66 (L) 4.22 - 5.81 MIL/uL   Hemoglobin 11.3 (L) 13.0 - 17.0 g/dL   HCT 62.9 (L) 52.8 - 41.3 %   MCV 91.0 80.0 - 100.0 fL   MCH 30.9 26.0 - 34.0 pg   MCHC 33.9 30.0 - 36.0 g/dL   RDW 24.4 01.0 - 27.2 %   Platelets 248 150 - 400 K/uL  nRBC 0.0 0.0 - 0.2 %   Neutrophils Relative % 77 %   Neutro Abs 8.6 (H) 1.7 - 7.7 K/uL   Lymphocytes Relative 12 %   Lymphs Abs 1.3 0.7 - 4.0 K/uL   Monocytes Relative 10 %   Monocytes Absolute 1.1 (H) 0.1 - 1.0 K/uL   Eosinophils Relative 1 %   Eosinophils Absolute 0.1 0.0 - 0.5 K/uL   Basophils Relative 0 %   Basophils Absolute 0.0 0.0 - 0.1 K/uL   Immature Granulocytes 0 %   Abs Immature Granulocytes 0.04 0.00 - 0.07 K/uL    Comment: Performed at [facility], [address], [place], [place] [address]  Comprehensive metabolic panel     Status: Abnormal   Collection Time: 05/09/23 11:08 AM  Result Value Ref Range   Sodium 133 (L) 135 - 145 mmol/L   Potassium 5.1 3.5 - 5.1 mmol/L   Chloride 90 (L) 98 - 111 mmol/L   CO2 24 22 - 32 mmol/L   Glucose, Bld 127 (H) 70 - 99 mg/dL    Comment: Glucose reference range applies only to samples taken after fasting for at least 8 hours.    BUN 51 (H) 8 - 23 mg/dL   Creatinine, Ser 65.78 (H) 0.61 - 1.24 mg/dL   Calcium 9.5 8.9 - 46.9 mg/dL   Total Protein 6.2 (L) 6.5 - 8.1 g/dL   Albumin 3.4 (L) 3.5 - 5.0 g/dL   AST 23 15 - 41 U/L   ALT 12 0 - 44 U/L   Alkaline Phosphatase 58 38 - 126 U/L   Total Bilirubin 0.8 <1.2 mg/dL   GFR, Estimated 3 (L) >60 mL/min    Comment: (NOTE) Calculated using the CKD-EPI Creatinine Equation (2021)    Anion gap 19 (H) 5 - 15    Comment: Performed at [facility], [address] [place], [place] [address]  Lipase, blood     Status: Abnormal   Collection Time: 05/09/23 11:08 AM  Result Value Ref Range   Lipase 73 (H) 11 - 51 U/L    Comment: Performed at [facility], [address] [place], [place] [address]  Troponin I (High Sensitivity)     Status: Abnormal   Collection Time: 05/09/23 11:08 AM  Result Value Ref Range   Troponin I (High Sensitivity) 149 (HH) <18 ng/L    Comment: CRITICAL RESULT CALLED TO, READ BACK BY AND VERIFIED WITH C. BAIN, RN AT 1216 11.12.24 D. BLU (NOTE) Elevated high sensitivity troponin I (hsTnI) values and significant  changes across serial measurements may suggest ACS but many other  chronic and acute conditions are known to elevate hsTnI results.  Refer to the "Links" section for chest pain algorithms and additional  guidance. Performed at [facility], [address] [place], [place] [address]   Hemoglobin A1c     Status: Abnormal   Collection Time: 05/09/23 11:08 AM  Result Value Ref Range   Hgb A1c MFr Bld 6.1 (H) 4.8 - 5.6 %    Comment: (NOTE) Pre diabetes:          5.7%-6.4%  Diabetes:              >6.4%  Glycemic control for   <7.0% adults with diabetes    Mean Plasma Glucose 128.37 mg/dL    Comment: Performed at [facility], [address], [place], [place] [address]  Ethanol     Status: None  Collection Time: 05/09/23 11:10 AM  Result Value Ref Range   Alcohol, Ethyl (B) <10 <10 mg/dL    Comment:  (NOTE) Lowest detectable limit for serum alcohol is 10 mg/dL.  For medical purposes only. Performed at [facility], [address], [place], [place] [address]   Ammonia     Status: None   Collection Time: 05/09/23 11:26 AM  Result Value Ref Range   Ammonia 24 9 - 35 umol/L    Comment: Performed at [facility], [address] [place], [place] [address]  Troponin I (High Sensitivity)     Status: Abnormal   Collection Time: 05/09/23  1:11 PM  Result Value Ref Range   Troponin I (High Sensitivity) 164 (HH) <18 ng/L    Comment: CRITICAL VALUE NOTED. VALUE IS CONSISTENT WITH PREVIOUSLY REPORTED/CALLED VALUE (NOTE) Elevated high sensitivity troponin I (hsTnI) values and significant  changes across serial measurements may suggest ACS but many other  chronic and acute conditions are known to elevate hsTnI results.  Refer to the "Links" section for chest pain algorithms and additional  guidance. Performed at [facility], [address], [place], [place] [address]    DG Chest Portable 1 View  Result Date: 05/09/2023 CLINICAL DATA:  Weakness altered mental status EXAM: PORTABLE CHEST 1 VIEW COMPARISON:  06/12/2018, chest CT 02/22/2021 FINDINGS: Borderline cardiac enlargement. Aortic atherosclerosis. Mild asymmetric ground-glass opacity in the right thorax. Mild airspace disease at the left lung base. Possible tiny effusions. IMPRESSION: Mild asymmetric ground-glass opacity in the right thorax with more focal airspace disease at left lung base, favor infection. Possible tiny effusions. Electronically Signed   By: Jasmine Pang M.D.   On: 05/09/2023 15:25   CT Head Wo Contrast  Result Date: 05/09/2023 CLINICAL DATA:  Mental status change of unknown cause. Agitation. Dialysis patient. EXAM: CT HEAD WITHOUT CONTRAST TECHNIQUE: Contiguous axial images were obtained from the base of the skull through the vertex without intravenous contrast. RADIATION DOSE REDUCTION: This exam  was performed according to the departmental dose-optimization program which includes automated exposure control, adjustment of the mA and/or kV according to patient size and/or use of iterative reconstruction technique. COMPARISON:  04/09/2023 FINDINGS: Brain: No acute finding. Chronic small-vessel ischemic changes of the white matter as seen previously. No sign of acute infarction, mass lesion, hemorrhage, hydrocephalus or extra-axial collection. Vascular: There is atherosclerotic calcification of the major vessels at the base of the brain. Skull: Negative Sinuses/Orbits: Clear/normal Other: None IMPRESSION: No acute CT finding. Chronic small-vessel ischemic changes of the white matter. Electronically Signed   By: Paulina Fusi M.D.   On: 05/09/2023 13:33    Pending Labs Unresulted Labs (From admission, onward)     Start     Ordered   05/10/23 0500  Basic metabolic panel  Tomorrow morning,   R        05/09/23 1430   05/10/23 0500  CBC  Tomorrow morning,   R        05/09/23 1430   05/10/23 0500  HIV Antibody (routine testing w rflx)  (HIV Antibody (Routine testing w reflex) panel)  Once,   R        05/09/23 1433   05/09/23 1526  Hepatitis B surface antigen  (New Admission Hemo Labs (Hepatitis B))  Once,   R        05/09/23 1529   05/09/23 1526  Hepatitis B surface antibody,quantitative  (New Admission Hemo Labs (Hepatitis B))  Once,  R        05/09/23 1529   05/09/23 1523  Phosphorus  Add-on,   AD        05/09/23 1522            Vitals/Pain Today's Vitals   05/09/23 1200 05/09/23 1230 05/09/23 1345 05/09/23 1515  BP: (!) 189/104 (!) 198/97 (!) 198/112 (!) 195/103  Pulse: 96 (!) 109 (!) 106 (!) 101  Resp: 17 (!) 33    Temp:    98 F (36.7 C)  TempSrc:      SpO2: 96% 92% 98% 98%  Height:      PainSc:        Isolation Precautions No active isolations  Medications Medications  nicotine (NICODERM CQ - dosed in mg/24 hours) patch 21 mg (has no administration in time range)   LORazepam (ATIVAN) injection 0.5 mg (has no administration in time range)  insulin aspart (novoLOG) injection 0-9 Units (has no administration in time range)  insulin aspart (novoLOG) injection 0-5 Units (has no administration in time range)  hydrALAZINE (APRESOLINE) injection 10 mg (has no administration in time range)  heparin injection 5,000 Units (has no administration in time range)  LORazepam (ATIVAN) injection 1 mg (has no administration in time range)  ferric citrate (AURYXIA) tablet 420 mg (has no administration in time range)  calcitRIOL (ROCALTROL) capsule 0.75 mcg (has no administration in time range)  Chlorhexidine Gluconate Cloth 2 % PADS 6 each (has no administration in time range)  LORazepam (ATIVAN) tablet 1-4 mg (has no administration in time range)    Or  LORazepam (ATIVAN) tablet 1 mg (has no administration in time range)  thiamine (VITAMIN B1) tablet 100 mg (has no administration in time range)    Or  thiamine (VITAMIN B1) injection 100 mg (has no administration in time range)  folic acid (FOLVITE) tablet 1 mg (has no administration in time range)  multivitamin with minerals tablet 1 tablet (has no administration in time range)  amLODipine (NORVASC) tablet 5 mg (has no administration in time range)  carvedilol (COREG) tablet 6.25 mg (has no administration in time range)  citalopram (CELEXA) tablet 40 mg (has no administration in time range)  ezetimibe (ZETIA) tablet 10 mg (has no administration in time range)  levETIRAcetam (KEPPRA) tablet 500 mg (has no administration in time range)  lisinopril (ZESTRIL) tablet 10 mg (has no administration in time range)  pregabalin (LYRICA) capsule 25 mg (has no administration in time range)  rosuvastatin (CRESTOR) tablet 10 mg (has no administration in time range)  acetaminophen (TYLENOL) tablet 1,000 mg (has no administration in time range)  oxyCODONE (Oxy IR/ROXICODONE) immediate release tablet 5 mg (has no administration in time  range)  hydrALAZINE (APRESOLINE) injection 10 mg (10 mg Intravenous Given 05/09/23 1243)    Mobility walks with person assist     Focused Assessments     R Recommendations: See Admitting Provider Note  Report given to:   Additional Notes:

## 2023-05-09 NOTE — ED Notes (Signed)
Pt ambulatory to bathroom

## 2023-05-09 NOTE — ED Notes (Signed)
MD notified on Troponin result.

## 2023-05-09 NOTE — ED Notes (Signed)
Heading to MRI

## 2023-05-09 NOTE — ED Provider Notes (Signed)
[place] EMERGENCY DEPARTMENT AT [facility] Provider Note   CSN: [id number] Arrival date & time: 05/09/23  1046     History  Chief Complaint  Patient presents with   Altered Mental Status    [name] Parrill Sr. is a 62 y.o. male.  Patient here with what he states is may be brain fog for the last couple weeks.  He has not felt like himself.  He will have speech issues at times.  Chest pain at times.  He has chronic kidney disease on hemodialysis and has missed his last 2 sessions of dialysis.  He lives with his mother.  He admits to smoking marijuana occasionally but no alcohol or drug use otherwise.  Has been compliant with his medications for the most part otherwise.  He has a history of CAD CHF alcohol abuse.  He denies any fever cough chills or sputum production.  Does not make any urine anymore.  Basically he feels like he is not quite himself but does not know why.  He denies any weakness or numbness or tingling otherwise.  No vision changes.  The history is provided by the patient.       Home Medications Prior to Admission medications   Medication Sig Start Date End Date Taking? Authorizing Provider  Accu-Chek Softclix Lancets lancets Use as instructed tid before meals. Dx E11.22 08/15/22   Hoy Register, MD  albuterol (VENTOLIN HFA) 108 (90 Base) MCG/ACT inhaler INHALE 2 PUFFS INTO THE LUNGS EVERY 6 (SIX) HOURS AS NEEDED FOR WHEEZING OR SHORTNESS OF BREATH. 05/31/22   Hoy Register, MD  amLODipine (NORVASC) 10 MG tablet Take 1 tablet (10 mg total) by mouth Nightly. 04/27/23     amLODipine (NORVASC) 5 MG tablet Take 1 tablet (5 mg total) by mouth Nightly. 06/14/22     amLODipine (NORVASC) 5 MG tablet Take 1 tablet (5 mg total) by mouth Nightly. 11/29/22     AURYXIA 1 GM 210 MG(Fe) tablet Take 420 mg by mouth 2 (two) times daily before a meal. 04/15/21   [provider]  Blood Glucose Monitoring Suppl (ACCU-CHEK GUIDE) w/Device KIT Use as directed 3 times  daily. 08/15/22   Hoy Register, MD  carvedilol (COREG) 6.25 MG tablet Take 1 tablet by mouth twice a day 04/27/23     citalopram (CELEXA) 40 MG tablet Take 1 tablet (40 mg total) by mouth daily.Must have office visit for refills 03/29/23   Hoy Register, MD  ezetimibe (ZETIA) 10 MG tablet Take 1 tablet (10 mg total) by mouth daily. 12/02/22   Revankar, Aundra Dubin, MD  fluticasone-salmeterol (ADVAIR) 250-50 MCG/ACT AEPB Inhale 1 puff into the lungs 2 (two) times daily. 02/03/22 02/03/23  Hoy Register, MD  glucose blood (ACCU-CHEK GUIDE) test strip Use as instructed 3 times daily. Dx E11.22 08/15/22   Hoy Register, MD  hydrOXYzine (ATARAX) 25 MG tablet Take 1 tablet (25 mg total) by mouth 3 (three) times daily as needed for anxiety.Must have office visit for refills. 03/29/23   Hoy Register, MD  levETIRAcetam (KEPPRA) 500 MG tablet Take 1 tablet (500 mg total) by mouth 2 (two) times daily.Must have office visit for refills. 03/29/23   Hoy Register, MD  lisinopril (ZESTRIL) 10 MG tablet Take 1 tablet (10 mg total) by mouth daily. 12/20/22     lisinopril (ZESTRIL) 10 MG tablet Take 1 tablet (10 mg total) by mouth daily. 01/19/23     lisinopril (ZESTRIL) 20 MG tablet Take 1 tablet by mouth once a  day 04/20/23     lisinopril (ZESTRIL) 5 MG tablet Take 1 tablet (5 mg total) by mouth daily. 12/19/22     nicotine (NICODERM CQ) 14 mg/24hr patch Place 1 patch (14 mg total) onto the skin daily. 08/15/22   Hoy Register, MD  pregabalin (LYRICA) 25 MG capsule Take 1 capsule (25 mg total) by mouth at bedtime. 12/05/22   Hoy Register, MD  rosuvastatin (CRESTOR) 10 MG tablet Take 1 tablet (10 mg total) by mouth daily. 09/02/22   Hoy Register, MD  sodium zirconium cyclosilicate (LOKELMA) 10 g PACK packet Take 10 gram by mouth twice a week as directed on non-dialysis days (Wed and Sunday) 08/23/22     sodium zirconium cyclosilicate (LOKELMA) 10 g PACK packet Take 10 gram by mouth twice a week as directed on  non-dialysis days (Wed and Sunday) 10/27/22         Allergies    Patient has no known allergies.    Review of Systems   Review of Systems  Physical Exam Updated Vital Signs BP (!) 198/112   Pulse (!) 106   Temp 98.1 F (36.7 C) (Oral)   Resp (!) 33   Ht 6\' 2"  (1.88 m)   SpO2 98%   BMI 27.22 kg/m  Physical Exam Vitals and nursing note reviewed.  Constitutional:      General: He is not in acute distress.    Appearance: He is well-developed. He is not ill-appearing.  HENT:     Head: Normocephalic and atraumatic.     Nose: Nose normal.     Mouth/Throat:     Mouth: Mucous membranes are moist.  Eyes:     Extraocular Movements: Extraocular movements intact.     Conjunctiva/sclera: Conjunctivae normal.     Pupils: Pupils are equal, round, and reactive to light.  Cardiovascular:     Rate and Rhythm: Normal rate and regular rhythm.     Pulses: Normal pulses.     Heart sounds: Normal heart sounds. No murmur heard. Pulmonary:     Effort: Pulmonary effort is normal. No respiratory distress.     Breath sounds: Normal breath sounds.  Abdominal:     Palpations: Abdomen is soft.     Tenderness: There is no abdominal tenderness.  Musculoskeletal:        General: No swelling.     Cervical back: Normal range of motion and neck supple.  Skin:    General: Skin is warm and dry.     Capillary Refill: Capillary refill takes less than 2 seconds.  Neurological:     General: No focal deficit present.     Mental Status: He is alert and oriented to person, place, and time.     Cranial Nerves: No cranial nerve deficit.     Sensory: No sensory deficit.     Motor: No weakness.     Coordination: Coordination normal.     Comments: Speech for the most part is fluent, maybe has some issues getting words out every so often but he follows commands got normal strength and sensation throughout, no drift, no cranial nerve issues, normal finger-to-nose finger  Psychiatric:        Mood and Affect: Mood  normal.     ED Results / Procedures / Treatments   Labs (all labs ordered are listed, but only abnormal results are displayed) Labs Reviewed  CBC WITH DIFFERENTIAL/PLATELET - Abnormal; Notable for the following components:      Result Value   WBC 11.1 (*)  RBC 3.66 (*)    Hemoglobin 11.3 (*)    HCT 33.3 (*)    Neutro Abs 8.6 (*)    Monocytes Absolute 1.1 (*)    All other components within normal limits  COMPREHENSIVE METABOLIC PANEL - Abnormal; Notable for the following components:   Sodium 133 (*)    Chloride 90 (*)    Glucose, Bld 127 (*)    BUN 51 (*)    Creatinine, Ser 16.11 (*)    Total Protein 6.2 (*)    Albumin 3.4 (*)    GFR, Estimated 3 (*)    Anion gap 19 (*)    All other components within normal limits  LIPASE, BLOOD - Abnormal; Notable for the following components:   Lipase 73 (*)    All other components within normal limits  CBG MONITORING, ED - Abnormal; Notable for the following components:   Glucose-Capillary 133 (*)    All other components within normal limits  TROPONIN I (HIGH SENSITIVITY) - Abnormal; Notable for the following components:   Troponin I (High Sensitivity) 149 (*)    All other components within normal limits  AMMONIA  ETHANOL  TROPONIN I (HIGH SENSITIVITY)    EKG EKG Interpretation Date/Time:  Tuesday May 09 2023 10:53:43 EST Ventricular Rate:  103 PR Interval:  186 QRS Duration:  100 QT Interval:  376 QTC Calculation: 493 R Axis:   69  Text Interpretation: Sinus tachycardia Confirmed by Virgina Norfolk [phone]) on 05/09/2023 10:55:08 AM  Radiology CT Head Wo Contrast  Result Date: 05/09/2023 CLINICAL DATA:  Mental status change of unknown cause. Agitation. Dialysis patient. EXAM: CT HEAD WITHOUT CONTRAST TECHNIQUE: Contiguous axial images were obtained from the base of the skull through the vertex without intravenous contrast. RADIATION DOSE REDUCTION: This exam was performed according to the departmental  dose-optimization program which includes automated exposure control, adjustment of the mA and/or kV according to patient size and/or use of iterative reconstruction technique. COMPARISON:  04/09/2023 FINDINGS: Brain: No acute finding. Chronic small-vessel ischemic changes of the white matter as seen previously. No sign of acute infarction, mass lesion, hemorrhage, hydrocephalus or extra-axial collection. Vascular: There is atherosclerotic calcification of the major vessels at the base of the brain. Skull: Negative Sinuses/Orbits: Clear/normal Other: None IMPRESSION: No acute CT finding. Chronic small-vessel ischemic changes of the white matter. Electronically Signed   By: Paulina Fusi M.D.   On: 05/09/2023 13:33    Procedures Procedures    Medications Ordered in ED Medications  nicotine (NICODERM CQ - dosed in mg/24 hours) patch 21 mg (has no administration in time range)  LORazepam (ATIVAN) injection 0.5 mg (has no administration in time range)  hydrALAZINE (APRESOLINE) injection 10 mg (10 mg Intravenous Given 05/09/23 1243)    ED Course/ Medical Decision Making/ A&P                                 Medical Decision Making Amount and/or Complexity of Data Reviewed Labs: ordered. Radiology: ordered.  Risk OTC drugs. Prescription drug management. Decision regarding hospitalization.   [employer] Sr. is here with altered mental status, confusion.  He states this has been ongoing maybe for a couple weeks.  He is missed his last 2 sessions of dialysis.  He has a history of diabetes, high blood pressure, CKD on dialysis.  Denies any infectious symptoms.  Denies any recent alcohol use or trauma or falls.  He admits to occasional  marijuana use.  Also has history of seizures denies any recent seizure activity.  No new medications otherwise.  He just has not felt quite like himself these last few weeks.  He notices some speech issues at times and chest pain at times.  Neurologically he appears to  be intact on exam.  Sometimes it does seem like he having a hard time getting words out.  He looks disheveled.  He lives with his mom.  Overall I am not sure if there is any emergent process going on but will do broad workup with labs chest x-ray head CT ammonia level.  He is not having any abdominal pain.  No current chest pain.  He has missed some dialysis sessions but he does not appear to be in any respiratory distress.  EKG shows sinus rhythm.  No ischemic changes.  Will look for any metabolic process or other process to see but this could be functional or mental health related.  But he denies any HI or SI.  I have written him for hydralazine for his high blood pressure.  Per my review and interpretation of labs, there is no significant leukocytosis or anemia.  Ammonia is normal.  Alcohol level is unremarkable.  Creatinine elevated above his baseline to 16.  BUN is 51.  Troponin is 149.  CT scan of his head is unremarkable per radiology report.  Overall I think his confusion is coming from missing some dialysis sessions here recently.  I talked with Dr. Arta Silence with nephrology who will help arrange for dialysis.  Will need to trend troponin but I suspect that this is in the setting of volume overload, needing dialysis.  Overall we will admit to hospital for further care.  I gave him Ativan and nicotine patch to help with some anxiety.  This chart was dictated using voice recognition software.  Despite best efforts to proofread,  errors can occur which can change the documentation meaning.         Final Clinical Impression(s) / ED Diagnoses Final diagnoses:  Encephalopathy, unspecified type    Rx / DC Orders ED Discharge Orders     None         Virgina Norfolk, DO 05/09/23 1422

## 2023-05-09 NOTE — Consult Note (Signed)
Renal Service Consult Note [facility]  Dantonio Wayland Sr. 05/09/2023 Maree Krabbe, MD Requesting Physician: Dr Pola Corn  Reason for Consult: ESRD pt w/ missed HD w/ AMS HPI: The patient is a 62 y.o. year-old w/ PMH as below who presented to ED brought by EMS for AMS. Pt missed Sat and today's HD "on purpose". Pt c/o SOB as well. In ED BUN 51, creat 16, K+ 5.1.  CXR w/ pulm edema. Hb 11, wbc 11.  Pt to be admitted for uncont HTN and AMS. We are asked to see for dialysis.   Dtr provides most hx. Has been confused and tremulous for 1-2 days. Lives w/ his wife, smokes cigarettes. Stopped drinking sometime ago. Pt doesn't provide much good hx.   ROS - denies CP, no joint pain, no HA, no blurry vision, no rash, no n/v/d   Past Medical History  Past Medical History:  Diagnosis Date   Acute renal failure superimposed on stage 2 chronic kidney disease (HCC) 06/12/2018   Alcohol abuse 10/18/2017   Allergy, unspecified, initial encounter 03/26/2021   Anaphylactic shock, unspecified, initial encounter 03/26/2021   Anemia in chronic kidney disease 03/26/2021   Anxiety and depression 10/06/2017   Arthritis    Atherosclerotic heart disease of native coronary artery without angina pectoris 03/26/2021   Bilateral lower extremity edema 06/11/2018   Bradycardia with 41-50 beats per minute 05/05/2021   CAD (coronary artery disease) 10/06/2017   CHF (congestive heart failure) (HCC)    CKD (chronic kidney disease) 07/20/2018   CKD (chronic kidney disease) stage 5, GFR less than 15 ml/min (HCC)    dialysis T/Th/Sa   Complication of vascular dialysis catheter 03/26/2021   COPD (chronic obstructive pulmonary disease) (HCC) 09/29/2017   Coronary artery calcification seen on CT scan 10/09/2017   Cough    Diarrhea, unspecified 03/26/2021   Disorder of phosphorus metabolism, unspecified 07/28/2021   Dyspnea    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   Edema 07/20/2018   ESRD (end stage renal  disease) (HCC)    TTHSAT  [name]   Fever, unspecified 03/26/2021   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 Tremors whebn he moves it.   Shortness of breath 03/26/2021   Tobacco abuse 09/29/2017   Type 2 diabetes mellitus (HCC) 10/17/2011   diet controlled - per pt does not check CBG at home- insurance did not approve - 08/13/21   Type 2 diabetes mellitus with diabetic peripheral angiopathy without gangrene (HCC) 03/26/2021   Type 2 diabetes mellitus with other diabetic kidney complication (HCC) 03/26/2021   Past Surgical History  Past Surgical History:  Procedure Laterality Date   A/V FISTULAGRAM Right 12/06/2021   Procedure: A/V Fistulagram;  Surgeon: Maeola Harman, MD;  [facility] INVASIVE CV LAB;  Service: Cardiovascular;  Laterality: Right;   ANTERIOR CERVICAL DECOMP/DISCECTOMY FUSION N/A 09/14/2017   Procedure: ANTERIOR CERVICAL DECOMPRESSION/DISCECTOMY FUSIONCERVICAL 4- CERVICAL 5, CERVICAL 5- CERVICAL 6;  Surgeon: Donalee Citrin, MD;  Location: [facility] OR;  Service: Neurosurgery;  Laterality: N/A;  ANTERIOR CERVICAL  DECOMPRESSION/DISCECTOMY FUSIONCERVICAL 4- CERVICAL 5, CERVICAL 5- CERVICAL 6   AV FISTULA PLACEMENT Left 03/05/2021   Procedure: LEFT UPPER EXTREMITY RADIOBASILIC  ARTERIOVENOUS (AV) FISTULA CREATION;  Surgeon: Leonie Douglas, MD;  Location: [facility] OR;  Service: Vascular;  Laterality: Left;  PERIPHERAL NERVE BLOCK   AV FISTULA PLACEMENT Left 04/21/2021   Procedure: INSERTION  OF  LEFT ARM ARTERIOVENOUS (AV) GORE-TEX GRAFT;  Surgeon: Leonie Douglas, MD;  Location: [facility] OR;  Service: Vascular;  Laterality: Left;  PERIPHERAL NERVE BLOCK   AV FISTULA PLACEMENT Right 06/30/2021   Procedure: RIGHT ARM ARTERIOVENOUS (AV) FISTULA;  Surgeon: Leonie Douglas, MD;  Location: [facility] OR;  Service: Vascular;  Laterality: Right;  PERIPHERAL NERVE BLOCK   BASCILIC VEIN TRANSPOSITION Right 08/16/2021   Procedure: RIGHT ARM SECOND STAGE BASILIC VEIN TRANSPOSITION;  Surgeon: Leonie Douglas, MD;  Location: [facility] OR;  Service: Vascular;  Laterality: Right;  PERIPHERAL NERVE BLOCK, LOCAL GIVEN   IR FLUORO GUIDE CV LINE LEFT  03/25/2021   PERIPHERAL VASCULAR BALLOON ANGIOPLASTY Right 12/06/2021   Procedure: PERIPHERAL VASCULAR BALLOON ANGIOPLASTY;  Surgeon: Maeola Harman, MD;  Location: [facility] INVASIVE CV LAB;  Service: Cardiovascular;  Laterality: Right;   UPPER EXTREMITY VENOGRAPHY Bilateral 06/04/2021   Procedure: CENTRAL  AND UPPER VENOGRAPHY;  Surgeon: Leonie Douglas, MD;  Location: [facility] INVASIVE CV LAB;  Service: Cardiovascular;  Laterality: Bilateral;   Family History  Family History  Problem Relation Age of Onset   Diabetes Mother    Scoliosis Mother    Kidney cancer Father    Lung cancer Father    Hypertension Brother    Colon cancer Neg Hx    Pancreatic cancer Neg Hx    Prostate cancer Neg Hx    Rectal cancer Neg Hx    Stomach cancer Neg Hx    Social History  reports that he has been smoking cigarettes. He has a 63 pack-year smoking history. He has never used smokeless tobacco. He reports that he does not currently use alcohol after a past usage of about 10.0 standard drinks of alcohol per week. He reports current drug use. Drug: Marijuana. Allergies No Known Allergies Home medications Prior to Admission medications   Medication Sig Start Date End Date Taking? Authorizing Provider  Accu-Chek Softclix Lancets lancets Use as instructed tid before meals. Dx E11.22 08/15/22    Hoy Register, MD  albuterol (VENTOLIN HFA) 108 (90 Base) MCG/ACT inhaler INHALE 2 PUFFS INTO THE LUNGS EVERY 6 (SIX) HOURS AS NEEDED FOR WHEEZING OR SHORTNESS OF BREATH. 05/31/22   Hoy Register, MD  amLODipine (NORVASC) 10 MG tablet Take 1 tablet (10 mg total) by mouth Nightly. 04/27/23     amLODipine (NORVASC) 5 MG tablet Take 1 tablet (5 mg total) by mouth Nightly. 06/14/22     amLODipine (NORVASC) 5 MG tablet Take 1 tablet (5 mg total) by mouth Nightly. 11/29/22     AURYXIA 1 GM 210 MG(Fe) tablet Take 420 mg by mouth 2 (two) times daily before a meal. 04/15/21   [provider]  Blood Glucose Monitoring Suppl (ACCU-CHEK GUIDE) w/Device KIT Use as directed 3 times daily. 08/15/22   Hoy Register, MD  carvedilol (COREG) 6.25 MG tablet Take 1 tablet by mouth twice a day 04/27/23     citalopram (CELEXA) 40 MG tablet Take 1 tablet (40 mg total) by mouth daily.Must have office visit for refills 03/29/23   Hoy Register, MD  ezetimibe (ZETIA) 10 MG tablet Take 1 tablet (10 mg total)  by mouth daily. 12/02/22   Revankar, Aundra Dubin, MD  fluticasone-salmeterol (ADVAIR) 250-50 MCG/ACT AEPB Inhale 1 puff into the lungs 2 (two) times daily. 02/03/22 02/03/23  Hoy Register, MD  glucose blood (ACCU-CHEK GUIDE) test strip Use as instructed 3 times daily. Dx E11.22 08/15/22   Hoy Register, MD  hydrOXYzine (ATARAX) 25 MG tablet Take 1 tablet (25 mg total) by mouth 3 (three) times daily as needed for anxiety.Must have office visit for refills. 03/29/23   Hoy Register, MD  levETIRAcetam (KEPPRA) 500 MG tablet Take 1 tablet (500 mg total) by mouth 2 (two) times daily.Must have office visit for refills. 03/29/23   Hoy Register, MD  lisinopril (ZESTRIL) 10 MG tablet Take 1 tablet (10 mg total) by mouth daily. 12/20/22     lisinopril (ZESTRIL) 10 MG tablet Take 1 tablet (10 mg total) by mouth daily. 01/19/23     lisinopril (ZESTRIL) 20 MG tablet Take 1 tablet by mouth once a day 04/20/23     lisinopril  (ZESTRIL) 5 MG tablet Take 1 tablet (5 mg total) by mouth daily. 12/19/22     nicotine (NICODERM CQ) 14 mg/24hr patch Place 1 patch (14 mg total) onto the skin daily. 08/15/22   Hoy Register, MD  pregabalin (LYRICA) 25 MG capsule Take 1 capsule (25 mg total) by mouth at bedtime. 12/05/22   Hoy Register, MD  rosuvastatin (CRESTOR) 10 MG tablet Take 1 tablet (10 mg total) by mouth daily. 09/02/22   Hoy Register, MD  sodium zirconium cyclosilicate (LOKELMA) 10 g PACK packet Take 10 gram by mouth twice a week as directed on non-dialysis days (Wed and Sunday) 08/23/22     sodium zirconium cyclosilicate (LOKELMA) 10 g PACK packet Take 10 gram by mouth twice a week as directed on non-dialysis days (Wed and Sunday) 10/27/22        Vitals:   05/09/23 1115 05/09/23 1200 05/09/23 1230 05/09/23 1345  BP: (!) 194/96 (!) 189/104 (!) 198/97 (!) 198/112  Pulse: 94 96 (!) 109 (!) 106  Resp: 12 17 (!) 33   Temp:      TempSrc:      SpO2: 96% 96% 92% 98%  Height:       Exam Gen alert, [place] O2,  no distress, a bit tremulous, pleasant No rash, cyanosis or gangrene Sclera anicteric, throat clear  No jvd or bruits Chest clear bilat to bases, no rales/ wheezing RRR no RG Abd soft ntnd no mass or ascites +bs GU normal male MS no joint effusions or deformity Ext trace bilat pretib edema, no wounds or ulcers Neuro is alert, Ox 2, nf    RUA AVF+bruit       Renal-related home meds: - norvasc 5- 10 mg  - auryxia 2 ac tid - coreg 6.25 bid - lisinopril 5- 20 mg every day - lyrica 25 hs - lokelma 10gm wed and sundays    OP HD: TTS G-O  4h   400/800   87.5kg  400/800   RUA AVF  Heparin 4000 - last OP HD 11/07, post wt 87kg - get's to his dry wt and about 0.5kg under most sessions last 3 wks - rocaltrol 0.75 mcg - mircera 30 mcg IV q 4, last 10/31, next 11/28    Assessment/ Plan: Acute hypoxic resp failure - w/ pulm edema on CXR, missed HD. Suspect vol overload. Is not on O2 at home. Not in  distress. Will plan on HD later this evening.  AMS - creat 16  which is high for him, but no asterixis. See if he improves w/ hd ESKD - on HD TTS. Last HD 11/07. As above.  HTN - poorly controlled. Is getting IV meds here in ED. Get volume down w/ HD tonight.  Volume - no LE edema, but missed HD and CXR suggested edema as above.  Anemia of eskd - Hb 11, next esa due on 11/28. Follow.  MBD ckd - CCa in range, add on phos. Cont binders and po vdra.       Vinson Moselle  MD [facility] 05/09/2023, 3:08 PM  Recent Labs  Lab 05/09/23 1108  HGB 11.3*  ALBUMIN 3.4*  CALCIUM 9.5  CREATININE 16.11*  K 5.1   Inpatient medications:  heparin  5,000 Units Subcutaneous Q8H   insulin aspart  0-5 Units Subcutaneous QHS   insulin aspart  0-9 Units Subcutaneous TID WC   LORazepam  0.5 mg Intravenous Once   nicotine  21 mg Transdermal Once    acetaminophen **OR** acetaminophen, albuterol, hydrALAZINE

## 2023-05-09 NOTE — H&P (Addendum)
[facility] History and Physical  [employer] Sr. WGN:[id number] DOB: May 15, 1961 DOA: 05/09/2023 PCP: Hoy Register, MD  Presented from: Home Chief Complaint: Confusion, agitation  History of Present Illness: [name] Plan Sr. is a 62 y.o. male with PMH significant for ESRD-HD-TTS, DM2, HTN, HLD, CAD, COPD, history of alcohol use, iron deficiency anemia, peripheral neuropathy Patient was brought to the ED by EMS today from home for altered mental status grossly worsening for 3 weeks.  Patient missed his last few dialysis.  He has been feeling brain fog for the last couple of weeks not acting like himself.  Was restless and agitated at home.  He also has a couple of falls due to loss of balance.  He lives with his mother, smokes marijuana. Past history of alcohol use, mom denies recent use.  Patient states last time he drank a little beer was 2 weeks ago with his friends..  Continues to smoke.  In the ED, patient was afebrile, heart rate in 90s and low 100s, blood pressure significantly elevated to 198/99 and has remained elevated for past few hours.  Initially did not require supplemental oxygen.  Later felt short of breath and was put on 4 L of the oxygen by nasal cannula. Labs with WC count 11.1, hemoglobin 11.3, troponin elevated to 149, sodium 133, BUN/creatinine 15/61, ammonia 24 blood alcohol level not elevated.  CT scan of head did not show acute intracranial abnormality, showed chronic small microvascular changes EKG with sinus tachycardia at 103 bpm, QTc 493 ms.  Patient was given IV hydralazine x 2 EDP discussed with nephrology, tentative plan of dialysis today. Hospitalist service was consulted for inpatient admission and management  At the time of my evaluation, patient was propped up in bed.  Alert, awake, seems anxious and was asking if he would be allowed to smoke.  His mother as well as other family members were at bedside. Nephrologist Dr. Arlean Hopping was also at  bedside.  Review of Systems:  All systems were reviewed and were negative unless otherwise mentioned in the HPI   Past medical history: Past Medical History:  Diagnosis Date   Acute renal failure superimposed on stage 2 chronic kidney disease (HCC) 06/12/2018   Alcohol abuse 10/18/2017   Allergy, unspecified, initial encounter 03/26/2021   Anaphylactic shock, unspecified, initial encounter 03/26/2021   Anemia in chronic kidney disease 03/26/2021   Anxiety and depression 10/06/2017   Arthritis    Atherosclerotic heart disease of native coronary artery without angina pectoris 03/26/2021   Bilateral lower extremity edema 06/11/2018   Bradycardia with 41-50 beats per minute 05/05/2021   CAD (coronary artery disease) 10/06/2017   CHF (congestive heart failure) (HCC)    CKD (chronic kidney disease) 07/20/2018   CKD (chronic kidney disease) stage 5, GFR less than 15 ml/min (HCC)    dialysis T/Th/Sa   Complication of vascular dialysis catheter 03/26/2021   COPD (chronic obstructive pulmonary disease) (HCC) 09/29/2017   Coronary artery calcification seen on CT scan 10/09/2017   Cough    Diarrhea, unspecified 03/26/2021   Disorder of phosphorus metabolism, unspecified 07/28/2021   Dyspnea    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   Edema 07/20/2018   ESRD (end stage renal disease) (HCC)    TTHSAT  [name]   Fever, unspecified 03/26/2021   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 Tremors whebn he  moves it.   Shortness of breath 03/26/2021   Tobacco abuse 09/29/2017   Type 2 diabetes mellitus (HCC) 10/17/2011   diet controlled - per pt does not check CBG at home- insurance did not approve - 08/13/21   Type 2 diabetes mellitus with diabetic peripheral angiopathy without gangrene (HCC) 03/26/2021   Type 2 diabetes mellitus with other diabetic kidney complication (HCC) 03/26/2021    Past surgical history: Past Surgical History:  Procedure Laterality Date   A/V FISTULAGRAM Right 12/06/2021   Procedure: A/V Fistulagram;  Surgeon: Maeola Harman, MD;  Location: [facility] INVASIVE CV LAB;  Service: Cardiovascular;  Laterality: Right;   ANTERIOR CERVICAL DECOMP/DISCECTOMY FUSION N/A 09/14/2017   Procedure: ANTERIOR CERVICAL DECOMPRESSION/DISCECTOMY FUSIONCERVICAL 4- CERVICAL 5, CERVICAL 5- CERVICAL 6;  Surgeon: Donalee Citrin, MD;  Location: [facility] OR;  Service: Neurosurgery;  Laterality: N/A;  ANTERIOR CERVICAL DECOMPRESSION/DISCECTOMY FUSIONCERVICAL 4- CERVICAL 5, CERVICAL 5- CERVICAL 6   AV FISTULA PLACEMENT Left 03/05/2021   Procedure: LEFT UPPER EXTREMITY RADIOBASILIC  ARTERIOVENOUS (AV) FISTULA CREATION;  Surgeon: Leonie Douglas, MD;  Location: [facility] OR;  Service: Vascular;  Laterality: Left;  PERIPHERAL NERVE BLOCK   AV FISTULA PLACEMENT Left 04/21/2021   Procedure: INSERTION OF  LEFT ARM ARTERIOVENOUS (AV) GORE-TEX GRAFT;  Surgeon: Leonie Douglas, MD;  Location: [facility] OR;  Service: Vascular;  Laterality: Left;  PERIPHERAL NERVE BLOCK   AV FISTULA PLACEMENT Right 06/30/2021   Procedure: RIGHT ARM ARTERIOVENOUS (AV) FISTULA;  Surgeon: Leonie Douglas, MD;  Location: [facility] OR;  Service: Vascular;  Laterality: Right;  PERIPHERAL NERVE BLOCK   BASCILIC VEIN TRANSPOSITION Right 08/16/2021   Procedure: RIGHT ARM SECOND STAGE BASILIC VEIN TRANSPOSITION;  Surgeon: Leonie Douglas, MD;  Location: [facility] OR;  Service: Vascular;  Laterality: Right;  PERIPHERAL NERVE BLOCK, LOCAL GIVEN   IR FLUORO GUIDE CV LINE LEFT   03/25/2021   PERIPHERAL VASCULAR BALLOON ANGIOPLASTY Right 12/06/2021   Procedure: PERIPHERAL VASCULAR BALLOON ANGIOPLASTY;  Surgeon: Maeola Harman, MD;  Location: [facility] INVASIVE CV LAB;  Service: Cardiovascular;  Laterality: Right;   UPPER EXTREMITY VENOGRAPHY Bilateral 06/04/2021   Procedure: CENTRAL  AND UPPER VENOGRAPHY;  Surgeon: Leonie Douglas, MD;  Location: [facility] INVASIVE CV LAB;  Service: Cardiovascular;  Laterality: Bilateral;    Social History:  reports that he has been smoking cigarettes. He has a 63 pack-year smoking history. He has never used smokeless tobacco. He reports that he does not currently use alcohol after a past usage of about 10.0 standard drinks of alcohol per week. He reports current drug use. Drug: Marijuana.  Allergies:  No Known Allergies Patient has no known allergies.   Family history:  Family History  Problem Relation Age of Onset   Diabetes Mother    Scoliosis Mother    Kidney cancer Father    Lung cancer Father    Hypertension Brother    Colon cancer Neg Hx    Pancreatic cancer Neg Hx    Prostate cancer Neg Hx    Rectal cancer Neg Hx    Stomach cancer Neg Hx      Physical Exam: Vitals:   05/09/23 1200 05/09/23 1230 05/09/23 1345 05/09/23  1515  BP: (!) 189/104 (!) 198/97 (!) 198/112 (!) 195/103  Pulse: 96 (!) 109 (!) 106 (!) 101  Resp: 17 (!) 33    Temp:    98 F (36.7 C)  TempSrc:      SpO2: 96% 92% 98% 98%  Height:       Wt Readings from Last 3 Encounters:  09/12/22 96.2 kg  08/15/22 97.1 kg  08/11/22 95.3 kg   Body mass index is 27.22 kg/m.  General exam: Pleasant, middle-aged, looks older than his stated age Skin: No rashes, lesions or ulcers. HEENT: Atraumatic, normocephalic, no obvious bleeding Lungs: Mild end expiratory wheezing bilaterally, no crackles CVS: Mild tachycardia, no murmur GI/Abd: soft, nontender, nondistended, bowel sound present CNS: Alert, awake, oriented to place and person, slow and  time. Psychiatry: Patient very anxious Extremities: No pedal edema, no calf tenderness   ------------------------------------------------------------------------------------------------------ Assessment/Plan: Principal Problem:   Acute metabolic encephalopathy  Acute respiratory failure with hypoxia ESRD-HD-TTS Presented with shortness of breath Started desatting in the ED and required supplemental oxygen Clinically does not look volume overloaded but missed a couple of sessions of dialysis. Nephrology as a tentative plan of dialyzing him today.  Acute metabolic encephalopathy Reports brain fogginess off and on for last 3 weeks. Could be due to uremia from missed dialysis.  Also could be hypertensive encephalopathy due to significantly elevated blood pressure. Also seems to be going through nicotine withdrawal at this time. CT head without any acute intracranial abnormality Continue to monitor mental status change  Uncontrolled hypertension Blood pressure elevated to 190s consistently in the ED today PTA meds-  carvedilol, amlodipine, lisinopril. Unclear compliance.  Resume all.  IV hydralazine as needed   Frequent falls  back pain Secondary to multiple falls in recent weeks. No area of point tenderness Scheduled Tylenol and as needed oxycodone ordered PT eval ordered. Also obtain MRI brain   Elevated troponin CAD/HLD Troponin elevated to 149.  Repeat troponin slightly more elevated.  No anginal symptoms.   Troponin elevation likely due to elevated blood pressure. EKG without ischemic changes Obtain echocardiogram to rule out wall motion abnormality PTA meds-Zetia, Crestor to continue. Recent Labs    05/09/23 1108 05/09/23 1311  TROPONINIHS 149* 164*   Type 2 diabetes mellitus A1c 6.1 on 05/09/2023 PTA meds-none Continue SSI/Accu-Cheks Recent Labs  Lab 05/09/23 1056  GLUCAP 133*   H/o seizure disorder Reports compliance to Keppra.  Continue the  same.  COPD Current everyday smoker Continue bronchodilators as needed.  Counseled to quit smoking  H/o alcohol use Stopped several years ago.  Patient says he took a small amount of beer 2 weeks ago with his friends. Alcohol level not elevated, ammonia level not elevated CIWA protocol  peripheral neuropathy PTA meds-continue pregabalin  Anxiety/depression Atarax, Celexa Resume    Goals of care   Code Status: Full Code    DVT prophylaxis:  heparin injection 5,000 Units Start: 05/09/23 1445   Antimicrobials: None Fluid: None Consultants: Nephrology Family Communication: Multiple family members at bedside  Dispo: The patient is from: Home              Anticipated d/c is to: Home, pending clinical course  Diet: Diet Order             Diet renal/carb modified with fluid restriction Diet-HS Snack? Nothing; Fluid restriction: 1200 mL Fluid; Room service appropriate? Yes; Fluid consistency: Thin  Diet effective now                    -------------------------------------------------------------------------------------  Severity of Illness: The appropriate patient status for this patient is OBSERVATION. Observation status is judged to be reasonable and necessary in order to provide the required intensity of service to ensure the patient's safety. The patient's presenting symptoms, physical exam findings, and initial radiographic and laboratory data in the context of their medical condition is felt to place them at decreased risk for further clinical deterioration. Furthermore, it is anticipated that the patient will be medically stable for discharge from the hospital within 2 midnights of admission.  -------------------------------------------------------------------------------------  Home Meds: Prior to Admission medications   Medication Sig Start Date End Date Taking? Authorizing Provider  Accu-Chek Softclix Lancets lancets Use as instructed tid before meals. Dx  E11.22 08/15/22   Hoy Register, MD  albuterol (VENTOLIN HFA) 108 (90 Base) MCG/ACT inhaler INHALE 2 PUFFS INTO THE LUNGS EVERY 6 (SIX) HOURS AS NEEDED FOR WHEEZING OR SHORTNESS OF BREATH. 05/31/22   Hoy Register, MD  amLODipine (NORVASC) 10 MG tablet Take 1 tablet (10 mg total) by mouth Nightly. 04/27/23     amLODipine (NORVASC) 5 MG tablet Take 1 tablet (5 mg total) by mouth Nightly. 06/14/22     amLODipine (NORVASC) 5 MG tablet Take 1 tablet (5 mg total) by mouth Nightly. 11/29/22     AURYXIA 1 GM 210 MG(Fe) tablet Take 420 mg by mouth 2 (two) times daily before a meal. 04/15/21   [provider]  Blood Glucose Monitoring Suppl (ACCU-CHEK GUIDE) w/Device KIT Use as directed 3 times daily. 08/15/22   Hoy Register, MD  carvedilol (COREG) 6.25 MG tablet Take 1 tablet by mouth twice a day 04/27/23     citalopram (CELEXA) 40 MG tablet Take 1 tablet (40 mg total) by mouth daily.Must have office visit for refills 03/29/23   Hoy Register, MD  ezetimibe (ZETIA) 10 MG tablet Take 1 tablet (10 mg total) by mouth daily. 12/02/22   Revankar, Aundra Dubin, MD  fluticasone-salmeterol (ADVAIR) 250-50 MCG/ACT AEPB Inhale 1 puff into the lungs 2 (two) times daily. 02/03/22 02/03/23  Hoy Register, MD  glucose blood (ACCU-CHEK GUIDE) test strip Use as instructed 3 times daily. Dx E11.22 08/15/22   Hoy Register, MD  hydrOXYzine (ATARAX) 25 MG tablet Take 1 tablet (25 mg total) by mouth 3 (three) times daily as needed for anxiety.Must have office visit for refills. 03/29/23   Hoy Register, MD  levETIRAcetam (KEPPRA) 500 MG tablet Take 1 tablet (500 mg total) by mouth 2 (two) times daily.Must have office visit for refills. 03/29/23   Hoy Register, MD  lisinopril (ZESTRIL) 10 MG tablet Take 1 tablet (10 mg total) by mouth daily. 12/20/22     lisinopril (ZESTRIL) 10 MG tablet Take 1 tablet (10 mg total) by mouth daily. 01/19/23     lisinopril (ZESTRIL) 20 MG tablet Take 1 tablet by mouth once a day 04/20/23      lisinopril (ZESTRIL) 5 MG tablet Take 1 tablet (5 mg total) by mouth daily. 12/19/22     nicotine (NICODERM CQ) 14 mg/24hr patch Place 1 patch (14 mg total) onto the skin daily. 08/15/22   Hoy Register, MD  pregabalin (LYRICA) 25 MG capsule Take 1 capsule (25 mg total) by mouth at bedtime. 12/05/22   Hoy Register, MD  rosuvastatin (CRESTOR) 10 MG tablet Take 1 tablet (10 mg total) by mouth daily. 09/02/22   Hoy Register, MD  sodium zirconium cyclosilicate (LOKELMA) 10 g PACK packet Take 10 gram by mouth twice a week as directed on non-dialysis days (Wed  and Sunday) 08/23/22     sodium zirconium cyclosilicate (LOKELMA) 10 g PACK packet Take 10 gram by mouth twice a week as directed on non-dialysis days (Wed and Sunday) 10/27/22       Labs on Admission:   CBC: Recent Labs  Lab 05/09/23 1108  WBC 11.1*  NEUTROABS 8.6*  HGB 11.3*  HCT 33.3*  MCV 91.0  PLT 248    Basic Metabolic Panel: Recent Labs  Lab 05/09/23 1108  NA 133*  K 5.1  CL 90*  CO2 24  GLUCOSE 127*  BUN 51*  CREATININE 16.11*  CALCIUM 9.5    Liver Function Tests: Recent Labs  Lab 05/09/23 1108  AST 23  ALT 12  ALKPHOS 58  BILITOT 0.8  PROT 6.2*  ALBUMIN 3.4*   Recent Labs  Lab 05/09/23 1108  LIPASE 73*   Recent Labs  Lab 05/09/23 1126  AMMONIA 24    Cardiac Enzymes: No results for input(s): "CKTOTAL", "CKMB", "CKMBINDEX", "TROPONINI" in the last 168 hours.  BNP (last 3 results) No results for input(s): "BNP" in the last 8760 hours.  ProBNP (last 3 results) No results for input(s): "PROBNP" in the last 8760 hours.  CBG: Recent Labs  Lab 05/09/23 1056  GLUCAP 133*    Lipase     Component Value Date/Time   LIPASE 73 (H) 05/09/2023 1108     Urinalysis    Component Value Date/Time   COLORURINE YELLOW 06/12/2018 1558   APPEARANCEUR CLEAR 06/12/2018 1558   LABSPEC 1.009 06/12/2018 1558   PHURINE 5.0 06/12/2018 1558   GLUCOSEU 50 (A) 06/12/2018 1558   HGBUR MODERATE (A)  06/12/2018 1558   BILIRUBINUR NEGATIVE 06/12/2018 1558   BILIRUBINUR neg 01/11/2018 1459   KETONESUR NEGATIVE 06/12/2018 1558   PROTEINUR 100 (A) 06/12/2018 1558   UROBILINOGEN 0.2 01/11/2018 1459   NITRITE NEGATIVE 06/12/2018 1558   LEUKOCYTESUR NEGATIVE 06/12/2018 1558     Drugs of Abuse     Component Value Date/Time   LABOPIA POSITIVE (A) 10/17/2017 2137   COCAINSCRNUR NONE DETECTED 10/17/2017 2137   LABBENZ NONE DETECTED 10/17/2017 2137   AMPHETMU NONE DETECTED 10/17/2017 2137   THCU POSITIVE (A) 10/17/2017 2137   LABBARB NONE DETECTED 10/17/2017 2137      Radiological Exams on Admission: DG Chest Portable 1 View  Result Date: 05/09/2023 CLINICAL DATA:  Weakness altered mental status EXAM: PORTABLE CHEST 1 VIEW COMPARISON:  06/12/2018, chest CT 02/22/2021 FINDINGS: Borderline cardiac enlargement. Aortic atherosclerosis. Mild asymmetric ground-glass opacity in the right thorax. Mild airspace disease at the left lung base. Possible tiny effusions. IMPRESSION: Mild asymmetric ground-glass opacity in the right thorax with more focal airspace disease at left lung base, favor infection. Possible tiny effusions. Electronically Signed   By: Jasmine Pang M.D.   On: 05/09/2023 15:25   CT Head Wo Contrast  Result Date: 05/09/2023 CLINICAL DATA:  Mental status change of unknown cause. Agitation. Dialysis patient. EXAM: CT HEAD WITHOUT CONTRAST TECHNIQUE: Contiguous axial images were obtained from the base of the skull through the vertex without intravenous contrast. RADIATION DOSE REDUCTION: This exam was performed according to the departmental dose-optimization program which includes automated exposure control, adjustment of the mA and/or kV according to patient size and/or use of iterative reconstruction technique. COMPARISON:  04/09/2023 FINDINGS: Brain: No acute finding. Chronic small-vessel ischemic changes of the white matter as seen previously. No sign of acute infarction, mass lesion,  hemorrhage, hydrocephalus or extra-axial collection. Vascular: There is atherosclerotic calcification of the major vessels  at the base of the brain. Skull: Negative Sinuses/Orbits: Clear/normal Other: None IMPRESSION: No acute CT finding. Chronic small-vessel ischemic changes of the white matter. Electronically Signed   By: Paulina Fusi M.D.   On: 05/09/2023 13:33     Signed, Lorin Glass, MD [facility] 05/09/2023

## 2023-05-09 NOTE — ED Notes (Signed)
Pt complains of increased shob, feeling as if he can not get enough air. Pt with labored breathing, diaphoretic. MD made aware. Pt placed on 4L [place].

## 2023-05-09 NOTE — ED Triage Notes (Signed)
Pt BIB EMS. Pt has not been to dialysis since Thursday by choice. AMS for the past 3 weeks. Pt feels brain fog. Pt has not been able to urinate. Pt hypertensive. Pt is axox2 on arrival.

## 2023-05-10 ENCOUNTER — Observation Stay (HOSPITAL_COMMUNITY): Payer: Medicare Other

## 2023-05-10 DIAGNOSIS — M1990 Unspecified osteoarthritis, unspecified site: Secondary | ICD-10-CM | POA: Diagnosis present

## 2023-05-10 DIAGNOSIS — R0602 Shortness of breath: Secondary | ICD-10-CM | POA: Diagnosis present

## 2023-05-10 DIAGNOSIS — Z992 Dependence on renal dialysis: Secondary | ICD-10-CM | POA: Diagnosis not present

## 2023-05-10 DIAGNOSIS — E871 Hypo-osmolality and hyponatremia: Secondary | ICD-10-CM | POA: Diagnosis present

## 2023-05-10 DIAGNOSIS — I5021 Acute systolic (congestive) heart failure: Secondary | ICD-10-CM | POA: Diagnosis present

## 2023-05-10 DIAGNOSIS — E1151 Type 2 diabetes mellitus with diabetic peripheral angiopathy without gangrene: Secondary | ICD-10-CM | POA: Diagnosis present

## 2023-05-10 DIAGNOSIS — R9431 Abnormal electrocardiogram [ECG] [EKG]: Secondary | ICD-10-CM | POA: Diagnosis not present

## 2023-05-10 DIAGNOSIS — E861 Hypovolemia: Secondary | ICD-10-CM | POA: Diagnosis not present

## 2023-05-10 DIAGNOSIS — G9340 Encephalopathy, unspecified: Secondary | ICD-10-CM | POA: Diagnosis not present

## 2023-05-10 DIAGNOSIS — E1142 Type 2 diabetes mellitus with diabetic polyneuropathy: Secondary | ICD-10-CM | POA: Diagnosis present

## 2023-05-10 DIAGNOSIS — G9341 Metabolic encephalopathy: Secondary | ICD-10-CM | POA: Diagnosis present

## 2023-05-10 DIAGNOSIS — I132 Hypertensive heart and chronic kidney disease with heart failure and with stage 5 chronic kidney disease, or end stage renal disease: Secondary | ICD-10-CM | POA: Diagnosis present

## 2023-05-10 DIAGNOSIS — J9601 Acute respiratory failure with hypoxia: Secondary | ICD-10-CM | POA: Diagnosis present

## 2023-05-10 DIAGNOSIS — D631 Anemia in chronic kidney disease: Secondary | ICD-10-CM | POA: Diagnosis present

## 2023-05-10 DIAGNOSIS — G319 Degenerative disease of nervous system, unspecified: Secondary | ICD-10-CM | POA: Diagnosis present

## 2023-05-10 DIAGNOSIS — F17213 Nicotine dependence, cigarettes, with withdrawal: Secondary | ICD-10-CM | POA: Diagnosis present

## 2023-05-10 DIAGNOSIS — E785 Hyperlipidemia, unspecified: Secondary | ICD-10-CM | POA: Diagnosis present

## 2023-05-10 DIAGNOSIS — F341 Dysthymic disorder: Secondary | ICD-10-CM | POA: Diagnosis present

## 2023-05-10 DIAGNOSIS — E1122 Type 2 diabetes mellitus with diabetic chronic kidney disease: Secondary | ICD-10-CM | POA: Diagnosis present

## 2023-05-10 DIAGNOSIS — N2581 Secondary hyperparathyroidism of renal origin: Secondary | ICD-10-CM | POA: Diagnosis present

## 2023-05-10 DIAGNOSIS — R296 Repeated falls: Secondary | ICD-10-CM | POA: Diagnosis present

## 2023-05-10 DIAGNOSIS — I2510 Atherosclerotic heart disease of native coronary artery without angina pectoris: Secondary | ICD-10-CM | POA: Diagnosis present

## 2023-05-10 DIAGNOSIS — G40909 Epilepsy, unspecified, not intractable, without status epilepticus: Secondary | ICD-10-CM | POA: Diagnosis present

## 2023-05-10 DIAGNOSIS — Z981 Arthrodesis status: Secondary | ICD-10-CM | POA: Diagnosis not present

## 2023-05-10 DIAGNOSIS — N186 End stage renal disease: Secondary | ICD-10-CM | POA: Diagnosis present

## 2023-05-10 DIAGNOSIS — J449 Chronic obstructive pulmonary disease, unspecified: Secondary | ICD-10-CM | POA: Diagnosis present

## 2023-05-10 DIAGNOSIS — Z91158 Patient's noncompliance with renal dialysis for other reason: Secondary | ICD-10-CM | POA: Diagnosis not present

## 2023-05-10 LAB — BASIC METABOLIC PANEL
Anion gap: 16 — ABNORMAL HIGH (ref 5–15)
BUN: 28 mg/dL — ABNORMAL HIGH (ref 8–23)
CO2: 27 mmol/L (ref 22–32)
Calcium: 9.3 mg/dL (ref 8.9–10.3)
Chloride: 93 mmol/L — ABNORMAL LOW (ref 98–111)
Creatinine, Ser: 10.97 mg/dL — ABNORMAL HIGH (ref 0.61–1.24)
GFR, Estimated: 5 mL/min — ABNORMAL LOW (ref >60)
Glucose, Bld: 111 mg/dL — ABNORMAL HIGH (ref 70–99)
Potassium: 4.6 mmol/L (ref 3.5–5.1)
Sodium: 136 mmol/L (ref 135–145)

## 2023-05-10 LAB — ECHOCARDIOGRAM COMPLETE
AR max vel: 2.49 cm2
AV Area VTI: 2.56 cm2
AV Area mean vel: 2.35 cm2
AV Mean grad: 6 mm[Hg]
AV Peak grad: 10.8 mm[Hg]
Ao pk vel: 1.64 m/s
Area-P 1/2: 3.83 cm2
Calc EF: 40.8 %
Height: 74 in
S' Lateral: 4.6 cm
Single Plane A2C EF: 40.2 %
Single Plane A4C EF: 42.5 %
Weight: 2920.65 [oz_av]

## 2023-05-10 LAB — CBC
HCT: 33.1 % — ABNORMAL LOW (ref 39.0–52.0)
Hemoglobin: 11.1 g/dL — ABNORMAL LOW (ref 13.0–17.0)
MCH: 29.6 pg (ref 26.0–34.0)
MCHC: 33.5 g/dL (ref 30.0–36.0)
MCV: 88.3 fL (ref 80.0–100.0)
Platelets: 256 10*3/uL (ref 150–400)
RBC: 3.75 MIL/uL — ABNORMAL LOW (ref 4.22–5.81)
RDW: 14.6 % (ref 11.5–15.5)
WBC: 11.4 10*3/uL — ABNORMAL HIGH (ref 4.0–10.5)
nRBC: 0 % (ref 0.0–0.2)

## 2023-05-10 LAB — GLUCOSE, CAPILLARY
Glucose-Capillary: 118 mg/dL — ABNORMAL HIGH (ref 70–99)
Glucose-Capillary: 110 mg/dL — ABNORMAL HIGH (ref 70–99)
Glucose-Capillary: 113 mg/dL — ABNORMAL HIGH (ref 70–99)
Glucose-Capillary: 97 mg/dL (ref 70–99)
Glucose-Capillary: 98 mg/dL (ref 70–99)

## 2023-05-10 LAB — HIV ANTIBODY (ROUTINE TESTING W REFLEX): HIV Screen 4th Generation wRfx: NONREACTIVE

## 2023-05-10 MED ORDER — CHLORHEXIDINE GLUCONATE CLOTH 2 % EX PADS
6.0000 | MEDICATED_PAD | Freq: Every day | CUTANEOUS | Status: DC
  Administered 2023-05-11 – 2023-05-15 (×4): 6 via TOPICAL

## 2023-05-10 MED ORDER — CITALOPRAM HYDROBROMIDE 20 MG PO TABS
20.0000 mg | ORAL_TABLET | Freq: Every day | ORAL | Status: DC
  Administered 2023-05-11: 20 mg via ORAL
  Filled 2023-05-10: qty 1

## 2023-05-10 MED ORDER — QUETIAPINE FUMARATE 25 MG PO TABS
25.0000 mg | ORAL_TABLET | Freq: Two times a day (BID) | ORAL | Status: DC
  Administered 2023-05-10 – 2023-05-11 (×3): 25 mg via ORAL
  Filled 2023-05-10 (×3): qty 1

## 2023-05-10 MED ORDER — MELATONIN 3 MG PO TABS
3.0000 mg | ORAL_TABLET | Freq: Every day | ORAL | Status: DC
  Administered 2023-05-11 – 2023-05-15 (×5): 3 mg via ORAL
  Filled 2023-05-10 (×6): qty 1

## 2023-05-10 NOTE — Care Management Obs Status (Signed)
MEDICARE OBSERVATION STATUS NOTIFICATION   Patient Details  Name: [name] Mccrea Sr. MRN: [id number] Date of Birth: 1960-12-07   Medicare Observation Status Notification Given:  Yes    Kingsley Plan, RN 05/10/2023, 10:08 AM

## 2023-05-10 NOTE — TOC Progression Note (Addendum)
Transition of Care (TOC) - Progression Note    Patient Details  Name: [name] Clowes Sr. MRN: [id number] Date of Birth: 03-10-1961  Transition of [facility]) CM/SW Contact  Kala Ambriz A [place], [place] Phone Number: 05/10/2023, 11:00 AM  Clinical Narrative:     Update 11/14 1235 CSW was contacted by [facility] eligibility staff, they stated pt was eligible for SNF waiver. SNF workup to be completed once PT eval is complete.   Update 05/10/23 1548 CSW followed up with [facility] waiver to determine eligibility. CSW to send out referrals once PT evaluation is complete.   CSW met with pt, pt's daughter Amy and pt's mother at bedside. They were agreeable to SNF. CSW to reach out to [facility] Waiver to see if pt is eligible and complete SNF workup.   TOC will continue to follow.     Barriers to Discharge: Continued Medical Work up  Expected Discharge Plan and Services       Living arrangements for the past 2 months: Single Family Home                   DME Agency: NA       HH Arranged: NA           Social Determinants of Health (SDOH) Interventions SDOH Screenings   Food Insecurity: No Food Insecurity (05/09/2023)  Housing: Low Risk  (05/09/2023)  Transportation Needs: No Transportation Needs (05/09/2023)  Utilities: Not At Risk (05/09/2023)  Alcohol Screen: Low Risk  (09/03/2021)  Depression (PHQ2-9): Low Risk  (09/12/2022)  Financial Resource Strain: Low Risk  (09/12/2022)  Physical Activity: Inactive (09/12/2022)  Social Connections: Moderately Isolated (09/03/2021)  Stress: No Stress Concern Present (09/12/2022)  Tobacco Use: High Risk (05/09/2023)    Readmission Risk Interventions     No data to display

## 2023-05-10 NOTE — Progress Notes (Signed)
PT Cancellation Note  Patient Details Name: [name] Perelman Sr. MRN: [id number] DOB: 07/22/1960   Cancelled Treatment:    Reason Eval/Treat Not Completed: Patient's level of consciousness  Not responding to commands. Opens eyes briefly with vigorous tactile cues.  Will attempt comprehensive evaluation tomorrow. Hopefully pt more alert.  Family in room, very supportive.  Kathlyn Sacramento, PT, DPT [facility]  Rehabilitation Services Physical Therapist Office: [phone] Website: [email or web address]  Berton Mount 05/10/2023, 12:42 PM

## 2023-05-10 NOTE — Progress Notes (Signed)
Pt receives out-pt HD at [facility] on TTS 10:45 am chair time. Will assist as needed.   Olivia Canter Renal Navigator [phone]

## 2023-05-10 NOTE — Plan of Care (Signed)
  Problem: Coping: Goal: Ability to adjust to condition or change in health will improve Outcome: Progressing   Problem: Fluid Volume: Goal: Ability to maintain a balanced intake and output will improve Outcome: Progressing   Problem: Metabolic: Goal: Ability to maintain appropriate glucose levels will improve Outcome: Progressing   Problem: Nutritional: Goal: Maintenance of adequate nutrition will improve Outcome: Progressing   Problem: Skin Integrity: Goal: Risk for impaired skin integrity will decrease Outcome: Progressing   Problem: Tissue Perfusion: Goal: Adequacy of tissue perfusion will improve Outcome: Progressing   Problem: Clinical Measurements: Goal: Ability to maintain clinical measurements within normal limits will improve Outcome: Progressing   Problem: Activity: Goal: Risk for activity intolerance will decrease Outcome: Progressing   Problem: Nutrition: Goal: Adequate nutrition will be maintained Outcome: Progressing   Problem: Coping: Goal: Level of anxiety will decrease Outcome: Progressing   Problem: Elimination: Goal: Will not experience complications related to bowel motility Outcome: Progressing   Problem: Pain Management: Goal: General experience of comfort will improve Outcome: Progressing   Problem: Safety: Goal: Ability to remain free from injury will improve Outcome: Progressing   Problem: Skin Integrity: Goal: Risk for impaired skin integrity will decrease Outcome: Progressing

## 2023-05-10 NOTE — [place] FL2 (Signed)
[place] MEDICAID FL2 LEVEL OF CARE FORM     IDENTIFICATION  Patient Name: [name] Brakeman Sr. Birthdate: [birth year] Sex: male Admission Date (Current Location): 05/09/2023  [place] and [place] Number:  Haynes Bast [id number] T Facility and Address:  [facility] [place], [address] [place], [place] [address]      Provider Number: [id number]  Attending Physician Name and Address:  Lorin Glass, MD  Relative Name and Phone Number:  Rolan Lipa (Mother)  [phone]    Current Level of Care: Hospital Recommended Level of Care: Skilled Nursing Facility Prior Approval Number:    Date Approved/Denied:   PASRR Number: [id number]  Discharge Plan: SNF    Current Diagnoses: Patient Active Problem List   Diagnosis Date Noted   Acute metabolic encephalopathy 05/09/2023   CKD (chronic kidney disease) stage 5, GFR less than 15 ml/min (HCC) 08/04/2022   ESRD (end stage renal disease) (HCC) 08/04/2022   Shakes 08/04/2022   Disorder of phosphorus metabolism, unspecified 07/28/2021   Bradycardia with 41-50 beats per minute 05/05/2021   Allergy, unspecified, initial encounter 03/26/2021   Anaphylactic shock, unspecified, initial encounter 03/26/2021   Anemia in chronic kidney disease 03/26/2021   Complication of vascular dialysis catheter 03/26/2021   Diarrhea, unspecified 03/26/2021   Fever, unspecified 03/26/2021   Iron deficiency anemia, unspecified 03/26/2021   Other specified coagulation defects (HCC) 03/26/2021   Pruritus, unspecified 03/26/2021   Secondary hyperparathyroidism of renal origin (HCC) 03/26/2021   Type 2 diabetes mellitus with diabetic peripheral angiopathy without gangrene (HCC) 03/26/2021   Atherosclerotic heart disease of native coronary artery without angina pectoris 03/26/2021   Shortness of breath 03/26/2021   Type 2 diabetes mellitus with other diabetic kidney complication (HCC) 03/26/2021   Arthritis 03/10/2021   CHF (congestive  heart failure) (HCC) 03/10/2021   Cough 03/10/2021   Heart murmur 03/10/2021   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   CKD (chronic kidney disease) 07/20/2018   Edema 07/20/2018   Acute renal failure superimposed on stage 2 chronic kidney disease (HCC) 06/12/2018   Hyperkalemia 06/12/2018   Bilateral lower extremity edema 06/11/2018   Seizure (HCC) 10/18/2017   Alcohol abuse 10/18/2017   Neuropathy 10/18/2017   Coronary artery calcification seen on CT scan 10/09/2017   Anxiety and depression 10/06/2017   Dyspnea 10/06/2017   CAD (coronary artery disease) 10/06/2017   COPD (chronic obstructive pulmonary disease) (HCC) 09/29/2017   Tobacco abuse 09/29/2017   Hypertension 09/29/2017   Myelopathy (HCC) 09/14/2017   Type 2 diabetes mellitus (HCC) 10/17/2011   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    Orientation RESPIRATION BLADDER Height & Weight     Self  O2 (2L) Continent Weight: 182 lb 8.7 oz (82.8 kg) Height:  6\' 2"  (188 cm)  BEHAVIORAL SYMPTOMS/MOOD NEUROLOGICAL BOWEL NUTRITION STATUS    Convulsions/Seizures Continent Diet (see DC summary)  AMBULATORY STATUS COMMUNICATION OF NEEDS Skin   Extensive Assist Verbally                         Personal Care Assistance Level of Assistance  Bathing, Feeding, Dressing Bathing Assistance: Maximum assistance Feeding assistance: Limited assistance Dressing Assistance: Maximum assistance     Functional Limitations Info  Sight, Hearing, Speech Sight Info: Adequate Hearing Info: Impaired Speech Info: Adequate    SPECIAL CARE FACTORS FREQUENCY  OT (By licensed OT), PT (By licensed PT)     PT Frequency: 5x/week OT Frequency: 5x/week  Contractures Contractures Info: Not present    Additional Factors Info  Code Status, Allergies Code Status Info: FULL Allergies Info: No Known Allergies           Current Medications (05/10/2023):  This is the current  hospital active medication list Current Facility-Administered Medications  Medication Dose Route Frequency Provider Last Rate Last Admin   acetaminophen (TYLENOL) tablet 1,000 mg  1,000 mg Oral TID Lorin Glass, MD   1,000 mg at 05/10/23 0853   albuterol (PROVENTIL) (2.5 MG/3ML) 0.083% nebulizer solution 2.5 mg  2.5 mg Nebulization Q6H PRN Knute Neu, RPH       amLODipine (NORVASC) tablet 10 mg  10 mg Oral QHS Dahal, Melina Schools, MD   10 mg at 05/10/23 0146   calcitRIOL (ROCALTROL) capsule 0.75 mcg  0.75 mcg Oral Q T,Th,Sat-1800 Delano Metz, MD   0.75 mcg at 05/10/23 0147   carvedilol (COREG) tablet 6.25 mg  6.25 mg Oral BID Lorin Glass, MD   6.25 mg at 05/10/23 0853   Chlorhexidine Gluconate Cloth 2 % PADS 6 each  6 each Topical Q0600 Delano Metz, MD   6 each at 05/10/23 0638   [START ON 05/11/2023] Chlorhexidine Gluconate Cloth 2 % PADS 6 each  6 each Topical Q0600 Delano Metz, MD       Melene Muller ON 05/11/2023] citalopram (CELEXA) tablet 20 mg  20 mg Oral Daily Dahal, Binaya, MD       ezetimibe (ZETIA) tablet 10 mg  10 mg Oral Daily Dahal, Binaya, MD   10 mg at 05/10/23 0853   feeding supplement (NEPRO CARB STEADY) liquid 237 mL  237 mL Oral BID BM Dahal, Binaya, MD   237 mL at 05/10/23 0902   ferric citrate (AURYXIA) tablet 420 mg  420 mg Oral TID WC Delano Metz, MD   420 mg at 05/10/23 1322   folic acid (FOLVITE) tablet 1 mg  1 mg Oral Daily Dahal, Binaya, MD   1 mg at 05/10/23 0853   heparin injection 5,000 Units  5,000 Units Subcutaneous Q8H Dahal, Melina Schools, MD   5,000 Units at 05/10/23 1322   hydrALAZINE (APRESOLINE) injection 10 mg  10 mg Intravenous Q6H PRN Dahal, Binaya, MD       insulin aspart (novoLOG) injection 0-5 Units  0-5 Units Subcutaneous QHS Dahal, Binaya, MD       insulin aspart (novoLOG) injection 0-9 Units  0-9 Units Subcutaneous TID WC Dahal, Binaya, MD       levETIRAcetam (KEPPRA) tablet 500 mg  500 mg Oral BID Dahal, Melina Schools, MD   500 mg at 05/10/23 0853    lisinopril (ZESTRIL) tablet 10 mg  10 mg Oral Daily Dahal, Binaya, MD   10 mg at 05/10/23 0852   LORazepam (ATIVAN) injection 1 mg  1 mg Intravenous Q4H PRN Dahal, Melina Schools, MD       LORazepam (ATIVAN) tablet 1-4 mg  1-4 mg Oral Q1H PRN Dahal, Melina Schools, MD   2 mg at 05/10/23 1324   Or   LORazepam (ATIVAN) tablet 1 mg  1 mg Oral Q4H PRN Dahal, Binaya, MD       melatonin tablet 3 mg  3 mg Oral QHS Dahal, Binaya, MD       multivitamin with minerals tablet 1 tablet  1 tablet Oral Daily Dahal, Binaya, MD   1 tablet at 05/10/23 0902   nicotine (NICODERM CQ - dosed in mg/24 hours) patch 21 mg  21 mg Transdermal Once Curatolo, Adam, DO   21 mg  at 05/09/23 1609   oxyCODONE (Oxy IR/ROXICODONE) immediate release tablet 5 mg  5 mg Oral Q6H PRN Dahal, Melina Schools, MD       QUEtiapine (SEROQUEL) tablet 25 mg  25 mg Oral BID Dahal, Melina Schools, MD   25 mg at 05/10/23 1322   rosuvastatin (CRESTOR) tablet 10 mg  10 mg Oral Daily Dahal, Melina Schools, MD   10 mg at 05/10/23 0853   thiamine (VITAMIN B1) tablet 100 mg  100 mg Oral Daily Dahal, Melina Schools, MD   100 mg at 05/10/23 2956   Or   thiamine (VITAMIN B1) injection 100 mg  100 mg Intravenous Daily Dahal, Melina Schools, MD         Discharge Medications: Please see discharge summary for a list of discharge medications.  Relevant Imaging Results:  Relevant Lab Results:   Additional Information SSN: [id number]  Miaisabella Bacorn A [place], [place]

## 2023-05-10 NOTE — Progress Notes (Signed)
PROGRESS NOTE  [name] Barsanti Sr.  DOB: 1960-07-17  PCP: Hoy Register, MD FAO:[id number]  DOA: 05/09/2023  LOS: 0 days  Hospital Day: 2  Brief narrative: [name] Plan Sr. is a 62 y.o. male with PMH significant for ESRD-HD-TTS, DM2, HTN, HLD, CAD, COPD, history of alcohol use, iron deficiency anemia, peripheral neuropathy Patient was brought to the ED by EMS today from home for altered mental status grossly worsening for 3 weeks.  Patient missed his last few dialysis.  He has been feeling brain fog for the last couple of weeks not acting like himself.  Was restless and agitated at home.  He also has a couple of falls due to loss of balance.  He lives with his mother, smokes marijuana. Past history of alcohol use, mom denies recent use.  Patient states last time he drank a little beer was 2 weeks ago with his friends..  Continues to smoke.  In the ED, patient was afebrile, heart rate in 90s and low 100s, blood pressure significantly elevated to 198/99 and has remained elevated for past few hours.  Initially did not require supplemental oxygen.  Later felt short of breath and was put on 4 L of the oxygen by nasal cannula. Labs with WC count 11.1, hemoglobin 11.3, troponin elevated to 149, sodium 133, BUN/creatinine 15/61, ammonia 24 blood alcohol level not elevated.  CT scan of head did not show acute intracranial abnormality, showed chronic small microvascular changes EKG with sinus tachycardia at 103 bpm, QTc 493 ms.  Patient was given IV hydralazine x 2 Admitted to [facility] Nephrology was consulted.  Subjective: Patient was seen and examined this morning. Elderly Caucasian male.  Propped up in bed.  Very somnolent.  Family at bedside. Family states that he was agitated last night and did not get any sleep. His appetite is down.  Has not had a bowel movement in 3 days. For dialysis today.  Assessment/Plan: Acute respiratory failure with hypoxia ESRD-HD-TTS Presented with shortness of  breath Started desatting in the ED and required supplemental oxygen Clinically does not look volume overloaded but missed a couple of sessions of dialysis. Nephrology following with the plan of dialysis probably today.  Acute metabolic encephalopathy History of anxiety, dizziness Reports brain fogginess off and on for last 3 weeks. Could be due to uremia from missed dialysis.  Also could be hypertensive encephalopathy due to significantly elevated blood pressure. MRI brain did not show any acute intracranial abnormality but showed generalized brain atrophy and chronic small vessel ischemic disease. Per family, was agitated restless and did not sleep last night.  Somnolent in the morning today. I started him on Seroquel 25 mg twice daily as well as melatonin at bedtime. PTA meds- Celexa 40 mg daily.  Reduce to 20 mg daily. Continue to monitor mental status change  Uncontrolled hypertension Blood pressure was elevated to 190s on admission.  Gradually improving, 153. PTA meds-  carvedilol, amlodipine, lisinopril. Unclear compliance.  Currently continued on all.  IV hydralazine as needed  New mild systolic CHF Echocardiogram today showed EF low at 40 to 45%, global LV hypokinesis, grade 2 diastolic dysfunction, RV size, systolic function normal May benefit from cardiology evaluation after mental status improves.    Elevated troponin CAD/HLD Troponin was elevated to 149 >>164 Troponin elevation likely due to elevated blood pressure. EKG without ischemic changes Echo showed global hypokinesis PTA meds-Zetia, Crestor to continue. Recent Labs    05/09/23 1108 05/09/23 1311  TROPONINIHS 149* 164*   Type 2  diabetes mellitus A1c 6.1 on 05/09/2023 PTA meds-none Continue SSI/Accu-Cheks Recent Labs  Lab 05/09/23 1056 05/09/23 1950 05/10/23 0144 05/10/23 0729 05/10/23 1153  GLUCAP 133* 134* 118* 110* 113*   H/o seizure disorder Reports compliance to Keppra.  Continue the  same.  COPD Current everyday smoker Continue bronchodilators as needed.  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  On admission, patient said he small amount of beer 2 weeks ago with his friends.  Alcohol level not elevated, ammonia level not elevated CIWA protocol to continue.  peripheral neuropathy PTA meds-continue pregabalin   Frequent falls  back pain Secondary to multiple falls in recent weeks. No area of point tenderness Scheduled Tylenol and as needed oxycodone ordered PT eval ordered.  Goals of care   Code Status: Full Code    DVT prophylaxis:  heparin injection 5,000 Units Start: 05/09/23 1445   Antimicrobials: None Fluid: None Consultants: Nephrology Family Communication: Multiple family members at bedside  Status: Inpatient Level of care:  Telemetry Medical   Patient is from: Home Needs to continue in-hospital care: Mental status still poor.  IV Ativan as needed Anticipated d/c to: Pending clinical course, pending PT eval   Diet:  Diet Order             Diet renal/carb modified with fluid restriction Diet-HS Snack? Nothing; Fluid restriction: 1200 mL Fluid; Room service appropriate? Yes; Fluid consistency: Thin  Diet effective now                   Scheduled Meds:  acetaminophen  1,000 mg Oral TID   amLODipine  10 mg Oral QHS   calcitRIOL  0.75 mcg Oral Q T,Th,Sat-1800   carvedilol  6.25 mg Oral BID   Chlorhexidine Gluconate Cloth  6 each Topical Q0600   [START ON 05/11/2023] Chlorhexidine Gluconate Cloth  6 each Topical Q0600   [START ON 05/11/2023] citalopram  20 mg Oral Daily   ezetimibe  10 mg Oral Daily   feeding supplement (NEPRO CARB STEADY)  237 mL Oral BID BM   ferric citrate  420 mg Oral TID WC   folic acid  1 mg Oral Daily   heparin  5,000 Units Subcutaneous Q8H   insulin aspart  0-5 Units Subcutaneous QHS   insulin aspart  0-9 Units Subcutaneous TID WC    levETIRAcetam  500 mg Oral BID   lisinopril  10 mg Oral Daily   melatonin  3 mg Oral QHS   multivitamin with minerals  1 tablet Oral Daily   nicotine  21 mg Transdermal Once   QUEtiapine  25 mg Oral BID   rosuvastatin  10 mg Oral Daily   thiamine  100 mg Oral Daily   Or   thiamine  100 mg Intravenous Daily    PRN meds: albuterol, hydrALAZINE, LORazepam, LORazepam **OR** LORazepam, oxyCODONE   Infusions:    Antimicrobials: Anti-infectives (From admission, onward)    None       Objective: Vitals:   05/10/23 0608 05/10/23 0731  BP: (!) 142/82 (!) 157/89  Pulse: 92 98  Resp: 18 18  Temp:    SpO2: 92% 94%    Intake/Output Summary (Last 24 hours) at 05/10/2023 1525 Last data filed at 05/10/2023 0005 Gross per 24 hour  Intake --  Output 3500 ml  Net -3500 ml   Filed Weights   05/09/23 1816  Weight: 82.8 kg  Weight change:  Body mass index is 23.44 kg/m.   Physical Exam: General exam: Pleasant, middle-aged, looks older than his stated age Skin: No rashes, lesions or ulcers. HEENT: Atraumatic, normocephalic, no obvious bleeding Lungs: Mild end expiratory wheezing bilaterally, no crackles CVS: Mild tachycardia, no murmur GI/Abd: soft, nontender, nondistended, bowel sound present CNS: Somnolent.  Opens eyes on command.  Confused.  Mumbles and falls back asleep.  Was restless overnight Extremities: No pedal edema, no calf tenderness  Data Review: I have personally reviewed the laboratory data and studies available.  F/u labs  Unresulted Labs (From admission, onward)     Start     Ordered   05/09/23 1825  MRSA Next Gen by PCR, Nasal  Once,   R        05/09/23 1824   05/09/23 1526  Hepatitis B surface antibody,quantitative  (New Admission Hemo Labs (Hepatitis B))  Once,   R        05/09/23 1529            Total time spent in review of labs and imaging, patient evaluation, formulation of plan, documentation and communication with family: 45  minutes  Signed, Lorin Glass, MD [facility] 05/10/2023

## 2023-05-10 NOTE — Progress Notes (Signed)
[facility] Progress Note  Subjective: seen in room, pleasant but confused  Vitals:   05/10/23 0005 05/10/23 0141 05/10/23 0608 05/10/23 0731  BP: (!) 156/107 (!) 179/87 (!) 142/82 (!) 157/89  Pulse: (!) 106 (!) 106 92 98  Resp: 17  18 18   Temp: 98.5 F (36.9 C) 98.1 F (36.7 C)    TempSrc:  Oral    SpO2: 98% 98% 92% 94%  Weight:      Height:        Exam: Gen alert, confused No jvd or bruits Chest clear bilat to bases RRR no RG Abd soft ntnd no mass or ascites +bs GU normal male Ext no LE edema Neuro is alert, Ox 2, nf    RUA AVF+bruit           Renal-related home meds: - norvasc 5- 10 mg  - auryxia 2 ac tid - coreg 6.25 bid - lisinopril 5- 20 mg every day - lyrica 25 hs - lokelma 10gm wed and sundays      OP HD: TTS G-O  4h   400/800   87.5kg  400/800   RUA AVF  Heparin 4000 - last OP HD 11/07, post wt 87kg - get's to his dry wt and about 0.5kg under most sessions last 3 wks - rocaltrol 0.75 mcg - mircera 30 mcg IV q 4, last 10/31, next 11/28       Assessment/ Plan: Acute hypoxic resp failure - w/ pulm edema on CXR, missed HD. Suspect vol overload. Is not on O2 at home. Got HD last night w/ 3.5 L off. Is on 2L [place] this am. Cont to lower vol w/ HD tomorrow.  AMS - creat 16 which is high for him, but no asterixis. No better after HD x 1. Could still be uremic. Follow.  ESKD - on HD TTS. Last HD 11/07. Had HD here 11/12. Next HD tomorrow.  HTN - poorly controlled. Got IV meds here in ED. BP's better today.  Volume - +pulm edema on admit CXR. Cont to lower vol w/ HD as tol  Anemia of eskd - Hb 11, next esa due on 11/28. Follow.  MBD ckd - CCa and phos are in range. Cont binders and po vdra.           Vinson Moselle MD  [facility] 05/10/2023, 2:29 PM  Recent Labs  Lab 05/09/23 1108 05/09/23 1429 05/10/23 0457  HGB 11.3*  --  11.1*  ALBUMIN 3.4*  --   --   CALCIUM 9.5  --  9.3  PHOS  --  3.9  --   CREATININE 16.11*  --  10.97*  K 5.1  --   4.6   No results for input(s): "IRON", "TIBC", "FERRITIN" in the last 168 hours. Inpatient medications:  acetaminophen  1,000 mg Oral TID   amLODipine  10 mg Oral QHS   calcitRIOL  0.75 mcg Oral Q T,Th,Sat-1800   carvedilol  6.25 mg Oral BID   Chlorhexidine Gluconate Cloth  6 each Topical Q0600   [START ON 05/11/2023] citalopram  20 mg Oral Daily   ezetimibe  10 mg Oral Daily   feeding supplement (NEPRO CARB STEADY)  237 mL Oral BID BM   ferric citrate  420 mg Oral TID WC   folic acid  1 mg Oral Daily   heparin  5,000 Units Subcutaneous Q8H   insulin aspart  0-5 Units Subcutaneous QHS   insulin aspart  0-9 Units Subcutaneous TID WC  levETIRAcetam  500 mg Oral BID   lisinopril  10 mg Oral Daily   melatonin  3 mg Oral QHS   multivitamin with minerals  1 tablet Oral Daily   nicotine  21 mg Transdermal Once   QUEtiapine  25 mg Oral BID   rosuvastatin  10 mg Oral Daily   thiamine  100 mg Oral Daily   Or   thiamine  100 mg Intravenous Daily    albuterol, hydrALAZINE, LORazepam, LORazepam **OR** LORazepam, oxyCODONE

## 2023-05-10 NOTE — Plan of Care (Signed)
  Problem: Coping: Goal: Ability to adjust to condition or change in health will improve Outcome: Progressing   Problem: Fluid Volume: Goal: Ability to maintain a balanced intake and output will improve Outcome: Progressing   Problem: Metabolic: Goal: Ability to maintain appropriate glucose levels will improve Outcome: Progressing   Problem: Nutritional: Goal: Maintenance of adequate nutrition will improve Outcome: Progressing   Problem: Tissue Perfusion: Goal: Adequacy of tissue perfusion will improve Outcome: Progressing   Problem: Activity: Goal: Risk for activity intolerance will decrease Outcome: Progressing   Problem: Nutrition: Goal: Adequate nutrition will be maintained Outcome: Progressing   Problem: Coping: Goal: Level of anxiety will decrease Outcome: Progressing   Problem: Elimination: Goal: Will not experience complications related to bowel motility Outcome: Progressing Goal: Will not experience complications related to urinary retention Outcome: Progressing   Problem: Pain Management: Goal: General experience of comfort will improve Outcome: Progressing   Problem: Safety: Goal: Ability to remain free from injury will improve Outcome: Progressing   Problem: Skin Integrity: Goal: Risk for impaired skin integrity will decrease Outcome: Progressing

## 2023-05-10 NOTE — TOC Initial Note (Signed)
Transition of Care (TOC) - Initial/Assessment Note    Patient Details  Name: [name] Gilcrest Sr. MRN: [id number] Date of Birth: 02/10/1961  Transition of Care [facility]) CM/SW Contact:    Kingsley Plan, RN Phone Number: 05/10/2023, 10:10 AM  Clinical Narrative:                  NCM spoke to patient's daughter Amy at bedside. Patient asleep . Patient from home with his mother. Patient has cane and walker at home.     Patient has not had ETOH in months. Only uses Marijuana to control nausea .   PT/OT to see. Patient currently observation . Daughter understands medicare will only cover SNF stay if he meets inpatient status three midnights.   UR case manager will follow patient and discuss with medical advisor. If patient meets inpatient status UR CM will call attending for order.  Barriers to Discharge: Continued Medical Work up   Patient Goals and CMS Choice Patient states their goals for this hospitalization and ongoing recovery are:: Patient asleep          Expected Discharge Plan and Services       Living arrangements for the past 2 months: Single Family Home                   DME Agency: NA       HH Arranged: NA          Prior Living Arrangements/Services Living arrangements for the past 2 months: Single Family Home Lives with:: Parents Patient language and need for interpreter reviewed:: Yes        Need for Family Participation in Patient Care: Yes (Comment) Care giver support system in place?: Yes (comment) Current home services: DME Criminal Activity/Legal Involvement Pertinent to Current Situation/Hospitalization: No - Comment as needed  Activities of Daily Living   ADL Screening (condition at time of admission) Independently performs ADLs?: Yes (appropriate for developmental age) Does the patient have a NEW difficulty with bathing/dressing/toileting/self-feeding that is expected to last >3 days?: Yes (Initiates electronic notice to provider for  possible OT consult) Does the patient have a NEW difficulty with getting in/out of bed, walking, or climbing stairs that is expected to last >3 days?: Yes (Initiates electronic notice to provider for possible PT consult) Does the patient have a NEW difficulty with communication that is expected to last >3 days?: Yes (Initiates electronic notice to provider for possible SLP consult) Is the patient deaf or have difficulty hearing?: Yes Does the patient have difficulty seeing, even when wearing glasses/contacts?: Yes Does the patient have difficulty concentrating, remembering, or making decisions?: Yes  Permission Sought/Granted                  Emotional Assessment              Admission diagnosis:  Acute metabolic encephalopathy [G93.41] Encephalopathy, unspecified type [G93.40] Patient Active Problem List   Diagnosis Date Noted   Acute metabolic encephalopathy 05/09/2023   CKD (chronic kidney disease) stage 5, GFR less than 15 ml/min (HCC) 08/04/2022   ESRD (end stage renal disease) (HCC) 08/04/2022   Shakes 08/04/2022   Disorder of phosphorus metabolism, unspecified 07/28/2021   Bradycardia with 41-50 beats per minute 05/05/2021   Allergy, unspecified, initial encounter 03/26/2021   Anaphylactic shock, unspecified, initial encounter 03/26/2021   Anemia in chronic kidney disease 03/26/2021   Complication of vascular dialysis catheter 03/26/2021   Diarrhea, unspecified 03/26/2021   Fever, unspecified 03/26/2021  Iron deficiency anemia, unspecified 03/26/2021   Other specified coagulation defects (HCC) 03/26/2021   Pruritus, unspecified 03/26/2021   Secondary hyperparathyroidism of renal origin (HCC) 03/26/2021   Type 2 diabetes mellitus with diabetic peripheral angiopathy without gangrene (HCC) 03/26/2021   Atherosclerotic heart disease of native coronary artery without angina pectoris 03/26/2021   Shortness of breath 03/26/2021   Type 2 diabetes mellitus with other  diabetic kidney complication (HCC) 03/26/2021   Arthritis 03/10/2021   CHF (congestive heart failure) (HCC) 03/10/2021   Cough 03/10/2021   Heart murmur 03/10/2021   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   CKD (chronic kidney disease) 07/20/2018   Edema 07/20/2018   Acute renal failure superimposed on stage 2 chronic kidney disease (HCC) 06/12/2018   Hyperkalemia 06/12/2018   Bilateral lower extremity edema 06/11/2018   Seizure (HCC) 10/18/2017   Alcohol abuse 10/18/2017   Neuropathy 10/18/2017   Coronary artery calcification seen on CT scan 10/09/2017   Anxiety and depression 10/06/2017   Dyspnea 10/06/2017   CAD (coronary artery disease) 10/06/2017   COPD (chronic obstructive pulmonary disease) (HCC) 09/29/2017   Tobacco abuse 09/29/2017   Hypertension 09/29/2017   Myelopathy (HCC) 09/14/2017   Type 2 diabetes mellitus (HCC) 10/17/2011   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   PCP:  Hoy Register, MD Pharmacy:   [facility] [address] [place] [address] Phone: [phone] Fax: [phone]  [id number] [place], [place] - [address] [place] [address] Phone: [phone] Fax: [phone]  [facility] [address] [place] [address] Phone: [phone] Fax: [phone]     Social Determinants of Health (SDOH) Social History: SDOH Screenings   Food Insecurity: No Food Insecurity (05/09/2023)  Housing: Low Risk  (05/09/2023)  Transportation Needs: No Transportation Needs (05/09/2023)  Utilities: Not At Risk (05/09/2023)  Alcohol Screen: Low Risk  (09/03/2021)  Depression (PHQ2-9): Low Risk  (09/12/2022)  Financial Resource Strain: Low Risk  (09/12/2022)  Physical Activity: Inactive (09/12/2022)  Social Connections: Moderately Isolated (09/03/2021)  Stress: No Stress Concern  Present (09/12/2022)  Tobacco Use: High Risk (05/09/2023)   SDOH Interventions:     Readmission Risk Interventions     No data to display

## 2023-05-10 NOTE — Evaluation (Signed)
Occupational Therapy Evaluation Patient Details Name: [name] Lagunes Sr. MRN: [id number] DOB: 1961-03-20 Today's Date: 05/10/2023   History of Present Illness Pt is a 62 year old man admitted on 11/12 with AMS. Pt missed two HD sessions. Daughter reports multiple falls in the week leading up to admission. PMH: ESRD, CAD, CHF, DM, seizures, peripheral neuropathy, anemia, HLD, COPD.   Clinical Impression   Pt walks with a cane, drives and is modified independent in ADLs and IADLs. He resides with his mother, but also has 2 supportive daughters. Pt presents with fatigue, generalized weakness, impaired cognition and zero sitting balance. Bed mobility requires max to total assist. Pt is dependent in all ADLs. VSS with pt currently on 2L O2. Patient will benefit from continued inpatient follow up therapy, <3 hours/day.        If plan is discharge home, recommend the following: Two people to help with walking and/or transfers;Two people to help with bathing/dressing/bathroom;Assistance with cooking/housework;Assistance with feeding;Direct supervision/assist for medications management;Direct supervision/assist for financial management;Assist for transportation;Help with stairs or ramp for entrance    Functional Status Assessment  Patient has had a recent decline in their functional status and demonstrates the ability to make significant improvements in function in a reasonable and predictable amount of time.  Equipment Recommendations       Recommendations for Other Services       Precautions / Restrictions Precautions Precautions: Fall Restrictions Weight Bearing Restrictions: No      Mobility Bed Mobility Overal bed mobility: Needs Assistance Bed Mobility: Supine to Sit, Sit to Supine     Supine to sit: Max assist, HOB elevated Sit to supine: Total assist   General bed mobility comments: decreased initiation, assist for all aspects    Transfers                   General  transfer comment: deferred      Balance Overall balance assessment: Needs assistance Sitting-balance support: Feet unsupported, Bilateral upper extremity supported Sitting balance-Leahy Scale: Zero                                     ADL either performed or assessed with clinical judgement   ADL                                         General ADL Comments: dependent     Vision Baseline Vision/History: 1 Wears glasses Ability to See in Adequate Light: 0 Adequate       Perception         Praxis         Pertinent Vitals/Pain Pain Assessment Pain Assessment: Faces Faces Pain Scale: Hurts little more Pain Location: back Pain Descriptors / Indicators: Aching, Grimacing, Guarding Pain Intervention(s): Monitored during session, Repositioned     Extremity/Trunk Assessment Upper Extremity Assessment Upper Extremity Assessment: Generalized weakness;Right hand dominant;Difficult to assess due to impaired cognition (asterixis)   Lower Extremity Assessment Lower Extremity Assessment: Defer to PT evaluation   Cervical / Trunk Assessment Cervical / Trunk Assessment: Other exceptions (weakness)   Communication Communication Communication: Difficulty communicating thoughts/reduced clarity of speech   Cognition Arousal: Alert Behavior During Therapy: Flat affect Overall Cognitive Status: Impaired/Different from baseline Area of Impairment: Orientation, Attention, Memory, Following commands, Safety/judgement, Awareness, Problem solving  Orientation Level: Disoriented to, Place, Time, Situation Current Attention Level: Focused Memory: Decreased short-term memory Following Commands: Follows one step commands with increased time, Follows one step commands inconsistently Safety/Judgement: Decreased awareness of safety, Decreased awareness of deficits Awareness: Intellectual Problem Solving: Slow processing, Decreased  initiation, Difficulty sequencing, Requires verbal cues, Requires tactile cues       General Comments       Exercises     Shoulder Instructions      Home Living Family/patient expects to be discharged to:: Private residence Living Arrangements: Parent (mom) Available Help at Discharge: Family;Available 24 hours/day Type of Home: House Home Access: Level entry     Home Layout: One level     Bathroom Shower/Tub: Chief Strategy Officer: Standard     Home Equipment: Shower seat;Cane - single Librarian, academic (2 wheels)          Prior Functioning/Environment Prior Level of Function : Independent/Modified Independent;Driving;History of Falls (last six months)             Mobility Comments: 3 falls at least in the last week,walks with a cane ADLs Comments: mod I        OT Problem List: Decreased strength;Decreased activity tolerance;Impaired balance (sitting and/or standing);Decreased coordination;Decreased cognition;Decreased safety awareness;Pain;Impaired UE functional use;Impaired tone      OT Treatment/Interventions:      OT Goals(Current goals can be found in the care plan section) Acute Rehab OT Goals OT Goal Formulation: With family Time For Goal Achievement: 05/24/23 Potential to Achieve Goals: Good ADL Goals Pt Will Perform Eating: with min assist;sitting Pt Will Perform Grooming: with min assist;sitting Pt Will Perform Upper Body Dressing: with min assist;sitting Pt Will Transfer to Toilet: with mod assist;stand pivot transfer;bedside commode Additional ADL Goal #1: Pt will follow one step commands with 50% accuracy. Additional ADL Goal #2: Pt will demonstrate fair sitting balance in preparation for ADLs.  OT Frequency: Min 1X/week    Co-evaluation              AM-PAC OT "6 Clicks" Daily Activity     Outcome Measure Help from another person eating meals?: Total   Help from another person toileting, which includes using  toliet, bedpan, or urinal?: Total Help from another person bathing (including washing, rinsing, drying)?: Total Help from another person to put on and taking off regular upper body clothing?: Total Help from another person to put on and taking off regular lower body clothing?: Total 6 Click Score: 5   End of Session Equipment Utilized During Treatment: Oxygen (2L)  Activity Tolerance: Patient limited by lethargy Patient left: in bed;with call bell/phone within reach;with bed alarm set;with family/visitor present  OT Visit Diagnosis: Unsteadiness on feet (R26.81);Other abnormalities of gait and mobility (R26.89);Muscle weakness (generalized) (M62.81);History of falling (Z91.81);Pain;Other symptoms and signs involving cognitive function                Time: 5784-6962 OT Time Calculation (min): 44 min Charges:  OT General Charges $OT Visit: 1 Visit OT Evaluation $OT Eval Moderate Complexity: 1 Mod OT Treatments $Therapeutic Activity: 23-37 mins Berna Spare, OTR/L Acute Rehabilitation Services Office: [phone]  [name] [name] 05/10/2023, 10:03 AM

## 2023-05-11 DIAGNOSIS — G9341 Metabolic encephalopathy: Secondary | ICD-10-CM | POA: Diagnosis not present

## 2023-05-11 LAB — HEPATITIS B SURFACE ANTIBODY, QUANTITATIVE: Hep B S AB Quant (Post): 886 m[IU]/mL

## 2023-05-11 LAB — GLUCOSE, CAPILLARY
Glucose-Capillary: 94 mg/dL (ref 70–99)
Glucose-Capillary: 120 mg/dL — ABNORMAL HIGH (ref 70–99)

## 2023-05-11 MED ORDER — CITALOPRAM HYDROBROMIDE 20 MG PO TABS
20.0000 mg | ORAL_TABLET | Freq: Every day | ORAL | Status: DC
  Administered 2023-05-12 – 2023-05-15 (×4): 20 mg via ORAL
  Filled 2023-05-11 (×4): qty 1

## 2023-05-11 MED ORDER — HEPARIN SODIUM (PORCINE) 1000 UNIT/ML IJ SOLN
INTRAMUSCULAR | Status: AC
  Filled 2023-05-11: qty 5

## 2023-05-11 MED ORDER — QUETIAPINE FUMARATE 25 MG PO TABS
25.0000 mg | ORAL_TABLET | Freq: Every day | ORAL | Status: DC
  Administered 2023-05-12 – 2023-05-15 (×4): 25 mg via ORAL
  Filled 2023-05-11 (×4): qty 1

## 2023-05-11 NOTE — Evaluation (Signed)
Physical Therapy Evaluation Patient Details Name: [name] Sheffler Sr. MRN: [id number] DOB: 06-17-1961 Today's Date: 05/11/2023  History of Present Illness  Pt is a 62 year old man admitted on 11/12 with AMS. Pt missed two HD sessions. Daughter reports multiple falls in the week leading up to admission. PMH: ESRD, CAD, CHF, DM, seizures, peripheral neuropathy, anemia, HLD, COPD.  Clinical Impression  Pt admitted with above diagnosis. More alert and interactive today from attempt to evaluate yesterday. Pt ex-wife present and helpful with history. Requires up to Max assist to rise to EOB - mostly as a result of his cognitive deficits. Pt shows more independence with simple commands that feel automatic. Ex. He was able to lie back down in bed at a CGA with cues to "Lie down". Unable to stand this date, reduced trunk control and asterixis like tremors. Eventually progressed to CGA at EOB with UE support. Mod assist to scoot along bed. Pt currently with functional limitations due to the deficits listed below (see PT Problem List). Pt will benefit from acute skilled PT to increase their independence and safety with mobility to allow discharge.           If plan is discharge home, recommend the following: Two people to help with walking and/or transfers;A lot of help with bathing/dressing/bathroom;Assistance with cooking/housework;Direct supervision/assist for medications management;Direct supervision/assist for financial management;Assist for transportation;Supervision due to cognitive status   Can travel by private vehicle   No    Equipment Recommendations None recommended by PT  Recommendations for Other Services       Functional Status Assessment Patient has had a recent decline in their functional status and demonstrates the ability to make significant improvements in function in a reasonable and predictable amount of time.     Precautions / Restrictions Precautions Precautions:  Fall Restrictions Weight Bearing Restrictions: No      Mobility  Bed Mobility Overal bed mobility: Needs Assistance Bed Mobility: Supine to Sit, Sit to Supine     Supine to sit: Max assist, HOB elevated Sit to supine: Contact guard assist   General bed mobility comments: Max assist with multimodal cues to rise to EOB. Returning to bed pt requires cues for technique but was able to lift LEs back into bed and lie down without physical assist.    Transfers Overall transfer level: Needs assistance Equipment used: None Transfers: Bed to chair/wheelchair/BSC            Lateral/Scoot Transfers: Mod assist General transfer comment: Mod assist to scoot laterally along bed. Difficulty with forward trunk translation. Cues for technique, briefly clears buttocks from bed to slide several times.    Ambulation/Gait                  Stairs            Wheelchair Mobility     Tilt Bed    Modified Rankin (Stroke Patients Only)       Balance Overall balance assessment: Needs assistance Sitting-balance support: Feet unsupported, Bilateral upper extremity supported Sitting balance-Leahy Scale: Fair Sitting balance - Comments: Progressed from min to CGA                                     Pertinent Vitals/Pain Pain Assessment Pain Assessment: Faces Faces Pain Scale: Hurts little more Pain Location: back Pain Descriptors / Indicators: Aching, Grimacing, Guarding Pain Intervention(s): Limited activity within patient's tolerance,  Monitored during session, Repositioned    Home Living Family/patient expects to be discharged to:: Private residence Living Arrangements: Parent (mom) Available Help at Discharge: Family;Available 24 hours/day Type of Home: House Home Access: Level entry       Home Layout: One level Home Equipment: Shower seat;Cane - single Librarian, academic (2 wheels)      Prior Function Prior Level of Function :  Independent/Modified Independent;Driving;History of Falls (last six months)             Mobility Comments: 3 falls at least in the last week,walks with a cane ADLs Comments: mod I     Extremity/Trunk Assessment   Upper Extremity Assessment Upper Extremity Assessment: Defer to OT evaluation    Lower Extremity Assessment Lower Extremity Assessment: Generalized weakness;Difficult to assess due to impaired cognition       Communication   Communication Communication: Difficulty communicating thoughts/reduced clarity of speech  Cognition Arousal: Alert Behavior During Therapy: Flat affect Overall Cognitive Status: No family/caregiver present to determine baseline cognitive functioning Area of Impairment: Orientation, Memory, Following commands, Safety/judgement, Problem solving                 Orientation Level: Disoriented to, Place, Time, Situation   Memory: Decreased short-term memory Following Commands: Follows one step commands with increased time, Follows one step commands inconsistently Safety/Judgement: Decreased awareness of safety, Decreased awareness of deficits   Problem Solving: Slow processing, Decreased initiation, Difficulty sequencing, Requires verbal cues, Requires tactile cues          General Comments General comments (skin integrity, edema, etc.): HR 80s with activity. Ex-wife present, helpful.    Exercises     Assessment/Plan    PT Assessment Patient needs continued PT services  PT Problem List Decreased strength;Decreased balance;Decreased activity tolerance;Decreased mobility;Decreased cognition;Decreased coordination;Decreased knowledge of use of DME;Decreased safety awareness;Decreased knowledge of precautions       PT Treatment Interventions DME instruction;Gait training;Functional mobility training;Therapeutic activities;Therapeutic exercise;Balance training;Neuromuscular re-education;Cognitive remediation;Patient/family education     PT Goals (Current goals can be found in the Care Plan section)  Acute Rehab PT Goals Patient Stated Goal: none stated PT Goal Formulation: With family Time For Goal Achievement: 05/25/23 Potential to Achieve Goals: Good    Frequency Min 1X/week     Co-evaluation               AM-PAC PT "6 Clicks" Mobility  Outcome Measure Help needed turning from your back to your side while in a flat bed without using bedrails?: A Little Help needed moving from lying on your back to sitting on the side of a flat bed without using bedrails?: A Lot Help needed moving to and from a bed to a chair (including a wheelchair)?: Total Help needed standing up from a chair using your arms (e.g., wheelchair or bedside chair)?: Total Help needed to walk in hospital room?: Total Help needed climbing 3-5 steps with a railing? : Total 6 Click Score: 9    End of Session   Activity Tolerance: Patient tolerated treatment well Patient left: in bed;with call bell/phone within reach;with bed alarm set;with family/visitor present   PT Visit Diagnosis: Unsteadiness on feet (R26.81);Repeated falls (R29.6);Muscle weakness (generalized) (M62.81);History of falling (Z91.81);Difficulty in walking, not elsewhere classified (R26.2);Other symptoms and signs involving the nervous system (R29.898)    Time: 4098-1191 PT Time Calculation (min) (ACUTE ONLY): 19 min   Charges:   PT Evaluation $PT Eval Moderate Complexity: 1 Mod   PT General Charges $$ ACUTE PT  VISIT: 1 Visit         Kathlyn Sacramento, PT, DPT [facility]  Rehabilitation Services Physical Therapist Office: [phone] Website: [email or web address]   Berton Mount 05/11/2023, 12:56 PM

## 2023-05-11 NOTE — Progress Notes (Signed)
PROGRESS NOTE  [name] Penta Sr.  DOB: 1961-01-10  PCP: Hoy Register, MD ZOX:[id number]  DOA: 05/09/2023  LOS: 1 day  Hospital Day: 3  Brief narrative: [name] Plan Sr. is a 62 y.o. male with PMH significant for ESRD-HD-TTS, DM2, HTN, HLD, CAD, COPD, history of alcohol use, iron deficiency anemia, peripheral neuropathy Patient was brought to the ED by EMS today from home for altered mental status grossly worsening for 3 weeks.  Patient missed his last few dialysis.  He has been feeling brain fog for the last couple of weeks not acting like himself.  Was restless and agitated at home.  He also has a couple of falls due to loss of balance.  He lives with his mother, smokes marijuana. Past history of alcohol use, mom denies recent use.  Patient states last time he drank a little beer was 2 weeks ago with his friends..  Continues to smoke.  In the ED, patient was afebrile, heart rate in 90s and low 100s, blood pressure significantly elevated to 198/99 and has remained elevated for past few hours.  Initially did not require supplemental oxygen.  Later felt short of breath and was put on 4 L of the oxygen by nasal cannula. Labs with WC count 11.1, hemoglobin 11.3, troponin elevated to 149, sodium 133, BUN/creatinine 15/61, ammonia 24 blood alcohol level not elevated.  CT scan of head did not show acute intracranial abnormality, showed chronic small microvascular changes EKG with sinus tachycardia at 103 bpm, QTc 493 ms.  Patient was given IV hydralazine x 2 Admitted to [facility] Nephrology was consulted.  Subjective: Patient was seen and examined this morning. Lying on bed.  Somnolent.  Mother and son at bedside. They state that he slept well last night. Woke up this morning to take his breakfast.  Received last dose of Seroquel at 845 this morning.  Fell asleep again. Nephrology Dr. Arlean Hopping was at bedside as well at the time of my evaluation.  Assessment/Plan: Acute respiratory failure with  hypoxia ESRD-HD-TTS Presented with shortness of breath Started desatting in the ED and required supplemental oxygen Nephrology following for maintenance dialysis.  Acute metabolic encephalopathy History of anxiety, dizziness Reports brain fogginess off and on for last 3 weeks. Could be due to uremia from missed dialysis.  Also could be hypertensive encephalopathy due to significantly elevated blood pressure. MRI brain did not show any acute intracranial abnormality but showed generalized brain atrophy and chronic small vessel ischemic disease. Mental status seems gradually improving.  Slept well last night.  Woke up this morning for breakfast. Received Seroquel earlier this morning and currently asleep.  I would hold morning dose of Seroquel.  Continue Seroquel 25 mg at night.  I would also switch Celexa 20 mg to bedtime.  Continue IV Ativan as needed. Continue to monitor mental status change  Uncontrolled hypertension Blood pressure was elevated to 190s on admission.  Gradually improving, 153. PTA meds-  carvedilol, amlodipine, lisinopril. Unclear compliance.  Currently continued on all.  Blood pressure better.  Continue to monitor.  IV hydralazine as needed  New mild systolic CHF Echocardiogram today showed EF low at 40 to 45%, global LV hypokinesis, grade 2 diastolic dysfunction, RV size, systolic function normal May benefit from cardiology evaluation after mental status improves.    Elevated troponin CAD/HLD Troponin was elevated to 149 >>164 Troponin elevation likely due to elevated blood pressure. EKG without ischemic changes Echo showed global hypokinesis PTA meds-Zetia, Crestor to continue. Recent Labs    05/09/23  1108 05/09/23 1311  TROPONINIHS 149* 164*   Type 2 diabetes mellitus A1c 6.1 on 05/09/2023 PTA meds-none Continue SSI/Accu-Cheks Recent Labs  Lab 05/10/23 0729 05/10/23 1153 05/10/23 1616 05/10/23 2206 05/11/23 0735  GLUCAP 110* 113* 97 98 94    H/o seizure disorder Reports compliance to Keppra.  Continue the same.  COPD Current everyday smoker Continue bronchodilators as needed.  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  On admission, patient said he small amount of beer 2 weeks ago with his friends.  Alcohol level not elevated, ammonia level not elevated Monitored with CIWA protocol.  No withdrawal symptoms in the hospital.  peripheral neuropathy PTA meds-continue pregabalin   Frequent falls  back pain Secondary to multiple falls in recent weeks. No area of point tenderness Scheduled Tylenol and as needed oxycodone ordered PT eval ordered.  Goals of care   Code Status: Full Code    DVT prophylaxis:  heparin injection 5,000 Units Start: 05/09/23 1445   Antimicrobials: None Fluid: None Consultants: Nephrology Family Communication: Multiple family members at bedside  Status: Inpatient Level of care:  Telemetry Medical   Patient is from: Home Needs to continue in-hospital care: Mental status gradually following.  Needs PT eval once more awake Anticipated d/c to: Pending clinical course, pending PT eval   Diet:  Diet Order             Diet renal/carb modified with fluid restriction Diet-HS Snack? Nothing; Fluid restriction: 1200 mL Fluid; Room service appropriate? Yes; Fluid consistency: Thin  Diet effective now                   Scheduled Meds:  acetaminophen  1,000 mg Oral TID   amLODipine  10 mg Oral QHS   calcitRIOL  0.75 mcg Oral Q T,Th,Sat-1800   carvedilol  6.25 mg Oral BID   Chlorhexidine Gluconate Cloth  6 each Topical Q0600   Chlorhexidine Gluconate Cloth  6 each Topical Q0600   citalopram  20 mg Oral Daily   ezetimibe  10 mg Oral Daily   feeding supplement (NEPRO CARB STEADY)  237 mL Oral BID BM   ferric citrate  420 mg Oral TID WC   folic acid  1 mg Oral Daily   heparin  5,000 Units Subcutaneous Q8H   insulin  aspart  0-5 Units Subcutaneous QHS   insulin aspart  0-9 Units Subcutaneous TID WC   levETIRAcetam  500 mg Oral BID   lisinopril  10 mg Oral Daily   melatonin  3 mg Oral QHS   multivitamin with minerals  1 tablet Oral Daily   QUEtiapine  25 mg Oral BID   rosuvastatin  10 mg Oral Daily   thiamine  100 mg Oral Daily   Or   thiamine  100 mg Intravenous Daily    PRN meds: albuterol, hydrALAZINE, LORazepam, LORazepam **OR** LORazepam, oxyCODONE   Infusions:    Antimicrobials: Anti-infectives (From admission, onward)    None       Objective: Vitals:   05/11/23 0417 05/11/23 0733  BP: (!) 149/76 (!) 146/73  Pulse: 66 78  Resp:  19  Temp: (!) 97.4 F (36.3 C) 98 F (36.7 C)  SpO2: 98% 97%   No intake or output data in the 24 hours ending 05/11/23 1049  Filed Weights   05/09/23 1816  Weight: 82.8 kg   Weight change:  Body mass index  is 23.44 kg/m.   Physical Exam: General exam: Pleasant, middle-aged, looks older than his stated age Skin: No rashes, lesions or ulcers. HEENT: Atraumatic, normocephalic, no obvious bleeding Lungs: Mild end expiratory wheezing bilaterally, no crackles CVS: Mild tachycardia, no murmur GI/Abd: soft, nontender, nondistended, bowel sound present CNS: Somnolent.  Opens eyes on touch.  Slept well last night.  Not restless this morning. Extremities: No pedal edema, no calf tenderness  Data Review: I have personally reviewed the laboratory data and studies available.  F/u labs  Unresulted Labs (From admission, onward)     Start     Ordered   05/12/23 0500  Basic metabolic panel  Tomorrow morning,   R       Question:  Specimen collection method  Answer:  Lab=Lab collect   05/11/23 0854   05/12/23 0500  CBC with Differential/Platelet  Tomorrow morning,   R       Question:  Specimen collection method  Answer:  Lab=Lab collect   05/11/23 0854   05/09/23 1825  MRSA Next Gen by PCR, Nasal  Once,   R        05/09/23 1824             Total time spent in review of labs and imaging, patient evaluation, formulation of plan, documentation and communication with family: 45 minutes  Signed, Lorin Glass, MD [facility] 05/11/2023

## 2023-05-11 NOTE — Progress Notes (Signed)
   05/11/23 1650  Vitals  Temp 98.1 F (36.7 C)  Pulse Rate 85  Resp 15  BP (!) 146/78  SpO2 100 %  Weight 74.8 kg  Post Treatment  Dialyzer Clearance Lightly streaked  Hemodialysis Intake (mL) 0 mL  Liters Processed 61.8  Fluid Removed (mL) 3000 mL  Tolerated HD Treatment Yes  AVG/AVF Arterial Site Held (minutes) 8 minutes  AVG/AVF Venous Site Held (minutes) 8 minutes   Received patient in bed to unit.  Alert and oriented.  Informed consent signed and in chart.   TX duration:3hrs  Patient tolerated well.  Transported back to the room  Alert, without acute distress.  Hand-off given to patient's nurse.   Access used: RAVF Access issues: none  Total UF removed: 3L Medication(s) given: none    [facility]

## 2023-05-11 NOTE — Progress Notes (Signed)
Heart Failure Navigator Progress Note  Assessed for Heart & Vascular TOC clinic readiness.  Patient does not meet criteria due to ESRD on hemodialysis.   Navigator will sign off at this time.    Dawn Fields, BSN, RN Heart Failure Nurse Navigator Secure Chat Only   

## 2023-05-11 NOTE — Progress Notes (Signed)
[facility] Progress Note  Subjective: seen in room, pleasant , less confused  Vitals:   05/11/23 1430 05/11/23 1500 05/11/23 1530 05/11/23 1600  BP: (!) 131/93 133/74 121/80 136/79  Pulse: 81 86 86 80  Resp: 20 17 16 18   Temp:      TempSrc:      SpO2: 100% 100% 100% 100%  Weight:      Height:        Exam: Gen sleepy, less confused No jvd or bruits Chest clear bilat to bases RRR no RG Abd soft ntnd no mass or ascites +bs GU normal male Ext no LE edema Neuro is alert, Ox 2, nf    RUA AVF+bruit           Renal-related home meds: - norvasc 5- 10 mg  - auryxia 2 ac tid - coreg 6.25 bid - lisinopril 5- 20 mg every day - lyrica 25 hs - lokelma 10gm wed and sundays      OP HD: TTS G-O  4h   400/800   87.5kg  400/800   RUA AVF  Heparin 4000 - last OP HD 11/07, post wt 87kg - get's to his dry wt and about 0.5kg under most sessions last 3 wks - rocaltrol 0.75 mcg - mircera 30 mcg IV q 4, last 10/31, next 11/28       Assessment/ Plan: Acute hypoxic resp failure - w/ pulm edema on admit CXR, missed HD. Suspected vol overload. Is not on O2 at home. Got HD last night w/ 3.5 L off. Is on 2L [place] this am. Lowering vol further w/ HD today.  AMS - creat 16 which is high for him, but no asterixis. Not better after HD x 1. Could still be uremic. HD today.  ESKD - on HD TTS. Last HD 11/07. Had HD here 11/12. HD today.  HTN - poorly controlled. Got IV meds here in ED. BP's better.  Volume - as above #1 Anemia of eskd - Hb 11, next esa due on 11/28. Follow.  MBD ckd - CCa and phos are in range. Cont binders and po vdra.           Vinson Moselle MD  [facility] 05/11/2023, 4:14 PM  Recent Labs  Lab 05/09/23 1108 05/09/23 1429 05/10/23 0457  HGB 11.3*  --  11.1*  ALBUMIN 3.4*  --   --   CALCIUM 9.5  --  9.3  PHOS  --  3.9  --   CREATININE 16.11*  --  10.97*  K 5.1  --  4.6   No results for input(s): "IRON", "TIBC", "FERRITIN" in the last 168 hours. Inpatient  medications:  acetaminophen  1,000 mg Oral TID   amLODipine  10 mg Oral QHS   calcitRIOL  0.75 mcg Oral Q T,Th,Sat-1800   carvedilol  6.25 mg Oral BID   Chlorhexidine Gluconate Cloth  6 each Topical Q0600   Chlorhexidine Gluconate Cloth  6 each Topical Q0600   [START ON 05/12/2023] citalopram  20 mg Oral QHS   ezetimibe  10 mg Oral Daily   feeding supplement (NEPRO CARB STEADY)  237 mL Oral BID BM   ferric citrate  420 mg Oral TID WC   folic acid  1 mg Oral Daily   heparin  5,000 Units Subcutaneous Q8H   heparin sodium (porcine)       insulin aspart  0-5 Units Subcutaneous QHS   insulin aspart  0-9 Units Subcutaneous TID WC   levETIRAcetam  500 mg Oral BID   lisinopril  10 mg Oral Daily   melatonin  3 mg Oral QHS   multivitamin with minerals  1 tablet Oral Daily   [START ON 05/12/2023] QUEtiapine  25 mg Oral QHS   rosuvastatin  10 mg Oral Daily   thiamine  100 mg Oral Daily   Or   thiamine  100 mg Intravenous Daily    albuterol, heparin sodium (porcine), hydrALAZINE, LORazepam, oxyCODONE

## 2023-05-12 DIAGNOSIS — G9341 Metabolic encephalopathy: Secondary | ICD-10-CM | POA: Diagnosis not present

## 2023-05-12 LAB — BASIC METABOLIC PANEL
Anion gap: 18 — ABNORMAL HIGH (ref 5–15)
BUN: 32 mg/dL — ABNORMAL HIGH (ref 8–23)
CO2: 25 mmol/L (ref 22–32)
Calcium: 9.7 mg/dL (ref 8.9–10.3)
Chloride: 92 mmol/L — ABNORMAL LOW (ref 98–111)
Creatinine, Ser: 10.81 mg/dL — ABNORMAL HIGH (ref 0.61–1.24)
GFR, Estimated: 5 mL/min — ABNORMAL LOW (ref >60)
Glucose, Bld: 107 mg/dL — ABNORMAL HIGH (ref 70–99)
Potassium: 4.5 mmol/L (ref 3.5–5.1)
Sodium: 135 mmol/L (ref 135–145)

## 2023-05-12 LAB — GLUCOSE, CAPILLARY
Glucose-Capillary: 104 mg/dL — ABNORMAL HIGH (ref 70–99)
Glucose-Capillary: 236 mg/dL — ABNORMAL HIGH (ref 70–99)
Glucose-Capillary: 135 mg/dL — ABNORMAL HIGH (ref 70–99)
Glucose-Capillary: 145 mg/dL — ABNORMAL HIGH (ref 70–99)

## 2023-05-12 LAB — CBC WITH DIFFERENTIAL/PLATELET
Abs Immature Granulocytes: 0.03 10*3/uL (ref 0.00–0.07)
Basophils Absolute: 0.1 10*3/uL (ref 0.0–0.1)
Basophils Relative: 1 %
Eosinophils Absolute: 0.2 10*3/uL (ref 0.0–0.5)
Eosinophils Relative: 2 %
HCT: 37.5 % — ABNORMAL LOW (ref 39.0–52.0)
Hemoglobin: 12.5 g/dL — ABNORMAL LOW (ref 13.0–17.0)
Immature Granulocytes: 0 %
Lymphocytes Relative: 23 %
Lymphs Abs: 2.1 10*3/uL (ref 0.7–4.0)
MCH: 30 pg (ref 26.0–34.0)
MCHC: 33.3 g/dL (ref 30.0–36.0)
MCV: 90.1 fL (ref 80.0–100.0)
Monocytes Absolute: 1.1 10*3/uL — ABNORMAL HIGH (ref 0.1–1.0)
Monocytes Relative: 11 %
Neutro Abs: 5.9 10*3/uL (ref 1.7–7.7)
Neutrophils Relative %: 63 %
Platelets: 299 10*3/uL (ref 150–400)
RBC: 4.16 MIL/uL — ABNORMAL LOW (ref 4.22–5.81)
RDW: 14.1 % (ref 11.5–15.5)
WBC: 9.4 10*3/uL (ref 4.0–10.5)
nRBC: 0 % (ref 0.0–0.2)

## 2023-05-12 MED ORDER — CHLORHEXIDINE GLUCONATE CLOTH 2 % EX PADS
6.0000 | MEDICATED_PAD | Freq: Every day | CUTANEOUS | Status: DC
  Administered 2023-05-13 – 2023-05-15 (×2): 6 via TOPICAL

## 2023-05-12 MED ORDER — NICOTINE 21 MG/24HR TD PT24
21.0000 mg | MEDICATED_PATCH | Freq: Every day | TRANSDERMAL | Status: DC
  Administered 2023-05-12 – 2023-05-16 (×5): 21 mg via TRANSDERMAL
  Filled 2023-05-12 (×7): qty 1

## 2023-05-12 NOTE — Evaluation (Signed)
Speech Language Pathology Evaluation Patient Details Name: [name] [name]. MRN: [id number] DOB: 1961/04/29 Today's Date: 05/12/2023 Time: 3086-5784 SLP Time Calculation (min) (ACUTE ONLY): 15 min  Problem List:  Patient Active Problem List   Diagnosis Date Noted   Acute metabolic encephalopathy 05/09/2023   CKD (chronic kidney disease) stage 5, GFR less than 15 ml/min (HCC) 08/04/2022   ESRD (end stage renal disease) (HCC) 08/04/2022   Shakes 08/04/2022   Disorder of phosphorus metabolism, unspecified 07/28/2021   Bradycardia with 41-50 beats per minute 05/05/2021   Allergy, unspecified, initial encounter 03/26/2021   Anaphylactic shock, unspecified, initial encounter 03/26/2021   Anemia in chronic kidney disease 03/26/2021   Complication of vascular dialysis catheter 03/26/2021   Diarrhea, unspecified 03/26/2021   Fever, unspecified 03/26/2021   Iron deficiency anemia, unspecified 03/26/2021   Other specified coagulation defects (HCC) 03/26/2021   Pruritus, unspecified 03/26/2021   Secondary hyperparathyroidism of renal origin (HCC) 03/26/2021   Type 2 diabetes mellitus with diabetic peripheral angiopathy without gangrene (HCC) 03/26/2021   Atherosclerotic heart disease of native coronary artery without angina pectoris 03/26/2021   Shortness of breath 03/26/2021   Type 2 diabetes mellitus with other diabetic kidney complication (HCC) 03/26/2021   Arthritis 03/10/2021   CHF (congestive heart failure) (HCC) 03/10/2021   Cough 03/10/2021   Heart murmur 03/10/2021   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   CKD (chronic kidney disease) 07/20/2018   Edema 07/20/2018   Acute renal failure superimposed on stage 2 chronic kidney disease (HCC) 06/12/2018   Hyperkalemia 06/12/2018   Bilateral lower extremity edema 06/11/2018   Seizure (HCC) 10/18/2017   Alcohol abuse 10/18/2017   Neuropathy 10/18/2017   Coronary artery  calcification seen on CT scan 10/09/2017   Anxiety and depression 10/06/2017   Dyspnea 10/06/2017   CAD (coronary artery disease) 10/06/2017   COPD (chronic obstructive pulmonary disease) (HCC) 09/29/2017   Tobacco abuse 09/29/2017   Hypertension 09/29/2017   Myelopathy (HCC) 09/14/2017   Type 2 diabetes mellitus (HCC) 10/17/2011   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   Past Medical History:  Past Medical History:  Diagnosis Date   Acute renal failure superimposed on stage 2 chronic kidney disease (HCC) 06/12/2018   Alcohol abuse 10/18/2017   Allergy, unspecified, initial encounter 03/26/2021   Anaphylactic shock, unspecified, initial encounter 03/26/2021   Anemia in chronic kidney disease 03/26/2021   Anxiety and depression 10/06/2017   Arthritis    Atherosclerotic heart disease of native coronary artery without angina pectoris 03/26/2021   Bilateral lower extremity edema 06/11/2018   Bradycardia with 41-50 beats per minute 05/05/2021   CAD (coronary artery disease) 10/06/2017   CHF (congestive heart failure) (HCC)    CKD (chronic kidney disease) 07/20/2018   CKD (chronic kidney disease) stage 5, GFR less than 15 ml/min (HCC)    dialysis T/Th/Sa   Complication of vascular dialysis catheter 03/26/2021   COPD (chronic obstructive pulmonary disease) (HCC) 09/29/2017   Coronary artery calcification seen on CT scan 10/09/2017   Cough    Diarrhea, unspecified 03/26/2021   Disorder of phosphorus metabolism, unspecified 07/28/2021   Dyspnea    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   Edema 07/20/2018   ESRD (end stage renal disease) (HCC)    TTHSAT  [name]   Fever, unspecified 03/26/2021   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 Tremors whebn he moves it.   Shortness of breath 03/26/2021   Tobacco abuse 09/29/2017   Type 2 diabetes mellitus (HCC) 10/17/2011   diet controlled - per pt does not check CBG at home- insurance did not approve - 08/13/21   Type 2 diabetes mellitus with diabetic peripheral angiopathy without gangrene (HCC) 03/26/2021   Type 2 diabetes mellitus with other diabetic kidney complication (HCC) 03/26/2021   Past Surgical History:  Past Surgical History:  Procedure Laterality Date   A/V FISTULAGRAM Right 12/06/2021   Procedure: A/V Fistulagram;  Surgeon: Maeola Harman, MD;  Location: [facility] INVASIVE CV LAB;  Service: Cardiovascular;  Laterality: Right;   ANTERIOR CERVICAL DECOMP/DISCECTOMY FUSION N/A 09/14/2017   Procedure: ANTERIOR CERVICAL DECOMPRESSION/DISCECTOMY FUSIONCERVICAL 4- CERVICAL 5, CERVICAL 5- CERVICAL 6;  Surgeon: Donalee Citrin, MD;  Location: [facility] OR;  Service: Neurosurgery;  Laterality: N/A;  ANTERIOR CERVICAL DECOMPRESSION/DISCECTOMY FUSIONCERVICAL 4- CERVICAL 5, CERVICAL 5- CERVICAL 6   AV FISTULA PLACEMENT Left 03/05/2021   Procedure: LEFT UPPER EXTREMITY RADIOBASILIC  ARTERIOVENOUS (AV) FISTULA CREATION;  Surgeon: Leonie Douglas, MD;  Location: [facility] OR;  Service: Vascular;  Laterality: Left;  PERIPHERAL NERVE BLOCK   AV FISTULA PLACEMENT Left 04/21/2021   Procedure: INSERTION OF  LEFT ARM ARTERIOVENOUS (AV) GORE-TEX GRAFT;  Surgeon: Leonie Douglas, MD;  Location: [facility] OR;  Service: Vascular;  Laterality: Left;  PERIPHERAL NERVE BLOCK   AV FISTULA PLACEMENT Right 06/30/2021   Procedure: RIGHT ARM ARTERIOVENOUS (AV) FISTULA;  Surgeon: Leonie Douglas, MD;  Location: [facility] OR;  Service: Vascular;  Laterality: Right;  PERIPHERAL NERVE BLOCK   BASCILIC VEIN  TRANSPOSITION Right 08/16/2021   Procedure: RIGHT ARM SECOND STAGE BASILIC VEIN TRANSPOSITION;  Surgeon: Leonie Douglas, MD;  Location: [facility] OR;  Service: Vascular;  Laterality: Right;  PERIPHERAL NERVE BLOCK, LOCAL GIVEN   IR FLUORO GUIDE CV LINE LEFT  03/25/2021   PERIPHERAL VASCULAR BALLOON ANGIOPLASTY Right 12/06/2021   Procedure: PERIPHERAL VASCULAR BALLOON ANGIOPLASTY;  Surgeon: Maeola Harman, MD;  Location: [facility] INVASIVE CV LAB;  Service: Cardiovascular;  Laterality: Right;   UPPER EXTREMITY VENOGRAPHY Bilateral 06/04/2021   Procedure: CENTRAL  AND UPPER VENOGRAPHY;  Surgeon: Leonie Douglas, MD;  Location: [facility] INVASIVE CV LAB;  Service: Cardiovascular;  Laterality: Bilateral;   HPI:  Deakon Swindell [name] is a 62 yo male presenting to ED 11/12 with AMS. PMH includes ESRD on HD, T2DM, HTN, HLD, CAD, COPD, history of alcohol use, iron deficiency anemia, peripheral neuropathy, prior R parietal MCA CVA   Assessment / Plan / Recommendation Clinical Impression  Pt unable to provide history and no family present in pt's room to confirm PLOF. He presents with significant expressive and receptive language deficits characterized by difficulty with auditory comprehension, two-step commands, sustained attention, divergent naming, repetition, and problem solving. Pt only oriented to self. Given binary choices to identify the year and state, pt perseverated on the most recent cue provided. He was persverative with both verbal and written output. Pt presents with suspected L sided inattention evidenced through the clock drawing task. Suspect pt is experiencing acute on chronic cognitive deficits and may benefit from ongoing SLP f/u. Will  continue following acutely.    SLP Assessment  SLP Recommendation/Assessment: Patient needs continued Speech Lanaguage Pathology Services SLP Visit Diagnosis: Aphasia (R47.01);Cognitive communication deficit (R41.841)    Recommendations for follow up therapy are one  component of a multi-disciplinary discharge planning process, led by the attending physician.  Recommendations may be updated based on patient status, additional functional criteria and insurance authorization.    Follow Up Recommendations  Skilled nursing-short term rehab (<3 hours/day)    Assistance Recommended at Discharge  Frequent or constant Supervision/Assistance  Functional Status Assessment Patient has had a recent decline in their functional status and demonstrates the ability to make significant improvements in function in a reasonable and predictable amount of time.  Frequency and Duration min 2x/week  2 weeks      SLP Evaluation Cognition  Overall Cognitive Status: Impaired/Different from baseline Arousal/Alertness: Awake/alert Orientation Level: Oriented to person;Disoriented to place;Disoriented to time;Disoriented to situation Year: Other (Comment) (perseverative on "Tuesday", unable to state given binary choices) Day of Week: Incorrect Attention: Sustained Sustained Attention: Impaired Sustained Attention Impairment: Verbal basic;Functional basic Awareness: Impaired Awareness Impairment: Intellectual impairment Problem Solving: Impaired Problem Solving Impairment: Verbal basic       Comprehension  Auditory Comprehension Overall Auditory Comprehension: Impaired Yes/No Questions: Impaired Basic Biographical Questions: 76-100% accurate Basic Immediate Environment Questions: 25-49% accurate Complex Questions: 25-49% accurate Commands: Impaired One Step Basic Commands: 75-100% accurate Two Step Basic Commands: 0-24% accurate    Expression Expression Primary Mode of Expression: Verbal Verbal Expression Overall Verbal Expression: Impaired Repetition: Impaired Level of Impairment: Sentence level Naming: Impairment Confrontation: Within functional limits Divergent: 0-24% accurate Verbal Errors: Perseveration Written Expression Dominant Hand: Right   Oral /  Motor  Oral Motor/Sensory Function Overall Oral Motor/Sensory Function: Within functional limits Motor Speech Overall Motor Speech: Appears within functional limits for tasks assessed            Gwynneth Aliment, M.A., CF-SLP Speech Language Pathology, Acute Rehabilitation Services  Secure Chat preferred [phone]  05/12/2023, 5:02 PM

## 2023-05-12 NOTE — Progress Notes (Signed)
Occupational Therapy Treatment Patient Details Name: [name] Basset Sr. MRN: [id number] DOB: 12-Apr-1961 Today's Date: 05/12/2023   History of present illness Pt is a 62 year old man admitted on 11/12 with AMS. Pt missed two HD sessions. Daughter reports multiple falls in the week leading up to admission. PMH: ESRD, CAD, CHF, DM, seizures, peripheral neuropathy, anemia, HLD, COPD.   OT comments  Pt requesting to have BM. Declined use of BSC. Ambulated to bathroom with hand held assist. Pt needs min assist to don gown and boxer shorts. Pt disoriented to time, situation with decreased awareness of safety and deficits. Per family, he has had impaired safety for some time. Declined remaining up in chair. Patient will benefit from continued inpatient follow up therapy, <3 hours/day unless family can provide the level of care pt requires to safely return home.       If plan is discharge home, recommend the following:  A little help with walking and/or transfers;A little help with bathing/dressing/bathroom;Assistance with cooking/housework;Direct supervision/assist for medications management;Direct supervision/assist for financial management;Assist for transportation;Help with stairs or ramp for entrance   Equipment Recommendations  None recommended by OT    Recommendations for Other Services      Precautions / Restrictions Precautions Precautions: Fall Restrictions Weight Bearing Restrictions: No       Mobility Bed Mobility Overal bed mobility: Needs Assistance Bed Mobility: Supine to Sit, Sit to Supine     Supine to sit: Supervision Sit to supine: Supervision   General bed mobility comments: no physical assist, supervision for safety    Transfers Overall transfer level: Needs assistance Equipment used: 1 person hand held assist Transfers: Sit to/from Stand Sit to Stand: Contact guard assist           General transfer comment: hand held assist to walk to bathroom for BM      Balance Overall balance assessment: Needs assistance   Sitting balance-Leahy Scale: Good       Standing balance-Leahy Scale: Poor Standing balance comment: hand held assist                           ADL either performed or assessed with clinical judgement   ADL Overall ADL's : Needs assistance/impaired     Grooming: Wash/dry hands;Standing;Minimal assistance Grooming Details (indicate cue type and reason): decreased thoroughness         Upper Body Dressing : Minimal assistance;Bed level Upper Body Dressing Details (indicate cue type and reason): gown Lower Body Dressing: Contact guard assist Lower Body Dressing Details (indicate cue type and reason): donned boxers Toilet Transfer: Minimal assistance;Ambulation;Regular Toilet;Grab bars   Toileting- Clothing Manipulation and Hygiene: Contact guard assist;Sit to/from stand       Functional mobility during ADLs: Minimal assistance      Extremity/Trunk Assessment              Vision       Perception     Praxis      Cognition Arousal: Alert Behavior During Therapy: Flat affect Overall Cognitive Status: Impaired/Different from baseline Area of Impairment: Orientation, Memory, Following commands, Safety/judgement, Problem solving, Attention                 Orientation Level: Disoriented to, Time, Situation Current Attention Level: Sustained Memory: Decreased short-term memory Following Commands: Follows one step commands with increased time Safety/Judgement: Decreased awareness of safety, Decreased awareness of deficits Awareness: Intellectual Problem Solving: Slow processing, Difficulty sequencing, Requires verbal cues  Exercises      Shoulder Instructions       General Comments      Pertinent Vitals/ Pain       Pain Assessment Pain Assessment: No/denies pain  Home Living                                          Prior Functioning/Environment               Frequency  Min 1X/week        Progress Toward Goals  OT Goals(current goals can now be found in the care plan section)  Progress towards OT goals: Progressing toward goals  Acute Rehab OT Goals OT Goal Formulation: With family Time For Goal Achievement: 05/24/23 Potential to Achieve Goals: Good  Plan      Co-evaluation                 AM-PAC OT "6 Clicks" Daily Activity     Outcome Measure   Help from another person eating meals?: A Little Help from another person taking care of personal grooming?: A Little Help from another person toileting, which includes using toliet, bedpan, or urinal?: A Little Help from another person bathing (including washing, rinsing, drying)?: A Little Help from another person to put on and taking off regular upper body clothing?: A Little Help from another person to put on and taking off regular lower body clothing?: A Little 6 Click Score: 18    End of Session Equipment Utilized During Treatment: Gait belt  OT Visit Diagnosis: Unsteadiness on feet (R26.81);Other abnormalities of gait and mobility (R26.89);Muscle weakness (generalized) (M62.81);History of falling (Z91.81);Pain;Other symptoms and signs involving cognitive function   Activity Tolerance Patient tolerated treatment well   Patient Left in bed;with call bell/phone within reach;with bed alarm set;with family/visitor present   Nurse Communication          Time: 4132-4401 OT Time Calculation (min): 27 min  Charges: OT General Charges $OT Visit: 1 Visit OT Treatments $Self Care/Home Management : 23-37 mins  Berna Spare, OTR/L Acute Rehabilitation Services Office: [phone]   Evern Bio 05/12/2023, 10:30 AM

## 2023-05-12 NOTE — Progress Notes (Signed)
[facility] Progress Note  Subjective: seen in room, pleasant , more interactive yet still not Ox 3  Vitals:   05/11/23 1933 05/11/23 2358 05/12/23 0619 05/12/23 0725  BP: (!) 153/77 139/74 (!) 168/151 (!) 150/82  Pulse: 72 76 77 72  Resp:   18   Temp: 98.2 F (36.8 C) 98.3 F (36.8 C) 97.9 F (36.6 C) 98.4 F (36.9 C)  TempSrc: Oral Oral Oral Oral  SpO2: 96% 96% 100% 99%  Weight:      Height:        Exam: Gen alert and interacting, still only Ox 1 No jvd or bruits Chest clear bilat to bases RRR no RG Abd soft ntnd no mass or ascites +bs GU normal male Ext no LE edema Neuro is alert, Ox 2, nf    RUA AVF+bruit    Renal-related home meds: - norvasc 5- 10 mg  - auryxia 2 ac tid - coreg 6.25 bid - lisinopril 5- 20 mg every day - lyrica 25 hs - lokelma 10gm wed and sundays      OP HD: TTS G-O  4h   400/800   87.5kg  400/800   RUA AVF  Heparin 4000 - last OP HD 11/07, post wt 87kg - get's to his dry wt and about 0.5kg under most sessions last 3 wks - rocaltrol 0.75 mcg - mircera 30 mcg IV q 4, last 10/31, next 11/28       Assessment/ Plan: Acute hypoxic resp failure - w/ missed HD and pulm edema by CXR on admit. Suspected vol overload. Is not on O2 at home. Has HD x 2 w/ 3.5 L and 3L off for total 6.5 L UF.  4L [place] on admit and down to room air now. Vol overload resolved.  AMS - remains disoriented but more alert and interacting well.  Do no think he is uremic after HD x 2 here.  ESKD - on HD TTS. Last outpt HD 11/07. Had HD here x 2. Next HD Sat HTN - poorly controlled upon arrival. BP's under control now.  Volume - 12kg under dry wt, looks euvolemic on exam now. Check bnp. UF 2-2.5 L w/ next HD.  Anemia of eskd - Hb 11, next esa due on 11/28. Follow.  MBD ckd - CCa and phos are in range. Cont binders and po vdra.           Vinson Moselle MD  [facility] 05/12/2023, 2:08 PM  Recent Labs  Lab 05/09/23 1108 05/09/23 1429 05/10/23 0457 05/12/23 0550   HGB 11.3*  --  11.1* 12.5*  ALBUMIN 3.4*  --   --   --   CALCIUM 9.5  --  9.3 9.7  PHOS  --  3.9  --   --   CREATININE 16.11*  --  10.97* 10.81*  K 5.1  --  4.6 4.5   No results for input(s): "IRON", "TIBC", "FERRITIN" in the last 168 hours. Inpatient medications:  acetaminophen  1,000 mg Oral TID   amLODipine  10 mg Oral QHS   calcitRIOL  0.75 mcg Oral Q T,Th,Sat-1800   carvedilol  6.25 mg Oral BID   Chlorhexidine Gluconate Cloth  6 each Topical Q0600   Chlorhexidine Gluconate Cloth  6 each Topical Q0600   citalopram  20 mg Oral QHS   ezetimibe  10 mg Oral Daily   feeding supplement (NEPRO CARB STEADY)  237 mL Oral BID BM   ferric citrate  420 mg Oral TID  WC   folic acid  1 mg Oral Daily   heparin  5,000 Units Subcutaneous Q8H   insulin aspart  0-5 Units Subcutaneous QHS   insulin aspart  0-9 Units Subcutaneous TID WC   levETIRAcetam  500 mg Oral BID   lisinopril  10 mg Oral Daily   melatonin  3 mg Oral QHS   multivitamin with minerals  1 tablet Oral Daily   nicotine  21 mg Transdermal Daily   QUEtiapine  25 mg Oral QHS   rosuvastatin  10 mg Oral Daily   thiamine  100 mg Oral Daily   Or   thiamine  100 mg Intravenous Daily    albuterol, hydrALAZINE, LORazepam, oxyCODONE

## 2023-05-12 NOTE — Plan of Care (Signed)

## 2023-05-12 NOTE — Progress Notes (Signed)
Mobility Specialist Progress Note:   05/12/23 1526  Mobility  Activity Ambulated with assistance in hallway;Ambulated with assistance in room  Level of Assistance Contact guard assist, steadying assist  Assistive Device Front wheel walker  Distance Ambulated (ft) 150 ft  Activity Response Tolerated well  Mobility Referral Yes  $Mobility charge 1 Mobility  Mobility Specialist Start Time (ACUTE ONLY) 1515  Mobility Specialist Stop Time (ACUTE ONLY) 1525  Mobility Specialist Time Calculation (min) (ACUTE ONLY) 10 min   Pt received in bed, agreeable to mobility session. Ambulated in hallway with RW, required cues to avoid obstacles on his L side. Returned pt back to room, all needs met, call bell in reach. Pt's brother at bedside.   Feliciana Rossetti Mobility Specialist Please contact via Special educational needs teacher or  Rehab office at [phone]

## 2023-05-12 NOTE — Progress Notes (Signed)
Physical Therapy Treatment Patient Details Name: [name] Matheny Sr. MRN: [id number] DOB: [birth year] Today's Date: 05/12/2023   History of Present Illness Pt is a 62 year old man admitted on 11/12 with AMS. Pt missed two HD sessions. Daughter reports multiple falls in the week leading up to admission. PMH: ESRD, CAD, CHF, DM, seizures, peripheral neuropathy, anemia, HLD, COPD.    PT Comments  Making progress towards acute functional goals. Pt with mild ataxia ambulating, impaired coordination with FNF of LUE.  Remains confused, oriented to self and location only. Poor awareness of deficits. Leaning towards left, with intermittent LOB, even with RW for support. Educated on safety and awareness. Patient will continue to benefit from skilled physical therapy services to further improve independence with functional mobility. Patient will benefit from continued inpatient follow up therapy, <3 hours/day.    If plan is discharge home, recommend the following: Assistance with cooking/housework;Direct supervision/assist for medications management;Direct supervision/assist for financial management;Assist for transportation;Supervision due to cognitive status;A little help with walking and/or transfers;A little help with bathing/dressing/bathroom   Can travel by private vehicle     Yes  Equipment Recommendations  None recommended by PT    Recommendations for Other Services       Precautions / Restrictions Precautions Precautions: Fall Restrictions Weight Bearing Restrictions: No     Mobility  Bed Mobility Overal bed mobility: Needs Assistance Bed Mobility: Supine to Sit, Sit to Supine     Supine to sit: Supervision Sit to supine: Supervision   General bed mobility comments: no physical assist, supervision for safety, cues for awareness of left lean    Transfers Overall transfer level: Needs assistance Equipment used: 1 person hand held assist, Rolling walker (2 wheels) Transfers: Sit  to/from Stand Sit to Stand: Min assist           General transfer comment: Min assist for balance rising from bed without AD initially. leans towards Lt, poor awareness. With RW for support pt still requiring min assist for balance. Cues for awareness and techniques to compensate.    Ambulation/Gait Ambulation/Gait assistance: Min assist Gait Distance (Feet): 70 Feet Assistive device: Rolling walker (2 wheels) Gait Pattern/deviations: Step-through pattern, Decreased stride length, Ataxic, Drifts right/left, Staggering left, Leaning posteriorly Gait velocity: dec Gait velocity interpretation: <1.31 ft/sec, indicative of household ambulator   General Gait Details: Min assist for balance, intermittent erratic control of RW during turns. Cues for awareness, RW placement and proximity to device for optimal support. Leaning posteriorly, mildly ataxic. No overt buckling.   Stairs             Wheelchair Mobility     Tilt Bed    Modified Rankin (Stroke Patients Only)       Balance Overall balance assessment: Needs assistance Sitting-balance support: Feet unsupported, Bilateral upper extremity supported Sitting balance-Leahy Scale: Good Sitting balance - Comments: CGA     Standing balance-Leahy Scale: Poor Standing balance comment: hand held assist                            Cognition Arousal: Alert Behavior During Therapy: Flat affect Overall Cognitive Status: Impaired/Different from baseline Area of Impairment: Orientation, Memory, Following commands, Safety/judgement, Problem solving, Attention                 Orientation Level: Disoriented to, Time, Situation Current Attention Level: Sustained Memory: Decreased short-term memory Following Commands: Follows one step commands with increased time Safety/Judgement: Decreased awareness of safety,  Decreased awareness of deficits Awareness: Intellectual Problem Solving: Slow processing, Difficulty  sequencing, Requires verbal cues          Exercises      General Comments General comments (skin integrity, edema, etc.): Impaired coordination LUE FNF and heel shin test      Pertinent Vitals/Pain Pain Assessment Pain Assessment: No/denies pain Pain Intervention(s): Monitored during session    Home Living                          Prior Function            PT Goals (current goals can now be found in the care plan section) Acute Rehab PT Goals Patient Stated Goal: none stated PT Goal Formulation: With family Time For Goal Achievement: 05/25/23 Potential to Achieve Goals: Good Progress towards PT goals: Progressing toward goals    Frequency    Min 1X/week      PT Plan      Co-evaluation              AM-PAC PT "6 Clicks" Mobility   Outcome Measure  Help needed turning from your back to your side while in a flat bed without using bedrails?: A Little Help needed moving from lying on your back to sitting on the side of a flat bed without using bedrails?: A Little Help needed moving to and from a bed to a chair (including a wheelchair)?: A Little Help needed standing up from a chair using your arms (e.g., wheelchair or bedside chair)?: A Little Help needed to walk in hospital room?: A Little Help needed climbing 3-5 steps with a railing? : A Lot 6 Click Score: 17    End of Session Equipment Utilized During Treatment: Gait belt Activity Tolerance: Patient tolerated treatment well Patient left: in bed;with call bell/phone within reach;with bed alarm set;with family/visitor present   PT Visit Diagnosis: Unsteadiness on feet (R26.81);Repeated falls (R29.6);Muscle weakness (generalized) (M62.81);History of falling (Z91.81);Difficulty in walking, not elsewhere classified (R26.2);Other symptoms and signs involving the nervous system (R29.898)     Time: 1209-1228 PT Time Calculation (min) (ACUTE ONLY): 19 min  Charges:    $Gait Training: 8-22  mins PT General Charges $$ ACUTE PT VISIT: 1 Visit                     Kathlyn Sacramento, PT, DPT [facility]  Rehabilitation Services Physical Therapist Office: [phone] Website: [email or web address]    Berton Mount 05/12/2023, 1:38 PM

## 2023-05-12 NOTE — Progress Notes (Signed)
PROGRESS NOTE  [name] Santy Sr.  DOB: 1961/06/08  PCP: [name] [name] ONG:[id number]  DOA: 05/09/2023  LOS: 2 days  Hospital Day: 4  Brief narrative: [name] Plan Sr. is a 62 y.o. male with PMH significant for ESRD-HD-TTS, DM2, HTN, HLD, CAD, COPD, history of alcohol use, iron deficiency anemia, peripheral neuropathy Patient was brought to the ED by EMS today from home for altered mental status grossly worsening for 3 weeks.  Patient missed his last few dialysis.  He has been feeling brain fog for the last couple of weeks not acting like himself.  Was restless and agitated at home.  He also has a couple of falls due to loss of balance.  He lives with his mother, smokes marijuana. Past history of alcohol use, mom denies recent use.  Patient states last time he drank a little beer was 2 weeks ago with his friends..  Continues to smoke.  In the ED, patient was afebrile, heart rate in 90s and low 100s, blood pressure significantly elevated to 198/99 and has remained elevated for past few hours.  Initially did not require supplemental oxygen.  Later felt short of breath and was put on 4 L of the oxygen by nasal cannula. Labs with WC count 11.1, hemoglobin 11.3, troponin elevated to 149, sodium 133, BUN/creatinine 15/61, ammonia 24 blood alcohol level not elevated.  CT scan of head did not show acute intracranial abnormality, showed chronic small microvascular changes EKG with sinus tachycardia at 103 bpm, QTc 493 ms.  Patient was given IV hydralazine x 2 Admitted to [facility] Nephrology was consulted.  Subjective: Patient was seen and examined this morning. Propped up in bed.  Much awake.  Cheerful.  Family happy at bedside. Not back to baseline but definitely better Eating better.  Had 1 bowel movement this morning. Next dialysis session tomorrow.  Assessment/Plan: Acute respiratory failure with hypoxia ESRD-HD-TTS Presented with shortness of breath Started desatting in the ED and  required supplemental oxygen Nephrology following for maintenance dialysis.  Acute metabolic encephalopathy History of anxiety, dizziness Reports brain fogginess off and on for last 3 weeks. Could be due to uremia from missed dialysis.  Also could be hypertensive encephalopathy due to significantly elevated blood pressure. MRI brain did not show any acute intracranial abnormality but showed generalized brain atrophy and chronic small vessel ischemic disease. Mental status seems gradually improving.  Slept well at night. Continue Seroquel 25 mg and Celexa 20 mg to bedtime.  Continue IV Ativan as needed. Continue to monitor mental status change  Uncontrolled hypertension Blood pressure was elevated to 190s on admission.  Gradually improving, 153 systolic this morning PTA meds-  carvedilol, amlodipine, lisinopril. Unclear compliance. Currently continued on all.  Blood pressure better.  Continue to monitor.  IV hydralazine as needed  New mild systolic CHF Echocardiogram today showed EF low at 40 to 45%, global LV hypokinesis, grade 2 diastolic dysfunction, RV size, systolic function normal May benefit from cardiology evaluation after mental status improves.    Elevated troponin CAD/HLD Troponin was elevated to 149 >>164 Troponin elevation likely due to elevated blood pressure. EKG without ischemic changes Echo showed global hypokinesis PTA meds-Zetia, Crestor to continue.  Type 2 diabetes mellitus A1c 6.1 on 05/09/2023 PTA meds-none Continue SSI/Accu-Cheks Recent Labs  Lab 05/11/23 0735 05/11/23 1935 05/12/23 0725 05/12/23 1135 05/12/23 1525  GLUCAP 94 120* 104* 236* 135*   H/o seizure disorder Reports compliance to Keppra.  Continue the same.  COPD Current everyday smoker Continue bronchodilators as  needed.  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  On admission, patient said he small amount of beer 2  weeks ago with his friends.  Alcohol level not elevated, ammonia level not elevated Monitored with CIWA protocol.  No withdrawal symptoms in the hospital.  peripheral neuropathy PTA meds-continue pregabalin   Frequent falls  back pain Secondary to multiple falls in recent weeks. No area of point tenderness Scheduled Tylenol and as needed oxycodone ordered PT eval obtained.  SNF recommended.  Goals of care   Code Status: Full Code    DVT prophylaxis:  heparin injection 5,000 Units Start: 05/09/23 1445   Antimicrobials: None Fluid: None Consultants: Nephrology Family Communication: Multiple family members at bedside  Status: Inpatient Level of care:  Telemetry Medical   Patient is from: Home Needs to continue in-hospital care: Mental status improving.  PT recommended SNF. Anticipated d/c to: Pending SNF   Diet:  Diet Order             Diet renal/carb modified with fluid restriction Diet-HS Snack? Nothing; Fluid restriction: 1200 mL Fluid; Room service appropriate? Yes; Fluid consistency: Thin  Diet effective now                   Scheduled Meds:  acetaminophen  1,000 mg Oral TID   amLODipine  10 mg Oral QHS   calcitRIOL  0.75 mcg Oral Q T,Th,Sat-1800   carvedilol  6.25 mg Oral BID   Chlorhexidine Gluconate Cloth  6 each Topical Q0600   Chlorhexidine Gluconate Cloth  6 each Topical Q0600   [START ON 05/13/2023] Chlorhexidine Gluconate Cloth  6 each Topical Q0600   citalopram  20 mg Oral QHS   ezetimibe  10 mg Oral Daily   feeding supplement (NEPRO CARB STEADY)  237 mL Oral BID BM   ferric citrate  420 mg Oral TID WC   folic acid  1 mg Oral Daily   heparin  5,000 Units Subcutaneous Q8H   insulin aspart  0-5 Units Subcutaneous QHS   insulin aspart  0-9 Units Subcutaneous TID WC   levETIRAcetam  500 mg Oral BID   lisinopril  10 mg Oral Daily   melatonin  3 mg Oral QHS   multivitamin with minerals  1 tablet Oral Daily   nicotine  21 mg Transdermal Daily    QUEtiapine  25 mg Oral QHS   rosuvastatin  10 mg Oral Daily   thiamine  100 mg Oral Daily   Or   thiamine  100 mg Intravenous Daily    PRN meds: albuterol, hydrALAZINE, LORazepam, oxyCODONE   Infusions:    Antimicrobials: Anti-infectives (From admission, onward)    None       Objective: Vitals:   05/12/23 0725 05/12/23 1523  BP: (!) 150/82 (!) 162/81  Pulse: 72 70  Resp:    Temp: 98.4 F (36.9 C) 97.8 F (36.6 C)  SpO2: 99% 93%    Intake/Output Summary (Last 24 hours) at 05/12/2023 1540 Last data filed at 05/12/2023 0821 Gross per 24 hour  Intake 360 ml  Output 3000 ml  Net -2640 ml    Filed Weights   05/09/23 1816 05/11/23 1256 05/11/23 1650  Weight: 82.8 kg 77.4 kg 74.8 kg   Weight change:  Body mass index is 21.17 kg/m.   Physical Exam: General exam: Pleasant, middle-aged, looks older than his stated age Skin: No rashes, lesions or  ulcers. HEENT: Atraumatic, normocephalic, no obvious bleeding Lungs: Mild end expiratory wheezing bilaterally, no crackles CVS: Mild tachycardia, no murmur GI/Abd: soft, nontender, nondistended, bowel sound present CNS: Mental status much better.  Oriented to place person.  Significant improvement in last 24 hours. Extremities: No pedal edema, no calf tenderness  Data Review: I have personally reviewed the laboratory data and studies available.  F/u labs  Unresulted Labs (From admission, onward)     Start     Ordered   05/09/23 1825  MRSA Next Gen by PCR, Nasal  Once,   R        05/09/23 1824            Total time spent in review of labs and imaging, patient evaluation, formulation of plan, documentation and communication with family: 45 minutes  Signed, Lorin Glass, [name] 05/12/2023

## 2023-05-12 NOTE — TOC Progression Note (Addendum)
Transition of Care (TOC) - Progression Note    Patient Details  Name: [name] Hashimi Sr. MRN: [id number] Date of Birth: September 10, 1960  Transition of Care [facility]) CM/SW Contact  Dellie Burns [place], [place] Phone Number: 05/12/2023, 2:15 PM  Clinical Narrative:  spoke to pt's dtr Amy and provided current SNF offers. Dtr to review and update SW re choice. SW will follow.   Dellie Burns, MSW, LCSW [phone] (coverage)         Barriers to Discharge: Continued Medical Work up  Expected Discharge Plan and Services       Living arrangements for the past 2 months: Single Family Home                   DME Agency: NA       HH Arranged: NA           Social Determinants of Health (SDOH) Interventions SDOH Screenings   Food Insecurity: No Food Insecurity (05/09/2023)  Housing: Low Risk  (05/09/2023)  Transportation Needs: No Transportation Needs (05/09/2023)  Utilities: Not At Risk (05/09/2023)  Alcohol Screen: Low Risk  (09/03/2021)  Depression (PHQ2-9): Low Risk  (09/12/2022)  Financial Resource Strain: Low Risk  (09/12/2022)  Physical Activity: Inactive (09/12/2022)  Social Connections: Moderately Isolated (09/03/2021)  Stress: No Stress Concern Present (09/12/2022)  Tobacco Use: High Risk (05/09/2023)    Readmission Risk Interventions     No data to display

## 2023-05-13 DIAGNOSIS — G9341 Metabolic encephalopathy: Secondary | ICD-10-CM | POA: Diagnosis not present

## 2023-05-13 LAB — GLUCOSE, CAPILLARY
Glucose-Capillary: 148 mg/dL — ABNORMAL HIGH (ref 70–99)
Glucose-Capillary: 137 mg/dL — ABNORMAL HIGH (ref 70–99)
Glucose-Capillary: 210 mg/dL — ABNORMAL HIGH (ref 70–99)

## 2023-05-13 MED ORDER — HEPARIN SODIUM (PORCINE) 1000 UNIT/ML DIALYSIS: 3000.0000 [IU] | Freq: Once | INTRAMUSCULAR | Status: DC

## 2023-05-13 MED ORDER — PENTAFLUOROPROP-TETRAFLUOROETH EX AERO
1.0000 | INHALATION_SPRAY | CUTANEOUS | Status: DC | PRN
Start: 2023-05-13 — End: 2023-05-13

## 2023-05-13 NOTE — Progress Notes (Signed)
Patient is getting restless ,getting out of the bed ,almost tipped off balance when tried to stand out from the bed .Reminded patient to stay sit in bed. I don't know if it is sundown or being irritated due to other restless /agitated patient opposite to his bay.

## 2023-05-13 NOTE — Progress Notes (Signed)
PROGRESS NOTE  [name] [name] Sr.  DOB: 09/10/1960  PCP: [name] Register, MD WGN:[id number]  DOA: 05/09/2023  LOS: 3 days  Hospital Day: 5  Brief narrative: [name] Plan Sr. is a 62 y.o. male with PMH significant for ESRD-HD-TTS, DM2, HTN, HLD, CAD, COPD, history of alcohol use, iron deficiency anemia, peripheral neuropathy Patient was brought to the ED by EMS today from home for altered mental status grossly worsening for 3 weeks.  Patient missed his last few dialysis.  He has been feeling brain fog for the last couple of weeks not acting like himself.  Was restless and agitated at home.  He also has a couple of falls due to loss of balance.  He lives with his mother, smokes marijuana. Past history of alcohol use, mom denies recent use.  Patient states last time he drank a little beer was 2 weeks ago with his friends..  Continues to smoke.  In the ED, patient was afebrile, heart rate in 90s and low 100s, blood pressure significantly elevated to 198/99 and has remained elevated for past few hours.  Initially did not require supplemental oxygen.  Later felt short of breath and was put on 4 L of the oxygen by nasal cannula. Labs with WC count 11.1, hemoglobin 11.3, troponin elevated to 149, sodium 133, BUN/creatinine 15/61, ammonia 24 blood alcohol level not elevated.  CT scan of head did not show acute intracranial abnormality, showed chronic small microvascular changes EKG with sinus tachycardia at 103 bpm, QTc 493 ms.  Patient was given IV hydralazine x 2 Admitted to [facility] Nephrology was consulted.  Subjective: Patient was seen and examined this morning. Propped up in bed.  Alert, awake.  Mental status gradually improving.  Sister at bedside. Pending dialysis today   Assessment/Plan: Acute respiratory failure with hypoxia ESRD-HD-TTS Presented with shortness of breath Started desatting in the ED and required supplemental oxygen Nephrology following for maintenance dialysis.  Next  dialysis today. Currently on room air.  Acute metabolic encephalopathy History of anxiety, dizziness Reports brain fogginess off and on for last 3 weeks. Could be due to uremia from missed dialysis.  Also could be hypertensive encephalopathy due to significantly elevated blood pressure. MRI brain did not show any acute intracranial abnormality but showed generalized brain atrophy and chronic small vessel ischemic disease. Mental status seems gradually improving. Continue Seroquel 25 mg and Celexa 20 mg to bedtime.  Continue IV Ativan as needed. Continue to monitor mental status change  Uncontrolled hypertension Blood pressure was elevated to 190s on admission.  Gradually improving, 153 systolic this morning PTA meds-  carvedilol, amlodipine, lisinopril. Unclear compliance. Currently continued on all.  Blood pressure mostly under 150.Marland Kitchen  Continue to monitor.  IV hydralazine as needed  New mild systolic CHF Echocardiogram today showed EF low at 40 to 45%, global LV hypokinesis, grade 2 diastolic dysfunction, RV size, systolic function normal May benefit from cardiology evaluation after mental status improves.    Elevated troponin CAD/HLD Troponin was elevated to 149 >>164 Troponin elevation likely due to elevated blood pressure. EKG without ischemic changes Echo showed global hypokinesis PTA meds-Zetia, Crestor to continue.  Type 2 diabetes mellitus A1c 6.1 on 05/09/2023 PTA meds-none Continue SSI/Accu-Cheks Recent Labs  Lab 05/12/23 0725 05/12/23 1135 05/12/23 1525 05/12/23 1932 05/13/23 0745  GLUCAP 104* 236* 135* 145* 148*   H/o seizure disorder Reports compliance to Keppra.  Continue the same.  COPD Current everyday smoker Continue bronchodilators as needed.  Counseled to quit smoking.  H/o alcohol  use Used to be a heavy alcohol user in the past but stopped in 2019.  On admission, patient said he small amount of beer 2 weeks ago with his friends.  Alcohol level  not elevated, ammonia level not elevated Monitored with CIWA protocol.  No withdrawal symptoms in the hospital.  peripheral neuropathy PTA meds-continue pregabalin   Frequent falls  back pain Secondary to multiple falls in recent weeks. No area of point tenderness Scheduled Tylenol and as needed oxycodone ordered PT eval obtained.  SNF recommended.  Goals of care   Code Status: Full Code    DVT prophylaxis:  heparin injection 5,000 Units Start: 05/09/23 1445   Antimicrobials: None Fluid: None Consultants: Nephrology Family Communication: Sister at bedside  Status: Inpatient Level of care:  Telemetry Medical   Patient is from: Home Needs to continue in-hospital care: Mental status improving.  PT recommended SNF. Anticipated d/c to: Pending SNF   Diet:  Diet Order             Diet renal/carb modified with fluid restriction Diet-HS Snack? Nothing; Fluid restriction: 1200 mL Fluid; Room service appropriate? Yes; Fluid consistency: Thin  Diet effective now                   Scheduled Meds:  amLODipine  10 mg Oral QHS   calcitRIOL  0.75 mcg Oral Q T,Th,Sat-1800   carvedilol  6.25 mg Oral BID   Chlorhexidine Gluconate Cloth  6 each Topical Q0600   Chlorhexidine Gluconate Cloth  6 each Topical Q0600   Chlorhexidine Gluconate Cloth  6 each Topical Q0600   citalopram  20 mg Oral QHS   ezetimibe  10 mg Oral Daily   feeding supplement (NEPRO CARB STEADY)  237 mL Oral BID BM   ferric citrate  420 mg Oral TID WC   folic acid  1 mg Oral Daily   [START ON 05/14/2023] heparin  3,000 Units Dialysis Once in dialysis   heparin  5,000 Units Subcutaneous Q8H   insulin aspart  0-5 Units Subcutaneous QHS   insulin aspart  0-9 Units Subcutaneous TID WC   levETIRAcetam  500 mg Oral BID   lisinopril  10 mg Oral Daily   melatonin  3 mg Oral QHS   multivitamin with minerals  1 tablet Oral Daily   nicotine  21 mg Transdermal Daily   QUEtiapine  25 mg Oral QHS   rosuvastatin  10  mg Oral Daily   thiamine  100 mg Oral Daily   Or   thiamine  100 mg Intravenous Daily    PRN meds: albuterol, hydrALAZINE, LORazepam, oxyCODONE, pentafluoroprop-tetrafluoroeth   Infusions:    Antimicrobials: Anti-infectives (From admission, onward)    None       Objective: Vitals:   05/13/23 0444 05/13/23 0742  BP: (!) 149/85 (!) 156/73  Pulse: 64 61  Resp: 18 18  Temp: 97.9 F (36.6 C) 97.8 F (36.6 C)  SpO2: 99% 98%   No intake or output data in the 24 hours ending 05/13/23 1140   Filed Weights   05/09/23 1816 05/11/23 1256 05/11/23 1650  Weight: 82.8 kg 77.4 kg 74.8 kg   Weight change:  Body mass index is 21.17 kg/m.   Physical Exam: General exam: Pleasant, middle-aged, looks older than his stated age Skin: No rashes, lesions or ulcers. HEENT: Atraumatic, normocephalic, no obvious bleeding Lungs: Mild end expiratory wheezing bilaterally, no crackles CVS: Mild tachycardia, no murmur GI/Abd: soft, nontender, nondistended, bowel sound present CNS:  Mental status much better.  Oriented to place person.  Significant improvement in last 48 hours Extremities: No pedal edema, no calf tenderness  Data Review: I have personally reviewed the laboratory data and studies available.  F/u labs  Unresulted Labs (From admission, onward)     Start     Ordered   05/09/23 1825  MRSA Next Gen by PCR, Nasal  Once,   R        05/09/23 1824            Total time spent in review of labs and imaging, patient evaluation, formulation of plan, documentation and communication with family: 25 minutes  Signed, Lorin Glass, MD [facility] 05/13/2023

## 2023-05-13 NOTE — Plan of Care (Signed)
  Problem: Metabolic: Goal: Ability to maintain appropriate glucose levels will improve Outcome: Progressing   Problem: Skin Integrity: Goal: Risk for impaired skin integrity will decrease Outcome: Progressing   Problem: Clinical Measurements: Goal: Respiratory complications will improve Outcome: Progressing   Problem: Activity: Goal: Risk for activity intolerance will decrease Outcome: Progressing

## 2023-05-13 NOTE — Progress Notes (Signed)
   05/13/23 1857  Vitals  Temp 98.5 F (36.9 C)  Pulse Rate (!) 102  Resp 16  BP (!) 145/89  SpO2 100 %  O2 Device Room Air  Post Treatment  Dialyzer Clearance Clear  Hemodialysis Intake (mL) 0 mL  Liters Processed 65.7  Fluid Removed (mL) 2.1 mL  Tolerated HD Treatment Yes  Post-Hemodialysis Comments Pt. tolerated procedure well without difficulties.  AVG/AVF Arterial Site Held (minutes) 7 minutes  AVG/AVF Venous Site Held (minutes) 7 minutes   Received patient in bed to unit.  Alert and oriented.  Informed consent signed and in chart.   TX duration:3 hrs and 11 mins  Patient tolerated well.  Transported back to the room  Alert, without acute distress.  Hand-off given to patient's nurse.   Access used: Yes Access issues: No  Total UF removed: 2100 Medication(s) given: See MAR Post HD VS: See Above Grid Post HD weight: 73.4 kg   [facility]

## 2023-05-13 NOTE — Progress Notes (Signed)
Mobility Specialist Progress Note:   05/13/23 1129  Mobility  Activity Ambulated with assistance in hallway  Level of Assistance Minimal assist, patient does 75% or more  Assistive Device Front wheel walker  Distance Ambulated (ft) 200 ft  Activity Response Tolerated well  Mobility Referral Yes  $Mobility charge 1 Mobility  Mobility Specialist Start Time (ACUTE ONLY) 1115  Mobility Specialist Stop Time (ACUTE ONLY) 1129  Mobility Specialist Time Calculation (min) (ACUTE ONLY) 14 min   Pt received in bed, family in room. Agreeable to mobility session. Ambulated in hallway with RW, MinA required to stabilize and prevent LOB with gait belt. Pt had tendency to drift RW to R side, causing him to walk on L side of RW.  Cues given to correct, pt acknowledged but unable to self correct during session. Returned pt to room, able to reposition self in bed, all needs met.   Feliciana Rossetti Mobility Specialist Please contact via Special educational needs teacher or  Rehab office at [phone]

## 2023-05-13 NOTE — Plan of Care (Signed)

## 2023-05-13 NOTE — Progress Notes (Signed)
[facility] Progress Note  Subjective: seen in room, pleasant   Vitals:   05/12/23 1738 05/12/23 1929 05/13/23 0011 05/13/23 0444  BP: (!) 155/79 (!) 156/73 134/82 (!) 149/85  Pulse: 67 74 63 64  Resp:  17 18 18   Temp:  98.1 F (36.7 C) 98 F (36.7 C) 97.9 F (36.6 C)  TempSrc:  Oral Oral Oral  SpO2:  99% 97% 99%  Weight:      Height:        Exam: Gen alert , no distress No jvd or bruits Chest clear bilat to bases RRR no RG Abd soft ntnd no mass or ascites +bs GU normal male Ext no LE edema Neuro is alert, nonfocal    RUA AVF+bruit    Renal-related home meds: - norvasc 5- 10 mg  - auryxia 2 ac tid - coreg 6.25 bid - lisinopril 5- 20 mg every day - lyrica 25 hs - lokelma 10gm wed and sundays      OP HD: TTS G-O  4h   400/800   87.5kg  400/800   RUA AVF  Heparin 4000 - last OP HD 11/07, post wt 87kg - get's to his dry wt and about 0.5kg under most sessions last 3 wks - rocaltrol 0.75 mcg - mircera 30 mcg IV q 4, last 10/31, next 11/28       Assessment/ Plan: Acute hypoxic resp failure - w/ missed HD and pulm edema by CXR on admit. Suspected vol overload. Is not on O2 at home. Has HD x 2 w/ 3.5 L and 3L off for total 6.5 L UF.  Was 4L [place] on admit, now down room air. Vol overload resolved. Lower dry wt upon dc.  AMS - remains disoriented but more alert and interacting well.  ESKD - on HD TTS. Last outpt HD 11/07. Had HD here x 2 so far. HD today HTN - poorly controlled upon arrival. BP's under control now.  Volume - 12kg under dry wt, euvolemic on exam now. Cont to lower vol as tol.  Anemia of eskd - Hb 11, next esa due on 11/28. Follow.  MBD ckd - CCa and phos are in range. Cont binders and po vdra.           Vinson Moselle MD  [facility] 05/13/2023, 6:39 AM  Recent Labs  Lab 05/09/23 1108 05/09/23 1429 05/10/23 0457 05/12/23 0550  HGB 11.3*  --  11.1* 12.5*  ALBUMIN 3.4*  --   --   --   CALCIUM 9.5  --  9.3 9.7  PHOS  --  3.9  --   --    CREATININE 16.11*  --  10.97* 10.81*  K 5.1  --  4.6 4.5   No results for input(s): "IRON", "TIBC", "FERRITIN" in the last 168 hours. Inpatient medications:  amLODipine  10 mg Oral QHS   calcitRIOL  0.75 mcg Oral Q T,Th,Sat-1800   carvedilol  6.25 mg Oral BID   Chlorhexidine Gluconate Cloth  6 each Topical Q0600   Chlorhexidine Gluconate Cloth  6 each Topical Q0600   Chlorhexidine Gluconate Cloth  6 each Topical Q0600   citalopram  20 mg Oral QHS   ezetimibe  10 mg Oral Daily   feeding supplement (NEPRO CARB STEADY)  237 mL Oral BID BM   ferric citrate  420 mg Oral TID WC   folic acid  1 mg Oral Daily   heparin  5,000 Units Subcutaneous Q8H   insulin aspart  0-5 Units Subcutaneous QHS   insulin aspart  0-9 Units Subcutaneous TID WC   levETIRAcetam  500 mg Oral BID   lisinopril  10 mg Oral Daily   melatonin  3 mg Oral QHS   multivitamin with minerals  1 tablet Oral Daily   nicotine  21 mg Transdermal Daily   QUEtiapine  25 mg Oral QHS   rosuvastatin  10 mg Oral Daily   thiamine  100 mg Oral Daily   Or   thiamine  100 mg Intravenous Daily    albuterol, hydrALAZINE, LORazepam, oxyCODONE

## 2023-05-13 NOTE — Progress Notes (Signed)
MEWS Progress Note  Patient Details Name: [name] Claffey Sr. MRN: [id number] DOB: 11-Aug-1960 Today's Date: 05/13/2023   MEWS Flowsheet Documentation:  Assess: MEWS Score Temp: 98.3 F (36.8 C) BP: (!) 145/68 MAP (mmHg): 95 Pulse Rate: 90 ECG Heart Rate: 80 Resp: 13 Level of Consciousness: Alert SpO2: 97 % O2 Device: Room Air O2 Flow Rate (L/min): 2 L/min Assess: MEWS Score MEWS Temp: 0 MEWS Systolic: 0 MEWS Pulse: 0 MEWS RR: 1 MEWS LOC: 0 MEWS Score: 1 MEWS Score Color: Green Assess: SIRS CRITERIA SIRS Temperature : 0 SIRS Respirations : 0 SIRS Pulse: 0 SIRS WBC: 0 SIRS Score Sum : 0          Santiago Bumpers 05/13/2023, 5:11 PM

## 2023-05-14 DIAGNOSIS — G9341 Metabolic encephalopathy: Secondary | ICD-10-CM | POA: Diagnosis not present

## 2023-05-14 LAB — GLUCOSE, CAPILLARY
Glucose-Capillary: 132 mg/dL — ABNORMAL HIGH (ref 70–99)
Glucose-Capillary: 168 mg/dL — ABNORMAL HIGH (ref 70–99)
Glucose-Capillary: 198 mg/dL — ABNORMAL HIGH (ref 70–99)
Glucose-Capillary: 156 mg/dL — ABNORMAL HIGH (ref 70–99)

## 2023-05-14 MED ORDER — PROCHLORPERAZINE EDISYLATE 10 MG/2ML IJ SOLN
5.0000 mg | Freq: Four times a day (QID) | INTRAMUSCULAR | Status: DC | PRN
  Administered 2023-05-14: 5 mg via INTRAVENOUS
  Filled 2023-05-14: qty 2

## 2023-05-14 MED ORDER — POLYETHYLENE GLYCOL 3350 17 G PO PACK: 17.0000 g | PACK | Freq: Every day | ORAL | Status: DC | PRN

## 2023-05-14 MED ORDER — DEXTROSE 5 % IV SOLN: 500.0000 mg | INTRAVENOUS | Status: DC

## 2023-05-14 NOTE — Progress Notes (Addendum)
PROGRESS NOTE  [name] Hensch Sr.  DOB: 1961/05/16  PCP: Hoy Register, MD EXB:[id number]  DOA: 05/09/2023  LOS: 4 days  Hospital Day: 6  Brief narrative: [name] Plan Sr. is a 62 y.o. male with PMH significant for ESRD-HD-TTS, DM2, HTN, HLD, CAD, COPD, history of alcohol use, iron deficiency anemia, peripheral neuropathy Patient was brought to the ED by EMS today from home for altered mental status grossly worsening for 3 weeks.  Patient missed his last few dialysis.  He has been feeling brain fog for the last couple of weeks not acting like himself.  Was restless and agitated at home.  He also has a couple of falls due to loss of balance.  He lives with his mother, smokes marijuana. Past history of alcohol use, mom denies recent use.  Patient states last time he drank a little beer was 2 weeks ago with his friends..  Continues to smoke.  In the ED, patient was afebrile, heart rate in 90s and low 100s, blood pressure significantly elevated to 198/99 and has remained elevated for past few hours.  Initially did not require supplemental oxygen.  Later felt short of breath and was put on 4 L of the oxygen by nasal cannula. Labs with WC count 11.1, hemoglobin 11.3, troponin elevated to 149, sodium 133, BUN/creatinine 15/61, ammonia 24 blood alcohol level not elevated.  CT scan of head did not show acute intracranial abnormality, showed chronic small microvascular changes EKG with sinus tachycardia at 103 bpm, QTc 493 ms.  Patient was given IV hydralazine x 2 Admitted to [facility] Nephrology was consulted.  Subjective: Patient was seen and examined this morning. Lying on bed.  Not in distress.  Talking fluently.  Oriented to place and person.  Not to time.  Brother at bedside. Pending SNF.  Assessment/Plan: Acute respiratory failure with hypoxia ESRD-HD-TTS Presented with shortness of breath Started desatting in the ED and required supplemental oxygen Nephrology following for maintenance  dialysis.  Next dialysis today. Currently on room air.  Acute metabolic encephalopathy History of anxiety, dizziness Reports brain fogginess off and on for last 3 weeks. Could be due to uremia from missed dialysis.  Also could be hypertensive encephalopathy due to significantly elevated blood pressure. MRI brain did not show any acute intracranial abnormality but showed generalized brain atrophy and chronic small vessel ischemic disease. Mental status seems gradually improving.  Oriented to place and person not to time.  Baseline mental status was normal. Continue Seroquel 25 mg and Celexa 20 mg to bedtime.  Continue IV Ativan as needed. Continue to monitor mental status change  Uncontrolled hypertension Blood pressure was elevated to 190s on admission.  Gradually improving, 153 systolic this morning PTA meds-  carvedilol, amlodipine, lisinopril. Unclear compliance. Currently continued on all.  Blood pressure mostly under 150.Marland Kitchen  Continue to monitor.  IV hydralazine as needed  New mild systolic CHF Echocardiogram today showed EF low at 40 to 45%, global LV hypokinesis, grade 2 diastolic dysfunction, RV size, systolic function normal Depressed EF could be likely due to uncontrolled blood pressure from medication noncompliance. Clinically not volume overloaded.  On dialysis already. Antihypertensives as above. May benefit from cardiology evaluation as an outpatient once mental status improves.    Elevated troponin CAD/HLD Troponin was elevated to 149 >>164 Troponin elevation likely due to elevated blood pressure. EKG without ischemic changes Echo showed global hypokinesis PTA meds-Zetia, Crestor to continue.  Type 2 diabetes mellitus A1c 6.1 on 05/09/2023 PTA meds-none Continue SSI/Accu-Cheks.  Blood sugar  trend as below. Recent Labs  Lab 05/13/23 0745 05/13/23 1145 05/13/23 2112 05/14/23 0759 05/14/23 1212  GLUCAP 148* 137* 210* 132* 168*   H/o seizure disorder Current  Keppra.  COPD Current everyday smoker Continue bronchodilators as needed.  Counseled to quit smoking. Nicotine patch in the hospital  H/o alcohol use Used to be a heavy alcohol user in the past but stopped in 2019.  On admission, patient said he small amount of beer 2 weeks ago with his friends. Alcohol level was not elevated, ammonia level not elevated. Monitored with CIWA protocol.  No withdrawal symptoms in the hospital.  Peripheral neuropathy PTA meds-continue pregabalin   Frequent falls  back pain Secondary to multiple falls in recent weeks. No area of point tenderness Scheduled Tylenol and as needed oxycodone ordered PT eval obtained.  SNF recommended.  Goals of care   Code Status: Full Code    DVT prophylaxis:  heparin injection 5,000 Units Start: 05/09/23 1445   Antimicrobials: None Fluid: None Consultants: Nephrology Family Communication: brother at bedside  Status: Inpatient Level of care:  Telemetry Medical   Patient is from: Home Needs to continue in-hospital care: Mental status improving.  PT recommended SNF. Anticipated d/c to: Pending SNF   Diet:  Diet Order             Diet renal/carb modified with fluid restriction Diet-HS Snack? Nothing; Fluid restriction: 1200 mL Fluid; Room service appropriate? Yes; Fluid consistency: Thin  Diet effective now                   Scheduled Meds:  amLODipine  10 mg Oral QHS   calcitRIOL  0.75 mcg Oral Q T,Th,Sat-1800   carvedilol  6.25 mg Oral BID   Chlorhexidine Gluconate Cloth  6 each Topical Q0600   Chlorhexidine Gluconate Cloth  6 each Topical Q0600   Chlorhexidine Gluconate Cloth  6 each Topical Q0600   citalopram  20 mg Oral QHS   ezetimibe  10 mg Oral Daily   feeding supplement (NEPRO CARB STEADY)  237 mL Oral BID BM   ferric citrate  420 mg Oral TID WC   folic acid  1 mg Oral Daily   heparin  5,000 Units Subcutaneous Q8H   insulin aspart  0-5 Units Subcutaneous QHS   insulin aspart  0-9  Units Subcutaneous TID WC   levETIRAcetam  500 mg Oral BID   lisinopril  10 mg Oral Daily   melatonin  3 mg Oral QHS   multivitamin with minerals  1 tablet Oral Daily   nicotine  21 mg Transdermal Daily   QUEtiapine  25 mg Oral QHS   rosuvastatin  10 mg Oral Daily   thiamine  100 mg Oral Daily   Or   thiamine  100 mg Intravenous Daily    PRN meds: albuterol, hydrALAZINE, LORazepam, oxyCODONE, polyethylene glycol, prochlorperazine   Infusions:    Antimicrobials: Anti-infectives (From admission, onward)    Start     Dose/Rate Route Frequency Ordered Stop   05/14/23 1415  azithromycin (ZITHROMAX) 500 mg in dextrose 5 % 250 mL IVPB  Status:  Discontinued        500 mg 250 mL/hr over 60 Minutes Intravenous Every 24 hours 05/14/23 1317 05/14/23 1318       Objective: Vitals:   05/14/23 0724 05/14/23 1000  BP: 134/83 121/69  Pulse: 77 79  Resp: 17 18  Temp: 98.1 F (36.7 C) 97.9 F (36.6 C)  SpO2: 99% 96%  Intake/Output Summary (Last 24 hours) at 05/14/2023 1457 Last data filed at 05/14/2023 1435 Gross per 24 hour  Intake 480 ml  Output 2.1 ml  Net 477.9 ml     Filed Weights   05/11/23 1650 05/13/23 1444 05/13/23 1855  Weight: 74.8 kg 75.5 kg (S) 73.4 kg   Weight change:  Body mass index is 20.78 kg/m.   Physical Exam: General exam: Pleasant, middle-aged, looks older than his stated age. Skin: No rashes, lesions or ulcers. HEENT: Atraumatic, normocephalic, no obvious bleeding Lungs: Clear to auscultation bilaterally CVS: Regular rate and rhythm, no murmur GI/Abd: soft, nontender, nondistended, bowel sound present CNS: Mental status much better. Oriented to place and person.  Extremities: No pedal edema, no calf tenderness.  Data Review: I have personally reviewed the laboratory data and studies available.  F/u labs  Unresulted Labs (From admission, onward)     Start     Ordered   05/09/23 1825  MRSA Next Gen by PCR, Nasal  Once,   R         05/09/23 1824            Total time spent in review of labs and imaging, patient evaluation, formulation of plan, documentation and communication with family: 25 minutes  Signed, Lorin Glass, MD [facility] 05/14/2023

## 2023-05-14 NOTE — Progress Notes (Signed)
[facility] Progress Note  Subjective: seen in room. 2.1 L off w/ HD yest  Vitals:   05/13/23 1857 05/13/23 2100 05/14/23 0724 05/14/23 1000  BP: (!) 145/89 132/79 134/83 121/69  Pulse: (!) 102 90 77 79  Resp: 16 18 17 18   Temp: 98.5 F (36.9 C) 98.9 F (37.2 C) 98.1 F (36.7 C) 97.9 F (36.6 C)  TempSrc:  Oral Oral Oral  SpO2: 100% 98% 99% 96%  Weight:      Height:        Exam: Gen alert , no distress No jvd or bruits Chest clear bilat to bases RRR no RG Abd soft ntnd no mass or ascites +bs GU normal male Ext no LE edema Neuro is alert, nonfocal, Ox 1    RUA AVF+bruit    Renal-related home meds: - norvasc 5- 10 mg  - auryxia 2 ac tid - coreg 6.25 bid - lisinopril 5- 20 mg every day - lyrica 25 hs - lokelma 10gm wed and sundays      OP HD: TTS G-O  4h   400/800   87.5kg  400/800   RUA AVF  Heparin 4000 - last OP HD 11/07, post wt 87kg - get's to his dry wt and about 0.5kg under most sessions last 3 wks - rocaltrol 0.75 mcg - mircera 30 mcg IV q 4, last 10/31, next 11/28       Assessment/ Plan: Acute hypoxic resp failure - w/ missed HD and pulm edema by CXR on admit. Is not on O2 at home. Has HD x 2 w/ 3.5 L and 3L off for total 6.5 L UF.  Was 4L [place] on admit, is off O2 now on room air. Vol overload resolved. Lower dry wt upon dc.  AMS - remains disoriented but pleasant and interactive.  ESKD - on HD TTS. Last outpt HD 11/07. Had HD here x 3. Next HD 11/19. HTN - poorly controlled upon arrival. BP's under control now.  Volume - > 10 under dry wt, euvolemic on exam now. UF 2 L next HD.  Anemia of eskd - Hb 11, next esa due on 11/28. Follow.  MBD ckd - CCa and phos are in range. Cont binders and po vdra.  Dispo -  not sure yet, SNF recommended by PT           Vinson Moselle MD  [facility] 05/14/2023, 10:39 AM  Recent Labs  Lab 05/09/23 1108 05/09/23 1429 05/10/23 0457 05/12/23 0550  HGB 11.3*  --  11.1* 12.5*  ALBUMIN 3.4*  --   --   --    CALCIUM 9.5  --  9.3 9.7  PHOS  --  3.9  --   --   CREATININE 16.11*  --  10.97* 10.81*  K 5.1  --  4.6 4.5   No results for input(s): "IRON", "TIBC", "FERRITIN" in the last 168 hours. Inpatient medications:  amLODipine  10 mg Oral QHS   calcitRIOL  0.75 mcg Oral Q T,Th,Sat-1800   carvedilol  6.25 mg Oral BID   Chlorhexidine Gluconate Cloth  6 each Topical Q0600   Chlorhexidine Gluconate Cloth  6 each Topical Q0600   Chlorhexidine Gluconate Cloth  6 each Topical Q0600   citalopram  20 mg Oral QHS   ezetimibe  10 mg Oral Daily   feeding supplement (NEPRO CARB STEADY)  237 mL Oral BID BM   ferric citrate  420 mg Oral TID WC   folic acid  1  mg Oral Daily   heparin  5,000 Units Subcutaneous Q8H   insulin aspart  0-5 Units Subcutaneous QHS   insulin aspart  0-9 Units Subcutaneous TID WC   levETIRAcetam  500 mg Oral BID   lisinopril  10 mg Oral Daily   melatonin  3 mg Oral QHS   multivitamin with minerals  1 tablet Oral Daily   nicotine  21 mg Transdermal Daily   QUEtiapine  25 mg Oral QHS   rosuvastatin  10 mg Oral Daily   thiamine  100 mg Oral Daily   Or   thiamine  100 mg Intravenous Daily    albuterol, hydrALAZINE, LORazepam, oxyCODONE, polyethylene glycol, prochlorperazine

## 2023-05-14 NOTE — Plan of Care (Signed)

## 2023-05-14 NOTE — Progress Notes (Signed)
Mobility Specialist Progress Note:    05/14/23 0901  Mobility  Activity Ambulated with assistance in hallway  Level of Assistance Contact guard assist, steadying assist  Assistive Device Front wheel walker  Distance Ambulated (ft) 200 ft  Activity Response Tolerated well  Mobility Referral Yes  $Mobility charge 1 Mobility  Mobility Specialist Start Time (ACUTE ONLY) 0901  Mobility Specialist Stop Time (ACUTE ONLY) 0915  Mobility Specialist Time Calculation (min) (ACUTE ONLY) 14 min   Pt received in bed, brother at bedside. Agreeable to mobility session. Required CGA with gait belt for safety with RW. Posterior lean while ambulating, required minimal cues to shift weight forward. Responded well. Major improvements since yesterday, able to keep RW centered during today's session. Tolerated well, asx throughout. Returned pt to room, sitting EOB for breakfast, all needs met.   Feliciana Rossetti Mobility Specialist Please contact via Special educational needs teacher or  Rehab office at [phone]

## 2023-05-15 DIAGNOSIS — G9341 Metabolic encephalopathy: Secondary | ICD-10-CM | POA: Diagnosis not present

## 2023-05-15 LAB — GLUCOSE, CAPILLARY
Glucose-Capillary: 146 mg/dL — ABNORMAL HIGH (ref 70–99)
Glucose-Capillary: 165 mg/dL — ABNORMAL HIGH (ref 70–99)
Glucose-Capillary: 158 mg/dL — ABNORMAL HIGH (ref 70–99)
Glucose-Capillary: 154 mg/dL — ABNORMAL HIGH (ref 70–99)

## 2023-05-15 MED ORDER — CHLORHEXIDINE GLUCONATE CLOTH 2 % EX PADS
6.0000 | MEDICATED_PAD | Freq: Every day | CUTANEOUS | Status: DC
  Administered 2023-05-16: 6 via TOPICAL

## 2023-05-15 MED ORDER — LISINOPRIL 10 MG PO TABS
5.0000 mg | ORAL_TABLET | Freq: Every day | ORAL | Status: DC
  Filled 2023-05-15: qty 1

## 2023-05-15 MED ORDER — AMLODIPINE BESYLATE 5 MG PO TABS
5.0000 mg | ORAL_TABLET | Freq: Every day | ORAL | Status: DC
  Administered 2023-05-15: 5 mg via ORAL
  Filled 2023-05-15: qty 1

## 2023-05-15 NOTE — Progress Notes (Signed)
[facility] Progress Note  Subjective: seen in room. No c/o's today. Wife at bedside.   Vitals:   05/15/23 0334 05/15/23 0543 05/15/23 0728 05/15/23 0959  BP: 111/68  (!) 101/52 112/70  Pulse: 72  69   Resp: 18  17   Temp: 98.3 F (36.8 C)  97.9 F (36.6 C)   TempSrc:   Oral   SpO2: 97%  99%   Weight:  75.1 kg    Height:        Exam: Gen alert , no distress No jvd or bruits Chest clear bilat to bases RRR no RG Abd soft ntnd no mass or ascites +bs GU normal male Ext no LE edema Neuro is alert, nonfocal, Ox 1    RUA AVF+bruit    Renal-related home meds: - norvasc 5- 10 mg  - auryxia 2 ac tid - coreg 6.25 bid - lisinopril 5- 20 mg every day - lyrica 25 hs - lokelma 10gm wed and sundays      OP HD: TTS G-O  4h   400/800   87.5kg  400/800   RUA AVF  Heparin 4000 - last OP HD 11/07, post wt 87kg - get's to his dry wt and about 0.5kg under most sessions last 3 wks - rocaltrol 0.75 mcg - mircera 30 mcg IV q 4, last 10/31, next 11/28       Assessment/ Plan: Acute hypoxic resp failure - w/ missed HD and pulm edema by CXR on admit. Is not on O2 at home.  Is down 7kg from admission, and 12 kg under prior dry wt. Was 4L [place] on admit, is off O2 now on room air. Vol overload resolved. Lower dry wt upon dc.  AMS - remains disoriented but pleasant and interactive.  ESKD - on HD TTS. Last outpt HD 11/07. Had HD here x 3. Next HD Tuesday. HTN - poorly controlled upon arrival, but w/ volume down now BP's are becoming soft. Will lower norvasc and acei by 50%. Keep SBP's > 110-115.  Volume - > 10 under dry wt, euvolemic on exam now. UF 1-2 L next HD.  Anemia of eskd - Hb 11, next esa due on 11/28. Follow.  MBD ckd - CCa and phos are in range. Cont binders and po vdra.  Dispo -  SNF placement in progress           Vinson Moselle MD  [facility] 05/15/2023, 12:28 PM  Recent Labs  Lab 05/09/23 1108 05/09/23 1429 05/10/23 0457 05/12/23 0550  HGB 11.3*  --  11.1* 12.5*   ALBUMIN 3.4*  --   --   --   CALCIUM 9.5  --  9.3 9.7  PHOS  --  3.9  --   --   CREATININE 16.11*  --  10.97* 10.81*  K 5.1  --  4.6 4.5   No results for input(s): "IRON", "TIBC", "FERRITIN" in the last 168 hours. Inpatient medications:  amLODipine  10 mg Oral QHS   calcitRIOL  0.75 mcg Oral Q T,Th,Sat-1800   carvedilol  6.25 mg Oral BID   Chlorhexidine Gluconate Cloth  6 each Topical Q0600   Chlorhexidine Gluconate Cloth  6 each Topical Q0600   Chlorhexidine Gluconate Cloth  6 each Topical Q0600   citalopram  20 mg Oral QHS   ezetimibe  10 mg Oral Daily   feeding supplement (NEPRO CARB STEADY)  237 mL Oral BID BM   ferric citrate  420 mg Oral TID WC  folic acid  1 mg Oral Daily   heparin  5,000 Units Subcutaneous Q8H   insulin aspart  0-5 Units Subcutaneous QHS   insulin aspart  0-9 Units Subcutaneous TID WC   levETIRAcetam  500 mg Oral BID   lisinopril  10 mg Oral Daily   melatonin  3 mg Oral QHS   multivitamin with minerals  1 tablet Oral Daily   nicotine  21 mg Transdermal Daily   QUEtiapine  25 mg Oral QHS   rosuvastatin  10 mg Oral Daily   thiamine  100 mg Oral Daily   Or   thiamine  100 mg Intravenous Daily    albuterol, hydrALAZINE, oxyCODONE, polyethylene glycol, prochlorperazine

## 2023-05-15 NOTE — Progress Notes (Addendum)
[facility] PROGRESS NOTE    Progress Note  [name] Plan Sr.  ZOX:[id number] DOB: 04/29/1961 DOA: 05/09/2023 PCP: Hoy Register, MD     Brief Narrative:   [name] Schack Sr. is an 62 y.o. male past medical history of end-stage renal disease on hemodialysis Tuesday Thursday Saturdays, diabetes mellitus type 2 hyperlipidemia, history of alcohol abuse, peripheral neuropathy comes in for acute metabolic encephalopathy that has gotten worse over the last 3 weeks patient missed his last few days of his dialysis.  The patient also experienced couple of falls at home.  He lives with his mom his mom relates he is a daily cannabis user.   Assessment/Plan:   Acute respiratory failure with hypoxia/in the setting of end-stage renal disease: Was desatting in the ED requiring oxygen. Nephrology was consulted and he was dialyzed. He has been weaned to room air. PT evaluated the patient, will need skilled nursing facility placement, patient is medically stable for placement.  Acute metabolic encephalopathy MRI of the brain showed no acute findings, just showed chronic small vessel disease. Status is improving since started dialysis. Seroquel and Celexa. Discontinue Ativan.  Uncontrolled hypertension: Currently on Norvasc, Coreg and lisinopril, blood pressure is controlled.  Newly diagnosed mildly systolic heart failure: 2D echo on admission showed an EF of 40% grade 2 diastolic dysfunction. Question due to noncompliance with his medication. Continue beta-blocker and ACE inhibitor.  Hypovolemic hyponatremia: Noted.   DVT prophylaxis: lovenox Family Communication: Mother Status is: Inpatient Remains inpatient appropriate because: Pulmonary edema.    Code Status:     Code Status Orders  (From admission, onward)           Start     Ordered   05/09/23 1431  Full code  Continuous       Question:  By:  Answer:  Consent: discussion documented in EHR   05/09/23 1430            Code Status History     Date Active Date Inactive Code Status Order ID Comments User Context   06/12/2018 2003 06/14/2018 1915 Full Code [id number]  [name] Deutscher, MD ED   09/14/2017 2035 09/15/2017 1615 Full Code [id number]  [name] Citrin, MD Inpatient         IV Access:   Peripheral IV   Procedures and diagnostic studies:   No results found.   Medical Consultants:   None.   Subjective:    [name] Plan Sr. breathing at baseline.  Objective:    Vitals:   05/14/23 1942 05/15/23 0334 05/15/23 0543 05/15/23 0728  BP: 132/81 111/68  (!) 101/52  Pulse: 80 72  69  Resp: 18 18  17   Temp: 99.1 F (37.3 C) 98.3 F (36.8 C)  97.9 F (36.6 C)  TempSrc: Oral   Oral  SpO2: 98% 97%  99%  Weight:   75.1 kg   Height:       SpO2: 99 % O2 Flow Rate (L/min): 2 L/min   Intake/Output Summary (Last 24 hours) at 05/15/2023 0959 Last data filed at 05/14/2023 1435 Gross per 24 hour  Intake 480 ml  Output --  Net 480 ml   Filed Weights   05/13/23 1444 05/13/23 1855 05/15/23 0543  Weight: 75.5 kg (S) 73.4 kg 75.1 kg    Exam: General exam: In no acute distress. Respiratory system: Good air movement and clear to auscultation. Cardiovascular system: S1 & S2 heard, RRR. No JVD. Gastrointestinal system: Abdomen is nondistended, soft and nontender.  Extremities: No  pedal edema. Skin: No rashes, lesions or ulcers Psychiatry: Judgement and insight appear normal. Mood & affect appropriate.    Data Reviewed:    Labs: Basic Metabolic Panel: Recent Labs  Lab 05/09/23 1108 05/09/23 1429 05/10/23 0457 05/12/23 0550  NA 133*  --  136 135  K 5.1  --  4.6 4.5  CL 90*  --  93* 92*  CO2 24  --  27 25  GLUCOSE 127*  --  111* 107*  BUN 51*  --  28* 32*  CREATININE 16.11*  --  10.97* 10.81*  CALCIUM 9.5  --  9.3 9.7  PHOS  --  3.9  --   --    GFR Estimated Creatinine Clearance: 7.3 mL/min (A) (by C-G formula based on SCr of 10.81 mg/dL (H)). Liver Function  Tests: Recent Labs  Lab 05/09/23 1108  AST 23  ALT 12  ALKPHOS 58  BILITOT 0.8  PROT 6.2*  ALBUMIN 3.4*   Recent Labs  Lab 05/09/23 1108  LIPASE 73*   Recent Labs  Lab 05/09/23 1126  AMMONIA 24   Coagulation profile No results for input(s): "INR", "PROTIME" in the last 168 hours. COVID-19 Labs  No results for input(s): "DDIMER", "FERRITIN", "LDH", "CRP" in the last 72 hours.  No results found for: "SARSCOV2NAA"  CBC: Recent Labs  Lab 05/09/23 1108 05/10/23 0457 05/12/23 0550  WBC 11.1* 11.4* 9.4  NEUTROABS 8.6*  --  5.9  HGB 11.3* 11.1* 12.5*  HCT 33.3* 33.1* 37.5*  MCV 91.0 88.3 90.1  PLT 248 256 299   Cardiac Enzymes: No results for input(s): "CKTOTAL", "CKMB", "CKMBINDEX", "TROPONINI" in the last 168 hours. BNP (last 3 results) No results for input(s): "PROBNP" in the last 8760 hours. CBG: Recent Labs  Lab 05/14/23 0759 05/14/23 1212 05/14/23 1736 05/14/23 1941 05/15/23 0727  GLUCAP 132* 168* 198* 156* 146*   D-Dimer: No results for input(s): "DDIMER" in the last 72 hours. Hgb A1c: No results for input(s): "HGBA1C" in the last 72 hours. Lipid Profile: No results for input(s): "CHOL", "HDL", "LDLCALC", "TRIG", "CHOLHDL", "LDLDIRECT" in the last 72 hours. Thyroid function studies: No results for input(s): "TSH", "T4TOTAL", "T3FREE", "THYROIDAB" in the last 72 hours.  Invalid input(s): "FREET3" Anemia work up: No results for input(s): "VITAMINB12", "FOLATE", "FERRITIN", "TIBC", "IRON", "RETICCTPCT" in the last 72 hours. Sepsis Labs: Recent Labs  Lab 05/09/23 1108 05/10/23 0457 05/12/23 0550  WBC 11.1* 11.4* 9.4   Microbiology No results found for this or any previous visit (from the past 240 hour(s)).   Medications:    amLODipine  10 mg Oral QHS   calcitRIOL  0.75 mcg Oral Q T,Th,Sat-1800   carvedilol  6.25 mg Oral BID   Chlorhexidine Gluconate Cloth  6 each Topical Q0600   Chlorhexidine Gluconate Cloth  6 each Topical Q0600    Chlorhexidine Gluconate Cloth  6 each Topical Q0600   citalopram  20 mg Oral QHS   ezetimibe  10 mg Oral Daily   feeding supplement (NEPRO CARB STEADY)  237 mL Oral BID BM   ferric citrate  420 mg Oral TID WC   folic acid  1 mg Oral Daily   heparin  5,000 Units Subcutaneous Q8H   insulin aspart  0-5 Units Subcutaneous QHS   insulin aspart  0-9 Units Subcutaneous TID WC   levETIRAcetam  500 mg Oral BID   lisinopril  10 mg Oral Daily   melatonin  3 mg Oral QHS   multivitamin with minerals  1 tablet Oral Daily   nicotine  21 mg Transdermal Daily   QUEtiapine  25 mg Oral QHS   rosuvastatin  10 mg Oral Daily   thiamine  100 mg Oral Daily   Or   thiamine  100 mg Intravenous Daily   Continuous Infusions:    LOS: 5 days   Marinda Elk  [facility]  05/15/2023, 9:59 AM

## 2023-05-15 NOTE — Progress Notes (Signed)
Physical Therapy Treatment Patient Details Name: [name] Kirtz Sr. MRN: [id number] DOB: 1960-11-30 Today's Date: 05/15/2023   History of Present Illness Pt is a 62 year old man admitted on 11/12 with AMS. Pt missed two HD sessions. Daughter reports multiple falls in the week leading up to admission. PMH: ESRD, CAD, CHF, DM, seizures, peripheral neuropathy, anemia, HLD, COPD.    PT Comments  Making gradual progression towards functional goals which have been updated appropriately. Pt requires min assist to ambulate, demonstrating impaired balance, poor RW control, and requires cues for simple directions. DGI of 5 indicating high fall risk. Reviewed LE exercises. Disoriented to year and situation but recalls month and that he is in the hospital, although unsure of name of hospital. Patient will benefit from continued inpatient follow up therapy, <3 hours/day. Patient will continue to benefit from skilled physical therapy services to further improve independence with functional mobility.     If plan is discharge home, recommend the following: Assistance with cooking/housework;Direct supervision/assist for medications management;Direct supervision/assist for financial management;Assist for transportation;Supervision due to cognitive status;A little help with walking and/or transfers;A little help with bathing/dressing/bathroom   Can travel by private vehicle     Yes  Equipment Recommendations  None recommended by PT    Recommendations for Other Services       Precautions / Restrictions Precautions Precautions: Fall Restrictions Weight Bearing Restrictions: No     Mobility  Bed Mobility Overal bed mobility: Modified Independent Bed Mobility: Supine to Sit, Sit to Supine     Supine to sit: Supervision Sit to supine: Supervision   General bed mobility comments: Supervision, no extra time. Difficulty sequencing himself to slide up in bed.    Transfers Overall transfer level: Needs  assistance Equipment used: Rolling walker (2 wheels) Transfers: Sit to/from Stand Sit to Stand: Contact guard assist           General transfer comment: CGA for safety with RW for support upon standing.    Ambulation/Gait Ambulation/Gait assistance: Min assist Gait Distance (Feet): 200 Feet Assistive device: Rolling walker (2 wheels), None Gait Pattern/deviations: Step-through pattern, Decreased stride length, Ataxic, Drifts right/left, Leaning posteriorly, Scissoring, Staggering right, Staggering left Gait velocity: dec Gait velocity interpretation: <1.8 ft/sec, indicate of risk for recurrent falls   General Gait Details: Intermittent LOB requiring up to min assist to correct, performed half distance with RW, showing poor control of device, tipping it side to side, with cues for technique and awareness. More stable than previous visit however. Without AD pt required min assist intermittently for impaired balance, showing incresed sway, scissoring and staggering Lt and Rt. Educated on safety and awareness.   Stairs             Wheelchair Mobility     Tilt Bed    Modified Rankin (Stroke Patients Only)       Balance Overall balance assessment: Needs assistance Sitting-balance support: Feet unsupported, No upper extremity supported Sitting balance-Leahy Scale: Fair     Standing balance support: No upper extremity supported, During functional activity Standing balance-Leahy Scale: Fair                   Standardized Balance Assessment Standardized Balance Assessment : Dynamic Gait Index   Dynamic Gait Index Level Surface: Moderate Impairment Change in Gait Speed: Moderate Impairment Gait with Horizontal Head Turns: Severe Impairment Gait with Vertical Head Turns: Moderate Impairment Gait and Pivot Turn: Moderate Impairment Step Over Obstacle: Severe Impairment Step Around Obstacles: Severe Impairment Steps:  Moderate Impairment Total Score: 5       Cognition Arousal: Alert Behavior During Therapy: Impulsive Overall Cognitive Status: Impaired/Different from baseline Area of Impairment: Memory, Safety/judgement, Following commands, Orientation, Problem solving                 Orientation Level: Disoriented to, Time, Situation (One year off from acutal year; aware of month.)   Memory: Decreased short-term memory, Decreased recall of precautions Following Commands: Follows multi-step commands inconsistently, Follows multi-step commands with increased time Safety/Judgement: Decreased awareness of safety, Decreased awareness of deficits   Problem Solving: Requires verbal cues          Exercises General Exercises - Lower Extremity Ankle Circles/Pumps: AROM, Both, 10 reps, Seated Quad Sets: Strengthening, 5 reps, 15 reps, Supine Gluteal Sets: Strengthening, Both, 10 reps, Supine Long Arc Quad: Strengthening, Both, 10 reps, Seated Hip ABduction/ADduction: Strengthening, Both, 10 reps, Seated Hip Flexion/Marching: Strengthening, Both, 5 reps, Seated    General Comments        Pertinent Vitals/Pain Pain Assessment Pain Assessment: No/denies pain Pain Intervention(s): Monitored during session    Home Living                          Prior Function            PT Goals (current goals can now be found in the care plan section) Acute Rehab PT Goals Patient Stated Goal: none stated PT Goal Formulation: With family Time For Goal Achievement: 05/25/23 Potential to Achieve Goals: Good Progress towards PT goals: Progressing toward goals    Frequency    Min 1X/week      PT Plan      Co-evaluation              AM-PAC PT "6 Clicks" Mobility   Outcome Measure  Help needed turning from your back to your side while in a flat bed without using bedrails?: A Little Help needed moving from lying on your back to sitting on the side of a flat bed without using bedrails?: A Little Help needed moving to  and from a bed to a chair (including a wheelchair)?: A Little Help needed standing up from a chair using your arms (e.g., wheelchair or bedside chair)?: A Little Help needed to walk in hospital room?: A Little Help needed climbing 3-5 steps with a railing? : A Lot 6 Click Score: 17    End of Session Equipment Utilized During Treatment: Gait belt Activity Tolerance: Patient tolerated treatment well Patient left: in bed;with call bell/phone within reach;with bed alarm set;with SCD's reapplied   PT Visit Diagnosis: Unsteadiness on feet (R26.81);Repeated falls (R29.6);Muscle weakness (generalized) (M62.81);History of falling (Z91.81);Difficulty in walking, not elsewhere classified (R26.2);Other symptoms and signs involving the nervous system (R29.898)     Time: 1431-1441 PT Time Calculation (min) (ACUTE ONLY): 10 min  Charges:    $Gait Training: 8-22 mins PT General Charges $$ ACUTE PT VISIT: 1 Visit                     Kathlyn Sacramento, PT, DPT [facility]  Rehabilitation Services Physical Therapist Office: [phone] Website: [email or web address]    Berton Mount 05/15/2023, 3:19 PM

## 2023-05-15 NOTE — Care Management Important Message (Signed)
Important Message  Patient Details  Name: [name] Ord Sr. MRN: [id number] Date of Birth: 1960-12-06   Important Message Given:  Yes - Medicare IM     Dorena Bodo 05/15/2023, 3:15 PM

## 2023-05-15 NOTE — Progress Notes (Signed)
Occupational Therapy Treatment Patient Details Name: [name] Rin Sr. MRN: [id number] DOB: [birth year] Today's Date: 05/15/2023   History of present illness Pt is a 62 year old man admitted on 11/12 with AMS. Pt missed two HD sessions. Daughter reports multiple falls in the week leading up to admission. PMH: ESRD, CAD, CHF, DM, seizures, peripheral neuropathy, anemia, HLD, COPD.   OT comments  Pt denies vision changes, reports his acuity has been decreasing for reading in recent years. No field loss noted with functional activities at sink. Some difficulty conforming to requirements of field testing due to attention, but appears to be intact. Pt able to don socks with set up. He demonstrates a posterior bias in sitting, but no LOB. Pt stood and ambulated with RW to sink for grooming with CGA. 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 Pt recalls what he has been told about his condition when he was admitted. He is agreeable to post acute rehab prior to return home.       If plan is discharge home, recommend the following:  A little help with walking and/or transfers;A little help with bathing/dressing/bathroom;Assistance with cooking/housework;Direct supervision/assist for medications management;Direct supervision/assist for financial management;Assist for transportation;Help with stairs or ramp for entrance   Equipment Recommendations  None recommended by OT    Recommendations for Other Services      Precautions / Restrictions Precautions Precautions: Fall Restrictions Weight Bearing Restrictions: No       Mobility Bed Mobility Overal bed mobility: Modified Independent             General bed mobility comments: HOB up    Transfers Overall transfer level: Needs assistance Equipment used: Rolling walker (2 wheels) Transfers: Sit to/from Stand Sit to Stand: Contact guard assist           General transfer comment: no physical  assist, some decreased control of descent     Balance Overall balance assessment: Needs assistance   Sitting balance-Leahy Scale: Good Sitting balance - Comments: no over LOB, but demonstrates posterior bias with LB dressing     Standing balance-Leahy Scale: Poor                             ADL either performed or assessed with clinical judgement   ADL Overall ADL's : Needs assistance/impaired     Grooming: Wash/dry hands;Wash/dry face;Brushing hair;Standing;Contact guard assist               Lower Body Dressing: Set up;Sitting/lateral leans Lower Body Dressing Details (indicate cue type and reason): socks                    Extremity/Trunk Assessment              Vision   Additional Comments: intact visual fields, some difficulty conforming to requirements of assessment and filtering out room distractions   Perception     Praxis      Cognition Arousal: Alert Behavior During Therapy: Impulsive Overall Cognitive Status: Impaired/Different from baseline Area of Impairment: Memory, Safety/judgement, Following commands                   Current Attention Level: Selective Memory: Decreased short-term memory Following Commands: Follows multi-step commands inconsistently, Follows multi-step commands with increased time Safety/Judgement: Decreased awareness of safety, Decreased awareness of deficits              Exercises  Shoulder Instructions       General Comments      Pertinent Vitals/ Pain       Pain Assessment Pain Assessment: No/denies pain  Home Living                                          Prior Functioning/Environment              Frequency  Min 1X/week        Progress Toward Goals  OT Goals(current goals can now be found in the care plan section)  Progress towards OT goals: Progressing toward goals  Acute Rehab OT Goals OT Goal Formulation: With family Time For Goal  Achievement: 05/24/23 Potential to Achieve Goals: Good  Plan      Co-evaluation                 AM-PAC OT "6 Clicks" Daily Activity     Outcome Measure   Help from another person eating meals?: None Help from another person taking care of personal grooming?: A Little Help from another person toileting, which includes using toliet, bedpan, or urinal?: A Little Help from another person bathing (including washing, rinsing, drying)?: A Little Help from another person to put on and taking off regular upper body clothing?: A Little Help from another person to put on and taking off regular lower body clothing?: A Little 6 Click Score: 19    End of Session Equipment Utilized During Treatment: Gait belt;Rolling walker (2 wheels)  OT Visit Diagnosis: Unsteadiness on feet (R26.81);Other abnormalities of gait and mobility (R26.89);Muscle weakness (generalized) (M62.81);History of falling (Z91.81);Pain;Other symptoms and signs involving cognitive function   Activity Tolerance Patient tolerated treatment well   Patient Left in bed;with call bell/phone within reach;with bed alarm set;with family/visitor present   Nurse Communication          Time: 4259-5638 OT Time Calculation (min): 25 min  Charges: OT General Charges $OT Visit: 1 Visit OT Treatments $Self Care/Home Management : 23-37 mins  Berna Spare, OTR/L Acute Rehabilitation Services Office: [phone]   Evern Bio 05/15/2023, 11:46 AM

## 2023-05-15 NOTE — Plan of Care (Signed)

## 2023-05-16 DIAGNOSIS — G9340 Encephalopathy, unspecified: Secondary | ICD-10-CM

## 2023-05-16 DIAGNOSIS — E1151 Type 2 diabetes mellitus with diabetic peripheral angiopathy without gangrene: Secondary | ICD-10-CM | POA: Diagnosis not present

## 2023-05-16 DIAGNOSIS — G9341 Metabolic encephalopathy: Secondary | ICD-10-CM | POA: Diagnosis not present

## 2023-05-16 LAB — CBC WITH DIFFERENTIAL/PLATELET
Abs Immature Granulocytes: 0.05 10*3/uL (ref 0.00–0.07)
Basophils Absolute: 0.1 10*3/uL (ref 0.0–0.1)
Basophils Relative: 1 %
Eosinophils Absolute: 0.5 10*3/uL (ref 0.0–0.5)
Eosinophils Relative: 5 %
HCT: 32.9 % — ABNORMAL LOW (ref 39.0–52.0)
Hemoglobin: 11 g/dL — ABNORMAL LOW (ref 13.0–17.0)
Immature Granulocytes: 0 %
Lymphocytes Relative: 18 %
Lymphs Abs: 2.1 10*3/uL (ref 0.7–4.0)
MCH: 29.6 pg (ref 26.0–34.0)
MCHC: 33.4 g/dL (ref 30.0–36.0)
MCV: 88.4 fL (ref 80.0–100.0)
Monocytes Absolute: 1 10*3/uL (ref 0.1–1.0)
Monocytes Relative: 9 %
Neutro Abs: 7.8 10*3/uL — ABNORMAL HIGH (ref 1.7–7.7)
Neutrophils Relative %: 67 %
Platelets: 327 10*3/uL (ref 150–400)
RBC: 3.72 MIL/uL — ABNORMAL LOW (ref 4.22–5.81)
RDW: 13.9 % (ref 11.5–15.5)
WBC: 11.6 10*3/uL — ABNORMAL HIGH (ref 4.0–10.5)
nRBC: 0 % (ref 0.0–0.2)

## 2023-05-16 LAB — BASIC METABOLIC PANEL
Anion gap: 18 — ABNORMAL HIGH (ref 5–15)
BUN: 74 mg/dL — ABNORMAL HIGH (ref 8–23)
CO2: 30 mmol/L (ref 22–32)
Calcium: 9.8 mg/dL (ref 8.9–10.3)
Chloride: 85 mmol/L — ABNORMAL LOW (ref 98–111)
Creatinine, Ser: 17.28 mg/dL — ABNORMAL HIGH (ref 0.61–1.24)
GFR, Estimated: 3 mL/min — ABNORMAL LOW (ref >60)
Glucose, Bld: 171 mg/dL — ABNORMAL HIGH (ref 70–99)
Potassium: 4.8 mmol/L (ref 3.5–5.1)
Sodium: 133 mmol/L — ABNORMAL LOW (ref 135–145)

## 2023-05-16 LAB — GLUCOSE, CAPILLARY
Glucose-Capillary: 139 mg/dL — ABNORMAL HIGH (ref 70–99)
Glucose-Capillary: 189 mg/dL — ABNORMAL HIGH (ref 70–99)

## 2023-05-16 MED ORDER — LISINOPRIL 5 MG PO TABS: 5.0000 mg | ORAL_TABLET | Freq: Every day | ORAL | Status: DC

## 2023-05-16 MED ORDER — NICOTINE 21 MG/24HR TD PT24: 21.0000 mg | MEDICATED_PATCH | Freq: Every day | TRANSDERMAL | 0 refills | Status: DC

## 2023-05-16 MED ORDER — AMLODIPINE BESYLATE 5 MG PO TABS: 5.0000 mg | ORAL_TABLET | Freq: Every day | ORAL | Status: DC

## 2023-05-16 MED ORDER — QUETIAPINE FUMARATE 25 MG PO TABS: 25.0000 mg | ORAL_TABLET | Freq: Every day | ORAL | Status: DC

## 2023-05-16 MED ORDER — HEPARIN SODIUM (PORCINE) 1000 UNIT/ML IJ SOLN
INTRAMUSCULAR | Status: AC
  Administered 2023-05-16: 3000 [IU]
  Filled 2023-05-16: qty 3

## 2023-05-16 NOTE — Progress Notes (Signed)
SLP Cancellation Note  Patient Details Name: [name] Edwardson Sr. MRN: [id number] DOB: 1960/11/02   Cancelled treatment:       Reason Eval/Treat Not Completed: Patient at procedure or test/unavailable (HD). Will continue following.    Gwynneth Aliment, M.A., CF-SLP Speech Language Pathology, Acute Rehabilitation Services  Secure Chat preferred [phone]  05/16/2023, 3:25 PM

## 2023-05-16 NOTE — TOC Transition Note (Signed)
Transition of Care [facility]) - CM/SW Discharge Note   Patient Details  Name: [name] Partin Sr. MRN: [id number] Date of Birth: [birth year]  [facility] of [facility] CM/SW Contact:  Leitha Hyppolite A [place], LCSWA Phone Number: 05/16/2023, 3:07 PM   Clinical Narrative:     Patient will DC to: [facility]  Anticipated DC date: 05/16/23  Family notified: Herby Abraham  Transport by: Sharin Mons      Per MD patient ready for DC to [facility] . RN, patient, patient's family, and facility notified of DC. Discharge Summary and FL2 sent to facility. RN to call report prior to discharge ( Room 218, [phone]). DC packet on chart. Ambulance transport requested for patient.     CSW will sign off for now as social work intervention is no longer needed. Please consult [place] again if new needs arise.   Final next level of care: Skilled Nursing Facility Barriers to Discharge: Barriers Resolved   Patient Goals and CMS Choice      Discharge Placement                Patient chooses bed at: [facility] Patient to be transferred to facility by: [facility] Name of family member notified: Amy Patient and family notified of of transfer: 05/16/23  Discharge Plan and Services Additional resources added to the After Visit Summary for                    DME Agency: NA       HH Arranged: NA          Social Determinants of Health (SDOH) Interventions SDOH Screenings   Food Insecurity: No Food Insecurity (05/09/2023)  Housing: Low Risk  (05/09/2023)  Transportation Needs: No Transportation Needs (05/09/2023)  Utilities: Not At Risk (05/09/2023)  Alcohol Screen: Low Risk  (09/03/2021)  Depression (PHQ2-9): Low Risk  (09/12/2022)  Financial Resource Strain: Low Risk  (09/12/2022)  Physical Activity: Inactive (09/12/2022)  Social Connections: Moderately Isolated (09/03/2021)  Stress: No Stress Concern Present (09/12/2022)  Tobacco Use: High Risk (05/09/2023)     Readmission Risk  Interventions     No data to display

## 2023-05-16 NOTE — Progress Notes (Signed)
IV taken out per PCT w/cath intact.  Pt Dc'd via stretcher with [facility] to go to SNF.

## 2023-05-16 NOTE — Progress Notes (Signed)
Per MD [place]her[place] request that we discontinued the patient HD tx, due to we had to bring a Emergency  patient to HD.

## 2023-05-16 NOTE — Progress Notes (Addendum)
Received patient in bed to unit.  Alert and oriented.  Informed consent signed and in chart.   TX duration: 1hr 18 mins  Patient tolerated well.  Transported back to the room  Alert, without acute distress.  Hand-off given to patient's nurse.   Access used: Yes Access issues: No  Total UF removed: 600 Medication(s) given: See MAR Post HD VS: See Above Grid Post HD weight: 80.1 kg   [facility]  Alert and oriented.  Informed consent signed and in chart.   TX duration:1: 18 min  Patient tolerated well.  Transported back to the room  Alert, without acute distress.  Hand-off given to patient's nurse.   Access used: Yes Access issues: No  Total UF removed: 600 Medication(s) given: See MAR Post HD VS: See Above Grid Post HD weight: 80.1 kg   [facility]  05/16/23 1642  Vitals  Temp 98.7 F (37.1 C)  Resp 12  BP 100/68  SpO2 100 %  During Treatment Monitoring  Blood Flow Rate (mL/min) 400 mL/min  Arterial Pressure (mmHg) -197.36 mmHg  Venous Pressure (mmHg) 225.44 mmHg  TMP (mmHg) 3.84 mmHg  Ultrafiltration Rate (mL/min) 829 mL/min  Dialysate Flow Rate (mL/min) 299 ml/min  Dialysate Potassium Concentration 3  Dialysate Calcium Concentration 2.5  Duration of HD Treatment -hour(s) 1.3 hour(s)  Cumulative Fluid Removed (mL) per Treatment  580.09  HD Safety Checks Performed Yes  Intra-Hemodialysis Comments Tx completed (Dr Arlean Hopping asked pt to end tx early for an emergent pt. pt agreed)

## 2023-05-16 NOTE — Discharge Summary (Signed)
Physician Discharge Summary  [name] [name]. ZOX:[id number] DOB: [birth year] DOA: 05/09/2023  PCP: Hoy Register, MD  Admit date: 05/09/2023 Discharge date: 05/16/2023  Admitted From: Home Disposition:  SNF  Recommendations for Outpatient Follow-up:  Follow up with PCP in 1-2 weeks titrate antihypertensive medications as needed. Please obtain BMP/CBC in one week Home Health:No Equipment/Devices:None  Discharge Condition:Stable CODE STATUS:Full Diet recommendation: Heart Healthy  Brief/Interim Summary: 62 y.o. male past medical history of end-stage renal disease on hemodialysis Tuesday Thursday Saturdays, diabetes mellitus type 2 hyperlipidemia, history of alcohol abuse, peripheral neuropathy comes in for acute metabolic encephalopathy that has gotten worse over the last 3 weeks patient missed his last few days of his dialysis.  The patient also experienced couple of falls at home.  He lives with his mom his mom relates he is a daily cannabis user.   Discharge Diagnoses:  Principal Problem:   Acute metabolic encephalopathy Active Problems:   Type 2 diabetes mellitus (HCC)   Type 2 diabetes mellitus with diabetic peripheral angiopathy without gangrene (HCC)   CKD (chronic kidney disease) stage 5, GFR less than 15 ml/min (HCC)   ESRD (end stage renal disease) (HCC)  Acute respiratory failure with hypoxia in the setting of end-stage renal disease/pulmonary edema: Was the satting in the ED require oxygen nephrology was consulted he was dialyzed we were able to wean him to room air. Physical therapy evaluated the patient, will go to skilled nursing facility.  Acute metabolic encephalopathy: Imaging was unremarkable and is likely due to hypoxia. His mentation improved and he started getting dialysis. No changes made to his medication he will need to be evaluated as an outpatient for dementia.  Uncontrolled hypertension: Continue Norvasc Coreg and lisinopril blood pressure is  well-controlled.  Newly diagnosed acute systolic heart failure: With a 2D echo of 60% with grade 2 diastolic dysfunction. Likely due to noncompliance with his medication. Continue beta-blocker and ACE inhibitor titrate as an outpatient as tolerated.  Hypervolemic hyponatremia: Resolved with hemodialysis.  Discharge Instructions  Discharge Instructions     Diet - low sodium heart healthy   Complete by: As directed    Increase activity slowly   Complete by: As directed       Allergies as of 05/16/2023   No Known Allergies      Medication List     TAKE these medications    Accu-Chek Guide test strip Generic drug: glucose blood Use as instructed 3 times daily. Dx E11.22   Accu-Chek Guide w/Device Kit Use as directed 3 times daily.   Accu-Chek Softclix Lancets lancets Use as instructed tid before meals. Dx E11.22   amLODipine 5 MG tablet Commonly known as: NORVASC Take 1 tablet (5 mg total) by mouth at bedtime. What changed:  when to take this Another medication with the same name was removed. Continue taking this medication, and follow the directions you see here.   Auryxia 1 GM 210 MG(Fe) tablet Generic drug: ferric citrate Take 420 mg by mouth 2 (two) times daily before a meal.   carvedilol 6.25 MG tablet Commonly known as: COREG Take 1 tablet by mouth twice a day   citalopram 40 MG tablet Commonly known as: CELEXA Take 1 tablet (40 mg total) by mouth daily.Must have office visit for refills   ezetimibe 10 MG tablet Commonly known as: ZETIA Take 1 tablet (10 mg total) by mouth daily.   hydrOXYzine 25 MG tablet Commonly known as: ATARAX Take 1 tablet (25 mg total) by mouth 3 (  three) times daily as needed for anxiety.Must have office visit for refills.   levETIRAcetam 500 MG tablet Commonly known as: KEPPRA Take 1 tablet (500 mg total) by mouth 2 (two) times daily.Must have office visit for refills.   lisinopril 5 MG tablet Commonly known as:  ZESTRIL Take 1 tablet (5 mg total) by mouth daily. Start taking on: May 17, 2023 What changed:  medication strength how much to take Another medication with the same name was removed. Continue taking this medication, and follow the directions you see here.   Lokelma 10 g Pack packet Generic drug: sodium zirconium cyclosilicate Take 10 gram by mouth twice a week as directed on non-dialysis days (Wed and Sunday)   nicotine 14 mg/24hr patch Commonly known as: Nicoderm CQ Place 1 patch (14 mg total) onto the skin daily. What changed: Another medication with the same name was added. Make sure you understand how and when to take each.   nicotine 21 mg/24hr patch Commonly known as: NICODERM CQ - dosed in mg/24 hours Place 1 patch (21 mg total) onto the skin daily. What changed: You were already taking a medication with the same name, and this prescription was added. Make sure you understand how and when to take each.   pregabalin 25 MG capsule Commonly known as: LYRICA Take 1 capsule (25 mg total) by mouth at bedtime.   QUEtiapine 25 MG tablet Commonly known as: SEROQUEL Take 1 tablet (25 mg total) by mouth at bedtime.   rosuvastatin 10 MG tablet Commonly known as: CRESTOR Take 1 tablet (10 mg total) by mouth daily.   Ventolin HFA 108 (90 Base) MCG/ACT inhaler Generic drug: albuterol INHALE 2 PUFFS INTO THE LUNGS EVERY 6 (SIX) HOURS AS NEEDED FOR WHEEZING OR SHORTNESS OF BREATH.        No Known Allergies  Consultations: None   Procedures/Studies: ECHOCARDIOGRAM COMPLETE  Result Date: 05/10/2023    ECHOCARDIOGRAM REPORT   Patient Name:   [name] [name]. Date of Exam: 05/10/2023 Medical Rec #:  [id number]         Height:       74.0 in Accession #:    [id number]        Weight:       182.5 lb Date of Birth:  01/02/1961         BSA:          2.091 m Patient Age:    62 years          BP:           157/89 mmHg Patient Gender: M                 HR:           86  bpm. Exam  Location:  Inpatient Procedure: 2D Echo, 3D Echo, Cardiac Doppler, Color Doppler and Strain Analysis Indications:    Abnormal ECG  History:        Patient has prior history of Echocardiogram examinations, most                 recent 10/24/2017. COPD; Risk Factors:Diabetes and Hypertension.  Sonographer:    Karma Ganja Referring Phys: [id number] [facility] DA[facility]  Sonographer Comments: Global longitudinal strain was attempted. IMPRESSIONS  1. Left ventricular ejection fraction, by estimation, is 40 to 45%. Left ventricular ejection fraction by 2D MOD biplane is 40.8 %. The left ventricle has mildly decreased function. The left ventricle demonstrates global hypokinesis. The left ventricular internal cavity size  was mildly dilated. Left ventricular diastolic parameters are consistent with Grade II diastolic dysfunction (pseudonormalization). The average left ventricular global longitudinal strain is -13.8 %. The global longitudinal strain is abnormal.  2. Right ventricular systolic function is normal. The right ventricular size is normal. Tricuspid regurgitation signal is inadequate for assessing PA pressure.  3. Left atrial size was mildly dilated.  4. A small pericardial effusion is present. The pericardial effusion is posterior to the left ventricle.  5. The mitral valve is grossly normal. Mild mitral valve regurgitation. No evidence of mitral stenosis.  6. The aortic valve is tricuspid. Aortic valve regurgitation is not visualized. No aortic stenosis is present.  7. The inferior vena cava is normal in size with greater than 50% respiratory variability, suggesting right atrial pressure of 3 mmHg. FINDINGS  Left Ventricle: Left ventricular ejection fraction, by estimation, is 40 to 45%. Left ventricular ejection fraction by 2D MOD biplane is 40.8 %. The left ventricle has mildly decreased function. The left ventricle demonstrates global hypokinesis. The average left ventricular global longitudinal strain is -13.8 %. The  global longitudinal strain is abnormal. The left ventricular internal cavity size was mildly dilated. There is no left ventricular hypertrophy. Left ventricular diastolic parameters are consistent with Grade II diastolic dysfunction (pseudonormalization). Right Ventricle: The right ventricular size is normal. No increase in right ventricular wall thickness. Right ventricular systolic function is normal. Tricuspid regurgitation signal is inadequate for assessing PA pressure. Left Atrium: Left atrial size was mildly dilated. Right Atrium: Right atrial size was normal in size. Pericardium: A small pericardial effusion is present. The pericardial effusion is posterior to the left ventricle. Mitral Valve: The mitral valve is grossly normal. Mild mitral valve regurgitation. No evidence of mitral valve stenosis. Tricuspid Valve: The tricuspid valve is grossly normal. Tricuspid valve regurgitation is not demonstrated. No evidence of tricuspid stenosis. Aortic Valve: The aortic valve is tricuspid. Aortic valve regurgitation is not visualized. No aortic stenosis is present. Aortic valve mean gradient measures 6.0 mmHg. Aortic valve peak gradient measures 10.8 mmHg. Aortic valve area, by VTI measures 2.56  cm. Pulmonic Valve: The pulmonic valve was grossly normal. Pulmonic valve regurgitation is not visualized. No evidence of pulmonic stenosis. Aorta: The aortic root is normal in size and structure. Venous: The right lower pulmonary vein is normal. The inferior vena cava is normal in size with greater than 50% respiratory variability, suggesting right atrial pressure of 3 mmHg. IAS/Shunts: The atrial septum is grossly normal.  LEFT VENTRICLE PLAX 2D                        Biplane EF (MOD) LVIDd:         5.80 cm         LV Biplane EF:   Left LVIDs:         4.60 cm                          ventricular LV PW:         1.10 cm                          ejection LV IVS:        1.10 cm                          fraction by LVOT diam:  1.90 cm                          2D MOD LV SV:         83                               biplane is LV SV Index:   40                               40.8 %. LVOT Area:     2.84 cm                                Diastology                                LV e' medial:    5.44 cm/s LV Volumes (MOD)               LV E/e' medial:  18.4 LV vol d, MOD    163.0 ml      LV e' lateral:   5.66 cm/s A2C:                           LV E/e' lateral: 17.7 LV vol d, MOD    145.0 ml A4C:                           2D LV vol s, MOD    97.4 ml       Longitudinal A2C:                           Strain LV vol s, MOD    83.4 ml       2D Strain GLS  -13.8 % A4C:                           Avg: LV SV MOD A2C:   65.6 ml LV SV MOD A4C:   145.0 ml LV SV MOD BP:    63.0 ml                                3D Volume EF:                                3D EF:        48 %                                LV EDV:       189 ml                                LV ESV:       98 ml  LV SV:        91 ml RIGHT VENTRICLE             IVC RV Basal diam:  4.00 cm     IVC diam: 1.60 cm RV S prime:     15.70 cm/s TAPSE (M-mode): 2.5 cm LEFT ATRIUM              Index        RIGHT ATRIUM           Index LA diam:        4.00 cm  1.91 cm/m   RA Area:     22.70 cm LA Vol (A2C):   118.0 ml 56.44 ml/m  RA Volume:   72.20 ml  34.53 ml/m LA Vol (A4C):   58.2 ml  27.84 ml/m LA Biplane Vol: 82.6 ml  39.51 ml/m  AORTIC VALVE AV Area (Vmax):    2.49 cm AV Area (Vmean):   2.35 cm AV Area (VTI):     2.56 cm AV Vmax:           164.00 cm/s AV Vmean:          114.000 cm/s AV VTI:            0.324 m AV Peak Grad:      10.8 mmHg AV Mean Grad:      6.0 mmHg LVOT Vmax:         144.00 cm/s LVOT Vmean:        94.300 cm/s LVOT VTI:          0.293 m LVOT/AV VTI ratio: 0.90  AORTA Ao Root diam: 3.00 cm MITRAL VALVE MV Area (PHT): 3.83 cm     SHUNTS MV Decel Time: 198 msec     Systemic VTI:  0.29 m MV E velocity: 100.00 cm/s  Systemic Diam: 1.90 cm MV A  velocity: 81.90 cm/s MV E/A ratio:  1.22 Lennie Odor MD Electronically signed by Lennie Odor MD Signature Date/Time: 05/10/2023/3:10:04 PM    Final    MR BRAIN WO CONTRAST  Result Date: 05/09/2023 CLINICAL DATA:  Provided history: Mental status change, persistent or worsening. Acute metabolic encephalopathy. Frequent falls. Chronic smoker. EXAM: MRI HEAD WITHOUT CONTRAST TECHNIQUE: Multiplanar, multiecho pulse sequences of the brain and surrounding structures were obtained without intravenous contrast. COMPARISON:  Prior head CT examinations 05/09/2023 and earlier. FINDINGS: Intermittently motion degraded examination, limiting evaluation. Most notably, there is severe motion degradation of the axial T2 sequence, moderate-to-severe motion degradation of the axial T2 FLAIR sequences, moderate motion degradation of the least motion degraded axial SWI sequence, moderate-to-severe motion degradation of the axial T1 sequence and moderate-to-severe motion degradation of the sagittal T1 sequences. Within this limitation, findings are as follows. Brain: Mild generalized cerebral atrophy. Prominence of the ventricles and sulci appears commensurate. 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 Multifocal T2 FLAIR hyperintense signal abnormality within the cerebral white matter, nonspecific but compatible with moderate chronic small vessel ischemic disease. Mild chronic small vessel ischemic changes within the pons. There is no acute infarct. No evidence of an intracranial mass. No extra-axial fluid collection. No midline shift. Vascular: Maintained flow voids within the proximal large arterial vessels. Skull and upper cervical spine: No focal worrisome marrow lesion. Susceptibility artifact arising from ACDF hardware. Sinuses/Orbits: No mass or acute finding within the imaged orbits. No significant paranasal sinus disease. Other: Right middle  ear/mastoid effusion. IMPRESSION: 1. Significantly motion degraded  examination, limiting evaluation. 2. No evidence of an acute intracranial abnormality. The diffusion-weighted imaging is of good quality and there is no evidence of an acute infarct. 3. 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 4. Background parenchymal atrophy and chronic small vessel disease. 5. Right middle ear/mastoid effusion. Electronically Signed   By: Jackey Loge D.O.   On: 05/09/2023 17:42   DG Chest Portable 1 View  Result Date: 05/09/2023 CLINICAL DATA:  Weakness altered mental status EXAM: PORTABLE CHEST 1 VIEW COMPARISON:  06/12/2018, chest CT 02/22/2021 FINDINGS: Borderline cardiac enlargement. Aortic atherosclerosis. Mild asymmetric ground-glass opacity in the right thorax. Mild airspace disease at the left lung base. Possible tiny effusions. IMPRESSION: Mild asymmetric ground-glass opacity in the right thorax with more focal airspace disease at left lung base, favor infection. Possible tiny effusions. Electronically Signed   By: Jasmine Pang M.D.   On: 05/09/2023 15:25   CT Head Wo Contrast  Result Date: 05/09/2023 CLINICAL DATA:  Mental status change of unknown cause. Agitation. Dialysis patient. EXAM: CT HEAD WITHOUT CONTRAST TECHNIQUE: Contiguous axial images were obtained from the base of the skull through the vertex without intravenous contrast. RADIATION DOSE REDUCTION: This exam was performed according to the departmental dose-optimization program which includes automated exposure control, adjustment of the mA and/or kV according to patient size and/or use of iterative reconstruction technique. COMPARISON:  04/09/2023 FINDINGS: Brain: No acute finding. Chronic small-vessel ischemic changes of the white matter as seen previously. No sign of acute infarction, mass lesion, hemorrhage, hydrocephalus or extra-axial collection. Vascular: There is atherosclerotic  calcification of the major vessels at the base of the brain. Skull: Negative Sinuses/Orbits: Clear/normal Other: None IMPRESSION: No acute CT finding. Chronic small-vessel ischemic changes of the white matter. Electronically Signed   By: Paulina Fusi M.D.   On: 05/09/2023 13:33   (Echo, Carotid, EGD, Colonoscopy, ERCP)    Subjective: No complaints  Discharge Exam: Vitals:   05/16/23 0817 05/16/23 1025  BP: 127/71 115/62  Pulse: 67   Resp: 14   Temp:    SpO2: 98%    Vitals:   05/15/23 2320 05/16/23 0334 05/16/23 0817 05/16/23 1025  BP: 125/73 124/77 127/71 115/62  Pulse: 78 71 67   Resp: 16 16 14    Temp: 98.4 F (36.9 C)     TempSrc:      SpO2: 95% 97% 98%   Weight:      Height:        General: Pt is alert, awake, not in acute distress Cardiovascular: RRR, S1/S2 +, no rubs, no gallops Respiratory: CTA bilaterally, no wheezing, no rhonchi Abdominal: Soft, NT, ND, bowel sounds + Extremities: no edema, no cyanosis    The results of significant diagnostics from this hospitalization (including imaging, microbiology, ancillary and laboratory) are listed below for reference.     Microbiology: No results found for this or any previous visit (from the past 240 hour(s)).   Labs: BNP (last 3 results) No results for input(s): "BNP" in the last 8760 hours. Basic Metabolic Panel: Recent Labs  Lab 05/09/23 1429 05/10/23 0457 05/12/23 0550  NA  --  136 135  K  --  4.6 4.5  CL  --  93* 92*  CO2  --  27 25  GLUCOSE  --  111* 107*  BUN  --  28* 32*  CREATININE  --  10.97* 10.81*  CALCIUM  --  9.3 9.7  PHOS 3.9  --   --  Liver Function Tests: No results for input(s): "AST", "ALT", "ALKPHOS", "BILITOT", "PROT", "ALBUMIN" in the last 168 hours. No results for input(s): "LIPASE", "AMYLASE" in the last 168 hours. No results for input(s): "AMMONIA" in the last 168 hours. CBC: Recent Labs  Lab 05/10/23 0457 05/12/23 0550  WBC 11.4* 9.4  NEUTROABS  --  5.9  HGB 11.1*  12.5*  HCT 33.1* 37.5*  MCV 88.3 90.1  PLT 256 299   Cardiac Enzymes: No results for input(s): "CKTOTAL", "CKMB", "CKMBINDEX", "TROPONINI" in the last 168 hours. BNP: Invalid input(s): "POCBNP" CBG: Recent Labs  Lab 05/15/23 1153 05/15/23 1804 05/15/23 1912 05/16/23 0758 05/16/23 1143  GLUCAP 165* 158* 154* 139* 189*   D-Dimer No results for input(s): "DDIMER" in the last 72 hours. Hgb A1c No results for input(s): "HGBA1C" in the last 72 hours. Lipid Profile No results for input(s): "CHOL", "HDL", "LDLCALC", "TRIG", "CHOLHDL", "LDLDIRECT" in the last 72 hours. Thyroid function studies No results for input(s): "TSH", "T4TOTAL", "T3FREE", "THYROIDAB" in the last 72 hours.  Invalid input(s): "FREET3" Anemia work up No results for input(s): "VITAMINB12", "FOLATE", "FERRITIN", "TIBC", "IRON", "RETICCTPCT" in the last 72 hours. Urinalysis    Component Value Date/Time   COLORURINE YELLOW 06/12/2018 1558   APPEARANCEUR CLEAR 06/12/2018 1558   LABSPEC 1.009 06/12/2018 1558   PHURINE 5.0 06/12/2018 1558   GLUCOSEU 50 (A) 06/12/2018 1558   HGBUR MODERATE (A) 06/12/2018 1558   BILIRUBINUR NEGATIVE 06/12/2018 1558   BILIRUBINUR neg 01/11/2018 1459   KETONESUR NEGATIVE 06/12/2018 1558   PROTEINUR 100 (A) 06/12/2018 1558   UROBILINOGEN 0.2 01/11/2018 1459   NITRITE NEGATIVE 06/12/2018 1558   LEUKOCYTESUR NEGATIVE 06/12/2018 1558   Sepsis Labs Recent Labs  Lab 05/10/23 0457 05/12/23 0550  WBC 11.4* 9.4   Microbiology No results found for this or any previous visit (from the past 240 hour(s)).   Time coordinating discharge: Over 30 minutes  SIGNED:   Marinda Elk, MD  [facility] 05/16/2023, 12:14 PM Pager   If 7PM-7AM, please contact night-coverage [email or web address] Password [email or web address]

## 2023-05-16 NOTE — Progress Notes (Signed)
Mobility Specialist Progress Note:   05/16/23 1031  Mobility  Activity Ambulated with assistance in hallway  Level of Assistance Minimal assist, patient does 75% or more  Assistive Device Front wheel walker  Distance Ambulated (ft) 250 ft  Activity Response Tolerated well  Mobility Referral Yes  $Mobility charge 1 Mobility  Mobility Specialist Start Time (ACUTE ONLY) 1031  Mobility Specialist Stop Time (ACUTE ONLY) 1045  Mobility Specialist Time Calculation (min) (ACUTE ONLY) 14 min   Pt agreeable to mobility session. Required up to minA to steady during ambulation, d/t a few LOBs. No c/o throughout ambulation. Pt back in bed with all needs met, family in room.  Addison Lank Mobility Specialist Please contact via SecureChat or  Rehab office at [phone]

## 2023-05-16 NOTE — Progress Notes (Addendum)
Case discussed with CSW. Pt's chosen snf agreeable to transport pt to [facility] on TTS 10:45 am chair time at d/c. Pt for likely d/c tomorrow per staff (pt has not received HD today). Will add HD arrangements to pt's AVS and assist as needed.   Olivia Canter Renal Navigator [phone]  Addendum at 3:13 pm: Advised by CSW that pt will d/c today. Contacted [name] to advise clinic of pt's d/c today and that pt should resume care on Thursday. Clinic also advised of snf placement and facility name.

## 2023-05-16 NOTE — Discharge Planning (Signed)
[facility] Patient Discharge Orders- [facility] CLINIC: [name]  Patient's name: [name] Resner Sr. Admit/DC Dates: 05/09/2023 - 05/16/2023  Discharge Diagnoses: Acute Metabolic Encephalopathy    Aranesp: Given: No   Date and amount of last dose: NA  Last Hgb: 12.5 PRBC's Given: No Date/# of units: NA ESA dose for discharge: mircera 0 mcg IV q 2 weeks  IV Iron dose at discharge: Per protocol  Heparin change: No  EDW Change: Yes New EDW: 75 kg  Bath Change: No  Access intervention/Change: No Details:  Hectorol/Calcitriol change: No  Discharge Labs: Calcium 9.7 Phosphorus 3.9 Albumin 3.4 K+ 4.5  IV Antibiotics: No Details:  On Coumadin?: No Last INR: Next INR: Managed By:   OTHER/APPTS/LAB ORDERS: Going to [facility]    D/C Meds to be reconciled by nurse after every discharge.  Completed By: Alonna Buckler [facility] [phone]   Reviewed by: MD:______ RN_______

## 2023-05-16 NOTE — Progress Notes (Addendum)
[facility] PROGRESS NOTE    Progress Note  [name] Plan Sr.  UXL:[id number] DOB: 20-Oct-1960 DOA: 05/09/2023 PCP: Hoy Register, MD     Brief Narrative:   [name] Kenna Sr. is an 62 y.o. male past medical history of end-stage renal disease on hemodialysis Tuesday Thursday Saturdays, diabetes mellitus type 2 hyperlipidemia, history of alcohol abuse, peripheral neuropathy comes in for acute metabolic encephalopathy that has gotten worse over the last 3 weeks patient missed his last few days of his dialysis.  The patient also experienced couple of falls at home.  He lives with his mom his mom relates he is a daily cannabis user.  Assessment/Plan:   Acute respiratory failure with hypoxia/in the setting of end-stage renal disease: Was desatting in the ED requiring oxygen. Nephrology was consulted and he was dialyzed. He has been weaned to room air. PT evaluated the patient, will need skilled nursing facility placement, patient is medically stable for placement.  Acute metabolic encephalopathy: MRI of the brain showed no acute findings, just showed chronic small vessel disease. Status is improving since started dialysis. Seroquel and Celexa. He will need to be evaluated as an outpatient for dementia.  Uncontrolled hypertension: Currently on Norvasc, Coreg and lisinopril, blood pressure is controlled.  Newly diagnosed mildly Acute systolic heart failure: 2D echo on admission showed an EF of 40% grade 2 diastolic dysfunction. Question due to noncompliance with his medication. Continue beta-blocker and ACE inhibitor.  Hypovolemic hyponatremia: Noted.   DVT prophylaxis: lovenox Family Communication: Mother Status is: Inpatient Remains inpatient appropriate because: Pulmonary edema.    Code Status:     Code Status Orders  (From admission, onward)           Start     Ordered   05/09/23 1431  Full code  Continuous       Question:  By:  Answer:  Consent:  discussion documented in EHR   05/09/23 1430           Code Status History     Date Active Date Inactive Code Status Order ID Comments User Context   06/12/2018 2003 06/14/2018 1915 Full Code [id number]  Briscoe Deutscher, MD ED   09/14/2017 2035 09/15/2017 1615 Full Code [id number]  Donalee Citrin, MD Inpatient         IV Access:   Peripheral IV   Procedures and diagnostic studies:   No results found.   Medical Consultants:   None.   Subjective:    [name] Plan Sr. no pain is feels better.  Objective:    Vitals:   05/15/23 1911 05/15/23 2320 05/16/23 0334 05/16/23 0817  BP: 125/71 125/73 124/77 127/71  Pulse: 72 78 71 67  Resp: 16 16 16 14   Temp: (!) 97.3 F (36.3 C) 98.4 F (36.9 C)    TempSrc: Oral     SpO2: 98% 95% 97% 98%  Weight:      Height:       SpO2: 98 % O2 Flow Rate (L/min): 2 L/min  No intake or output data in the 24 hours ending 05/16/23 1019  Filed Weights   05/13/23 1444 05/13/23 1855 05/15/23 0543  Weight: 75.5 kg (S) 73.4 kg 75.1 kg    Exam: General exam: In no acute distress. Respiratory system: Good air movement and clear to auscultation. Cardiovascular system: S1 & S2 heard, RRR. No JVD. Gastrointestinal system: Abdomen is nondistended, soft and nontender.  Skin: No rashes, lesions or ulcers Psychiatry: Judgement and insight appear normal. Mood &  affect appropriate.  Data Reviewed:    Labs: Basic Metabolic Panel: Recent Labs  Lab 05/09/23 1108 05/09/23 1429 05/10/23 0457 05/12/23 0550  NA 133*  --  136 135  K 5.1  --  4.6 4.5  CL 90*  --  93* 92*  CO2 24  --  27 25  GLUCOSE 127*  --  111* 107*  BUN 51*  --  28* 32*  CREATININE 16.11*  --  10.97* 10.81*  CALCIUM 9.5  --  9.3 9.7  PHOS  --  3.9  --   --    GFR Estimated Creatinine Clearance: 7.3 mL/min (A) (by C-G formula based on SCr of 10.81 mg/dL (H)). Liver Function Tests: Recent Labs  Lab 05/09/23 1108  AST 23  ALT 12  ALKPHOS 58  BILITOT 0.8  PROT  6.2*  ALBUMIN 3.4*   Recent Labs  Lab 05/09/23 1108  LIPASE 73*   Recent Labs  Lab 05/09/23 1126  AMMONIA 24   Coagulation profile No results for input(s): "INR", "PROTIME" in the last 168 hours. COVID-19 Labs  No results for input(s): "DDIMER", "FERRITIN", "LDH", "CRP" in the last 72 hours.  No results found for: "SARSCOV2NAA"  CBC: Recent Labs  Lab 05/09/23 1108 05/10/23 0457 05/12/23 0550  WBC 11.1* 11.4* 9.4  NEUTROABS 8.6*  --  5.9  HGB 11.3* 11.1* 12.5*  HCT 33.3* 33.1* 37.5*  MCV 91.0 88.3 90.1  PLT 248 256 299   Cardiac Enzymes: No results for input(s): "CKTOTAL", "CKMB", "CKMBINDEX", "TROPONINI" in the last 168 hours. BNP (last 3 results) No results for input(s): "PROBNP" in the last 8760 hours. CBG: Recent Labs  Lab 05/15/23 0727 05/15/23 1153 05/15/23 1804 05/15/23 1912 05/16/23 0758  GLUCAP 146* 165* 158* 154* 139*   D-Dimer: No results for input(s): "DDIMER" in the last 72 hours. Hgb A1c: No results for input(s): "HGBA1C" in the last 72 hours. Lipid Profile: No results for input(s): "CHOL", "HDL", "LDLCALC", "TRIG", "CHOLHDL", "LDLDIRECT" in the last 72 hours. Thyroid function studies: No results for input(s): "TSH", "T4TOTAL", "T3FREE", "THYROIDAB" in the last 72 hours.  Invalid input(s): "FREET3" Anemia work up: No results for input(s): "VITAMINB12", "FOLATE", "FERRITIN", "TIBC", "IRON", "RETICCTPCT" in the last 72 hours. Sepsis Labs: Recent Labs  Lab 05/09/23 1108 05/10/23 0457 05/12/23 0550  WBC 11.1* 11.4* 9.4   Microbiology No results found for this or any previous visit (from the past 240 hour(s)).   Medications:    amLODipine  5 mg Oral QHS   calcitRIOL  0.75 mcg Oral Q T,Th,Sat-1800   carvedilol  6.25 mg Oral BID   Chlorhexidine Gluconate Cloth  6 each Topical Q0600   citalopram  20 mg Oral QHS   ezetimibe  10 mg Oral Daily   feeding supplement (NEPRO CARB STEADY)  237 mL Oral BID BM   ferric citrate  420 mg Oral  TID WC   folic acid  1 mg Oral Daily   heparin  5,000 Units Subcutaneous Q8H   insulin aspart  0-5 Units Subcutaneous QHS   insulin aspart  0-9 Units Subcutaneous TID WC   levETIRAcetam  500 mg Oral BID   lisinopril  5 mg Oral Daily   melatonin  3 mg Oral QHS   multivitamin with minerals  1 tablet Oral Daily   nicotine  21 mg Transdermal Daily   QUEtiapine  25 mg Oral QHS   rosuvastatin  10 mg Oral Daily   thiamine  100 mg Oral Daily  Or   thiamine  100 mg Intravenous Daily   Continuous Infusions:    LOS: 6 days   Marinda Elk  [facility]  05/16/2023, 10:19 AM

## 2023-05-16 NOTE — Progress Notes (Signed)
[name] [name] Progress Note  Subjective: seen in room. No c/o's today. In good spirits. Going to SNF.   Vitals:   05/16/23 0817 05/16/23 1025 05/16/23 1500 05/16/23 1522  BP: 127/71 115/62 116/70 107/66  Pulse: 67  74 76  Resp: 14  14 18   Temp:   98 F (36.7 C)   TempSrc:   Oral   SpO2: 98%  95% 96%  Weight:      Height:        Exam: Gen alert , no distress No jvd or bruits Chest clear bilat to bases RRR no RG Abd soft ntnd no mass or ascites +bs GU normal male Ext no LE edema Neuro is alert, nonfocal    RUA AVF+bruit    Renal-related home meds: - norvasc 5- 10 mg  - auryxia 2 ac tid - coreg 6.25 bid - lisinopril 5- 20 mg every day - lyrica 25 hs - lokelma 10gm wed and sundays      OP HD: TTS G-O  4h   400/800   87.5kg  400/800   RUA AVF  Heparin 4000 - last OP HD 11/07, post wt 87kg - get's to his dry wt and about 0.5kg under most sessions last 3 wks - rocaltrol 0.75 mcg - mircera 30 mcg IV q 4, last 10/31, next 11/28       Assessment/ Plan: Acute hypoxic resp failure - w/ missed HD and pulm edema by CXR on admit. Is not on O2 at home.  Is down 7kg from admission, and 12 kg under prior dry wt. Was 4L [place] on admit, is off O2 now on room air. Vol overload resolved.  AMS - better  ESKD - on HD TTS. Last outpt HD 11/07. HD today in progress. HTN - high BP's upon arrival, but w/ volume down BP's dropped. Lowered the dose of ACEi and norvasc yesterday by 50%. Keep SBP's > 110-115.  Volume - > 10 kg under dry wt, euvolemic on exam now. Lower dry wt to around 74- 75kg upon dc.  Anemia of eskd - Hb 11, next esa due on 11/28. Follow.  MBD ckd - CCa and phos are in range. Cont binders and po vdra.  Dispo -  for SNF dc today          Vinson Moselle MD  [facility] 05/16/2023, 3:24 PM  Recent Labs  Lab 05/10/23 0457 05/12/23 0550  HGB 11.1* 12.5*  CALCIUM 9.3 9.7  CREATININE 10.97* 10.81*  K 4.6 4.5   No results for input(s): "IRON", "TIBC", "FERRITIN"  in the last 168 hours. Inpatient medications:  amLODipine  5 mg Oral QHS   calcitRIOL  0.75 mcg Oral Q T,Th,Sat-1800   carvedilol  6.25 mg Oral BID   Chlorhexidine Gluconate Cloth  6 each Topical Q0600   citalopram  20 mg Oral QHS   ezetimibe  10 mg Oral Daily   feeding supplement (NEPRO CARB STEADY)  237 mL Oral BID BM   ferric citrate  420 mg Oral TID WC   folic acid  1 mg Oral Daily   heparin  5,000 Units Subcutaneous Q8H   heparin sodium (porcine)       insulin aspart  0-5 Units Subcutaneous QHS   insulin aspart  0-9 Units Subcutaneous TID WC   levETIRAcetam  500 mg Oral BID   lisinopril  5 mg Oral Daily   melatonin  3 mg Oral QHS   multivitamin with minerals  1 tablet Oral Daily  nicotine  21 mg Transdermal Daily   QUEtiapine  25 mg Oral QHS   rosuvastatin  10 mg Oral Daily   thiamine  100 mg Oral Daily   Or   thiamine  100 mg Intravenous Daily    albuterol, heparin sodium (porcine), hydrALAZINE, oxyCODONE, polyethylene glycol, prochlorperazine

## 2023-05-16 NOTE — Progress Notes (Signed)
Called facility multiple times to give report, no answer either time.

## 2023-05-17 ENCOUNTER — Telehealth: Payer: Self-pay

## 2023-05-17 NOTE — Transitions of Care (Post Inpatient/ED Visit) (Signed)
   05/17/2023  Name: [name] Spear Sr. MRN: [id number] DOB: 23-Dec-1960  Today's TOC FU Call Status: Today's TOC FU Call Status:: Unsuccessful Call (1st Attempt) Unsuccessful Call (1st Attempt) Date: 05/17/23  Attempted to reach the patient regarding the most recent Inpatient/ED visit.  Follow Up Plan: Additional outreach attempts will be made to reach the patient to complete the Transitions of Care (Post Inpatient/ED visit) call.   Alyse Low, RN, BA, [facility], CRRN [facility] Coordinator, Transition of Care Ph # [phone]

## 2023-05-18 ENCOUNTER — Telehealth: Payer: Self-pay

## 2023-05-18 NOTE — Transitions of Care (Post Inpatient/ED Visit) (Signed)
   05/18/2023  Name: [name] Lacki Sr. MRN: [id number] DOB: 1961-06-02  Today's TOC FU Call Status: Today's TOC FU Call Status:: Unsuccessful Call (2nd Attempt) Unsuccessful Call (2nd Attempt) Date: 05/18/23  Attempted to reach the patient regarding the most recent Inpatient/ED visit.  Follow Up Plan: Additional outreach attempts will be made to reach the patient to complete the Transitions of Care (Post Inpatient/ED visit) call.   Alyse Low, RN, BA, [facility], CRRN [facility] Coordinator, Transition of Care Ph # [phone]

## 2023-05-19 ENCOUNTER — Telehealth: Payer: Self-pay

## 2023-05-19 NOTE — Transitions of Care (Post Inpatient/ED Visit) (Signed)
   05/19/2023  Name: [name] Stadelman Sr. MRN: [id number] DOB: Feb 27, 1961  Today's TOC FU Call Status: Today's TOC FU Call Status:: Unsuccessful Call (3rd Attempt) Unsuccessful Call (3rd Attempt) Date: 05/19/23  Attempted to reach the patient regarding the most recent Inpatient/ED visit.  Follow Up Plan: No further outreach attempts will be made at this time. We have been unable to contact the patient.  Alyse Low, RN, BA, [facility], CRRN [facility] Coordinator, Transition of Care Ph # [phone]

## 2023-05-22 ENCOUNTER — Ambulatory Visit: Payer: Medicare Other | Admitting: Family Medicine

## 2023-05-24 ENCOUNTER — Ambulatory Visit: Payer: Medicare Other | Admitting: Critical Care Medicine

## 2023-06-12 ENCOUNTER — Other Ambulatory Visit: Payer: Self-pay

## 2023-06-12 ENCOUNTER — Other Ambulatory Visit: Payer: Self-pay | Admitting: Family Medicine

## 2023-06-12 DIAGNOSIS — G629 Polyneuropathy, unspecified: Secondary | ICD-10-CM

## 2023-06-12 DIAGNOSIS — R569 Unspecified convulsions: Secondary | ICD-10-CM

## 2023-06-13 ENCOUNTER — Other Ambulatory Visit: Payer: Self-pay

## 2023-06-13 MED ORDER — ROSUVASTATIN CALCIUM 10 MG PO TABS
10.0000 mg | ORAL_TABLET | Freq: Every day | ORAL | 0 refills | Status: DC
  Filled 2023-06-13: qty 30, 30d supply, fill #0

## 2023-06-13 MED ORDER — PREGABALIN 25 MG PO CAPS
25.0000 mg | ORAL_CAPSULE | Freq: Every day | ORAL | 0 refills | Status: DC
  Filled 2023-06-13: qty 30, 30d supply, fill #0

## 2023-06-13 MED ORDER — CITALOPRAM HYDROBROMIDE 40 MG PO TABS
40.0000 mg | ORAL_TABLET | Freq: Every day | ORAL | 0 refills | Status: DC
  Filled 2023-06-13: qty 30, 30d supply, fill #0

## 2023-06-13 MED ORDER — EZETIMIBE 10 MG PO TABS
10.0000 mg | ORAL_TABLET | Freq: Every day | ORAL | 0 refills | Status: DC
  Filled 2023-06-13: qty 30, 30d supply, fill #0

## 2023-06-13 MED ORDER — CARVEDILOL 6.25 MG PO TABS
6.2500 mg | ORAL_TABLET | Freq: Two times a day (BID) | ORAL | 0 refills | Status: DC
  Filled 2023-06-13: qty 60, 30d supply, fill #0

## 2023-06-13 MED ORDER — LISINOPRIL 5 MG PO TABS
5.0000 mg | ORAL_TABLET | Freq: Every day | ORAL | 0 refills | Status: DC
  Filled 2023-06-13: qty 30, 30d supply, fill #0

## 2023-06-13 MED ORDER — LEVETIRACETAM 500 MG PO TABS
500.0000 mg | ORAL_TABLET | Freq: Two times a day (BID) | ORAL | 0 refills | Status: DC
  Filled 2023-06-13: qty 60, 30d supply, fill #0

## 2023-06-13 MED ORDER — TRAZODONE HCL 50 MG PO TABS
50.0000 mg | ORAL_TABLET | Freq: Every day | ORAL | 0 refills | Status: DC
  Filled 2023-06-13: qty 30, 30d supply, fill #0

## 2023-06-13 MED ORDER — AMLODIPINE BESYLATE 5 MG PO TABS
5.0000 mg | ORAL_TABLET | Freq: Every day | ORAL | 0 refills | Status: DC
  Filled 2023-06-13: qty 30, 30d supply, fill #0

## 2023-06-13 MED ORDER — ALBUTEROL SULFATE HFA 108 (90 BASE) MCG/ACT IN AERS
2.0000 | INHALATION_SPRAY | Freq: Four times a day (QID) | RESPIRATORY_TRACT | 0 refills | Status: DC | PRN
  Filled 2023-06-13: qty 18, 25d supply, fill #0

## 2023-06-13 MED ORDER — SODIUM ZIRCONIUM CYCLOSILICATE 10 G PO PACK
10.0000 g | PACK | ORAL | 0 refills | Status: DC
  Filled 2023-06-13: qty 10, 35d supply, fill #0

## 2023-07-11 ENCOUNTER — Other Ambulatory Visit: Payer: Self-pay

## 2023-07-11 MED ORDER — LOKELMA 10 G PO PACK
1.0000 | PACK | ORAL | 3 refills | Status: DC
  Filled 2023-07-11: qty 48, 84d supply, fill #0

## 2023-07-11 MED ORDER — CARVEDILOL 6.25 MG PO TABS
6.2500 mg | ORAL_TABLET | Freq: Two times a day (BID) | ORAL | 3 refills | Status: DC
  Filled 2023-07-11: qty 180, 90d supply, fill #0

## 2023-07-12 ENCOUNTER — Other Ambulatory Visit: Payer: Self-pay

## 2023-07-12 ENCOUNTER — Other Ambulatory Visit: Payer: Self-pay | Admitting: Family Medicine

## 2023-07-13 ENCOUNTER — Other Ambulatory Visit: Payer: Self-pay

## 2023-07-13 MED ORDER — PREGABALIN 25 MG PO CAPS
25.0000 mg | ORAL_CAPSULE | Freq: Every day | ORAL | 0 refills | Status: DC
  Filled 2023-07-13: qty 30, 30d supply, fill #0

## 2023-07-13 MED ORDER — ROSUVASTATIN CALCIUM 10 MG PO TABS
10.0000 mg | ORAL_TABLET | Freq: Every day | ORAL | 0 refills | Status: DC
  Filled 2023-07-13: qty 30, 30d supply, fill #0

## 2023-07-13 MED ORDER — TRAZODONE HCL 50 MG PO TABS
50.0000 mg | ORAL_TABLET | Freq: Every day | ORAL | 0 refills | Status: DC
  Filled 2023-07-13: qty 30, 30d supply, fill #0

## 2023-07-13 MED ORDER — EZETIMIBE 10 MG PO TABS
10.0000 mg | ORAL_TABLET | Freq: Every day | ORAL | 0 refills | Status: DC
  Filled 2023-07-13: qty 30, 30d supply, fill #0

## 2023-07-13 MED ORDER — CITALOPRAM HYDROBROMIDE 40 MG PO TABS
40.0000 mg | ORAL_TABLET | Freq: Every day | ORAL | 0 refills | Status: DC
  Filled 2023-07-13: qty 30, 30d supply, fill #0

## 2023-07-13 MED ORDER — LEVETIRACETAM 500 MG PO TABS
500.0000 mg | ORAL_TABLET | Freq: Two times a day (BID) | ORAL | 0 refills | Status: DC
  Filled 2023-07-13: qty 60, 30d supply, fill #0

## 2023-07-13 MED ORDER — LISINOPRIL 5 MG PO TABS
5.0000 mg | ORAL_TABLET | Freq: Every day | ORAL | 0 refills | Status: DC
  Filled 2023-07-13: qty 30, 30d supply, fill #0

## 2023-07-14 ENCOUNTER — Other Ambulatory Visit: Payer: Self-pay

## 2023-07-17 ENCOUNTER — Other Ambulatory Visit: Payer: Self-pay

## 2023-07-25 ENCOUNTER — Other Ambulatory Visit: Payer: Self-pay

## 2023-07-25 MED ORDER — LISINOPRIL 10 MG PO TABS
10.0000 mg | ORAL_TABLET | Freq: Every day | ORAL | 3 refills | Status: DC
  Filled 2023-07-25: qty 90, 90d supply, fill #0

## 2023-08-04 ENCOUNTER — Other Ambulatory Visit: Payer: Self-pay

## 2023-08-04 ENCOUNTER — Other Ambulatory Visit: Payer: Self-pay | Admitting: Family Medicine

## 2023-08-04 NOTE — Telephone Encounter (Signed)
 Requested medication (s) are due for refill today: yes   Requested medication (s) are on the active medication list: yes   Last refill:  keppra - 07/13/23 #60 0 refills, lyrica - 07/13/23 #30 0 refills, lisinopril  07/13/23 #30 0 refills, crestor - 07/13/23 #30 0 refills, trazodone - 07/13/23 #30 0 refills  Future visit scheduled: yes in 2 weeks  Notes to clinic:  lyrica - not delegated per protocol. Do you want to give another courtesy refill?     Requested Prescriptions  Pending Prescriptions Disp Refills   levETIRAcetam  (KEPPRA ) 500 MG tablet 60 tablet 0    Sig: Take 1 tablet (500 mg total) by mouth 2 (two) times daily.     Neurology:  Anticonvulsants - levetiracetam  Failed - 08/04/2023  2:49 PM      Failed - Cr in normal range and within 360 days    Creatinine, Ser  Date Value Ref Range Status  05/16/2023 17.28 (H) 0.61 - 1.24 mg/dL Final   Creatinine, Urine  Date Value Ref Range Status  06/12/2018 68.91 mg/dL Final    Comment:    Performed at [facility], [address], [place], [place] [address]         Passed - Completed PHQ-2 or PHQ-9 in the last 360 days      Passed - Valid encounter within last 12 months    Recent Outpatient Visits           11 months ago Type 2 diabetes mellitus with chronic kidney disease on chronic dialysis, without long-term current use of insulin  [facility])   [place] [facility] Of [place]. [facility] [name], MD   1 year ago Type 2 diabetes mellitus with chronic kidney disease on chronic dialysis, without long-term current use of insulin  [facility])   [place] [facility] - A Dept Of [place]. [facility] [name], MD   2 years ago Type 2 diabetes mellitus with chronic kidney disease on chronic dialysis, without long-term current use of insulin  [facility])   [place] [facility] - A Dept Of [place]. [facility] [name], MD   2 years ago Type 2 diabetes mellitus with  stage 5 chronic kidney disease not on chronic dialysis, without long-term current use of insulin  (HCC)   [place] [facility] - [facility] Dept Of [place]. 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 [facility] [name], MD       Future Appointments             In 2 weeks [name], MD [facility] - A Dept Of [place]. [facility]             pregabalin  (LYRICA ) 25 MG capsule 30 capsule 0    Sig: Take 1 capsule (25 mg total) by mouth at bedtime.     Not Delegated - Neurology:  Anticonvulsants - Controlled - pregabalin  Failed - 08/04/2023  2:49 PM      Failed - This refill cannot be delegated      Failed - Cr in normal range and within 360 days    Creatinine, Ser  Date Value Ref Range Status  05/16/2023 17.28 (H) 0.61 - 1.24 mg/dL Final   Creatinine, Urine  Date Value Ref Range Status  06/12/2018 68.91 mg/dL Final  Comment:    Performed at [facility], [address], [place], [place] [address]         Passed - Completed PHQ-2 or PHQ-9 in the last 360 days      Passed - Valid encounter within last 12 months    Recent Outpatient Visits           11 months ago Type 2 diabetes mellitus with chronic kidney disease on chronic dialysis, without long-term current use of insulin  (HCC)   [place] [facility] Dept Of [place]. [facility] [name], MD   1 year ago Type 2 diabetes mellitus with chronic kidney disease on chronic dialysis, without long-term current use of insulin  [facility])   [place] [facility] A Dept Of [place]. [facility] [name], MD   2 years ago Type 2 diabetes mellitus with chronic kidney disease on chronic dialysis, without long-term current use of insulin  [facility])   [place] [facility] - A Dept Of [place]. [facility]  [name], MD   2 years ago Type 2 diabetes mellitus with stage 5 chronic kidney disease not on chronic dialysis, without long-term current use of insulin  [facility])   [place] [facility] - A Dept Of [place]. 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 [facility] [name], MD       Future Appointments             In 2 weeks [name], MD [facility] [place] - A Dept Of [place] DEL. [facility]             lisinopril  (ZESTRIL ) 5 MG tablet 30 tablet 0    Sig: Take 1 tablet (5 mg total) by mouth daily.     Cardiovascular:  ACE Inhibitors Failed - 08/04/2023  2:49 PM      Failed - Cr in normal range and within 180 days    Creatinine, Ser  Date Value Ref Range Status  05/16/2023 17.28 (H) 0.61 - 1.24 mg/dL Final   Creatinine, Urine  Date Value Ref Range Status  06/12/2018 68.91 mg/dL Final    Comment:    Performed at [facility], [address], [place], [place] [address]         Failed - Valid encounter within last 6 months    Recent Outpatient Visits           11 months ago Type 2 diabetes mellitus with chronic kidney disease on chronic dialysis, without long-term current use of insulin  (HCC)   [place] [facility] Dept Of [place]. [facility] [name], MD   1 year ago Type 2 diabetes mellitus with chronic kidney disease on chronic dialysis, without long-term current use of insulin  [facility])   [place] [facility] Dept Of [place]. [facility] [name], MD   2 years ago Type 2 diabetes mellitus with chronic kidney disease on chronic dialysis, without long-term current use of insulin  [facility])   [place] [facility] - A Dept Of [place]. [facility] [name], MD   2 years ago Type 2 diabetes mellitus with stage 5 chronic kidney disease  not on chronic dialysis, without long-term current use of insulin  (  HCC)   [place] [facility] - [facility] Dept Of [place]. 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 [facility] [name], MD       Future Appointments             In 2 weeks [name], MD [name] [place] - A Dept Of [place] DEL. [facility]            Passed - K in normal range and within 180 days    Potassium  Date Value Ref Range Status  05/16/2023 4.8 3.5 - 5.1 mmol/L Final         Passed - Patient is not pregnant      Passed - Last BP in normal range    BP Readings from Last 1 Encounters:  05/16/23 107/81          rosuvastatin  (CRESTOR ) 10 MG tablet 30 tablet 0    Sig: Take 1 tablet (10 mg total) by mouth daily.     Cardiovascular:  Antilipid - Statins 2 Failed - 08/04/2023  2:49 PM      Failed - Cr in normal range and within 360 days    Creatinine, Ser  Date Value Ref Range Status  05/16/2023 17.28 (H) 0.61 - 1.24 mg/dL Final   Creatinine, Urine  Date Value Ref Range Status  06/12/2018 68.91 mg/dL Final    Comment:    Performed at [facility], [address], [place], [place] [address]         Failed - Lipid Panel in normal range within the last 12 months    Cholesterol, Total  Date Value Ref Range Status  08/15/2022 134 100 - 199 mg/dL Final   LDL Chol Calc (NIH)  Date Value Ref Range Status  08/15/2022 69 0 - 99 mg/dL Final   HDL  Date Value Ref Range Status  08/15/2022 49 >39 mg/dL Final   Triglycerides  Date Value Ref Range Status  08/15/2022 84 0 - 149 mg/dL Final         Passed - Patient is not pregnant      Passed - Valid encounter within last 12 months    Recent Outpatient Visits           11 months ago Type 2 diabetes mellitus with chronic kidney disease on chronic dialysis, without long-term current use of insulin   (HCC)   [place] [facility] Dept Of [place]. [facility] [name], MD   1 year ago Type 2 diabetes mellitus with chronic kidney disease on chronic dialysis, without long-term current use of insulin  [facility])   [place] [facility] Dept Of [place]. [facility] [name], MD   2 years ago Type 2 diabetes mellitus with chronic kidney disease on chronic dialysis, without long-term current use of insulin  [facility])   [place] [facility] - A Dept Of [place]. [facility] [name], MD   2 years ago Type 2 diabetes mellitus with stage 5 chronic kidney disease not on chronic dialysis, without long-term current use of insulin  (HCC)   [place] [facility] - A Dept Of [place]. 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 [facility] [name], MD       Future Appointments             In 2 weeks [name], MD [facility] - A Dept Of [place]. [facility]             traZODone  (DESYREL ) 50 MG tablet 30 tablet 0    Sig: Take 1 tablet (50 mg total) by mouth at bedtime.     Psychiatry: Antidepressants - Serotonin Modulator Failed - 08/04/2023  2:49 PM      Failed - Valid encounter within last 6 months    Recent Outpatient Visits           11 months ago Type 2 diabetes mellitus with chronic kidney disease on chronic dialysis, without long-term current use of insulin  (HCC)   [place] [facility] Dept Of [place]. [facility] [name], MD   1 year ago Type 2 diabetes mellitus with chronic kidney disease on chronic dialysis, without long-term current use of insulin  [facility])   [place] [facility] - A Dept Of [place]. [facility] [name], MD   2 years ago Type 2 diabetes mellitus with chronic kidney  disease on chronic dialysis, without long-term current use of insulin  [facility])   [place] [facility] - A Dept Of [place]. [facility] [name], MD   2 years ago Type 2 diabetes mellitus with stage 5 chronic kidney disease not on chronic dialysis, without long-term current use of insulin  [facility])   [place] [facility] - A Dept Of [place]. 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 [facility] [name], MD       Future Appointments             In 2 weeks [name], MD [facility] [place] - A Dept Of [place]. [facility]            Passed - Completed PHQ-2 or PHQ-9 in the last 360 days

## 2023-08-09 ENCOUNTER — Telehealth: Payer: Self-pay | Admitting: Family Medicine

## 2023-08-09 ENCOUNTER — Other Ambulatory Visit: Payer: Self-pay

## 2023-08-09 NOTE — Telephone Encounter (Signed)
Call placed to patient and VM was left informing patient to return phone call.

## 2023-08-09 NOTE — Telephone Encounter (Signed)
Pt has concerns regarding pt meds! Please call pt asap!

## 2023-08-09 NOTE — Telephone Encounter (Signed)
Pt states that his appointment for 2/17 was moved to 2/24 and he will run out of medications. He is needing a refill on Lyrica, Trazadone,Keppra and Crestor sent to [facility]

## 2023-08-10 ENCOUNTER — Other Ambulatory Visit: Payer: Self-pay

## 2023-08-10 MED ORDER — LEVETIRACETAM 500 MG PO TABS
500.0000 mg | ORAL_TABLET | Freq: Two times a day (BID) | ORAL | 0 refills | Status: DC
  Filled 2023-08-10: qty 60, 30d supply, fill #0

## 2023-08-10 MED ORDER — PREGABALIN 25 MG PO CAPS
25.0000 mg | ORAL_CAPSULE | Freq: Every day | ORAL | 0 refills | Status: DC
  Filled 2023-08-10: qty 30, 30d supply, fill #0

## 2023-08-10 MED ORDER — TRAZODONE HCL 50 MG PO TABS
50.0000 mg | ORAL_TABLET | Freq: Every day | ORAL | 0 refills | Status: DC
  Filled 2023-08-10: qty 30, 30d supply, fill #0

## 2023-08-10 MED ORDER — ROSUVASTATIN CALCIUM 10 MG PO TABS
10.0000 mg | ORAL_TABLET | Freq: Every day | ORAL | 0 refills | Status: DC
  Filled 2023-08-10: qty 30, 30d supply, fill #0

## 2023-08-10 NOTE — Telephone Encounter (Signed)
Patient was called and informed of medication refills.

## 2023-08-10 NOTE — Addendum Note (Signed)
Addended by: Hoy Register on: 08/10/2023 12:53 PM   Modules accepted: Orders

## 2023-08-10 NOTE — Telephone Encounter (Signed)
Refilled

## 2023-08-14 ENCOUNTER — Ambulatory Visit: Payer: Medicare Other | Admitting: Family Medicine

## 2023-08-16 ENCOUNTER — Encounter: Payer: Self-pay | Admitting: Pharmacist

## 2023-08-16 ENCOUNTER — Other Ambulatory Visit: Payer: Self-pay

## 2023-08-21 ENCOUNTER — Encounter: Payer: Self-pay | Admitting: Family Medicine

## 2023-08-21 ENCOUNTER — Other Ambulatory Visit: Payer: Self-pay

## 2023-08-21 ENCOUNTER — Ambulatory Visit: Payer: Medicare Other | Attending: Family Medicine | Admitting: Family Medicine

## 2023-08-21 VITALS — BP 129/73 | HR 75 | Ht 74.0 in | Wt 193.0 lb

## 2023-08-21 DIAGNOSIS — Z79899 Other long term (current) drug therapy: Secondary | ICD-10-CM | POA: Diagnosis not present

## 2023-08-21 DIAGNOSIS — F418 Other specified anxiety disorders: Secondary | ICD-10-CM | POA: Diagnosis not present

## 2023-08-21 DIAGNOSIS — F17210 Nicotine dependence, cigarettes, uncomplicated: Secondary | ICD-10-CM | POA: Diagnosis not present

## 2023-08-21 DIAGNOSIS — Z9181 History of falling: Secondary | ICD-10-CM | POA: Diagnosis present

## 2023-08-21 DIAGNOSIS — M79675 Pain in left toe(s): Secondary | ICD-10-CM | POA: Diagnosis not present

## 2023-08-21 DIAGNOSIS — N186 End stage renal disease: Secondary | ICD-10-CM | POA: Diagnosis not present

## 2023-08-21 DIAGNOSIS — I69398 Other sequelae of cerebral infarction: Secondary | ICD-10-CM | POA: Insufficient documentation

## 2023-08-21 DIAGNOSIS — F0150 Vascular dementia without behavioral disturbance: Secondary | ICD-10-CM | POA: Insufficient documentation

## 2023-08-21 DIAGNOSIS — Z760 Encounter for issue of repeat prescription: Secondary | ICD-10-CM | POA: Diagnosis present

## 2023-08-21 DIAGNOSIS — F1011 Alcohol abuse, in remission: Secondary | ICD-10-CM | POA: Diagnosis not present

## 2023-08-21 DIAGNOSIS — I2510 Atherosclerotic heart disease of native coronary artery without angina pectoris: Secondary | ICD-10-CM | POA: Insufficient documentation

## 2023-08-21 DIAGNOSIS — R0989 Other specified symptoms and signs involving the circulatory and respiratory systems: Secondary | ICD-10-CM | POA: Diagnosis not present

## 2023-08-21 DIAGNOSIS — E1122 Type 2 diabetes mellitus with diabetic chronic kidney disease: Secondary | ICD-10-CM | POA: Insufficient documentation

## 2023-08-21 DIAGNOSIS — R569 Unspecified convulsions: Secondary | ICD-10-CM

## 2023-08-21 DIAGNOSIS — R197 Diarrhea, unspecified: Secondary | ICD-10-CM | POA: Insufficient documentation

## 2023-08-21 DIAGNOSIS — Z981 Arthrodesis status: Secondary | ICD-10-CM | POA: Diagnosis not present

## 2023-08-21 DIAGNOSIS — Z23 Encounter for immunization: Secondary | ICD-10-CM | POA: Diagnosis not present

## 2023-08-21 DIAGNOSIS — I120 Hypertensive chronic kidney disease with stage 5 chronic kidney disease or end stage renal disease: Secondary | ICD-10-CM | POA: Insufficient documentation

## 2023-08-21 DIAGNOSIS — Z8673 Personal history of transient ischemic attack (TIA), and cerebral infarction without residual deficits: Secondary | ICD-10-CM

## 2023-08-21 DIAGNOSIS — R296 Repeated falls: Secondary | ICD-10-CM | POA: Insufficient documentation

## 2023-08-21 DIAGNOSIS — F32A Depression, unspecified: Secondary | ICD-10-CM

## 2023-08-21 DIAGNOSIS — I69318 Other symptoms and signs involving cognitive functions following cerebral infarction: Secondary | ICD-10-CM | POA: Diagnosis not present

## 2023-08-21 DIAGNOSIS — I509 Heart failure, unspecified: Secondary | ICD-10-CM

## 2023-08-21 DIAGNOSIS — J438 Other emphysema: Secondary | ICD-10-CM

## 2023-08-21 DIAGNOSIS — Z992 Dependence on renal dialysis: Secondary | ICD-10-CM | POA: Diagnosis not present

## 2023-08-21 DIAGNOSIS — R42 Dizziness and giddiness: Secondary | ICD-10-CM | POA: Diagnosis present

## 2023-08-21 DIAGNOSIS — K7460 Unspecified cirrhosis of liver: Secondary | ICD-10-CM | POA: Insufficient documentation

## 2023-08-21 DIAGNOSIS — J439 Emphysema, unspecified: Secondary | ICD-10-CM | POA: Diagnosis not present

## 2023-08-21 LAB — POCT GLYCOSYLATED HEMOGLOBIN (HGB A1C): HbA1c, POC (controlled diabetic range): 6.4 % (ref 0.0–7.0)

## 2023-08-21 MED ORDER — CARVEDILOL 6.25 MG PO TABS
6.2500 mg | ORAL_TABLET | Freq: Two times a day (BID) | ORAL | 1 refills | Status: DC
  Filled 2023-08-21: qty 180, 90d supply, fill #0
  Filled 2023-12-01: qty 180, 90d supply, fill #1

## 2023-08-21 MED ORDER — HYDROXYZINE HCL 25 MG PO TABS
25.0000 mg | ORAL_TABLET | Freq: Three times a day (TID) | ORAL | 1 refills | Status: DC | PRN
  Filled 2023-08-21: qty 90, 30d supply, fill #0
  Filled 2023-12-01: qty 90, 30d supply, fill #1

## 2023-08-21 MED ORDER — AMLODIPINE BESYLATE 5 MG PO TABS
5.0000 mg | ORAL_TABLET | Freq: Every day | ORAL | 1 refills | Status: DC
  Filled 2023-08-21: qty 90, 90d supply, fill #0
  Filled 2023-12-01: qty 90, 90d supply, fill #1

## 2023-08-21 MED ORDER — CITALOPRAM HYDROBROMIDE 40 MG PO TABS
40.0000 mg | ORAL_TABLET | Freq: Every day | ORAL | 1 refills | Status: DC
  Filled 2023-08-21 – 2023-09-28 (×2): qty 90, 90d supply, fill #0
  Filled 2024-04-05: qty 90, 90d supply, fill #1

## 2023-08-21 MED ORDER — LEVETIRACETAM 500 MG PO TABS
500.0000 mg | ORAL_TABLET | Freq: Two times a day (BID) | ORAL | 1 refills | Status: DC
  Filled 2023-08-21 – 2023-09-18 (×2): qty 180, 90d supply, fill #0
  Filled 2023-12-01: qty 180, 90d supply, fill #1

## 2023-08-21 MED ORDER — TRAZODONE HCL 50 MG PO TABS
50.0000 mg | ORAL_TABLET | Freq: Every day | ORAL | 1 refills | Status: DC
  Filled 2023-08-21 – 2023-09-28 (×2): qty 90, 90d supply, fill #0
  Filled 2024-01-05 (×2): qty 90, 90d supply, fill #1

## 2023-08-21 MED ORDER — DICYCLOMINE HCL 10 MG PO CAPS
10.0000 mg | ORAL_CAPSULE | Freq: Three times a day (TID) | ORAL | 1 refills | Status: DC | PRN
  Filled 2023-08-21: qty 90, 30d supply, fill #0
  Filled 2023-12-01: qty 90, 30d supply, fill #1

## 2023-08-21 MED ORDER — PREGABALIN 25 MG PO CAPS
25.0000 mg | ORAL_CAPSULE | Freq: Every day | ORAL | 1 refills | Status: DC
  Filled 2023-08-21 – 2023-09-28 (×2): qty 90, 90d supply, fill #0
  Filled 2024-01-05: qty 90, 90d supply, fill #1

## 2023-08-21 MED ORDER — FLUTICASONE-SALMETEROL 100-50 MCG/ACT IN AEPB
1.0000 | INHALATION_SPRAY | Freq: Two times a day (BID) | RESPIRATORY_TRACT | 6 refills | Status: DC
  Filled 2023-08-21: qty 60, 30d supply, fill #0

## 2023-08-21 MED ORDER — ROSUVASTATIN CALCIUM 10 MG PO TABS
10.0000 mg | ORAL_TABLET | Freq: Every day | ORAL | 1 refills | Status: DC
  Filled 2023-08-21 – 2023-09-28 (×2): qty 90, 90d supply, fill #0
  Filled 2023-12-01 – 2024-04-05 (×3): qty 90, 90d supply, fill #1

## 2023-08-21 MED ORDER — ALBUTEROL SULFATE HFA 108 (90 BASE) MCG/ACT IN AERS
2.0000 | INHALATION_SPRAY | Freq: Four times a day (QID) | RESPIRATORY_TRACT | 0 refills | Status: DC | PRN
  Filled 2023-08-21 (×2): qty 18, 25d supply, fill #0

## 2023-08-21 NOTE — Patient Instructions (Signed)
 VISIT SUMMARY:  During today's visit, we discussed your recent falls, lightheadedness, and diarrhea, particularly after dialysis. We also addressed your foot pain and reviewed your general health maintenance needs, including an overdue eye exam and lung cancer screening.  YOUR PLAN:  -VASCULAR DEMENTIA: Vascular dementia is a decline in thinking skills caused by conditions that block or reduce blood flow to the brain. You will continue with your current management plan, including physical therapy and cognitive exercises.  -FALLS: Your recent falls and lightheadedness may be related to your blood pressure medication. We are reducing your Amlodipine dose from 10mg  to 5mg  nightly to see if this helps. If the falls or lightheadedness continue, we may refer you to a neurologist.  -DIARRHEA: Diarrhea, especially after iron infusions during dialysis, can lead to dehydration. We will inform your nephrologist about this side effect and prescribe Bentyl to be taken three times a day for 2-3 days after your iron infusions to help manage the diarrhea.  -FOOT PAIN: You have pain in your toe following a fall, and we need to check for a possible fracture. An x-ray has been ordered to assess this.  -GENERAL HEALTH MAINTENANCE: You are due for an eye exam and a lung cancer screening. We will refer you to an ophthalmologist for the eye exam and order a CT scan for lung cancer screening. Additionally, we will order an ABI test to check the circulation in your feet.  INSTRUCTIONS:  Please follow up with the ophthalmologist for your eye exam and schedule the lung cancer screening CT scan. Also, get the x-ray for your foot pain as soon as possible. If your falls or lightheadedness persist after reducing your Amlodipine dose, please let us know so we can consider a referral to a neurologist.

## 2023-08-21 NOTE — Progress Notes (Signed)
 Subjective:  Patient ID: [name] Plan Sr., male    DOB: 12-05-1960  Age: 63 y.o. MRN: [id number]  CC: Medical Management of Chronic Issues (Medication refills/)   HPI [name] Pesqueira Sr. is a 63 y.o. year old male with a history significant for type 2 diabetes mellitus (diet-controlled A1c 6.6), Seizures, ESRD (on HD T, Th, Sat) Fibrillary glomerulonephritis, alcohol abuse, hypertension, Nicotine dependence (1 ppd down from 2-3 ppd since age 61) coronary artery calcifications, ACDF of C4,5 and C5,6 on 09/14/17  By Dr Wynetta Emery secondary to Myelopathy, chronic right parietal lobe infarct,  seen for a follow-up visit.  In 04/2023 he was hospitalized for metabolic encephalopathy which was attributed to hypoxia. MRI brain revealed no acute intracranial abnormality but presence of small chronic right parietal lobe infarct in the MCA vascular territory. Accompanied by mom to this visit.  Interval History: Discussed the use of AI scribe software for clinical note transcription with the patient, who gave verbal consent to proceed.  He presents with recent falls and lightheadedness. The patient reports a fall about three to four weeks ago due to losing footing and feeling lightheaded.  He sustained a bruise in his left great toe and also thought he had fractured his left great toe but at this time denies presence of pain.  The patient's mom notes that the patient often appears unsteady on his feet.  In the fall of last year he did have an ED presentation after feeling lightheaded and falling in the store but this was thought to be secondary to hypotension postdialysis.  He denies any seizures and is adherent with Keppra.  Denies alcohol consumption.  Endorses adherence with his antihypertensive.   The patient also reports diarrhea, particularly after receiving iron infusions during dialysis. The diarrhea often starts around 1 am and continues throughout the night, leading to concerns about dehydration. The  patient also mentions a persistent pain in the lower abdomen that worsens with diarrhea.   Diabetes is diet controlled.  He is due for an eye exam. The patient has not had a lung cancer screening since 2022.        Past Medical History:  Diagnosis Date   Acute renal failure superimposed on stage 2 chronic kidney disease (HCC) 06/12/2018   Alcohol abuse 10/18/2017   Allergy, unspecified, initial encounter 03/26/2021   Anaphylactic shock, unspecified, initial encounter 03/26/2021   Anemia in chronic kidney disease 03/26/2021   Anxiety and depression 10/06/2017   Arthritis    Atherosclerotic heart disease of native coronary artery without angina pectoris 03/26/2021   Bilateral lower extremity edema 06/11/2018   Bradycardia with 41-50 beats per minute 05/05/2021   CAD (coronary artery disease) 10/06/2017   CHF (congestive heart failure) (HCC)    CKD (chronic kidney disease) 07/20/2018   CKD (chronic kidney disease) stage 5, GFR less than 15 ml/min (HCC)    dialysis T/Th/Sa   Complication of vascular dialysis catheter 03/26/2021   COPD (chronic obstructive pulmonary disease) (HCC) 09/29/2017   Coronary artery calcification seen on CT scan 10/09/2017   Cough    Diarrhea, unspecified 03/26/2021   Disorder of phosphorus metabolism, unspecified 07/28/2021   Dyspnea    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   Edema 07/20/2018   ESRD (end stage renal disease) (HCC)    TTHSAT  [name] [name]   Fever, unspecified 03/26/2021   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   Heart murmur    Hyperkalemia 06/12/2018   Hypertension 09/29/2017   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 Tremors whebn he moves it.   Shortness of breath 03/26/2021   Tobacco abuse 09/29/2017   Type 2 diabetes mellitus (HCC) 10/17/2011   diet controlled - per pt does not check CBG at home- insurance did not approve - 08/13/21   Type 2 diabetes mellitus with diabetic peripheral angiopathy without gangrene (HCC) 03/26/2021   Type 2 diabetes mellitus with other diabetic kidney complication (HCC) 03/26/2021    Past Surgical History:  Procedure Laterality Date   A/V FISTULAGRAM Right 12/06/2021   Procedure: A/V Fistulagram;  Surgeon: Maeola Harman, MD;  Location: [facility] INVASIVE CV LAB;  Service: Cardiovascular;  Laterality: Right;   ANTERIOR CERVICAL DECOMP/DISCECTOMY FUSION N/A 09/14/2017   Procedure: ANTERIOR CERVICAL DECOMPRESSION/DISCECTOMY FUSIONCERVICAL 4- CERVICAL 5, CERVICAL 5- CERVICAL 6;  Surgeon: Donalee Citrin, MD;  Location: [facility] OR;  Service: Neurosurgery;  Laterality: N/A;  ANTERIOR CERVICAL DECOMPRESSION/DISCECTOMY FUSIONCERVICAL 4- CERVICAL 5, CERVICAL 5- CERVICAL 6   AV FISTULA PLACEMENT Left 03/05/2021   Procedure: LEFT UPPER EXTREMITY RADIOBASILIC  ARTERIOVENOUS (AV) FISTULA CREATION;  Surgeon: Leonie Douglas, MD;  Location: [facility] OR;  Service: Vascular;  Laterality: Left;  PERIPHERAL NERVE BLOCK   AV FISTULA PLACEMENT Left 04/21/2021   Procedure: INSERTION OF  LEFT ARM ARTERIOVENOUS (AV) GORE-TEX GRAFT;  Surgeon: Leonie Douglas, MD;  Location: [facility] OR;  Service: Vascular;  Laterality: Left;  PERIPHERAL NERVE BLOCK   AV FISTULA PLACEMENT Right 06/30/2021   Procedure: RIGHT ARM ARTERIOVENOUS (AV) FISTULA;  Surgeon: Leonie Douglas, MD;  Location: [facility] OR;  Service: Vascular;  Laterality: Right;  PERIPHERAL NERVE BLOCK   BASCILIC VEIN TRANSPOSITION Right 08/16/2021   Procedure: RIGHT ARM SECOND STAGE BASILIC VEIN TRANSPOSITION;  Surgeon: Leonie Douglas, MD;  Location: [facility] OR;  Service: Vascular;  Laterality: Right;  PERIPHERAL  NERVE BLOCK, LOCAL GIVEN   IR FLUORO GUIDE CV LINE LEFT  03/25/2021   PERIPHERAL VASCULAR BALLOON ANGIOPLASTY Right 12/06/2021   Procedure: PERIPHERAL VASCULAR BALLOON ANGIOPLASTY;  Surgeon: Maeola Harman, MD;  Location: [facility] INVASIVE CV LAB;  Service: Cardiovascular;  Laterality: Right;   UPPER EXTREMITY VENOGRAPHY Bilateral 06/04/2021   Procedure: CENTRAL  AND UPPER VENOGRAPHY;  Surgeon: Leonie Douglas, MD;  Location: [facility] INVASIVE CV LAB;  Service: Cardiovascular;  Laterality: Bilateral;    Family History  Problem Relation Age of Onset   Diabetes Mother    Scoliosis Mother    Kidney cancer Father    Lung cancer Father    Hypertension Brother    Colon cancer Neg Hx    Pancreatic cancer Neg Hx    Prostate cancer Neg Hx    Rectal cancer Neg Hx    Stomach cancer Neg Hx     Social History   Socioeconomic History   Marital status: Single    Spouse name: Not on file   Number of children: Not on file   Years of education: Not on file   Highest education level: Not on file  Occupational History   Not on file  Tobacco Use   Smoking status: Every Day    Current packs/day: 1.50    Average packs/day: 1.5 packs/day for 42.0 years (  63.0 ttl pk-yrs)    Types: Cigarettes   Smokeless tobacco: Never   Tobacco comments:    1 pack a day   Vaping Use   Vaping status: Never Used  Substance and Sexual Activity   Alcohol use: Not Currently    Alcohol/week: 10.0 standard drinks of alcohol    Types: 10 Cans of beer per week    Comment: quit September 2022   Drug use: Yes    Types: Marijuana    Comment: ocassionally   Sexual activity: Not on file  Other Topics Concern   Not on file  Social History Narrative   Not on file   Social Drivers of Health   Financial Resource Strain: High Risk (08/21/2023)   Overall Financial Resource Strain (CARDIA)    Difficulty of Paying Living Expenses: Hard  Food Insecurity: Food Insecurity Present (08/21/2023)   Hunger Vital Sign    Worried  About Running Out of Food in the Last Year: Never true    Ran Out of Food in the Last Year: Sometimes true  Transportation Needs: No Transportation Needs (08/21/2023)   PRAPARE - Administrator, Civil Service (Medical): No    Lack of Transportation (Non-Medical): No  Physical Activity: Patient Declined (08/21/2023)   Exercise Vital Sign    Days of Exercise per Week: Patient declined    Minutes of Exercise per Session: Patient declined  Stress: No Stress Concern Present (08/21/2023)   [employer] of Occupational Health - Occupational Stress Questionnaire    Feeling of Stress : Not at all  Social Connections: Socially Isolated (08/21/2023)   Social Connection and Isolation Panel [NHANES]    Frequency of Communication with Friends and Family: Twice a week    Frequency of Social Gatherings with Friends and Family: Twice a week    Attends Religious Services: Never    Database administrator or Organizations: No    Attends Engineer, structural: Never    Marital Status: Divorced    No Known Allergies  Outpatient Medications Prior to Visit  Medication Sig Dispense Refill   Accu-Chek Softclix Lancets lancets Use as instructed tid before meals. Dx E11.22 100 each 2   AURYXIA 1 GM 210 MG(Fe) tablet Take 420 mg by mouth 2 (two) times daily before a meal.     Blood Glucose Monitoring Suppl (ACCU-CHEK GUIDE) w/Device KIT Use as directed 3 times daily. 1 kit 0   ezetimibe (ZETIA) 10 MG tablet Take 1 tablet (10 mg total) by mouth daily. 30 tablet 0   nicotine (NICODERM CQ) 14 mg/24hr patch Place 1 patch (14 mg total) onto the skin daily. 28 patch 0   QUEtiapine (SEROQUEL) 25 MG tablet Take 1 tablet (25 mg total) by mouth at bedtime.     sodium zirconium cyclosilicate (LOKELMA) 10 g PACK packet Take 1 packet by mouth as directed four days a week (on non-dialysis days) and as directed. 50 packet 3   albuterol (VENTOLIN HFA) 108 (90 Base) MCG/ACT inhaler Inhale 2 puffs into the  lungs every 6 (six) hours as needed for COPD. 18 g 0   amLODipine (NORVASC) 5 MG tablet Take 1 tablet (5 mg total) by mouth at bedtime. 30 tablet 0   carvedilol (COREG) 6.25 MG tablet Take 1 tablet (6.25 mg total) by mouth 2 (two) times daily. 180 tablet 3   citalopram (CELEXA) 40 MG tablet Take 1 tablet (40 mg total) by mouth daily.Must have office visit for refills 30 tablet 0  levETIRAcetam (KEPPRA) 500 MG tablet Take 1 tablet (500 mg total) by mouth 2 (two) times daily. 60 tablet 0   pregabalin (LYRICA) 25 MG capsule Take 1 capsule (25 mg total) by mouth at bedtime. 30 capsule 0   rosuvastatin (CRESTOR) 10 MG tablet Take 1 tablet (10 mg total) by mouth daily. 30 tablet 0   traZODone (DESYREL) 50 MG tablet Take 1 tablet (50 mg total) by mouth at bedtime. 30 tablet 0   glucose blood (ACCU-CHEK GUIDE) test strip Use as instructed 3 times daily. Dx E11.22 100 each 12   nicotine (NICODERM CQ - DOSED IN MG/24 HOURS) 21 mg/24hr patch Place 1 patch (21 mg total) onto the skin daily. 28 patch 0   sodium zirconium cyclosilicate (LOKELMA) 10 g PACK packet Take 10 gram by mouth twice a week as directed on non-dialysis days (Wed and Sunday) (Patient not taking: Reported on 05/09/2023) 9 packet 11   sodium zirconium cyclosilicate (LOKELMA) 10 g PACK packet Mix 10 g (1 packet) according to packet instructions and take by mouth every Wednesday and Saturday. 10 each 0   albuterol (VENTOLIN HFA) 108 (90 Base) MCG/ACT inhaler INHALE 2 PUFFS INTO THE LUNGS EVERY 6 (SIX) HOURS AS NEEDED FOR WHEEZING OR SHORTNESS OF BREATH. 18 g 2   amLODipine (NORVASC) 5 MG tablet Take 1 tablet (5 mg total) by mouth at bedtime.     carvedilol (COREG) 6.25 MG tablet Take 1 tablet by mouth twice a day 180 tablet 3   carvedilol (COREG) 6.25 MG tablet Take 1 tablet (6.25 mg total) by mouth 2 (two) times daily. 60 tablet 0   citalopram (CELEXA) 40 MG tablet Take 1 tablet (40 mg total) by mouth daily. 30 tablet 0   hydrOXYzine (ATARAX)  25 MG tablet Take 1 tablet (25 mg total) by mouth 3 (three) times daily as needed for anxiety.Must have office visit for refills. (Patient not taking: Reported on 08/21/2023) 90 tablet 0   lisinopril (ZESTRIL) 10 MG tablet Take 1 tablet by mouth once a day (Patient not taking: Reported on 08/21/2023) 90 tablet 3   lisinopril (ZESTRIL) 5 MG tablet Take 1 tablet (5 mg total) by mouth daily.     lisinopril (ZESTRIL) 5 MG tablet Take 1 tablet (5 mg total) by mouth daily. 30 tablet 0   rosuvastatin (CRESTOR) 10 MG tablet Take 1 tablet (10 mg total) by mouth daily. 90 tablet 1   No facility-administered medications prior to visit.     ROS Review of Systems  Constitutional:  Negative for activity change and appetite change.  HENT:  Negative for sinus pressure and sore throat.   Respiratory:  Negative for chest tightness, shortness of breath and wheezing.   Cardiovascular:  Negative for chest pain and palpitations.  Gastrointestinal:  Positive for abdominal pain and diarrhea. Negative for abdominal distention and constipation.  Genitourinary: Negative.   Musculoskeletal: Negative.   Psychiatric/Behavioral:  Negative for behavioral problems and dysphoric mood.     Objective:  BP 129/73   Pulse 75   Ht 6\' 2"  (1.88 m)   Wt 193 lb (87.5 kg)   SpO2 97%   BMI 24.78 kg/m      08/21/2023    9:41 AM 05/16/2023    5:54 PM 05/16/2023    4:42 PM  BP/Weight  Systolic BP 129 107 100  Diastolic BP 73 81 68  Wt. (Lbs) 193    BMI 24.78 kg/m2        Physical Exam Constitutional:  Appearance: He is well-developed.  Cardiovascular:     Rate and Rhythm: Normal rate.     Heart sounds: Normal heart sounds. No murmur heard. Pulmonary:     Effort: Pulmonary effort is normal.     Breath sounds: Normal breath sounds. No wheezing or rales.  Chest:     Chest wall: No tenderness.  Abdominal:     General: Bowel sounds are normal. There is no distension.     Palpations: Abdomen is soft. There is  no mass.     Tenderness: There is no abdominal tenderness.  Musculoskeletal:        General: Normal range of motion.     Right lower leg: No edema.     Left lower leg: No edema.  Skin:    Comments: Bruise on nail cuticle of left great toe  Neurological:     Mental Status: He is alert and oriented to person, place, and time.  Psychiatric:        Mood and Affect: Mood normal.    Diabetic Foot Exam - Simple   Simple Foot Form Diabetic Foot exam was performed with the following findings: Yes 08/21/2023 11:22 AM  Visual Inspection No deformities, no ulcerations, no other skin breakdown bilaterally: Yes Sensation Testing Intact to touch and monofilament testing bilaterally: Yes Pulse Check See comments: Yes Comments Non palpable dorsalis pedis and posterior tibialis bilaterally        Latest Ref Rng & Units 05/16/2023    3:20 PM 05/12/2023    5:50 AM 05/10/2023    4:57 AM  CMP  Glucose 70 - 99 mg/dL 409  811  914   BUN 8 - 23 mg/dL 74  32  28   Creatinine 0.61 - 1.24 mg/dL 78.29  56.21  30.86   Sodium 135 - 145 mmol/L 133  135  136   Potassium 3.5 - 5.1 mmol/L 4.8  4.5  4.6   Chloride 98 - 111 mmol/L 85  92  93   CO2 22 - 32 mmol/L 30  25  27    Calcium 8.9 - 10.3 mg/dL 9.8  9.7  9.3     Lipid Panel     Component Value Date/Time   CHOL 134 08/15/2022 0936   TRIG 84 08/15/2022 0936   HDL 49 08/15/2022 0936   CHOLHDL 3.6 02/17/2021 0924   LDLCALC 69 08/15/2022 0936    CBC    Component Value Date/Time   WBC 11.6 (H) 05/16/2023 1520   RBC 3.72 (L) 05/16/2023 1520   HGB 11.0 (L) 05/16/2023 1520   HCT 32.9 (L) 05/16/2023 1520   PLT 327 05/16/2023 1520   MCV 88.4 05/16/2023 1520   MCH 29.6 05/16/2023 1520   MCHC 33.4 05/16/2023 1520   RDW 13.9 05/16/2023 1520   LYMPHSABS 2.1 05/16/2023 1520   MONOABS 1.0 05/16/2023 1520   EOSABS 0.5 05/16/2023 1520   BASOSABS 0.1 05/16/2023 1520    Lab Results  Component Value Date   HGBA1C 6.4 08/21/2023    Assessment &  Plan:      Chronic right MCA infarct History of stroke with chronic infarct in the right parietal lobe. Patient reports improvement in disorientation after physical therapy and cognitive exercises. -Secondary risk factor modification -Patient states he was told he has vascular dementia during his hospitalization -Continue current management plan.  Falls Recent fall due to lightheadedness and disorientation. No clear association with dialysis. Blood pressure appears low normal. -Advised to hold off on antihypertensives on dialysis  days and take this after dialysis in the evenings -Reduce Amlodipine from 10mg  to 5mg  nightly to prevent potential hypotension (he states he is taking 10 mg of amlodipine even though his chart reveals he is on 5 mg) -Consider referral to neurologist if falls or lightheadedness persist after medication adjustment.  Seizures -No recent seizure -Continue Keppra   Anxiety and depression -Controlled on Celexa and hydroxyzine   End-stage renal disease -Continue hemodialysis per schedule   Emphysema -Advair does not appear on his med list even though he states he has been taking this -I have refilled this along with albuterol -Smoking cessation recommended   Diarrhea Reports of severe diarrhea, particularly after receiving iron infusions during dialysis. Concerns about dehydration. -Inform nephrologist about side effects from iron infusions. -Prescribe Bentyl to be taken three times a day for 2-3 days following iron infusions to manage diarrhea.  Foot Pain Reports of pain in the toe following a fall. Difficulty bending the toe. -Order x-ray to assess for potential fracture.  Type 2 diabetes mellitus -Diet controlled with A1c of 6.4 -Counseled on Diabetic diet, my plate method, 841 minutes of moderate intensity exercise/week Blood sugar logs with fasting goals of 80-120 mg/dl, random of less than 324 and in the event of sugars less than 60 mg/dl or  greater than 401 mg/dl encouraged to notify the clinic. Advised on the need for annual eye exams, annual foot exams, Pneumonia vaccine.   General Health Maintenance -Refer to ophthalmologist for overdue eye exam. -Order lung cancer screening CT due to history of smoking. -Order ABI to assess pedal pulses and circulation due to nonpalpable feet pulses during foot exam.          Meds ordered this encounter  Medications   amLODipine (NORVASC) 5 MG tablet    Sig: Take 1 tablet (5 mg total) by mouth at bedtime.    Dispense:  90 tablet    Refill:  1   carvedilol (COREG) 6.25 MG tablet    Sig: Take 1 tablet (6.25 mg total) by mouth 2 (two) times daily.    Dispense:  180 tablet    Refill:  1   hydrOXYzine (ATARAX) 25 MG tablet    Sig: Take 1 tablet (25 mg total) by mouth 3 (three) times daily as needed for anxiety.Must have office visit for refills.    Dispense:  90 tablet    Refill:  1   levETIRAcetam (KEPPRA) 500 MG tablet    Sig: Take 1 tablet (500 mg total) by mouth 2 (two) times daily.    Dispense:  180 tablet    Refill:  1   pregabalin (LYRICA) 25 MG capsule    Sig: Take 1 capsule (25 mg total) by mouth at bedtime.    Dispense:  90 capsule    Refill:  1   traZODone (DESYREL) 50 MG tablet    Sig: Take 1 tablet (50 mg total) by mouth at bedtime.    Dispense:  90 tablet    Refill:  1   citalopram (CELEXA) 40 MG tablet    Sig: Take 1 tablet (40 mg total) by mouth daily.Must have office visit for refills    Dispense:  90 tablet    Refill:  1   rosuvastatin (CRESTOR) 10 MG tablet    Sig: Take 1 tablet (10 mg total) by mouth daily.    Dispense:  90 tablet    Refill:  1   albuterol (VENTOLIN HFA) 108 (90 Base) MCG/ACT inhaler  Sig: Inhale 2 puffs into the lungs every 6 (six) hours as needed for COPD.    Dispense:  18 g    Refill:  0   fluticasone-salmeterol (ADVAIR) 100-50 MCG/ACT AEPB    Sig: Inhale 1 puff into the lungs 2 (two) times daily.    Dispense:  60 each     Refill:  6   dicyclomine (BENTYL) 10 MG capsule    Sig: Take 1 capsule (10 mg total) by mouth 3 (three) times daily as needed for spasms (Diarrhea).    Dispense:  90 capsule    Refill:  1    Follow-up: Return in about 3 months (around 11/18/2023) for Chronic medical conditions.    Visit required 49 minutes of patient care including median intraservice time, reviewing previous notes and test results, coordination of care, counseling the patient in addition to management of chronic medical conditions.Time also spent ordering medications, investigations and documenting in the chart.  All questions were answered to the patient's satisfaction    Hoy Register, MD, FAAFP. [facility] [place], [place] [phone]   08/21/2023, 10:58 AM

## 2023-08-23 ENCOUNTER — Ambulatory Visit (HOSPITAL_COMMUNITY)
Admission: RE | Admit: 2023-08-23 | Discharge: 2023-08-23 | Disposition: A | Discharge status: Home / Self Care | Payer: Medicare Other | Source: Ambulatory Visit | Attending: Family Medicine | Admitting: Family Medicine

## 2023-08-23 DIAGNOSIS — R0989 Other specified symptoms and signs involving the circulatory and respiratory systems: Secondary | ICD-10-CM | POA: Diagnosis present

## 2023-08-23 LAB — VAS US ABI WITH/WO TBI
Left ABI: 1.19
Right ABI: 1.21

## 2023-08-23 NOTE — Progress Notes (Signed)
 Ankle-brachial index completed. Please see CV Procedures for preliminary results.  Shona Simpson, RVT 08/23/23 10:14 AM

## 2023-08-24 ENCOUNTER — Encounter: Payer: Self-pay | Admitting: Family Medicine

## 2023-08-28 ENCOUNTER — Other Ambulatory Visit: Payer: Self-pay

## 2023-09-06 ENCOUNTER — Other Ambulatory Visit: Payer: Self-pay | Admitting: Family Medicine

## 2023-09-06 NOTE — Telephone Encounter (Signed)
 Copied from CRM [id number]. Topic: Clinical - Medication Refill >> Sep 06, 2023 12:03 PM Everette C wrote: Most Recent Primary Care Visit:  Provider: Hoy Register  Department: [facility]  Visit Type: OFFICE VISIT  Date: 08/21/2023  Medication: Rx #: [id number]  levETIRAcetam (KEPPRA) 500 MG tablet [[id number]]   Rx #: [id number]  nicotine (NICODERM CQ) 14 mg/24hr patch [[id number]]   Rx #: [id number]  traZODone (DESYREL) 50 MG tablet [[id number]]   Has the patient contacted their pharmacy? Yes (Agent: If no, request that the patient contact the pharmacy for the refill. If patient does not wish to contact the pharmacy document the reason why and proceed with request.) (Agent: If yes, when and what did the pharmacy advise?)  Is this the correct pharmacy for this prescription? Yes If no, delete pharmacy and type the correct one.  This is the patient's preferred pharmacy:  [facility] [address], [address] [place] [address] Phone: [phone] Fax: [phone]  Has the prescription been filled recently? No  Is the patient out of the medication? Yes  Has the patient been seen for an appointment in the last year OR does the patient have an upcoming appointment? Yes  Can we respond through MyChart? No  Agent: Please be advised that Rx refills may take up to 3 business days. We ask that you follow-up with your pharmacy.

## 2023-09-14 ENCOUNTER — Other Ambulatory Visit: Payer: Self-pay

## 2023-09-14 MED ORDER — AMLODIPINE BESYLATE 2.5 MG PO TABS
2.5000 mg | ORAL_TABLET | Freq: Every evening | ORAL | 3 refills | Status: DC
  Filled 2023-09-14: qty 90, 90d supply, fill #0

## 2023-09-18 ENCOUNTER — Ambulatory Visit
Admission: RE | Admit: 2023-09-18 | Discharge: 2023-09-18 | Disposition: A | Discharge status: Home / Self Care | Payer: Medicare Other | Source: Ambulatory Visit | Attending: Family Medicine | Admitting: Family Medicine

## 2023-09-18 ENCOUNTER — Other Ambulatory Visit: Payer: Self-pay

## 2023-09-18 DIAGNOSIS — F17210 Nicotine dependence, cigarettes, uncomplicated: Secondary | ICD-10-CM

## 2023-09-19 ENCOUNTER — Ambulatory Visit: Payer: Medicare Other | Attending: Family Medicine

## 2023-09-19 VITALS — Ht 74.0 in | Wt 197.3 lb

## 2023-09-19 DIAGNOSIS — Z0000 Encounter for general adult medical examination without abnormal findings: Secondary | ICD-10-CM | POA: Diagnosis not present

## 2023-09-19 DIAGNOSIS — Z1211 Encounter for screening for malignant neoplasm of colon: Secondary | ICD-10-CM

## 2023-09-19 DIAGNOSIS — F17210 Nicotine dependence, cigarettes, uncomplicated: Secondary | ICD-10-CM | POA: Diagnosis not present

## 2023-09-19 NOTE — Progress Notes (Signed)
 Because this visit was a virtual/telehealth visit,  certain criteria was not obtained, such a blood pressure, CBG if applicable, and timed get up and go. Any medications not marked as "taking" were not mentioned during the medication reconciliation part of the visit. Any vitals not documented were not able to be obtained due to this being a telehealth visit or patient was unable to self-report a recent blood pressure reading due to a lack of equipment at home via telehealth. Vitals that have been documented are verbally provided by the patient.   Subjective:   [name] Skoda Sr. is a 63 y.o. who presents for a Medicare Wellness preventive visit.  Visit Complete: Virtual I connected with  [name] Stangelo Sr. on 09/19/23 by a audio enabled telemedicine application and verified that I am speaking with the correct person using two identifiers.  Patient Location: Other:  Dialysis  Provider Location: Office/Clinic  I discussed the limitations of evaluation and management by telemedicine. The patient expressed understanding and agreed to proceed.  Vital Signs: Because this visit was a virtual/telehealth visit, some criteria may be missing or patient reported. Any vitals not documented were not able to be obtained and vitals that have been documented are patient reported.  VideoDeclined- This patient declined Librarian, academic. Therefore the visit was completed with audio only.  Persons Participating in Visit: Patient.  AWV Questionnaire: No: Patient Medicare AWV questionnaire was not completed prior to this visit.  Cardiac Risk Factors include: advanced age (>48men, >17 women);diabetes mellitus;dyslipidemia;family history of premature cardiovascular disease;hypertension;male gender;sedentary lifestyle;smoking/ tobacco exposure     Objective:    Today's Vitals   09/19/23 1343  Weight: 197 lb 5 oz (89.5 kg)  Height: 6\' 2"  (1.88 m)  PainSc: 0-No pain   Body mass  index is 25.33 kg/m.     09/19/2023    1:45 PM 05/09/2023    6:18 PM 05/09/2023    3:40 PM 09/12/2022    2:04 PM 12/06/2021    9:03 AM 09/03/2021    2:24 PM 08/16/2021    6:06 AM  Advanced Directives  Does Patient Have a Medical Advance Directive? No No No No No No No  Would patient like information on creating a medical advance directive? No - Patient declined No - Patient declined No - Patient declined  No - Patient declined No - Patient declined     Current Medications (verified) Outpatient Encounter Medications as of 09/19/2023  Medication Sig   Accu-Chek Softclix Lancets lancets Use as instructed tid before meals. Dx E11.22   albuterol (VENTOLIN HFA) 108 (90 Base) MCG/ACT inhaler Inhale 2 puffs into the lungs every 6 (six) hours as needed for COPD.   amLODipine (NORVASC) 2.5 MG tablet Take 1 tablet (2.5 mg total) by mouth Nightly.   amLODipine (NORVASC) 5 MG tablet Take 1 tablet (5 mg total) by mouth at bedtime.   AURYXIA 1 GM 210 MG(Fe) tablet Take 420 mg by mouth 2 (two) times daily before a meal.   Blood Glucose Monitoring Suppl (ACCU-CHEK GUIDE) w/Device KIT Use as directed 3 times daily.   carvedilol (COREG) 6.25 MG tablet Take 1 tablet (6.25 mg total) by mouth 2 (two) times daily.   citalopram (CELEXA) 40 MG tablet Take 1 tablet (40 mg total) by mouth daily.Must have office visit for refills   dicyclomine (BENTYL) 10 MG capsule Take 1 capsule (10 mg total) by mouth 3 (three) times daily as needed for spasms (Diarrhea).   ezetimibe (ZETIA) 10 MG  tablet Take 1 tablet (10 mg total) by mouth daily.   fluticasone-salmeterol (ADVAIR) 100-50 MCG/ACT AEPB Inhale 1 puff into the lungs 2 (two) times daily.   glucose blood (ACCU-CHEK GUIDE) test strip Use as instructed 3 times daily. Dx E11.22   hydrOXYzine (ATARAX) 25 MG tablet Take 1 tablet (25 mg total) by mouth 3 (three) times daily as needed for anxiety.Must have office visit for refills.   levETIRAcetam (KEPPRA) 500 MG tablet Take 1  tablet (500 mg total) by mouth 2 (two) times daily.   nicotine (NICODERM CQ - DOSED IN MG/24 HOURS) 21 mg/24hr patch Place 1 patch (21 mg total) onto the skin daily.   nicotine (NICODERM CQ) 14 mg/24hr patch Place 1 patch (14 mg total) onto the skin daily.   pregabalin (LYRICA) 25 MG capsule Take 1 capsule (25 mg total) by mouth at bedtime.   QUEtiapine (SEROQUEL) 25 MG tablet Take 1 tablet (25 mg total) by mouth at bedtime.   rosuvastatin (CRESTOR) 10 MG tablet Take 1 tablet (10 mg total) by mouth daily.   sodium zirconium cyclosilicate (LOKELMA) 10 g PACK packet Take 10 gram by mouth twice a week as directed on non-dialysis days (Wed and Sunday) (Patient not taking: Reported on 05/09/2023)   sodium zirconium cyclosilicate (LOKELMA) 10 g PACK packet Mix 10 g (1 packet) according to packet instructions and take by mouth every Wednesday and Saturday.   sodium zirconium cyclosilicate (LOKELMA) 10 g PACK packet Take 1 packet by mouth as directed four days a week (on non-dialysis days) and as directed.   traZODone (DESYREL) 50 MG tablet Take 1 tablet (50 mg total) by mouth at bedtime.   No facility-administered encounter medications on file as of 09/19/2023.    Allergies (verified) Patient has no known allergies.   History: Past Medical History:  Diagnosis Date   Acute renal failure superimposed on stage 2 chronic kidney disease (HCC) 06/12/2018   Alcohol abuse 10/18/2017   Allergy, unspecified, initial encounter 03/26/2021   Anaphylactic shock, unspecified, initial encounter 03/26/2021   Anemia in chronic kidney disease 03/26/2021   Anxiety and depression 10/06/2017   Arthritis    Atherosclerotic heart disease of native coronary artery without angina pectoris 03/26/2021   Bilateral lower extremity edema 06/11/2018   Bradycardia with 41-50 beats per minute 05/05/2021   CAD (coronary artery disease) 10/06/2017   CHF (congestive heart failure) (HCC)    CKD (chronic kidney disease) 07/20/2018    CKD (chronic kidney disease) stage 5, GFR less than 15 ml/min (HCC)    dialysis T/Th/Sa   Complication of vascular dialysis catheter 03/26/2021   COPD (chronic obstructive pulmonary disease) (HCC) 09/29/2017   Coronary artery calcification seen on CT scan 10/09/2017   Cough    Diarrhea, unspecified 03/26/2021   Disorder of phosphorus metabolism, unspecified 07/28/2021   Dyspnea    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   Edema 07/20/2018   ESRD (end stage renal disease) (HCC)    TTHSAT  [name]   Fever, unspecified 03/26/2021   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 Tremors whebn he moves it.   Shortness of breath 03/26/2021   Tobacco abuse 09/29/2017   Type 2 diabetes mellitus (HCC) 10/17/2011   diet controlled - per pt does not check CBG at home- insurance did not approve - 08/13/21   Type 2 diabetes mellitus with diabetic peripheral angiopathy without gangrene (HCC) 03/26/2021   Type 2 diabetes mellitus with other diabetic kidney complication (HCC) 03/26/2021   Past Surgical History:  Procedure Laterality Date   A/V FISTULAGRAM Right 12/06/2021   Procedure: A/V Fistulagram;  Surgeon: Maeola Harman, MD;  Location: [facility] INVASIVE CV LAB;  Service: Cardiovascular;  Laterality: Right;   ANTERIOR CERVICAL DECOMP/DISCECTOMY FUSION N/A 09/14/2017   Procedure: ANTERIOR CERVICAL DECOMPRESSION/DISCECTOMY FUSIONCERVICAL 4- CERVICAL 5, CERVICAL 5- CERVICAL 6;  Surgeon: Donalee Citrin, MD;  Location:  [facility] OR;  Service: Neurosurgery;  Laterality: N/A;  ANTERIOR CERVICAL DECOMPRESSION/DISCECTOMY FUSIONCERVICAL 4- CERVICAL 5, CERVICAL 5- CERVICAL 6   AV FISTULA PLACEMENT Left 03/05/2021   Procedure: LEFT UPPER EXTREMITY RADIOBASILIC  ARTERIOVENOUS (AV) FISTULA CREATION;  Surgeon: Leonie Douglas, MD;  Location: [facility] OR;  Service: Vascular;  Laterality: Left;  PERIPHERAL NERVE BLOCK   AV FISTULA PLACEMENT Left 04/21/2021   Procedure: INSERTION OF  LEFT ARM ARTERIOVENOUS (AV) GORE-TEX GRAFT;  Surgeon: Leonie Douglas, MD;  Location: [facility] OR;  Service: Vascular;  Laterality: Left;  PERIPHERAL NERVE BLOCK   AV FISTULA PLACEMENT Right 06/30/2021   Procedure: RIGHT ARM ARTERIOVENOUS (AV) FISTULA;  Surgeon: Leonie Douglas, MD;  Location: [facility] OR;  Service: Vascular;  Laterality: Right;  PERIPHERAL NERVE BLOCK   BASCILIC VEIN TRANSPOSITION Right 08/16/2021   Procedure: RIGHT ARM SECOND STAGE BASILIC VEIN TRANSPOSITION;  Surgeon: Leonie Douglas, MD;  Location: [facility] OR;  Service: Vascular;  Laterality: Right;  PERIPHERAL NERVE BLOCK, LOCAL GIVEN   IR FLUORO GUIDE CV LINE LEFT  03/25/2021   PERIPHERAL VASCULAR BALLOON ANGIOPLASTY Right 12/06/2021   Procedure: PERIPHERAL VASCULAR BALLOON ANGIOPLASTY;  Surgeon: Maeola Harman, MD;  Location: [facility] INVASIVE CV LAB;  Service: Cardiovascular;  Laterality: Right;   UPPER EXTREMITY VENOGRAPHY Bilateral 06/04/2021   Procedure: CENTRAL  AND UPPER VENOGRAPHY;  Surgeon: Leonie Douglas, MD;  Location: [facility] INVASIVE CV LAB;  Service: Cardiovascular;  Laterality: Bilateral;   Family History  Problem Relation Age of Onset   Diabetes Mother    Scoliosis Mother    Kidney cancer Father    Lung cancer Father    Hypertension Brother    Colon cancer Neg Hx    Pancreatic cancer Neg Hx    Prostate cancer Neg Hx    Rectal cancer Neg Hx    Stomach cancer Neg Hx    Social History   Socioeconomic History   Marital status: Single    Spouse name: Not on file   Number of  children: Not on file   Years of education: Not on file   Highest education level: Not on file  Occupational History   Not on file  Tobacco Use   Smoking status: Every Day    Current packs/day: 1.50    Average packs/day: 1.5 packs/day for 42.0 years (63.0 ttl pk-yrs)    Types: Cigarettes   Smokeless tobacco: Never   Tobacco comments:    1 pack a day   Vaping Use   Vaping status: Never Used  Substance and Sexual Activity   Alcohol use: Not Currently    Alcohol/week: 10.0 standard drinks of alcohol    Types: 10  Cans of beer per week    Comment: quit September 2022   Drug use: Yes    Types: Marijuana    Comment: ocassionally   Sexual activity: Not on file  Other Topics Concern   Not on file  Social History Narrative   Not on file   Social Drivers of Health   Financial Resource Strain: High Risk (09/19/2023)   Overall Financial Resource Strain (CARDIA)    Difficulty of Paying Living Expenses: Hard  Food Insecurity: No Food Insecurity (09/19/2023)   Hunger Vital Sign    Worried About Running Out of Food in the Last Year: Never true    Ran Out of Food in the Last Year: Never true  Recent Concern: Food Insecurity - Food Insecurity Present (08/21/2023)   Hunger Vital Sign    Worried About Running Out of Food in the Last Year: Never true    Ran Out of Food in the Last Year: Sometimes true  Transportation Needs: No Transportation Needs (09/19/2023)   PRAPARE - Administrator, Civil Service (Medical): No    Lack of Transportation (Non-Medical): No  Physical Activity: Patient Declined (09/19/2023)   Exercise Vital Sign    Days of Exercise per Week: Patient declined    Minutes of Exercise per Session: Patient declined  Stress: No Stress Concern Present (09/19/2023)   [employer] of Occupational Health - Occupational Stress Questionnaire    Feeling of Stress : Not at all  Social Connections: Socially Isolated (09/19/2023)   Social Connection and Isolation Panel  [NHANES]    Frequency of Communication with Friends and Family: Twice a week    Frequency of Social Gatherings with Friends and Family: Twice a week    Attends Religious Services: Never    Database administrator or Organizations: No    Attends Engineer, structural: Never    Marital Status: Divorced    Tobacco Counseling Ready to quit: Not Answered Counseling given: Not Answered Tobacco comments: 1 pack a day     Clinical Intake:  Pre-visit preparation completed: Yes  Pain : No/denies pain Pain Score: 0-No pain     BMI - recorded: 25.33 Nutritional Status: BMI 25 -29 Overweight Nutritional Risks: None Diabetes: Yes CBG done?: No Did pt. bring in CBG monitor from home?: No  Lab Results  Component Value Date   HGBA1C 6.4 08/21/2023   HGBA1C 6.1 (H) 05/09/2023   HGBA1C 6.5 08/15/2022     How often do you need to have someone help you when you read instructions, pamphlets, or other written materials from your doctor or pharmacy?: 1 - Never  Interpreter Needed?: No  Information entered by :: Tyese Finken N. Paislea Hatton, LPN.   Activities of Daily Living     09/19/2023    1:48 PM 05/09/2023    6:21 PM  In your present state of health, do you have any difficulty performing the following activities:  Hearing? 0 1  Vision? 0 1  Difficulty concentrating or making decisions? 0 1  Walking or climbing stairs? 1   Dressing or bathing? 0   Doing errands, shopping? 0   Preparing Food and eating ? N   Using the Toilet? N   In the past six months, have you accidently leaked urine? N   Do you have problems with loss of bowel control? N   Managing your Medications? N   Managing your Finances? N   Housekeeping or managing your Housekeeping? N     Patient  Care Team: Hoy Register, MD as PCP - General (Family Medicine) Kandy Garrison [facility] as Consulting Physician (Ophthalmology)  Indicate any recent Medical Services you  may have received from other than [facility] providers in the past year (date may be approximate).     Assessment:   This is a routine wellness examination for [facility].  Hearing/Vision screen Hearing Screening - Comments:: Denies hearing difficulties.  Vision Screening - Comments:: Wears rx glasses - up to date with routine eye exams with [facility]    Goals Addressed             This Visit's Progress    Quit Smoking       Quit Smoking Goals: Manage cravings and triggers by practicing mindfulness and relaxation techniques. Create a personalized quit plan. Enlist support from friends and family. Reward yourself for accomplishments. Find alternative activities. Make healthier choices. Prepare for difficult situations. Identify reasons to quit.       Depression Screen     09/19/2023    1:46 PM 08/21/2023    9:45 AM 09/12/2022    2:05 PM 08/15/2022    8:46 AM 02/03/2022    8:49 AM 09/03/2021   11:20 AM 05/18/2021    9:05 AM  PHQ 2/9 Scores  PHQ - 2 Score 0 0 0 0 2 0 0  PHQ- 9 Score 3 3  3 10   0    Fall Risk     09/19/2023    1:46 PM 08/21/2023    9:45 AM 09/12/2022    2:05 PM 08/15/2022    8:38 AM 02/03/2022    8:45 AM  Fall Risk   Falls in the past year? 1 1 0 1 0  Number falls in past yr: 1 1 0 1 0  Injury with Fall? 1 1 0 0 0  Risk for fall due to : History of fall(s);Impaired balance/gait;Orthopedic patient History of fall(s) Medication side effect;Impaired mobility;Impaired balance/gait History of fall(s)   Follow up Falls prevention discussed;Falls evaluation completed Falls evaluation completed Falls prevention discussed;Education provided;Falls evaluation completed      MEDICARE RISK AT HOME:  Medicare Risk at Home Any stairs in or around the home?: No If so, are there any without handrails?: No Home free of loose throw rugs in walkways, pet beds, electrical cords, etc?: Yes Adequate lighting in your home to reduce risk of falls?: Yes Life alert?: No Use of  a cane, walker or w/c?: Yes Grab bars in the bathroom?: Yes Shower chair or bench in shower?: Yes Elevated toilet seat or a handicapped toilet?: No  TIMED UP AND GO:  Was the test performed?  No  Cognitive Function: 6CIT completed    09/19/2023    1:47 PM  MMSE - Mini Mental State Exam  Not completed: Unable to complete        09/19/2023    1:47 PM 09/12/2022    2:07 PM 09/03/2021   11:14 AM  6CIT Screen  What Year? 0 points 0 points 0 points  What month? 0 points 0 points 0 points  What time? 0 points 0 points 0 points  Count back from 20 0 points 0 points 0 points  Months in reverse 0 points 4 points 0 points  Repeat phrase 0 points 0 points 0 points  Total Score 0 points 4 points 0 points    Immunizations Immunization History  Administered Date(s) Administered   Hepb-cpg 04/17/2021, 05/13/2021, 07/15/2021, 08/12/2021   Influenza,  High Dose Seasonal PF 06/13/2018, 04/01/2021   Influenza, Mdck, Trivalent,PF 6+ MOS(egg free) 04/20/2023   Influenza, Quadrivalent, Recombinant, Inj, Pf 04/05/2022   Influenza-Unspecified 03/30/2021   Moderna Sars-Covid-2 Vaccination 09/18/2019, 10/16/2019   PNEUMOCOCCAL CONJUGATE-20 02/15/2021   Pneumococcal Polysaccharide-23 06/13/2018   Zoster Recombinant(Shingrix) 08/21/2023    Screening Tests Health Maintenance  Topic Date Due   COVID-19 Vaccine (3 - Moderna risk series) 11/13/2019   Lung Cancer Screening  02/22/2022   OPHTHALMOLOGY EXAM  03/22/2022   Fecal DNA (Cologuard)  02/19/2023   Zoster Vaccines- Shingrix (2 of 2) 10/16/2023   HEMOGLOBIN A1C  02/18/2024   FOOT EXAM  08/20/2024   Medicare Annual Wellness (AWV)  09/18/2024   Pneumococcal Vaccine 76-27 Years old  Completed   INFLUENZA VACCINE  Completed   Hepatitis C Screening  Completed   HIV Screening  Completed   HPV VACCINES  Aged Out   DTaP/Tdap/Td  Discontinued   Colonoscopy  Discontinued    Health Maintenance  Health Maintenance Due  Topic Date Due    COVID-19 Vaccine (3 - Moderna risk series) 11/13/2019   Lung Cancer Screening  02/22/2022   OPHTHALMOLOGY EXAM  03/22/2022   Fecal DNA (Cologuard)  02/19/2023   Health Maintenance Items Addressed: Referral sent to GI for colonoscopy, Lung Cancer Screening ordered  Additional Screening:  Vision Screening: Recommended annual ophthalmology exams for early detection of glaucoma and other disorders of the eye.  Dental Screening: Recommended annual dental exams for proper oral hygiene  Community Resource Referral / Chronic Care Management: CRR required this visit?  No   CCM required this visit?  No     Plan:     I have personally reviewed and noted the following in the patient's chart:   Medical and social history Use of alcohol, tobacco or illicit drugs  Current medications and supplements including opioid prescriptions. Patient is not currently taking opioid prescriptions. Functional ability and status Nutritional status Physical activity Advanced directives List of other physicians Hospitalizations, surgeries, and ER visits in previous 12 months Vitals Screenings to include cognitive, depression, and falls Referrals and appointments  In addition, I have reviewed and discussed with patient certain preventive protocols, quality metrics, and best practice recommendations. A written personalized care plan for preventive services as well as general preventive health recommendations were provided to patient.     Mickeal Needy, LPN   [id number]   After Visit Summary: (MyChart) Due to this being a telephonic visit, the after visit summary with patients personalized plan was offered to patient via MyChart   Notes: Please refer to Routing Comments.

## 2023-09-19 NOTE — Patient Instructions (Addendum)
 Mr. [name] [name] , Thank you for taking time to come for your Medicare Wellness Visit. I appreciate your ongoing commitment to your health goals. Please review the following plan we discussed and let me know if I can assist you in the future.   Referrals/Orders/Follow-Ups/Clinician Recommendations: Yes, an order was placed for you to have a colonoscopy [name] will call to set up appointment; we could not order Cologuard because the last one was positive for blood). Also a Lung Cancer Screening has been order at [facility] (they will call you to schedule) due to history of smoking. Keep maintaining your health by keeping your appointments with Dr. Alvis Lemmings and any specialists that you may see.  Call us if you need anything.  Have a great year!!!!  This is a list of the screening recommended for you and due dates:  Health Maintenance  Topic Date Due   COVID-19 Vaccine (3 - Moderna risk series) 11/13/2019   Screening for Lung Cancer  02/22/2022   Eye exam for diabetics  03/22/2022   Cologuard (Stool DNA test)  02/19/2023   Zoster (Shingles) Vaccine (2 of 2) 10/16/2023   Hemoglobin A1C  02/18/2024   Complete foot exam   08/20/2024   Medicare Annual Wellness Visit  09/18/2024   Pneumococcal Vaccination  Completed   Flu Shot  Completed   Hepatitis C Screening  Completed   HIV Screening  Completed   HPV Vaccine  Aged Out   DTaP/Tdap/Td vaccine  Discontinued   Colon Cancer Screening  Discontinued    Advanced directives: (Declined) Advance directive discussed with you today. Even though you declined this today, please call our office should you change your mind, and we can give you the proper paperwork for you to fill out.  Next Medicare Annual Wellness Visit scheduled for next year: Yes

## 2023-09-25 ENCOUNTER — Other Ambulatory Visit: Payer: Self-pay

## 2023-09-25 MED ORDER — SODIUM ZIRCONIUM CYCLOSILICATE 10 G PO PACK: PACK | ORAL | 0 refills | Status: DC

## 2023-09-28 ENCOUNTER — Other Ambulatory Visit: Payer: Self-pay

## 2023-09-28 ENCOUNTER — Other Ambulatory Visit: Payer: Self-pay | Admitting: Family Medicine

## 2023-09-28 MED ORDER — LOKELMA 10 G PO PACK
1.0000 | PACK | Freq: Every day | ORAL | 3 refills | Status: DC
  Filled 2023-09-28: qty 90, 90d supply, fill #0

## 2023-09-28 MED ORDER — EZETIMIBE 10 MG PO TABS
10.0000 mg | ORAL_TABLET | Freq: Every day | ORAL | 0 refills | Status: DC
  Filled 2023-09-28: qty 30, 30d supply, fill #0

## 2023-09-29 ENCOUNTER — Other Ambulatory Visit: Payer: Self-pay

## 2023-10-23 ENCOUNTER — Telehealth: Payer: Self-pay | Admitting: Family Medicine

## 2023-10-23 NOTE — Telephone Encounter (Signed)
 Copied from CRM [id number]. Topic: General - Other  >> Oct 23, 2023 12:06 PM Yolanda T wrote: Reason for CRM: Tiffany from [facility]iology [phone] called to speak with nurse in reference to test results. Please contact Tiffany to let her know if results were recvd or not

## 2023-10-23 NOTE — Telephone Encounter (Signed)
 Returned call to [facility] Radiology and no one answered. If it's in regards to the CT results are in epic they are in there and provider will take a look

## 2023-10-24 ENCOUNTER — Encounter: Payer: Self-pay | Admitting: Family Medicine

## 2023-11-07 ENCOUNTER — Other Ambulatory Visit: Payer: Self-pay

## 2023-11-07 MED ORDER — LOKELMA 10 G PO PACK: 10.0000 g | PACK | Freq: Every day | ORAL | 3 refills | Status: DC

## 2023-11-22 ENCOUNTER — Ambulatory Visit: Payer: Medicare Other | Admitting: Family Medicine

## 2023-12-01 ENCOUNTER — Other Ambulatory Visit (HOSPITAL_COMMUNITY): Payer: Self-pay

## 2023-12-01 ENCOUNTER — Other Ambulatory Visit: Payer: Self-pay

## 2023-12-01 ENCOUNTER — Other Ambulatory Visit: Payer: Self-pay | Admitting: Family Medicine

## 2023-12-01 MED ORDER — EZETIMIBE 10 MG PO TABS
10.0000 mg | ORAL_TABLET | Freq: Every day | ORAL | 0 refills | Status: DC
Start: 2023-12-01 — End: 2024-01-05
  Filled 2023-12-01: qty 30, 30d supply, fill #0

## 2023-12-13 ENCOUNTER — Other Ambulatory Visit: Payer: Self-pay

## 2023-12-14 ENCOUNTER — Telehealth: Payer: Self-pay | Admitting: Family Medicine

## 2023-12-14 NOTE — Telephone Encounter (Signed)
 Contacted pt to confirm appt

## 2023-12-18 ENCOUNTER — Other Ambulatory Visit: Payer: Self-pay

## 2023-12-18 ENCOUNTER — Ambulatory Visit: Attending: Family Medicine | Admitting: Family Medicine

## 2023-12-18 ENCOUNTER — Encounter: Payer: Self-pay | Admitting: Family Medicine

## 2023-12-18 VITALS — BP 123/70 | HR 72 | Ht 74.0 in | Wt 199.4 lb

## 2023-12-18 DIAGNOSIS — I2510 Atherosclerotic heart disease of native coronary artery without angina pectoris: Secondary | ICD-10-CM | POA: Diagnosis not present

## 2023-12-18 DIAGNOSIS — N185 Chronic kidney disease, stage 5: Secondary | ICD-10-CM

## 2023-12-18 DIAGNOSIS — R911 Solitary pulmonary nodule: Secondary | ICD-10-CM | POA: Insufficient documentation

## 2023-12-18 DIAGNOSIS — Z992 Dependence on renal dialysis: Secondary | ICD-10-CM | POA: Diagnosis not present

## 2023-12-18 DIAGNOSIS — J439 Emphysema, unspecified: Secondary | ICD-10-CM | POA: Diagnosis not present

## 2023-12-18 DIAGNOSIS — Z23 Encounter for immunization: Secondary | ICD-10-CM | POA: Diagnosis not present

## 2023-12-18 DIAGNOSIS — M542 Cervicalgia: Secondary | ICD-10-CM | POA: Insufficient documentation

## 2023-12-18 DIAGNOSIS — I120 Hypertensive chronic kidney disease with stage 5 chronic kidney disease or end stage renal disease: Secondary | ICD-10-CM | POA: Insufficient documentation

## 2023-12-18 DIAGNOSIS — H539 Unspecified visual disturbance: Secondary | ICD-10-CM

## 2023-12-18 DIAGNOSIS — Z5986 Financial insecurity: Secondary | ICD-10-CM | POA: Diagnosis not present

## 2023-12-18 DIAGNOSIS — D631 Anemia in chronic kidney disease: Secondary | ICD-10-CM | POA: Insufficient documentation

## 2023-12-18 DIAGNOSIS — Z833 Family history of diabetes mellitus: Secondary | ICD-10-CM | POA: Insufficient documentation

## 2023-12-18 DIAGNOSIS — E1122 Type 2 diabetes mellitus with diabetic chronic kidney disease: Secondary | ICD-10-CM | POA: Insufficient documentation

## 2023-12-18 DIAGNOSIS — Z5941 Food insecurity: Secondary | ICD-10-CM | POA: Insufficient documentation

## 2023-12-18 DIAGNOSIS — Z8249 Family history of ischemic heart disease and other diseases of the circulatory system: Secondary | ICD-10-CM | POA: Diagnosis not present

## 2023-12-18 DIAGNOSIS — E1140 Type 2 diabetes mellitus with diabetic neuropathy, unspecified: Secondary | ICD-10-CM | POA: Diagnosis not present

## 2023-12-18 DIAGNOSIS — N186 End stage renal disease: Secondary | ICD-10-CM | POA: Diagnosis not present

## 2023-12-18 DIAGNOSIS — R252 Cramp and spasm: Secondary | ICD-10-CM | POA: Diagnosis not present

## 2023-12-18 LAB — POCT GLYCOSYLATED HEMOGLOBIN (HGB A1C): HbA1c, POC (controlled diabetic range): 6.4 % (ref 0.0–7.0)

## 2023-12-18 MED ORDER — ACCU-CHEK GUIDE W/DEVICE KIT: PACK | 0 refills | Status: AC

## 2023-12-18 MED ORDER — ACCU-CHEK GUIDE TEST VI STRP: ORAL_STRIP | 12 refills | Status: AC

## 2023-12-18 MED ORDER — CYCLOBENZAPRINE HCL 5 MG PO TABS
5.0000 mg | ORAL_TABLET | Freq: Every day | ORAL | 1 refills | Status: DC | PRN
  Filled 2023-12-18: qty 30, 30d supply, fill #0

## 2023-12-18 MED ORDER — ACCU-CHEK SOFTCLIX LANCETS MISC: 2 refills | Status: AC

## 2023-12-18 NOTE — Progress Notes (Signed)
 Subjective:  Patient ID: [name] Crisp Sr., male    DOB: [birth year]  Age: 63 y.o. MRN: [id number]  CC: Medical Management of Chronic Issues (Neck pain)     Discussed the use of AI scribe software for clinical note transcription with the patient, who gave verbal consent to proceed.  History of Present Illness [name] Rathbun Sr. is a 63 year old male with a history significant for type 2 diabetes mellitus (diet-controlled), Seizures, ESRD (on HD T, Th, Sat) Fibrillary glomerulonephritis, alcohol  abuse, hypertension, Nicotine  dependence (1 ppd down from 2-3 ppd since age 48) coronary artery calcifications, ACDF of C4,5 and C5,6 on 09/14/17  By Dr Onetha secondary to Myelopathy, chronic right parietal lobe infarct  who presents for follow-up of blood pressure management and dialysis-related symptoms.  Blood pressure management has improved during dialysis sessions. Hypotensive episodes have decreased since stopping blood pressure medication on dialysis days. Amlodipine  was decreased to 5 mg at his last visit however his nephrologist further decreased this to 2.5 mg, and he plans to refill carvedilol  soon. No lightheadedness or recent falls.  Diarrhea has improved with dicyclomine , but new calcium  medications occasionally cause diarrhea. Fluid intake is managed carefully due to anuria, contributing to dehydration during dialysis.  Muscle cramps occur post-dialysis, likely due to fluid removal. Dietary adjustments include consuming half a banana, with previous potassium levels reaching 7.8 mmol/L. Urination is infrequent, with occasional dribbling.  Diabetes is well-controlled with an A1c of 6.4%. He takes Lyrica  for neuropathy, trazodone , and hydroxyzine  for sleep, and Lokelma  every other day for potassium management. He continues to experience abnormal vision despite being referred to ophthalmology in 07/2023.  His chart reveals the ophthalmology team were unsuccessful in reaching him to schedule an  appointment.   Past Medical History:  Diagnosis Date   Acute renal failure superimposed on stage 2 chronic kidney disease (HCC) 06/12/2018   Alcohol  abuse 10/18/2017   Allergy, unspecified, initial encounter 03/26/2021   Anaphylactic shock, unspecified, initial encounter 03/26/2021   Anemia in chronic kidney disease 03/26/2021   Anxiety and depression 10/06/2017   Arthritis    Atherosclerotic heart disease of native coronary artery without angina pectoris 03/26/2021   Bilateral lower extremity edema 06/11/2018   Bradycardia with 41-50 beats per minute 05/05/2021   CAD (coronary artery disease) 10/06/2017   CHF (congestive heart failure) (HCC)    CKD (chronic kidney disease) 07/20/2018   CKD (chronic kidney disease) stage 5, GFR less than 15 ml/min (HCC)    dialysis T/Th/Sa   Complication of vascular dialysis catheter 03/26/2021   COPD (chronic obstructive pulmonary disease) (HCC) 09/29/2017   Coronary artery calcification seen on CT scan 10/09/2017   Cough    Diarrhea, unspecified 03/26/2021   Disorder of phosphorus metabolism, unspecified 07/28/2021   Dyspnea    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   Edema 07/20/2018   ESRD (end stage renal disease) (HCC)    TTHSAT  Geronimo Car   Fever, unspecified 03/26/2021   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   Heart murmur    Hyperkalemia 06/12/2018   Hypertension 09/29/2017   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   On Rituximab  for Fibrillary GN   Iron  deficiency anemia, unspecified 03/26/2021   Myelopathy (HCC) 09/14/2017   Neuropathy 10/18/2017   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 Tremors whebn he moves it.  Shortness of breath 03/26/2021   Tobacco abuse 09/29/2017   Type 2 diabetes mellitus (HCC) 10/17/2011   diet  controlled - per pt does not check CBG at home- insurance did not approve - 08/13/21   Type 2 diabetes mellitus with diabetic peripheral angiopathy without gangrene (HCC) 03/26/2021   Type 2 diabetes mellitus with other diabetic kidney complication (HCC) 03/26/2021    Past Surgical History:  Procedure Laterality Date   A/V FISTULAGRAM Right 12/06/2021   Procedure: A/V Fistulagram;  Surgeon: Sheree Penne Bruckner, MD;  Location: [facility] INVASIVE CV LAB;  Service: Cardiovascular;  Laterality: Right;   ANTERIOR CERVICAL DECOMP/DISCECTOMY FUSION N/A 09/14/2017   Procedure: ANTERIOR CERVICAL DECOMPRESSION/DISCECTOMY FUSIONCERVICAL 4- CERVICAL 5, CERVICAL 5- CERVICAL 6;  Surgeon: Onetha Kuba, MD;  Location: [facility] OR;  Service: Neurosurgery;  Laterality: N/A;  ANTERIOR CERVICAL DECOMPRESSION/DISCECTOMY FUSIONCERVICAL 4- CERVICAL 5, CERVICAL 5- CERVICAL 6   AV FISTULA PLACEMENT Left 03/05/2021   Procedure: LEFT UPPER EXTREMITY RADIOBASILIC  ARTERIOVENOUS (AV) FISTULA CREATION;  Surgeon: Magda Debby SAILOR, MD;  Location: [facility] OR;  Service: Vascular;  Laterality: Left;  PERIPHERAL NERVE BLOCK   AV FISTULA PLACEMENT Left 04/21/2021   Procedure: INSERTION OF  LEFT ARM ARTERIOVENOUS (AV) GORE-TEX GRAFT;  Surgeon: Magda Debby SAILOR, MD;  Location: [facility] OR;  Service: Vascular;  Laterality: Left;  PERIPHERAL NERVE BLOCK   AV FISTULA PLACEMENT Right 06/30/2021   Procedure: RIGHT ARM ARTERIOVENOUS (AV) FISTULA;  Surgeon: Magda Debby SAILOR, MD;  Location: [facility] OR;  Service: Vascular;  Laterality: Right;  PERIPHERAL NERVE BLOCK   BASCILIC VEIN TRANSPOSITION Right 08/16/2021   Procedure: RIGHT ARM SECOND STAGE BASILIC VEIN TRANSPOSITION;  Surgeon: Magda Debby SAILOR, MD;  Location: [facility] OR;  Service: Vascular;  Laterality: Right;  PERIPHERAL NERVE BLOCK, LOCAL GIVEN   IR FLUORO GUIDE CV LINE LEFT  03/25/2021   PERIPHERAL VASCULAR BALLOON ANGIOPLASTY Right 12/06/2021   Procedure: PERIPHERAL VASCULAR BALLOON ANGIOPLASTY;  Surgeon: Sheree Penne Bruckner, MD;  [facility] CV LAB;  Service: Cardiovascular;  Laterality: Right;   UPPER EXTREMITY VENOGRAPHY Bilateral 06/04/2021   Procedure: CENTRAL  AND UPPER VENOGRAPHY;  Surgeon: Magda Debby SAILOR, MD;  Location: [facility] INVASIVE CV LAB;  Service: Cardiovascular;  Laterality: Bilateral;    Family History  Problem Relation Age of Onset   Diabetes Mother    Scoliosis Mother    Kidney cancer Father    Lung cancer Father    Hypertension Brother    Colon cancer Neg Hx    Pancreatic cancer Neg Hx    Prostate cancer Neg Hx    Rectal cancer Neg Hx    Stomach cancer Neg Hx     Social History   Socioeconomic History   Marital status: Single    Spouse name: Not on file   Number of children: Not on file   Years of education: Not on file   Highest education level: Not on file  Occupational History   Not on file  Tobacco Use   Smoking status: Every Day    Current packs/day: 1.50    Average packs/day: 1.5 packs/day for 42.0 years (63.0 ttl pk-yrs)    Types: Cigarettes   Smokeless tobacco: Never   Tobacco comments:    1 pack a day   Vaping Use   Vaping status: Never Used  Substance and Sexual Activity   Alcohol  use: Not Currently    Alcohol /week: 10.0 standard drinks of alcohol     Types: 10 Cans of beer per week  Comment: quit September 2022   Drug use: Yes    Types: Marijuana    Comment: ocassionally   Sexual activity: Not on file  Other Topics Concern   Not on file  Social History Narrative   Not on file   Social Drivers of Health   Financial Resource Strain: High Risk (09/19/2023)   Overall Financial Resource Strain (CARDIA)    Difficulty of Paying Living Expenses: Hard  Food Insecurity: No Food Insecurity (09/19/2023)   Hunger Vital Sign    Worried About Running Out of Food in the Last Year: Never true    Ran Out of Food in the Last Year: Never true  Recent Concern: Food Insecurity - Food Insecurity Present (08/21/2023)   Hunger Vital Sign    Worried  About Running Out of Food in the Last Year: Never true    Ran Out of Food in the Last Year: Sometimes true  Transportation Needs: No Transportation Needs (09/19/2023)   PRAPARE - Administrator, Civil Service (Medical): No    Lack of Transportation (Non-Medical): No  Physical Activity: Patient Declined (09/19/2023)   Exercise Vital Sign    Days of Exercise per Week: Patient declined    Minutes of Exercise per Session: Patient declined  Stress: No Stress Concern Present (09/19/2023)   [employer] of Occupational Health - Occupational Stress Questionnaire    Feeling of Stress : Not at all  Social Connections: Socially Isolated (09/19/2023)   Social Connection and Isolation Panel    Frequency of Communication with Friends and Family: Twice a week    Frequency of Social Gatherings with Friends and Family: Twice a week    Attends Religious Services: Never    Database administrator or Organizations: No    Attends Engineer, structural: Never    Marital Status: Divorced    No Known Allergies  Outpatient Medications Prior to Visit  Medication Sig Dispense Refill   albuterol  (VENTOLIN  HFA) 108 (90 Base) MCG/ACT inhaler Inhale 2 puffs into the lungs every 6 (six) hours as needed for COPD. 18 g 0   amLODipine  (NORVASC ) 2.5 MG tablet Take 1 tablet (2.5 mg total) by mouth Nightly. 90 tablet 3   AURYXIA  1 GM 210 MG(Fe) tablet Take 420 mg by mouth 2 (two) times daily before a meal.     carvedilol  (COREG ) 6.25 MG tablet Take 1 tablet (6.25 mg total) by mouth 2 (two) times daily. 180 tablet 1   citalopram  (CELEXA ) 40 MG tablet Take 1 tablet (40 mg total) by mouth daily.Must have office visit for refills 90 tablet 1   dicyclomine  (BENTYL ) 10 MG capsule Take 1 capsule (10 mg total) by mouth 3 (three) times daily as needed for spasms (Diarrhea). 90 capsule 1   ezetimibe  (ZETIA ) 10 MG tablet Take 1 tablet (10 mg total) by mouth daily. 30 tablet 0   fluticasone -salmeterol (ADVAIR )  100-50 MCG/ACT AEPB Inhale 1 puff into the lungs 2 (two) times daily. 60 each 6   glucose blood (ACCU-CHEK GUIDE) test strip Use as instructed 3 times daily. Dx E11.22 100 each 12   hydrOXYzine  (ATARAX ) 25 MG tablet Take 1 tablet (25 mg total) by mouth 3 (three) times daily as needed for anxiety.Must have office visit for refills. 90 tablet 1   levETIRAcetam  (KEPPRA ) 500 MG tablet Take 1 tablet (500 mg total) by mouth 2 (two) times daily. 180 tablet 1   nicotine  (NICODERM CQ  - DOSED IN MG/24 HOURS) 21 mg/24hr patch  Place 1 patch (21 mg total) onto the skin daily. 28 patch 0   nicotine  (NICODERM CQ ) 14 mg/24hr patch Place 1 patch (14 mg total) onto the skin daily. 28 patch 0   pregabalin  (LYRICA ) 25 MG capsule Take 1 capsule (25 mg total) by mouth at bedtime. 90 capsule 1   QUEtiapine  (SEROQUEL ) 25 MG tablet Take 1 tablet (25 mg total) by mouth at bedtime.     rosuvastatin  (CRESTOR ) 10 MG tablet Take 1 tablet (10 mg total) by mouth daily. 90 tablet 1   sodium zirconium cyclosilicate  (LOKELMA ) 10 g PACK packet Take 10 gram by mouth twice a week as directed on non-dialysis days (Wed and Sunday) 9 packet 11   sodium zirconium cyclosilicate  (LOKELMA ) 10 g PACK packet Mix 10 g (1 packet) according to packet instructions and take by mouth every Wednesday and Saturday. 10 each 0   traZODone  (DESYREL ) 50 MG tablet Take 1 tablet (50 mg total) by mouth at bedtime. 90 tablet 1   Accu-Chek Softclix Lancets lancets Use as instructed tid before meals. Dx E11.22 100 each 2   amLODipine  (NORVASC ) 5 MG tablet Take 1 tablet (5 mg total) by mouth at bedtime. 90 tablet 1   Blood Glucose Monitoring Suppl (ACCU-CHEK GUIDE) w/Device KIT Use as directed 3 times daily. 1 kit 0   sodium zirconium cyclosilicate  (LOKELMA ) 10 g PACK packet Take 1 packet by mouth as directed four days a week (on non-dialysis days) and as directed. (Patient not taking: Reported on 12/18/2023) 50 packet 3   sodium zirconium cyclosilicate  (LOKELMA ) 10  g PACK packet Take 10 g (1 packet total) by mouth daily AS DIRECTED. (Patient not taking: Reported on 12/18/2023) 90 packet 3   sodium zirconium cyclosilicate  (LOKELMA ) 10 g PACK packet Mix 1 packet (10g) according to package instructions and take by mouth once daily. (Patient not taking: Reported on 12/18/2023) 90 packet 3   sodium zirconium cyclosilicate  (LOKELMA ) 10 g PACK packet Take 1 packet by mouth as directed four days a week (on M-W-F-SUN) and as directed. (Patient not taking: Reported on 12/18/2023) 48 each 0   No facility-administered medications prior to visit.     ROS Review of Systems  Constitutional:  Negative for activity change and appetite change.  HENT:  Negative for sinus pressure and sore throat.   Eyes:  Positive for visual disturbance.  Respiratory:  Negative for chest tightness, shortness of breath and wheezing.   Cardiovascular:  Negative for chest pain and palpitations.  Gastrointestinal:  Negative for abdominal distention, abdominal pain and constipation.  Genitourinary: Negative.   Musculoskeletal:        See HPI  Psychiatric/Behavioral:  Negative for behavioral problems and dysphoric mood.     Objective:  BP 123/70   Pulse 72   Ht 6' 2 (1.88 m)   Wt 199 lb 6.4 oz (90.4 kg)   SpO2 97%   BMI 25.60 kg/m      12/18/2023    9:51 AM 09/19/2023    1:43 PM 08/21/2023    9:41 AM  BP/Weight  Systolic BP 123  870  Diastolic BP 70  73  Wt. (Lbs) 199.4 197.31 193  BMI 25.6 kg/m2 25.33 kg/m2 24.78 kg/m2      Physical Exam Constitutional:      Appearance: He is well-developed.   Cardiovascular:     Rate and Rhythm: Normal rate.     Heart sounds: Normal heart sounds. No murmur heard. Pulmonary:     Effort: Pulmonary  effort is normal.     Breath sounds: Normal breath sounds. No wheezing or rales.  Chest:     Chest wall: No tenderness.  Abdominal:     General: Bowel sounds are normal. There is no distension.     Palpations: Abdomen is soft. There is no  mass.     Tenderness: There is no abdominal tenderness.   Musculoskeletal:        General: Normal range of motion.     Right lower leg: No edema.     Left lower leg: No edema.   Neurological:     Mental Status: He is alert and oriented to person, place, and time.   Psychiatric:        Mood and Affect: Mood normal.        Latest Ref Rng & Units 05/16/2023    3:20 PM 05/12/2023    5:50 AM 05/10/2023    4:57 AM  CMP  Glucose 70 - 99 mg/dL 828  892  888   BUN 8 - 23 mg/dL 74  32  28   Creatinine 0.61 - 1.24 mg/dL 82.71  89.18  89.02   Sodium 135 - 145 mmol/L 133  135  136   Potassium 3.5 - 5.1 mmol/L 4.8  4.5  4.6   Chloride 98 - 111 mmol/L 85  92  93   CO2 22 - 32 mmol/L 30  25  27    Calcium  8.9 - 10.3 mg/dL 9.8  9.7  9.3     Lipid Panel     Component Value Date/Time   CHOL 134 08/15/2022 0936   TRIG 84 08/15/2022 0936   HDL 49 08/15/2022 0936   CHOLHDL 3.6 02/17/2021 0924   LDLCALC 69 08/15/2022 0936    CBC    Component Value Date/Time   WBC 11.6 (H) 05/16/2023 1520   RBC 3.72 (L) 05/16/2023 1520   HGB 11.0 (L) 05/16/2023 1520   HCT 32.9 (L) 05/16/2023 1520   PLT 327 05/16/2023 1520   MCV 88.4 05/16/2023 1520   MCH 29.6 05/16/2023 1520   MCHC 33.4 05/16/2023 1520   RDW 13.9 05/16/2023 1520   LYMPHSABS 2.1 05/16/2023 1520   MONOABS 1.0 05/16/2023 1520   EOSABS 0.5 05/16/2023 1520   BASOSABS 0.1 05/16/2023 1520    Lab Results  Component Value Date   HGBA1C 6.4 12/18/2023       Assessment & Plan Lung Nodule New lung nodule identified on CT scan for lung cancer screening in 09/2023, requires monitoring for malignancy. Emphysema present. - Refer to pulmonologist for evaluation and monitoring. - Schedule follow-up CT scan in six months -due in 03/2024  Hypertension in stage V CKD secondary to type 2 diabetes mellitus Falls which he complained of previously have resolved ever since amlodipine  dose was decreased Hypertension well-controlled with  amlodipine  2.5 mg. Blood pressure 123/70 mmHg, no symptoms. - Ensure amlodipine  taken at 2.5 mg as per nephrologist's recommendation. -Continue all other antihypertensive  Diabetes Mellitus Type 2 Diabetes well-controlled with A1c of 6.4%. Diet controlled  Muscle Cramps Muscle cramps post-dialysis likely due to fluid removal. Dietary adjustments made, cramps persist. - Prescribe Flexeril  5 mg on Tuesday, Thursday, and Saturday after dialysis. - Consider magnesium supplementation.  Vision Problems Vision issues persist, he was referred to ophthalmology in 07/2023 but is yet to make an appointment. Encouraged ophthalmology appointment on non-dialysis day. - Provide contact information for [facility] to schedule an appointment on a non-dialysis day.  General Health  Maintenance/need for Shingles vaccine Due for second dose of shingles vaccine. Acknowledged potential shoulder pain post-vaccination. - Administer second dose of shingles vaccine.    Meds ordered this encounter  Medications   cyclobenzaprine  (FLEXERIL ) 5 MG tablet    Sig: Take 1 tablet (5 mg total) by mouth daily as needed for muscle spasms. After dialysis on Tuesday, Thursdays and Saturdays    Dispense:  30 tablet    Refill:  1   glucose blood (ACCU-CHEK GUIDE TEST) test strip    Sig: Use as instructed 3 times daily. Dx E11.22    Dispense:  100 each    Refill:  12   Accu-Chek Softclix Lancets lancets    Sig: Use as instructed tid before meals. Dx E11.22    Dispense:  100 each    Refill:  2   Blood Glucose Monitoring Suppl (ACCU-CHEK GUIDE) w/Device KIT    Sig: Use as directed 3 times daily.    Dispense:  1 kit    Refill:  0    Follow-up: No follow-ups on file.       Corrina Sabin, MD, FAAFP. [facility] [place], [place] [phone]   12/18/2023, 10:58 AM

## 2023-12-18 NOTE — Patient Instructions (Signed)
 Please call Ophthalmology: [facility] ph # [phone] address [address]

## 2023-12-28 ENCOUNTER — Other Ambulatory Visit: Payer: Self-pay

## 2024-01-02 ENCOUNTER — Other Ambulatory Visit: Payer: Self-pay

## 2024-01-05 ENCOUNTER — Other Ambulatory Visit: Payer: Self-pay

## 2024-01-05 ENCOUNTER — Other Ambulatory Visit: Payer: Self-pay | Admitting: Family Medicine

## 2024-01-05 MED ORDER — EZETIMIBE 10 MG PO TABS
10.0000 mg | ORAL_TABLET | Freq: Every day | ORAL | 0 refills | Status: DC
  Filled 2024-01-05: qty 30, 30d supply, fill #0

## 2024-01-15 ENCOUNTER — Other Ambulatory Visit: Payer: Self-pay

## 2024-01-17 ENCOUNTER — Other Ambulatory Visit: Payer: Self-pay

## 2024-04-05 ENCOUNTER — Other Ambulatory Visit: Payer: Self-pay | Admitting: Family Medicine

## 2024-04-05 ENCOUNTER — Other Ambulatory Visit: Payer: Self-pay

## 2024-04-06 MED ORDER — TRAZODONE HCL 50 MG PO TABS
50.0000 mg | ORAL_TABLET | Freq: Every day | ORAL | 0 refills | Status: DC
  Filled 2024-04-06: qty 90, 90d supply, fill #0

## 2024-04-06 MED ORDER — PREGABALIN 25 MG PO CAPS
25.0000 mg | ORAL_CAPSULE | Freq: Every day | ORAL | 0 refills | Status: DC
  Filled 2024-04-06: qty 90, 90d supply, fill #0

## 2024-04-06 MED ORDER — DICYCLOMINE HCL 10 MG PO CAPS
10.0000 mg | ORAL_CAPSULE | Freq: Three times a day (TID) | ORAL | 0 refills | Status: DC | PRN
  Filled 2024-04-06: qty 90, 30d supply, fill #0

## 2024-04-06 MED ORDER — LEVETIRACETAM 500 MG PO TABS
500.0000 mg | ORAL_TABLET | Freq: Two times a day (BID) | ORAL | 0 refills | Status: DC
  Filled 2024-04-06: qty 180, 90d supply, fill #0

## 2024-04-06 MED ORDER — ALBUTEROL SULFATE HFA 108 (90 BASE) MCG/ACT IN AERS
2.0000 | INHALATION_SPRAY | Freq: Four times a day (QID) | RESPIRATORY_TRACT | 0 refills | Status: DC | PRN
  Filled 2024-04-06: qty 18, 25d supply, fill #0

## 2024-04-08 ENCOUNTER — Other Ambulatory Visit: Payer: Self-pay

## 2024-04-10 ENCOUNTER — Other Ambulatory Visit: Payer: Self-pay

## 2024-04-11 ENCOUNTER — Other Ambulatory Visit: Payer: Self-pay

## 2024-05-17 ENCOUNTER — Ambulatory Visit (HOSPITAL_COMMUNITY)
Admission: RE | Admit: 2024-05-17 | Discharge: 2024-05-17 | Disposition: A | Discharge status: Home / Self Care | Attending: Vascular Surgery | Admitting: Vascular Surgery

## 2024-05-17 ENCOUNTER — Encounter (HOSPITAL_COMMUNITY)
Admission: RE | Disposition: A | Discharge status: Home / Self Care | Payer: Self-pay | Source: Home / Self Care | Attending: Vascular Surgery

## 2024-05-17 ENCOUNTER — Encounter (HOSPITAL_COMMUNITY): Payer: Self-pay | Admitting: Vascular Surgery

## 2024-05-17 ENCOUNTER — Other Ambulatory Visit: Payer: Self-pay

## 2024-05-17 DIAGNOSIS — F17210 Nicotine dependence, cigarettes, uncomplicated: Secondary | ICD-10-CM | POA: Insufficient documentation

## 2024-05-17 DIAGNOSIS — I509 Heart failure, unspecified: Secondary | ICD-10-CM | POA: Insufficient documentation

## 2024-05-17 DIAGNOSIS — E1122 Type 2 diabetes mellitus with diabetic chronic kidney disease: Secondary | ICD-10-CM | POA: Insufficient documentation

## 2024-05-17 DIAGNOSIS — I132 Hypertensive heart and chronic kidney disease with heart failure and with stage 5 chronic kidney disease, or end stage renal disease: Secondary | ICD-10-CM | POA: Diagnosis not present

## 2024-05-17 DIAGNOSIS — N186 End stage renal disease: Secondary | ICD-10-CM | POA: Diagnosis not present

## 2024-05-17 DIAGNOSIS — T82858A Stenosis of vascular prosthetic devices, implants and grafts, initial encounter: Secondary | ICD-10-CM | POA: Diagnosis present

## 2024-05-17 DIAGNOSIS — Z992 Dependence on renal dialysis: Secondary | ICD-10-CM | POA: Insufficient documentation

## 2024-05-17 DIAGNOSIS — Z79899 Other long term (current) drug therapy: Secondary | ICD-10-CM | POA: Diagnosis not present

## 2024-05-17 DIAGNOSIS — Y832 Surgical operation with anastomosis, bypass or graft as the cause of abnormal reaction of the patient, or of later complication, without mention of misadventure at the time of the procedure: Secondary | ICD-10-CM | POA: Diagnosis not present

## 2024-05-17 HISTORY — PX: VENOUS ANGIOPLASTY: CATH118376

## 2024-05-17 HISTORY — PX: A/V FISTULAGRAM: CATH118298

## 2024-05-17 LAB — GLUCOSE, CAPILLARY: Glucose-Capillary: 142 mg/dL — ABNORMAL HIGH (ref 70–99)

## 2024-05-17 SURGERY — A/V FISTULAGRAM
Anesthesia: LOCAL | Site: Arm Upper | Laterality: Right

## 2024-05-17 MED ORDER — LIDOCAINE HCL (PF) 1 % IJ SOLN
INTRAMUSCULAR | Status: AC
  Filled 2024-05-17: qty 30

## 2024-05-17 MED ORDER — LIDOCAINE HCL (PF) 1 % IJ SOLN
INTRAMUSCULAR | Status: DC | PRN
Start: 2024-05-17 — End: 2024-05-17
  Administered 2024-05-17: 5 mL

## 2024-05-17 MED ORDER — HEPARIN (PORCINE) IN NACL 1000-0.9 UT/500ML-% IV SOLN
INTRAVENOUS | Status: DC | PRN
  Administered 2024-05-17: 500 mL

## 2024-05-17 MED ORDER — IODIXANOL 320 MG/ML IV SOLN
INTRAVENOUS | Status: DC | PRN
  Administered 2024-05-17: 35 mL via INTRAVENOUS

## 2024-05-17 SURGICAL SUPPLY — 10 items
BALLOON MUSTANG 8X80X75 (BALLOONS) IMPLANT
BALLOON MUSTANG 9X80X75 (BALLOONS) IMPLANT
DEVICE INFLATION ENCORE 26 (MISCELLANEOUS) IMPLANT
KIT MICROPUNCTURE NIT STIFF (SHEATH) IMPLANT
SHEATH PINNACLE R/O II 6F 4CM (SHEATH) IMPLANT
SHEATH PROBE COVER 6X72 (BAG) IMPLANT
STOPCOCK MORSE 400PSI 3WAY (MISCELLANEOUS) IMPLANT
TRAY PV CATH (CUSTOM PROCEDURE TRAY) ×2 IMPLANT
TUBING CIL FLEX 10 FLL-RA (TUBING) IMPLANT
WIRE BENTSON .035X145CM (WIRE) IMPLANT

## 2024-05-17 NOTE — Op Note (Signed)
    Patient name: [name] Minks Sr. MRN: [id number] DOB: [birth year] Sex: male  05/17/2024 Pre-operative Diagnosis: Malfunction of right arm brachiobasilic AV fistula in the setting of ESRD Post-operative diagnosis:  Same Surgeon:  Lonni DOROTHA Gaskins, MD Procedure Performed: 1.  Ultrasound-guided access right brachiobasilic AV fistula 2.  Right upper extremity fistulogram including central venogram 3.  Peripheral angioplasty right upper arm basilic vein fistula (8 mm and 9 mm x 80 mm Mustang)  Indications: 63 year old male using a right arm basilic vein fistula in the setting of end-stage renal disease.  He presents for right arm fistulogram possible intervention after risks benefits discussed.  Findings:   Ultrasound-guided access right basilic vein fistula.  No evidence of central venous stenosis.  There was an 80% stenosis in the mid upper arm between 2 aneurysmal segments.  Ultimately this was treated with 8 mm and then 9 mm Mustang with excellent results.  Great thrill at completion.   Procedure:  The patient was identified in the holding area and taken to [facility] lab.  Patient was placed on the table in supine position.  The right arm was prepped draped standard sterile fashion.  Timeout performed.  I injected 1% lidocaine  in the skin over the fistula without epinephrine .  Then accessed this under ultrasound guidance with micro access needle placed a microwire and micro sheath.  Right extremity fistulogram was obtained including central venogram.  Elected for intervention.  Bentson wire was used exchanged for a short 6 French sheath.  The stenosis identified was treated initially with an 8 mm x 80 mm Mustang to nominal pressure for 2 minutes.  There was still some residual stenosis or upsized to a 9 mm x 80 mm thing to nominal pressure for 2 minutes.  Great results.  Great thrill.  Wires and catheters were removed.  Pursestring was tied around the sheath and this was removed.  Taken holding in  stable condition.     Lonni DOROTHA Gaskins, MD [facility] Office: [phone]

## 2024-05-17 NOTE — H&P (Signed)
 H&P     MRN #:  [id number]  History of Present Illness: This is a 63 y.o. male with end-stage renal disease that presents for right arm fistulogram due to malfunction of right basilic vein fistula.  Patient unclear how long the fistula has had issues but states he was told in dialysis he needs to be evaluated.  Past Medical History:  Diagnosis Date   Acute renal failure superimposed on stage 2 chronic kidney disease 06/12/2018   Alcohol  abuse 10/18/2017   Allergy, unspecified, initial encounter 03/26/2021   Anaphylactic shock, unspecified, initial encounter 03/26/2021   Anemia in chronic kidney disease 03/26/2021   Anxiety and depression 10/06/2017   Arthritis    Atherosclerotic heart disease of native coronary artery without angina pectoris 03/26/2021   Bilateral lower extremity edema 06/11/2018   Bradycardia with 41-50 beats per minute 05/05/2021   CAD (coronary artery disease) 10/06/2017   CHF (congestive heart failure) (HCC)    CKD (chronic kidney disease) 07/20/2018   CKD (chronic kidney disease) stage 5, GFR less than 15 ml/min (HCC)    dialysis T/Th/Sa   Complication of vascular dialysis catheter 03/26/2021   COPD (chronic obstructive pulmonary disease) (HCC) 09/29/2017   Coronary artery calcification seen on CT scan 10/09/2017   Cough    Diarrhea, unspecified 03/26/2021   Disorder of phosphorus metabolism, unspecified 07/28/2021   Dyspnea    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   Edema 07/20/2018   ESRD (end stage renal disease) (HCC)    TTHSAT  Geronimo Car   Fever, unspecified 03/26/2021   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 Tremors whebn he moves it.   Shortness of breath 03/26/2021   Tobacco abuse 09/29/2017   Type 2 diabetes mellitus (HCC) 10/17/2011   diet controlled - per pt does not check CBG at home- insurance did not approve - 08/13/21   Type 2 diabetes mellitus with diabetic peripheral angiopathy without gangrene (HCC) 03/26/2021   Type 2 diabetes mellitus with other diabetic kidney complication (HCC) 03/26/2021    Past Surgical History:  Procedure Laterality Date   A/V FISTULAGRAM Right 12/06/2021   Procedure: A/V Fistulagram;  Surgeon: Sheree Penne Bruckner, MD;  Location: [facility] INVASIVE CV LAB;  Service: Cardiovascular;  Laterality: Right;   ANTERIOR CERVICAL DECOMP/DISCECTOMY FUSION N/A 09/14/2017   Procedure: ANTERIOR CERVICAL DECOMPRESSION/DISCECTOMY FUSIONCERVICAL 4- CERVICAL 5, CERVICAL 5- CERVICAL 6;  Surgeon: Onetha Kuba, MD;  Location: [facility] OR;  Service: Neurosurgery;  Laterality: N/A;  ANTERIOR CERVICAL DECOMPRESSION/DISCECTOMY FUSIONCERVICAL 4- CERVICAL 5, CERVICAL 5- CERVICAL 6   AV FISTULA PLACEMENT Left 03/05/2021   Procedure: LEFT UPPER EXTREMITY RADIOBASILIC  ARTERIOVENOUS (AV) FISTULA CREATION;  Surgeon: Magda Debby SAILOR, MD;  Location: [facility] OR;  Service: Vascular;  Laterality: Left;  PERIPHERAL NERVE BLOCK   AV FISTULA PLACEMENT Left 04/21/2021   Procedure: INSERTION OF  LEFT ARM ARTERIOVENOUS (AV) GORE-TEX GRAFT;  Surgeon: Magda Debby SAILOR, MD;  Location: [facility] OR;  Service: Vascular;  Laterality: Left;  PERIPHERAL NERVE BLOCK   AV FISTULA PLACEMENT Right 06/30/2021   Procedure: RIGHT ARM  ARTERIOVENOUS (AV) FISTULA;  Surgeon: Magda Debby SAILOR, MD;  Location: [facility] OR;  Service: Vascular;  Laterality: Right;  PERIPHERAL NERVE BLOCK   BASCILIC VEIN TRANSPOSITION Right 08/16/2021   Procedure: RIGHT ARM SECOND STAGE BASILIC VEIN TRANSPOSITION;  Surgeon: Magda Debby SAILOR,  MD;  Location: [facility] OR;  Service: Vascular;  Laterality: Right;  PERIPHERAL NERVE BLOCK, LOCAL GIVEN   IR FLUORO GUIDE CV LINE LEFT  03/25/2021   PERIPHERAL VASCULAR BALLOON ANGIOPLASTY Right 12/06/2021   Procedure: PERIPHERAL VASCULAR BALLOON ANGIOPLASTY;  Surgeon: Sheree Penne Bruckner, MD;  Location: [facility] INVASIVE CV LAB;  Service: Cardiovascular;  Laterality: Right;   UPPER EXTREMITY VENOGRAPHY Bilateral 06/04/2021   Procedure: CENTRAL  AND UPPER VENOGRAPHY;  Surgeon: Magda Debby SAILOR, MD;  Location: [facility] INVASIVE CV LAB;  Service: Cardiovascular;  Laterality: Bilateral;    No Known Allergies  Prior to Admission medications   Medication Sig Start Date End Date Taking? Authorizing Provider  Accu-Chek Softclix Lancets lancets Use as instructed tid before meals. Dx E11.22 12/18/23   Newlin, Enobong, MD  albuterol  (VENTOLIN  HFA) 108 (90 Base) MCG/ACT inhaler Inhale 2 puffs into the lungs every 6 (six) hours as needed for COPD. 04/06/24   Vicci Barnie NOVAK, MD  amLODipine  (NORVASC ) 2.5 MG tablet Take 1 tablet (2.5 mg total) by mouth Nightly. 09/14/23   Jerrye Katheryn BROCKS, MD  AURYXIA  1 GM 210 MG(Fe) tablet Take 420 mg by mouth 2 (two) times daily before a meal. 04/15/21   [provider]  Blood Glucose Monitoring Suppl (ACCU-CHEK GUIDE) w/Device KIT Use as directed 3 times daily. 12/18/23   Newlin, Enobong, MD  carvedilol  (COREG ) 6.25 MG tablet Take 1 tablet (6.25 mg total) by mouth 2 (two) times daily. 08/21/23   Newlin, Enobong, MD  citalopram  (CELEXA ) 40 MG tablet Take 1 tablet (40 mg total) by mouth daily.Must have office visit for refills 08/21/23   [name], MD  cyclobenzaprine  (FLEXERIL ) 5 MG tablet Take 1 tablet (5 mg total) by mouth daily as needed for muscle spasms. After dialysis on Tuesday, Thursdays and Saturdays 12/18/23   [name], MD  dicyclomine  (BENTYL ) 10 MG capsule Take 1 capsule (10 mg total) by mouth 3 (three) times daily as needed for spasms (Diarrhea). 04/06/24    Vicci Barnie NOVAK, MD  ezetimibe  (ZETIA ) 10 MG tablet Take 1 tablet (10 mg total) by mouth daily. 01/05/24   Newlin, Enobong, MD  fluticasone -salmeterol (ADVAIR ) 100-50 MCG/ACT AEPB Inhale 1 puff into the lungs 2 (two) times daily. 08/21/23   Newlin, Enobong, MD  glucose blood (ACCU-CHEK GUIDE TEST) test strip Use as instructed 3 times daily. Dx E11.22 12/18/23   Newlin, Enobong, MD  glucose blood (ACCU-CHEK GUIDE) test strip Use as instructed 3 times daily. Dx E11.22 08/15/22   Newlin, Enobong, MD  hydrOXYzine  (ATARAX ) 25 MG tablet Take 1 tablet (25 mg total) by mouth 3 (three) times daily as needed for anxiety.Must have office visit for refills. 08/21/23   Newlin, Enobong, MD  levETIRAcetam  (KEPPRA ) 500 MG tablet Take 1 tablet (500 mg total) by mouth 2 (two) times daily. 04/06/24   Vicci Barnie NOVAK, MD  nicotine  (NICODERM CQ  - DOSED IN MG/24 HOURS) 21 mg/24hr patch Place 1 patch (21 mg total) onto the skin daily. 05/16/23   Odell Celinda Balo, MD  nicotine  (NICODERM CQ ) 14 mg/24hr patch Place 1 patch (14 mg total) onto the skin daily. 08/15/22   Newlin, Enobong, MD  pregabalin  (LYRICA ) 25 MG capsule Take 1 capsule (25 mg total)  by mouth at bedtime. 04/06/24   Vicci Barnie NOVAK, MD  QUEtiapine  (SEROQUEL ) 25 MG tablet Take 1 tablet (25 mg total) by mouth at bedtime. 05/16/23   Odell Celinda Balo, MD  rosuvastatin  (CRESTOR ) 10 MG tablet Take 1 tablet (10 mg total) by mouth daily. 08/21/23   Newlin, Enobong, MD  sodium zirconium cyclosilicate  (LOKELMA ) 10 g PACK packet Take 10 gram by mouth twice a week as directed on non-dialysis days (Wed and Sunday) 08/23/22     sodium zirconium cyclosilicate  (LOKELMA ) 10 g PACK packet Mix 10 g (1 packet) according to packet instructions and take by mouth every Wednesday and Saturday. 06/07/23     sodium zirconium cyclosilicate  (LOKELMA ) 10 g PACK packet Take 1 packet by mouth as directed four days a week (on non-dialysis days) and as directed. Patient not taking:  Reported on 12/18/2023 07/11/23     sodium zirconium cyclosilicate  (LOKELMA ) 10 g PACK packet Take 10 g (1 packet total) by mouth daily AS DIRECTED. Patient not taking: Reported on 12/18/2023 09/28/23     sodium zirconium cyclosilicate  (LOKELMA ) 10 g PACK packet Mix 1 packet (10g) according to package instructions and take by mouth once daily. Patient not taking: Reported on 12/18/2023 11/07/23     sodium zirconium cyclosilicate  (LOKELMA ) 10 g PACK packet Take 1 packet by mouth as directed four days a week (on M-W-F-SUN) and as directed. Patient not taking: Reported on 12/18/2023 09/25/23     traZODone  (DESYREL ) 50 MG tablet Take 1 tablet (50 mg total) by mouth at bedtime. 04/06/24   Vicci Barnie NOVAK, MD    Social History   Socioeconomic History   Marital status: Single    Spouse name: Not on file   Number of children: Not on file   Years of education: Not on file   Highest education level: Not on file  Occupational History   Not on file  Tobacco Use   Smoking status: Every Day    Current packs/day: 1.50    Average packs/day: 1.5 packs/day for 42.0 years (63.0 ttl pk-yrs)    Types: Cigarettes   Smokeless tobacco: Never   Tobacco comments:    1 pack a day   Vaping Use   Vaping status: Never Used  Substance and Sexual Activity   Alcohol  use: Not Currently    Alcohol /week: 10.0 standard drinks of alcohol     Types: 10 Cans of beer per week    Comment: quit September 2022   Drug use: Yes    Types: Marijuana    Comment: ocassionally   Sexual activity: Not on file  Other Topics Concern   Not on file  Social History Narrative   Not on file   Social Drivers of Health   Financial Resource Strain: High Risk (09/19/2023)   Overall Financial Resource Strain (CARDIA)    Difficulty of Paying Living Expenses: Hard  Food Insecurity: No Food Insecurity (09/19/2023)   Hunger Vital Sign    Worried About Running Out of Food in the Last Year: Never true    Ran Out of Food in the Last Year: Never  true  Recent Concern: Food Insecurity - Food Insecurity Present (08/21/2023)   Hunger Vital Sign    Worried About Running Out of Food in the Last Year: Never true    Ran Out of Food in the Last Year: Sometimes true  Transportation Needs: No Transportation Needs (09/19/2023)   PRAPARE - Transportation    Lack of Transportation (Medical): No    Lack  of Transportation (Non-Medical): No  Physical Activity: Patient Declined (09/19/2023)   Exercise Vital Sign    Days of Exercise per Week: Patient declined    Minutes of Exercise per Session: Patient declined  Stress: No Stress Concern Present (09/19/2023)   [employer] of Occupational Health - Occupational Stress Questionnaire    Feeling of Stress : Not at all  Social Connections: Socially Isolated (09/19/2023)   Social Connection and Isolation Panel    Frequency of Communication with Friends and Family: Twice a week    Frequency of Social Gatherings with Friends and Family: Twice a week    Attends Religious Services: Never    Database Administrator or Organizations: No    Attends Banker Meetings: Never    Marital Status: Divorced  Catering Manager Violence: Not At Risk (09/19/2023)   Humiliation, Afraid, Rape, and Kick questionnaire    Fear of Current or Ex-Partner: No    Emotionally Abused: No    Physically Abused: No    Sexually Abused: No     Family History  Problem Relation Age of Onset   Diabetes Mother    Scoliosis Mother    Kidney cancer Father    Lung cancer Father    Hypertension Brother    Colon cancer Neg Hx    Pancreatic cancer Neg Hx    Prostate cancer Neg Hx    Rectal cancer Neg Hx    Stomach cancer Neg Hx     ROS: [x]  Positive   [ ]  Negative   [ ]  All sytems reviewed and are negative  Cardiovascular: []  chest pain/pressure []  palpitations []  SOB lying flat []  DOE []  pain in legs while walking []  pain in legs at rest []  pain in legs at night []  non-healing ulcers []  hx of DVT []   swelling in legs  Pulmonary: []  productive cough []  asthma/wheezing []  home O2  Neurologic: []  weakness in []  arms []  legs []  numbness in []  arms []  legs []  hx of CVA []  mini stroke [] difficulty speaking or slurred speech []  temporary loss of vision in one eye []  dizziness  Hematologic: []  hx of cancer []  bleeding problems []  problems with blood clotting easily  Endocrine:   []  diabetes []  thyroid  disease  GI []  vomiting blood []  blood in stool  GU: []  CKD/renal failure []  HD--[]  M/W/F or []  T/T/S []  burning with urination []  blood in urine  Psychiatric: []  anxiety []  depression  Musculoskeletal: []  arthritis []  joint pain  Integumentary: []  rashes []  ulcers  Constitutional: []  fever []  chills   Physical Examination  Vitals:   05/17/24 0722 05/17/24 0723  BP: (!) 173/82   Pulse: 85   Resp: 18   SpO2: (!) 88% 94%   There is no height or weight on file to calculate BMI.  General:  WDWN in NAD Gait: Not observed HENT: WNL, normocephalic Pulmonary: normal non-labored breathing Cardiac: regular, without  Murmurs, rubs or gallops Vascular Exam/Pulses: Right arm basilic vein fistula with great thrill  CBC    Component Value Date/Time   WBC 11.6 (H) 05/16/2023 1520   RBC 3.72 (L) 05/16/2023 1520   HGB 11.0 (L) 05/16/2023 1520   HCT 32.9 (L) 05/16/2023 1520   PLT 327 05/16/2023 1520   MCV 88.4 05/16/2023 1520   MCH 29.6 05/16/2023 1520   MCHC 33.4 05/16/2023 1520   RDW 13.9 05/16/2023 1520   LYMPHSABS 2.1 05/16/2023 1520   MONOABS 1.0 05/16/2023 1520   EOSABS 0.5  05/16/2023 1520   BASOSABS 0.1 05/16/2023 1520    BMET    Component Value Date/Time   NA 133 (L) 05/16/2023 1520   NA 133 (L) 02/17/2021 0924   K 4.8 05/16/2023 1520   CL 85 (L) 05/16/2023 1520   CO2 30 05/16/2023 1520   GLUCOSE 171 (H) 05/16/2023 1520   BUN 74 (H) 05/16/2023 1520   BUN 77 (HH) 02/17/2021 0924   CREATININE 17.28 (H) 05/16/2023 1520   CALCIUM  9.8  05/16/2023 1520   GFRNONAA 3 (L) 05/16/2023 1520   GFRAA 35 (L) 10/14/2019 1041    COAGS: Lab Results  Component Value Date   INR 1.0 03/25/2021   INR 0.9 03/11/2019     Non-Invasive Vascular Imaging:    N/a   ASSESSMENT/PLAN: This is a 63 y.o. male 63 y.o. male with end-stage renal disease that presents for right arm fistulogram due to malfunction of right basilic vein fistula.  Patient unclear how long the fistula has had issues but states he was told in dialysis he needs to be evaluated.  Discussed plan for right arm fistulogram with possible invention including angioplasty and stent.  Lonni DOROTHA Gaskins, MD [facility] Office: [phone]  Lonni JINNY Gaskins

## 2024-05-29 ENCOUNTER — Other Ambulatory Visit: Payer: Self-pay

## 2024-06-08 ENCOUNTER — Other Ambulatory Visit: Payer: Self-pay

## 2024-06-08 ENCOUNTER — Emergency Department (HOSPITAL_COMMUNITY)

## 2024-06-08 ENCOUNTER — Inpatient Hospital Stay (HOSPITAL_COMMUNITY)

## 2024-06-08 ENCOUNTER — Inpatient Hospital Stay (HOSPITAL_COMMUNITY)
Admission: EM | Admit: 2024-06-08 | Discharge: 2024-06-11 | DRG: 189 | Disposition: A | Attending: Internal Medicine | Admitting: Internal Medicine

## 2024-06-08 DIAGNOSIS — E875 Hyperkalemia: Secondary | ICD-10-CM | POA: Diagnosis present

## 2024-06-08 DIAGNOSIS — J9601 Acute respiratory failure with hypoxia: Secondary | ICD-10-CM | POA: Diagnosis present

## 2024-06-08 DIAGNOSIS — J441 Chronic obstructive pulmonary disease with (acute) exacerbation: Secondary | ICD-10-CM | POA: Diagnosis present

## 2024-06-08 DIAGNOSIS — F419 Anxiety disorder, unspecified: Secondary | ICD-10-CM | POA: Diagnosis not present

## 2024-06-08 DIAGNOSIS — R0602 Shortness of breath: Principal | ICD-10-CM

## 2024-06-08 DIAGNOSIS — I5042 Chronic combined systolic (congestive) and diastolic (congestive) heart failure: Secondary | ICD-10-CM | POA: Diagnosis present

## 2024-06-08 DIAGNOSIS — N2581 Secondary hyperparathyroidism of renal origin: Secondary | ICD-10-CM | POA: Diagnosis present

## 2024-06-08 DIAGNOSIS — F0394 Unspecified dementia, unspecified severity, with anxiety: Secondary | ICD-10-CM | POA: Diagnosis present

## 2024-06-08 DIAGNOSIS — E1122 Type 2 diabetes mellitus with diabetic chronic kidney disease: Secondary | ICD-10-CM | POA: Diagnosis present

## 2024-06-08 DIAGNOSIS — G4700 Insomnia, unspecified: Secondary | ICD-10-CM | POA: Diagnosis present

## 2024-06-08 DIAGNOSIS — R0600 Dyspnea, unspecified: Secondary | ICD-10-CM

## 2024-06-08 DIAGNOSIS — I350 Nonrheumatic aortic (valve) stenosis: Secondary | ICD-10-CM | POA: Diagnosis present

## 2024-06-08 DIAGNOSIS — E1140 Type 2 diabetes mellitus with diabetic neuropathy, unspecified: Secondary | ICD-10-CM | POA: Diagnosis present

## 2024-06-08 DIAGNOSIS — F32A Depression, unspecified: Secondary | ICD-10-CM | POA: Diagnosis present

## 2024-06-08 DIAGNOSIS — E1151 Type 2 diabetes mellitus with diabetic peripheral angiopathy without gangrene: Secondary | ICD-10-CM | POA: Diagnosis present

## 2024-06-08 DIAGNOSIS — Z7901 Long term (current) use of anticoagulants: Secondary | ICD-10-CM | POA: Diagnosis not present

## 2024-06-08 DIAGNOSIS — I132 Hypertensive heart and chronic kidney disease with heart failure and with stage 5 chronic kidney disease, or end stage renal disease: Secondary | ICD-10-CM | POA: Diagnosis present

## 2024-06-08 DIAGNOSIS — Z1152 Encounter for screening for COVID-19: Secondary | ICD-10-CM | POA: Diagnosis not present

## 2024-06-08 DIAGNOSIS — F0393 Unspecified dementia, unspecified severity, with mood disturbance: Secondary | ICD-10-CM | POA: Diagnosis present

## 2024-06-08 DIAGNOSIS — N186 End stage renal disease: Secondary | ICD-10-CM | POA: Diagnosis present

## 2024-06-08 DIAGNOSIS — E785 Hyperlipidemia, unspecified: Secondary | ICD-10-CM | POA: Diagnosis present

## 2024-06-08 DIAGNOSIS — F17210 Nicotine dependence, cigarettes, uncomplicated: Secondary | ICD-10-CM | POA: Diagnosis present

## 2024-06-08 DIAGNOSIS — F03918 Unspecified dementia, unspecified severity, with other behavioral disturbance: Secondary | ICD-10-CM | POA: Diagnosis present

## 2024-06-08 DIAGNOSIS — E871 Hypo-osmolality and hyponatremia: Secondary | ICD-10-CM | POA: Diagnosis present

## 2024-06-08 DIAGNOSIS — D631 Anemia in chronic kidney disease: Secondary | ICD-10-CM | POA: Diagnosis present

## 2024-06-08 DIAGNOSIS — Z992 Dependence on renal dialysis: Secondary | ICD-10-CM | POA: Diagnosis not present

## 2024-06-08 DIAGNOSIS — I480 Paroxysmal atrial fibrillation: Secondary | ICD-10-CM | POA: Diagnosis present

## 2024-06-08 LAB — COMPREHENSIVE METABOLIC PANEL WITH GFR
ALT: 29 U/L (ref 0–44)
AST: 25 U/L (ref 15–41)
Albumin: 3.2 g/dL — ABNORMAL LOW (ref 3.5–5.0)
Alkaline Phosphatase: 326 U/L — ABNORMAL HIGH (ref 38–126)
Anion gap: 17 — ABNORMAL HIGH (ref 5–15)
BUN: 51 mg/dL — ABNORMAL HIGH (ref 8–23)
CO2: 24 mmol/L (ref 22–32)
Calcium: 8.7 mg/dL — ABNORMAL LOW (ref 8.9–10.3)
Chloride: 85 mmol/L — ABNORMAL LOW (ref 98–111)
Creatinine, Ser: 11.36 mg/dL — ABNORMAL HIGH (ref 0.61–1.24)
GFR, Estimated: 5 mL/min — ABNORMAL LOW (ref >60)
Glucose, Bld: 137 mg/dL — ABNORMAL HIGH (ref 70–99)
Potassium: >7.5 mmol/L (ref 3.5–5.1)
Sodium: 126 mmol/L — ABNORMAL LOW (ref 135–145)
Total Bilirubin: 1.3 mg/dL — ABNORMAL HIGH (ref 0.0–1.2)
Total Protein: 7 g/dL (ref 6.5–8.1)

## 2024-06-08 LAB — CBC WITH DIFFERENTIAL/PLATELET
Abs Immature Granulocytes: 0.07 K/uL (ref 0.00–0.07)
Basophils Absolute: 0.1 K/uL (ref 0.0–0.1)
Basophils Relative: 1 %
Eosinophils Absolute: 0.3 K/uL (ref 0.0–0.5)
Eosinophils Relative: 3 %
HCT: 39.9 % (ref 39.0–52.0)
Hemoglobin: 12.8 g/dL — ABNORMAL LOW (ref 13.0–17.0)
Immature Granulocytes: 1 %
Lymphocytes Relative: 11 %
Lymphs Abs: 1.3 K/uL (ref 0.7–4.0)
MCH: 27 pg (ref 26.0–34.0)
MCHC: 32.1 g/dL (ref 30.0–36.0)
MCV: 84.2 fL (ref 80.0–100.0)
Monocytes Absolute: 1.5 K/uL — ABNORMAL HIGH (ref 0.1–1.0)
Monocytes Relative: 12 %
Neutro Abs: 8.8 K/uL — ABNORMAL HIGH (ref 1.7–7.7)
Neutrophils Relative %: 72 %
Platelets: 429 K/uL — ABNORMAL HIGH (ref 150–400)
RBC: 4.74 MIL/uL (ref 4.22–5.81)
RDW: 16.6 % — ABNORMAL HIGH (ref 11.5–15.5)
WBC: 12 K/uL — ABNORMAL HIGH (ref 4.0–10.5)
nRBC: 0 % (ref 0.0–0.2)

## 2024-06-08 LAB — I-STAT ARTERIAL BLOOD GAS, ED
Acid-Base Excess: 8 mmol/L — ABNORMAL HIGH (ref 0.0–2.0)
Bicarbonate: 31.7 mmol/L — ABNORMAL HIGH (ref 20.0–28.0)
Calcium, Ion: 1.07 mmol/L — ABNORMAL LOW (ref 1.15–1.40)
HCT: 36 % — ABNORMAL LOW (ref 39.0–52.0)
Hemoglobin: 12.2 g/dL — ABNORMAL LOW (ref 13.0–17.0)
O2 Saturation: 83 %
Patient temperature: 98.5
Potassium: 5.7 mmol/L — ABNORMAL HIGH (ref 3.5–5.1)
Sodium: 126 mmol/L — ABNORMAL LOW (ref 135–145)
TCO2: 33 mmol/L — ABNORMAL HIGH (ref 22–32)
pCO2 arterial: 39.5 mmHg (ref 32–48)
pH, Arterial: 7.512 — ABNORMAL HIGH (ref 7.35–7.45)
pO2, Arterial: 42 mmHg — ABNORMAL LOW (ref 83–108)

## 2024-06-08 LAB — CBC
HCT: 36.5 % — ABNORMAL LOW (ref 39.0–52.0)
Hemoglobin: 11.9 g/dL — ABNORMAL LOW (ref 13.0–17.0)
MCH: 27.5 pg (ref 26.0–34.0)
MCHC: 32.6 g/dL (ref 30.0–36.0)
MCV: 84.3 fL (ref 80.0–100.0)
Platelets: 416 K/uL — ABNORMAL HIGH (ref 150–400)
RBC: 4.33 MIL/uL (ref 4.22–5.81)
RDW: 16.4 % — ABNORMAL HIGH (ref 11.5–15.5)
WBC: 11.2 K/uL — ABNORMAL HIGH (ref 4.0–10.5)
nRBC: 0 % (ref 0.0–0.2)

## 2024-06-08 LAB — I-STAT CHEM 8, ED
BUN: 30 mg/dL — ABNORMAL HIGH (ref 8–23)
Calcium, Ion: 0.75 mmol/L — CL (ref 1.15–1.40)
Chloride: 107 mmol/L (ref 98–111)
Creatinine, Ser: 5.3 mg/dL — ABNORMAL HIGH (ref 0.61–1.24)
Glucose, Bld: 79 mg/dL (ref 70–99)
HCT: 26 % — ABNORMAL LOW (ref 39.0–52.0)
Hemoglobin: 8.8 g/dL — ABNORMAL LOW (ref 13.0–17.0)
Potassium: 8.2 mmol/L (ref 3.5–5.1)
Sodium: 133 mmol/L — ABNORMAL LOW (ref 135–145)
TCO2: 20 mmol/L — ABNORMAL LOW (ref 22–32)

## 2024-06-08 LAB — HEMOGLOBIN A1C
Hgb A1c MFr Bld: 6.2 % — ABNORMAL HIGH (ref 4.8–5.6)
Mean Plasma Glucose: 131.24 mg/dL

## 2024-06-08 LAB — MAGNESIUM: Magnesium: 2.6 mg/dL — ABNORMAL HIGH (ref 1.7–2.4)

## 2024-06-08 LAB — TROPONIN I (HIGH SENSITIVITY): Troponin I (High Sensitivity): 23 ng/L — ABNORMAL HIGH (ref <18)

## 2024-06-08 LAB — BASIC METABOLIC PANEL WITH GFR
Anion gap: 13 (ref 5–15)
BUN: 28 mg/dL — ABNORMAL HIGH (ref 8–23)
CO2: 31 mmol/L (ref 22–32)
Calcium: 9 mg/dL (ref 8.9–10.3)
Chloride: 87 mmol/L — ABNORMAL LOW (ref 98–111)
Creatinine, Ser: 7.25 mg/dL — ABNORMAL HIGH (ref 0.61–1.24)
GFR, Estimated: 8 mL/min — ABNORMAL LOW (ref >60)
Glucose, Bld: 111 mg/dL — ABNORMAL HIGH (ref 70–99)
Potassium: 5.9 mmol/L — ABNORMAL HIGH (ref 3.5–5.1)
Sodium: 131 mmol/L — ABNORMAL LOW (ref 135–145)

## 2024-06-08 LAB — RESP PANEL BY RT-PCR (RSV, FLU A&B, COVID)  RVPGX2
Influenza A by PCR: NEGATIVE
Influenza B by PCR: NEGATIVE
Resp Syncytial Virus by PCR: NEGATIVE
SARS Coronavirus 2 by RT PCR: NEGATIVE

## 2024-06-08 LAB — HEPATITIS B SURFACE ANTIGEN: Hepatitis B Surface Ag: NONREACTIVE

## 2024-06-08 LAB — BRAIN NATRIURETIC PEPTIDE: B Natriuretic Peptide: 1475.3 pg/mL — ABNORMAL HIGH (ref 0.0–100.0)

## 2024-06-08 LAB — HIV ANTIBODY (ROUTINE TESTING W REFLEX): HIV Screen 4th Generation wRfx: NONREACTIVE

## 2024-06-08 LAB — CBG MONITORING, ED: Glucose-Capillary: 180 mg/dL — ABNORMAL HIGH (ref 70–99)

## 2024-06-08 LAB — LIPASE, BLOOD: Lipase: 28 U/L (ref 11–51)

## 2024-06-08 MED ORDER — CARVEDILOL 3.125 MG PO TABS
6.2500 mg | ORAL_TABLET | Freq: Two times a day (BID) | ORAL | Status: DC
  Administered 2024-06-08: 6.25 mg via ORAL
  Filled 2024-06-08: qty 2

## 2024-06-08 MED ORDER — ALBUTEROL SULFATE (2.5 MG/3ML) 0.083% IN NEBU
10.0000 mg | INHALATION_SOLUTION | Freq: Once | RESPIRATORY_TRACT | Status: AC
  Administered 2024-06-08: 10 mg via RESPIRATORY_TRACT
  Filled 2024-06-08: qty 12

## 2024-06-08 MED ORDER — FLUTICASONE FUROATE-VILANTEROL 100-25 MCG/ACT IN AEPB
1.0000 | INHALATION_SPRAY | Freq: Every day | RESPIRATORY_TRACT | Status: DC
  Filled 2024-06-08: qty 28

## 2024-06-08 MED ORDER — HYDROCODONE-ACETAMINOPHEN 5-325 MG PO TABS
1.0000 | ORAL_TABLET | Freq: Once | ORAL | Status: AC
  Administered 2024-06-08: 1 via ORAL

## 2024-06-08 MED ORDER — DOXYCYCLINE HYCLATE 100 MG PO TABS
100.0000 mg | ORAL_TABLET | Freq: Two times a day (BID) | ORAL | Status: DC
  Administered 2024-06-08 – 2024-06-10 (×5): 100 mg via ORAL
  Filled 2024-06-08 (×6): qty 1

## 2024-06-08 MED ORDER — FERRIC CITRATE 1 GM 210 MG(FE) PO TABS
630.0000 mg | ORAL_TABLET | Freq: Two times a day (BID) | ORAL | Status: DC
  Administered 2024-06-09 – 2024-06-11 (×4): 630 mg via ORAL
  Filled 2024-06-08 (×6): qty 3

## 2024-06-08 MED ORDER — INSULIN ASPART 100 UNIT/ML IV SOLN
5.0000 [IU] | Freq: Once | INTRAVENOUS | Status: AC
  Administered 2024-06-08: 5 [IU] via INTRAVENOUS
  Filled 2024-06-08: qty 5

## 2024-06-08 MED ORDER — HEPARIN SODIUM (PORCINE) 1000 UNIT/ML DIALYSIS
2500.0000 [IU] | Freq: Once | INTRAMUSCULAR | Status: AC
  Administered 2024-06-08: 2500 [IU] via INTRAVENOUS_CENTRAL

## 2024-06-08 MED ORDER — ROSUVASTATIN CALCIUM 5 MG PO TABS
10.0000 mg | ORAL_TABLET | Freq: Every day | ORAL | Status: DC
  Administered 2024-06-09 – 2024-06-10 (×2): 10 mg via ORAL
  Filled 2024-06-08 (×3): qty 2

## 2024-06-08 MED ORDER — HEPARIN SODIUM (PORCINE) 5000 UNIT/ML IJ SOLN
5000.0000 [IU] | Freq: Three times a day (TID) | INTRAMUSCULAR | Status: DC
  Administered 2024-06-08: 5000 [IU] via SUBCUTANEOUS
  Filled 2024-06-08: qty 1

## 2024-06-08 MED ORDER — IPRATROPIUM BROMIDE 0.02 % IN SOLN
0.5000 mg | Freq: Four times a day (QID) | RESPIRATORY_TRACT | Status: DC
  Administered 2024-06-08 – 2024-06-10 (×6): 0.5 mg via RESPIRATORY_TRACT
  Filled 2024-06-08 (×7): qty 2.5

## 2024-06-08 MED ORDER — DEXTROSE 50 % IV SOLN
1.0000 | Freq: Once | INTRAVENOUS | Status: AC
  Administered 2024-06-08: 50 mL via INTRAVENOUS
  Filled 2024-06-08: qty 50

## 2024-06-08 MED ORDER — ANTICOAGULANT SODIUM CITRATE 4% (200MG/5ML) IV SOLN: 5.0000 mL | Status: DC | PRN

## 2024-06-08 MED ORDER — LEVETIRACETAM 500 MG PO TABS
500.0000 mg | ORAL_TABLET | Freq: Two times a day (BID) | ORAL | Status: DC
  Administered 2024-06-08 – 2024-06-10 (×5): 500 mg via ORAL
  Filled 2024-06-08 (×6): qty 1

## 2024-06-08 MED ORDER — LIDOCAINE HCL (PF) 1 % IJ SOLN: 5.0000 mL | INTRAMUSCULAR | Status: DC | PRN

## 2024-06-08 MED ORDER — SODIUM ZIRCONIUM CYCLOSILICATE 10 G PO PACK
10.0000 g | PACK | Freq: Once | ORAL | Status: AC
  Administered 2024-06-08: 10 g via ORAL
  Filled 2024-06-08: qty 1

## 2024-06-08 MED ORDER — ALTEPLASE 2 MG IJ SOLR: 2.0000 mg | Freq: Once | INTRAMUSCULAR | Status: DC | PRN

## 2024-06-08 MED ORDER — METHYLPREDNISOLONE SODIUM SUCC 125 MG IJ SOLR
60.0000 mg | Freq: Two times a day (BID) | INTRAMUSCULAR | Status: DC
  Administered 2024-06-08 – 2024-06-10 (×3): 60 mg via INTRAVENOUS
  Filled 2024-06-08 (×3): qty 2

## 2024-06-08 MED ORDER — CALCIUM GLUCONATE-NACL 1-0.675 GM/50ML-% IV SOLN
1.0000 g | Freq: Once | INTRAVENOUS | Status: AC
  Administered 2024-06-08: 1000 mg via INTRAVENOUS
  Filled 2024-06-08: qty 50

## 2024-06-08 MED ORDER — TRAZODONE HCL 50 MG PO TABS
50.0000 mg | ORAL_TABLET | Freq: Every day | ORAL | Status: DC
  Administered 2024-06-08 – 2024-06-10 (×3): 50 mg via ORAL
  Filled 2024-06-08 (×3): qty 1

## 2024-06-08 MED ORDER — HYDROCODONE-ACETAMINOPHEN 5-325 MG PO TABS
ORAL_TABLET | ORAL | Status: AC
  Filled 2024-06-08: qty 1

## 2024-06-08 MED ORDER — CHLORHEXIDINE GLUCONATE CLOTH 2 % EX PADS: 6.0000 | MEDICATED_PAD | Freq: Every day | CUTANEOUS | Status: DC

## 2024-06-08 MED ORDER — AMLODIPINE BESYLATE 5 MG PO TABS
5.0000 mg | ORAL_TABLET | Freq: Every day | ORAL | Status: DC
  Administered 2024-06-08 – 2024-06-10 (×3): 5 mg via ORAL
  Filled 2024-06-08 (×4): qty 1

## 2024-06-08 MED ORDER — CITALOPRAM HYDROBROMIDE 20 MG PO TABS
40.0000 mg | ORAL_TABLET | Freq: Every day | ORAL | Status: DC
  Administered 2024-06-09 – 2024-06-10 (×2): 40 mg via ORAL
  Filled 2024-06-08 (×2): qty 2
  Filled 2024-06-08: qty 4

## 2024-06-08 MED ORDER — ALBUTEROL SULFATE (2.5 MG/3ML) 0.083% IN NEBU
10.0000 mg/h | INHALATION_SOLUTION | Freq: Once | RESPIRATORY_TRACT | Status: AC
  Administered 2024-06-08: 10 mg/h via RESPIRATORY_TRACT
  Filled 2024-06-08: qty 12

## 2024-06-08 MED ORDER — SODIUM ZIRCONIUM CYCLOSILICATE 10 G PO PACK
10.0000 g | PACK | Freq: Once | ORAL | Status: AC
  Administered 2024-06-09: 10 g via ORAL
  Filled 2024-06-08: qty 1

## 2024-06-08 MED ORDER — LIDOCAINE-PRILOCAINE 2.5-2.5 % EX CREA: 1.0000 | TOPICAL_CREAM | CUTANEOUS | Status: DC | PRN

## 2024-06-08 MED ORDER — METOPROLOL TARTRATE 12.5 MG HALF TABLET
12.5000 mg | ORAL_TABLET | Freq: Four times a day (QID) | ORAL | Status: DC
  Administered 2024-06-08 – 2024-06-10 (×5): 12.5 mg via ORAL
  Filled 2024-06-08 (×5): qty 1

## 2024-06-08 MED ORDER — METHYLPREDNISOLONE SODIUM SUCC 125 MG IJ SOLR
125.0000 mg | INTRAMUSCULAR | Status: AC
  Administered 2024-06-08: 125 mg via INTRAVENOUS
  Filled 2024-06-08: qty 2

## 2024-06-08 MED ORDER — PENTAFLUOROPROP-TETRAFLUOROETH EX AERO: 1.0000 | INHALATION_SPRAY | CUTANEOUS | Status: DC | PRN

## 2024-06-08 MED ORDER — HEPARIN SODIUM (PORCINE) 1000 UNIT/ML DIALYSIS: 1000.0000 [IU] | INTRAMUSCULAR | Status: DC | PRN

## 2024-06-08 MED ORDER — PREGABALIN 25 MG PO CAPS
25.0000 mg | ORAL_CAPSULE | Freq: Every day | ORAL | Status: DC
  Administered 2024-06-09 – 2024-06-10 (×2): 25 mg via ORAL
  Filled 2024-06-08 (×2): qty 1

## 2024-06-08 MED ORDER — APIXABAN (ELIQUIS) VTE STARTER PACK (10MG AND 5MG)
ORAL_TABLET | ORAL | 0 refills | Status: DC
  Filled 2024-06-08: qty 74, fill #0

## 2024-06-08 MED ORDER — HEPARIN (PORCINE) 25000 UT/250ML-% IV SOLN
1900.0000 [IU]/h | INTRAVENOUS | Status: DC
  Administered 2024-06-08: 1000 [IU]/h via INTRAVENOUS
  Administered 2024-06-09: 1700 [IU]/h via INTRAVENOUS
  Filled 2024-06-08 (×2): qty 250

## 2024-06-08 NOTE — ED Notes (Signed)
 SpO2 dropped to 84%RA while ambulating with [place]ef[place] NT.  MD Dasie notified and aware.

## 2024-06-08 NOTE — ED Triage Notes (Signed)
 Pt bib [facility] from home c/o of shob while smoking a cigarette. Also c/o of diarrhea x 4days. Hx of COPD. C/o pain in middle of abd. Dialysis TTS, last trx possibly Tuesday, but pt cannot remember.

## 2024-06-08 NOTE — Discharge Instructions (Addendum)
Information on my medicine - ELIQUIS? (apixaban) ? ?This medication education was reviewed with me or my healthcare representative as part of my discharge preparation.  ? ?Why was Eliquis? prescribed for you? ?Eliquis? was prescribed for you to reduce the risk of forming blood clots that can cause a stroke if you have a medical condition called atrial fibrillation (a type of irregular heartbeat) OR to reduce the risk of a blood clots forming after orthopedic surgery. ? ?What do You need to know about Eliquis? ? ?Take your Eliquis? TWICE DAILY - one tablet in the morning and one tablet in the evening with or without food.  It would be best to take the doses about the same time each day. ? ?If you have difficulty swallowing the tablet whole please discuss with your pharmacist how to take the medication safely. ? ?Take Eliquis? exactly as prescribed by your doctor and DO NOT stop taking Eliquis? without talking to the doctor who prescribed the medication.  Stopping may increase your risk of developing a new clot or stroke.  Refill your prescription before you run out. ? ?After discharge, you should have regular check-up appointments with your healthcare provider that is prescribing your Eliquis?.  In the future your dose may need to be changed if your kidney function or weight changes by a significant amount or as you get older. ? ?What do you do if you miss a dose? ?If you miss a dose, take it as soon as you remember on the same day and resume taking twice daily.  Do not take more than one dose of ELIQUIS at the same time. ? ?Important Safety Information ?A possible side effect of Eliquis? is bleeding. You should call your healthcare provider right away if you experience any of the following: ?Bleeding from an injury or your nose that does not stop. ?Unusual colored urine (red or dark brown) or unusual colored stools (red or black). ?Unusual bruising for unknown reasons. ?A serious fall or if you hit your head (even  if there is no bleeding). ? ?Some medicines may interact with Eliquis? and might increase your risk of bleeding or clotting while on Eliquis?. To help avoid this, consult your healthcare provider or pharmacist prior to using any new prescription or non-prescription medications, including herbals, vitamins, non-steroidal anti-inflammatory drugs (NSAIDs) and supplements. ? ?This website has more information on Eliquis? (apixaban): http://www.eliquis.com/eliquis/home ?  ?

## 2024-06-08 NOTE — ED Notes (Signed)
 Pt brought on 6L nasal cannula from hemodialysis.  At baseline, patient does not wear oxygen  at home but does have COPD.  Pt daughter at bedside stating he has been complaining of being short of breath.  This RN took oxygen  off and patient SpO2 dropped to 89-90% RA.  MD Dasie notified about family concerns.  MD Dasie came to bedside  and requested NT to ambulate patient while noting SpO2.  Awaiting results from ambulation and MD Dasie to be notified by [facility] NT.

## 2024-06-08 NOTE — ED Notes (Signed)
 Pt to dialysis.

## 2024-06-08 NOTE — Progress Notes (Signed)
 PHARMACY - ANTICOAGULATION CONSULT NOTE  Pharmacy Consult for IV heparin  Indication: atrial fibrillation  Allergies[1]  Patient Measurements: Height: 6' 2 (188 cm) Weight:  (No wt due to special bed.) IBW/kg (Calculated) : 82.2 HEPARIN  DW (KG): 93  Vital Signs: Temp: 98.5 F (36.9 C) (12/13 1835) Temp Source: Oral (12/13 1835) BP: 111/80 (12/13 2207) Pulse Rate: 121 (12/13 2200)  Labs: Recent Labs    06/08/24 0845 06/08/24 1955 06/08/24 2024 06/08/24 2220  HGB 12.8* 8.8* 11.9* 12.2*  HCT 39.9 26.0* 36.5* 36.0*  PLT 429*  --  416*  --   CREATININE 11.36* 5.30* 7.25*  --   TROPONINIHS  --   --  23*  --     Estimated Creatinine Clearance: 11.8 mL/min (A) (by C-G formula based on SCr of 7.25 mg/dL (H)).   Medical History: Past Medical History:  Diagnosis Date   Acute renal failure superimposed on stage 2 chronic kidney disease 06/12/2018   Alcohol  abuse 10/18/2017   Allergy, unspecified, initial encounter 03/26/2021   Anaphylactic shock, unspecified, initial encounter 03/26/2021   Anemia in chronic kidney disease 03/26/2021   Anxiety and depression 10/06/2017   Arthritis    Atherosclerotic heart disease of native coronary artery without angina pectoris 03/26/2021   Bilateral lower extremity edema 06/11/2018   Bradycardia with 41-50 beats per minute 05/05/2021   CAD (coronary artery disease) 10/06/2017   CHF (congestive heart failure) (HCC)    CKD (chronic kidney disease) 07/20/2018   CKD (chronic kidney disease) stage 5, GFR less than 15 ml/min (HCC)    dialysis T/Th/Sa   Complication of vascular dialysis catheter 03/26/2021   COPD (chronic obstructive pulmonary disease) (HCC) 09/29/2017   Coronary artery calcification seen on CT scan 10/09/2017   Cough    Diarrhea, unspecified 03/26/2021   Disorder of phosphorus metabolism, unspecified 07/28/2021   Dyspnea    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   Edema 07/20/2018   ESRD (end stage renal disease) (HCC)     TTHSAT  Geronimo Car   Fever, unspecified 03/26/2021   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 Tremors whebn he moves it.   Shortness of breath 03/26/2021   Tobacco abuse 09/29/2017   Type 2 diabetes mellitus (HCC) 10/17/2011   diet controlled - per pt does not check CBG at home- insurance did not approve - 08/13/21   Type 2 diabetes mellitus with diabetic peripheral angiopathy without gangrene (HCC) 03/26/2021   Type 2 diabetes mellitus with other diabetic kidney complication (HCC) 03/26/2021    Assessment: [name] Reckner Sr. is a 63 y.o. year old male admitted on 06/08/2024 with concern for new onset AF. No anticoagulation prior to admission. Heparin  for DVT prophylaxis started initially, last dose 5000 units SQ at 06/08/24 @ 2211. Pharmacy consulted to dose heparin .  Goal of Therapy:  Heparin  level 0.3-0.7 units/ml Monitor platelets by anticoagulation protocol: Yes   Plan:  Heparin  infusion at 1000 units/hr (no bolus) 8h heparin  level  Daily heparin  level, CBC, and monitoring for bleeding F/u plans for anticoagulation - likely to start apixaban   in the AM    Thank you for allowing pharmacy to participate in this patient's care.  Leonor GORMAN Bash, PharmD Emergency Medicine Clinical Pharmacist 06/08/2024,10:57 PM       [1] No Known Allergies

## 2024-06-08 NOTE — ED Provider Notes (Signed)
 [place] EMERGENCY DEPARTMENT AT [facility] Provider Note   CSN: [id number] Arrival date & time: 06/08/24  9163     Patient presents with: Shortness of Breath   [name] Mccollam Sr. is a 63 y.o. male.   [name] Cranston Sr. is a 63 year old male with a history significant for type 2 diabetes mellitus (diet-controlled), Seizures, ESRD (on HD T, Th, Sat) Fibrillary glomerulonephritis, alcohol  abuse, hypertension, Nicotine  dependence (1 ppd down from 2-3 ppd since age 47) coronary artery calcifications, ACDF of C4,5 and C5,6 on 09/14/17  By Dr Onetha secondary to Myelopathy, chronic right parietal lobe infarct  who presents to the ED via EMS with a chief complaint of shortness of breath x today.  Patient states that he has had diarrhea for the last couple of days, he thought he was likely constipated therefore he took some Dulcolax which gave him multiple episodes of nonbloody diarrhea.  He reports I feel like abdomen everything out .  He also tells me he began to feel short of breath yesterday, does have dialysis Tuesday, Thursday, Saturday but last went on Tuesday and did not receive the full session.  He does not wear oxygen  at home however came in with nasal cannula on 1 L.  He tells me that he is getting more short of breath as the days go on.  He also endorses a cough however this could be due to his longstanding history of 1 pack a day tobacco use. Some middle abdominal cramping. No fever, no vomiting, no other complaints reported.   The history is provided by the patient and the EMS personnel.  Shortness of Breath Severity:  Moderate Onset quality:  Gradual Duration:  2 days Timing:  Constant Progression:  Worsening Chronicity:  Recurrent Context: smoke exposure   Associated symptoms: cough   Associated symptoms: no abdominal pain, no chest pain, no fever and no vomiting        Prior to Admission medications  Medication Sig Start Date End Date Taking? Authorizing Provider   albuterol  (VENTOLIN  HFA) 108 (90 Base) MCG/ACT inhaler Inhale 2 puffs into the lungs every 6 (six) hours as needed for COPD. 04/06/24  Yes Vicci Barnie NOVAK, MD  amLODipine  (NORVASC ) 5 MG tablet Take 5 mg by mouth daily.   Yes [provider]  AURYXIA  1 GM 210 MG(Fe) tablet Take 630 mg by mouth 2 (two) times daily. and one with snacks 04/15/21  Yes [provider]  carvedilol  (COREG ) 6.25 MG tablet Take 1 tablet (6.25 mg total) by mouth 2 (two) times daily. 08/21/23  Yes Newlin, Enobong, MD  citalopram  (CELEXA ) 40 MG tablet Take 1 tablet (40 mg total) by mouth daily.Must have office visit for refills 08/21/23  Yes Newlin, Enobong, MD  dicyclomine  (BENTYL ) 10 MG capsule Take 1 capsule (10 mg total) by mouth 3 (three) times daily as needed for spasms (Diarrhea). 04/06/24  Yes Vicci Barnie NOVAK, MD  fluticasone -salmeterol (ADVAIR ) 100-50 MCG/ACT AEPB Inhale 1 puff into the lungs 2 (two) times daily. 08/21/23  Yes Newlin, Enobong, MD  levETIRAcetam  (KEPPRA ) 500 MG tablet Take 1 tablet (500 mg total) by mouth 2 (two) times daily. 04/06/24  Yes Vicci Barnie NOVAK, MD  pregabalin  (LYRICA ) 25 MG capsule Take 1 capsule (25 mg total) by mouth at bedtime. 04/06/24  Yes Vicci Barnie NOVAK, MD  rosuvastatin  (CRESTOR ) 10 MG tablet Take 1 tablet (10 mg total) by mouth daily. 08/21/23  Yes Newlin, Enobong, MD  sodium zirconium cyclosilicate  (LOKELMA ) 10  g PACK packet Mix 10 g (1 packet) according to packet instructions and take by mouth every Wednesday and Saturday. 06/07/23  Yes   traZODone  (DESYREL ) 50 MG tablet Take 1 tablet (50 mg total) by mouth at bedtime. 04/06/24  Yes Vicci Barnie NOVAK, MD  Accu-Chek Softclix Lancets lancets Use as instructed tid before meals. Dx E11.22 12/18/23   Newlin, Enobong, MD  Blood Glucose Monitoring Suppl (ACCU-CHEK GUIDE) w/Device KIT Use as directed 3 times daily. 12/18/23   Newlin, Enobong, MD  cyclobenzaprine  (FLEXERIL ) 5 MG tablet Take 1 tablet (5 mg total) by  mouth daily as needed for muscle spasms. After dialysis on Tuesday, Thursdays and Saturdays Patient not taking: Reported on 06/08/2024 12/18/23   Newlin, Enobong, MD  glucose blood (ACCU-CHEK GUIDE TEST) test strip Use as instructed 3 times daily. Dx E11.22 12/18/23   Newlin, Enobong, MD  glucose blood (ACCU-CHEK GUIDE) test strip Use as instructed 3 times daily. Dx E11.22 08/15/22   Newlin, Enobong, MD  hydrOXYzine  (ATARAX ) 25 MG tablet Take 1 tablet (25 mg total) by mouth 3 (three) times daily as needed for anxiety.Must have office visit for refills. Patient not taking: Reported on 06/08/2024 08/21/23   Newlin, Enobong, MD    Allergies: Patient has no known allergies.    Review of Systems  Constitutional:  Negative for chills and fever.  Respiratory:  Positive for cough and shortness of breath.   Cardiovascular:  Negative for chest pain and leg swelling.  Gastrointestinal:  Positive for diarrhea. Negative for abdominal pain, nausea and vomiting.  All other systems reviewed and are negative.   Updated Vital Signs BP (!) 142/74   Pulse 79   Temp 98.2 F (36.8 C) (Oral)   Resp (!) 22   Ht 6' 2 (1.88 m)   Wt 93 kg   SpO2 96%   BMI 26.32 kg/m   Physical Exam Vitals and nursing note reviewed.  Constitutional:      Appearance: He is ill-appearing.     Comments: Chronically ill appearing.   HENT:     Head: Normocephalic and atraumatic.  Cardiovascular:     Rate and Rhythm: Normal rate.  Pulmonary:     Effort: Tachypnea present.     Breath sounds: Examination of the right-upper field reveals wheezing. Wheezing present.  Abdominal:     General: Bowel sounds are normal.     Palpations: Abdomen is soft.     Tenderness: There is no abdominal tenderness.  Musculoskeletal:     Cervical back: Normal range of motion and neck supple.     Right lower leg: Edema present.     Left lower leg: Edema present.     Comments: 1+ pitting edema bilaterally.   Skin:    General: Skin is warm and  dry.  Neurological:     Mental Status: He is alert and oriented to person, place, and time.     (all labs ordered are listed, but only abnormal results are displayed) Labs Reviewed  CBC WITH DIFFERENTIAL/PLATELET - Abnormal; Notable for the following components:      Result Value   WBC 12.0 (*)    Hemoglobin 12.8 (*)    RDW 16.6 (*)    Platelets 429 (*)    Neutro Abs 8.8 (*)    Monocytes Absolute 1.5 (*)    All other components within normal limits  COMPREHENSIVE METABOLIC PANEL WITH GFR - Abnormal; Notable for the following components:   Sodium 126 (*)    Potassium >7.5 (*)  Chloride 85 (*)    Glucose, Bld 137 (*)    BUN 51 (*)    Creatinine, Ser 11.36 (*)    Calcium  8.7 (*)    Albumin 3.2 (*)    Alkaline Phosphatase 326 (*)    Total Bilirubin 1.3 (*)    GFR, Estimated 5 (*)    Anion gap 17 (*)    All other components within normal limits  CBG MONITORING, ED - Abnormal; Notable for the following components:   Glucose-Capillary 180 (*)    All other components within normal limits  LIPASE, BLOOD  HEPATITIS B SURFACE ANTIBODY, QUANTITATIVE  HEPATITIS B SURFACE ANTIGEN    EKG: EKG Interpretation Date/Time:  Saturday June 08 2024 08:51:44 EST Ventricular Rate:  68 PR Interval:  54 QRS Duration:  148 QT Interval:  435 QTC Calculation: 463 R Axis:   5  Text Interpretation: Sinus rhythm Atrial premature complex Short PR interval Left bundle branch block Confirmed by Neysa Clap [phone]) on 06/08/2024 10:33:32 AM  Radiology: [facility] Chest Portable 1 View Result Date: 06/08/2024 CLINICAL DATA:  Shortness of breath. EXAM: PORTABLE CHEST 1 VIEW COMPARISON:  05/09/2023 FINDINGS: The cardio pericardial silhouette is enlarged. Vascular congestion with diffuse interstitial opacity suggests edema. Left base collapse/consolidation with moderate left pleural effusion. Hazy opacity at the right base suggests layering effusion. Telemetry leads overlie the chest. IMPRESSION: 1.  Enlargement of the cardiopericardial silhouette with vascular congestion and diffuse interstitial opacity suggesting edema. 2. Left base collapse/consolidation with moderate left pleural effusion. 3. Layering small right effusion. Electronically Signed   By: Camellia Candle M.D.   On: 06/08/2024 09:53     .Critical Care  Performed by: Taryll Reichenberger, PA-C Authorized by: Tayna Smethurst, PA-C   Critical care provider statement:    Critical care time (minutes):  45   Critical care start time:  06/08/2024 9:30 AM   Critical care end time:  06/08/2024 10:15 AM   Critical care was necessary to treat or prevent imminent or life-threatening deterioration of the following conditions:  Metabolic crisis   Critical care was time spent personally by me on the following activities:  Examination of patient, discussions with consultants and evaluation of patient's response to treatment   Care discussed with: admitting provider      Medications Ordered in the ED  Chlorhexidine  Gluconate Cloth 2 % PADS 6 each (has no administration in time range)  sodium zirconium cyclosilicate  (LOKELMA ) packet 10 g (10 g Oral Given 06/08/24 1044)  calcium  gluconate 1 g/ 50 mL sodium chloride  IVPB (0 mg Intravenous Stopped 06/08/24 1108)  albuterol  (PROVENTIL ) (2.5 MG/3ML) 0.083% nebulizer solution 10 mg (10 mg Nebulization Given 06/08/24 1041)  insulin  aspart (novoLOG ) injection 5 Units (5 Units Intravenous Given 06/08/24 1041)    And  dextrose  50 % solution 50 mL (50 mLs Intravenous Given 06/08/24 1041)    Clinical Course as of 06/08/24 1257  Sat Jun 08, 2024  1010 Potassium(!!): >7.5 [JS]    Clinical Course User Index [JS] Namiah Dunnavant, PA-C                                 Medical Decision Making Amount and/or Complexity of Data Reviewed Labs: ordered. Decision-making details documented in ED Course. Radiology: ordered. ECG/medicine tests: ordered.  Risk OTC drugs. Prescription drug management.     This  patient presents to the ED for concern of shortness of breath, this involves a number  of treatment options, and is a complaint that carries with it a high risk of complications and morbidity.  The differential diagnosis includes fluid overload, infection versus pulmonary embolism   Co morbidities: Discussed in HPI   Brief History:  SEE HPI.   EMR reviewed including pt PMHx, past surgical history and past visits to ER.   See HPI for more details   Lab Tests:  I ordered and independently interpreted labs.  The pertinent results include:    CBC with slight leukocytosis of 12.  Hemoconcentrated underlying history of tobacco use.  CMP remarkable for a potassium greater than 7.5.  Creatinine level is greatly elevated at 11.36 and an increase in his BUN.  Although he is mentating well.  Lipase level is normal.  Imaging Studies:  DG chest showed: 1. Enlargement of the cardiopericardial silhouette with vascular  congestion and diffuse interstitial opacity suggesting edema.  2. Left base collapse/consolidation with moderate left pleural  effusion.  3. Layering small right effusion.     Cardiac Monitoring:  The patient was maintained on a cardiac monitor.  I personally viewed and interpreted the cardiac monitored which showed an underlying rhythm of: NSR, no changes consistent with hyperkalemia. EKG non-ischemic   Medicines ordered:  I ordered medication including calcium  gluconate, Lokelma , insulin , dextrose  for hyperkalemia Reevaluation of the patient after these medicines showed that the patient improved I have reviewed the patients home medicines and have made adjustments as needed  Critical Interventions:        Hyperkalemia addressed with multiple medications given.   Consults:  I requested consultation with Nephrology,  and discussed lab and imaging findings as well as pertinent plan - they recommend: Will place patient on list in order to have  dialysis.   Reevaluation:  After the interventions noted above I re-evaluated patient and found that they have :stayed the same  Social Determinants of Health:        Dialysis patient with a history of substance abuse and tobacco use.   Problem List / ED Course:  Patient here with a chief complaint of shortness of breath this been ongoing for the last couple of days, he is a dialysis patient Tuesday, Thursday, Saturday.  Reports he has missed multiple sessions of dialysis, states that his last 1 was Tuesday however he is unsure whether he finished the entire course.  CBC with a slight leukocytosis.  Hemoconcentrated underlying history of tobacco use.  CMP with electrolyte derangements consistent with hyperkalemia potassium greater than 7.5, BUN is also elevated along with his creatinine.  Suspect likely due to his noncompliance with dialysis.  Will address hyperkalemia with calcium  gluconate, albuterol , insulin , Lokelma .  Call was placed to nephrology in order to obtain further recommendations. Chest x-ray is remarkable for some pleural effusions, consistent with likely fluid overload as he appears so on exam.  I do feel like he warrants emergent dialysis at this time. Patient received medication to address his hyperkalemia.  I discussed this case with Dr. Conda who is agreeable of having patient dialyzed today and then emergent basis based on his lab findings.  Patient will likely be stable for discharge after dialysis.  Dispostion:  After consideration of the diagnostic results and the patients response to treatment, I feel that the patent would benefit from dialysis during their hospital stay.    Portions of this note were generated with Scientist, clinical (histocompatibility and immunogenetics). Dictation errors may occur despite best attempts at proofreading.   Final diagnoses:  Shortness of breath  Hyperkalemia    ED Discharge Orders          Ordered    APIXABAN  (ELIQUIS ) VTE STARTER PACK (10MG  AND 5MG )   Status:  Discontinued       Note to Pharmacy: If starter pack unavailable, substitute with seventy-four 5 mg apixaban  tabs following the above SIG directions.   06/08/24 1127               Tayanna Talford, PA-C 06/08/24 1257    Neysa Caron PARAS, DO 06/08/24 1536

## 2024-06-08 NOTE — Procedures (Signed)
 Asked to see this patient for hospital dialysis. Pt on HD TTS, missed HD this past Thursday. Here for SOB. Has home O2 prn for COPD. K+ came back at > 7.5.  EKG ok, bun 52, creat 11.3.  CO2 24, AG 17.  WBC 12K.  S/P  temporizing medications in the ED. Pt seen in HD unit. C/o tailbone pains, asking for some pain medicine. Says he won't be driving himself after this (his daughter will take him home). Will order some vicodin po.    The plan will be for ED HD. Pt is not to be admitted at this time. Pt will go to the dialysis unit when they are ready for the patient. When dialysis is completed pt will be sent back to ED for reassessment.   Vitals:   06/08/24 0841 06/08/24 0852  BP: (!) 138/114   Pulse: 68   Resp: 16   Temp: 98.2 F (36.8 C)   TempSrc: Oral   SpO2: 97%   Weight:  93 kg  Height:  6' 2 (1.88 m)   OP HD: TTS G-O  4h  82.5kg  B425   AVF  Heparin  3000    I was present at the procedure, reviewed the HD regimen and made appropriate changes.   Myer Fret MD  [facility] 06/08/2024, 11:26 AM    Recent Labs  Lab 06/08/24 0845  HGB 12.8*  ALBUMIN 3.2*  CALCIUM  8.7*  CREATININE 11.36*  K >7.5*   No results for input(s): IRON , TIBC, FERRITIN in the last 168 hours. Inpatient medications:

## 2024-06-08 NOTE — H&P (Addendum)
 History and Physical    [employer] Sr. FMW:[id number] DOB: Aug 17, 1960 DOA: 06/08/2024  PCP: [name], MD  Patient coming from: Home  I have personally briefly reviewed patient's old medical records in [facility]  Chief Complaint: Shortness of breath and drop in oxygen  saturation  HPI: [name] Caras Sr. is a 63 y.o. male with medical history significant of ESRD on hemodialysis (TTS), COPD, not on oxygen , tobacco abuse, dementia, hypertension, history of stroke, type 2 diabetes, history of alcohol  abuse, peripheral neuropathy present here for shortness of breath.  History gathered from patient's daughters at the bedside.  They report that patient has having difficulty breathing since November.  He is not on oxygen  at home.  He missed his last dialysis appointment due to having loose stool since 4 to 5 days.  Has chronic cough with clear sputum associated with worsening shortness of breath and wheezing for the past couple of days.  No recent sick contact.  No fever, chills, headache, body ache, sore throat.  Has constipation therefore he took Dulcolax which caused him loose stools.  Family is concerned about his breathing and dark loose stools.  No abdominal pain, fever, chills, nausea, vomiting, over-the-counter NSAID use.  Family thinks that he needs to be on oxygen  at home.  He continues to smoke half a pack to 1 pack of cigarette per day.  He was with his mom.  He denies any illicit drug use.  Of note: Patient was supposed to be discharged after dialysis however due to worsening shortness of breath and drop in oxygen  saturation EDP requested admission.  ED Course: Upon arrival: Patient afebrile, pulse 68, RR: 16, BP 138/114, currently on 6 L of oxygen  via nasal cannula.  CBC shows WBC of 12.0, H&H 12.8/39.9, PLT 429.  NA: 126, K: 7.5, BUN 51, CR: 11.36, ALP: 326, T. bili: 1.3, AG: 17.  Chest x-ray shows enlargement of cardiopericardial silhouette with vascular congestion and  diffuse interstitial opacities suggesting edema.  Left base collapse/consolidation with moderate left pleural effusion.  He underwent dialysis in ED.  His oxygen  saturation continued to drop.  Patient was given breathing treatment and methylprednisone 125 mg IV once in the ER.  [facility] hospitalist consulted for admission for acute hypoxemic respiratory failure in the setting of COPD exacerbation.  Review of Systems: As per HPI otherwise negative.    Past Medical History:  Diagnosis Date   Acute renal failure superimposed on stage 2 chronic kidney disease 06/12/2018   Alcohol  abuse 10/18/2017   Allergy, unspecified, initial encounter 03/26/2021   Anaphylactic shock, unspecified, initial encounter 03/26/2021   Anemia in chronic kidney disease 03/26/2021   Anxiety and depression 10/06/2017   Arthritis    Atherosclerotic heart disease of native coronary artery without angina pectoris 03/26/2021   Bilateral lower extremity edema 06/11/2018   Bradycardia with 41-50 beats per minute 05/05/2021   CAD (coronary artery disease) 10/06/2017   CHF (congestive heart failure) (HCC)    CKD (chronic kidney disease) 07/20/2018   CKD (chronic kidney disease) stage 5, GFR less than 15 ml/min (HCC)    dialysis T/Th/Sa   Complication of vascular dialysis catheter 03/26/2021   COPD (chronic obstructive pulmonary disease) (HCC) 09/29/2017   Coronary artery calcification seen on CT scan 10/09/2017   Cough    Diarrhea, unspecified 03/26/2021   Disorder of phosphorus metabolism, unspecified 07/28/2021   Dyspnea    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   Edema 07/20/2018   ESRD (end stage renal disease) (HCC)  T[facility]SAT  Geronimo Car   Fever, unspecified 03/26/2021   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 Tremors whebn he moves it.   Shortness of breath 03/26/2021   Tobacco abuse 09/29/2017   Type 2 diabetes mellitus (HCC) 10/17/2011   diet controlled - per pt does not check CBG at home- insurance did not approve - 08/13/21   Type 2 diabetes mellitus with diabetic peripheral angiopathy without gangrene (HCC) 03/26/2021   Type 2 diabetes mellitus with other diabetic kidney complication (HCC) 03/26/2021    Past Surgical History:  Procedure Laterality Date   A/V FISTULAGRAM Right 12/06/2021   Procedure: A/V Fistulagram;  Surgeon: Sheree Penne Bruckner, MD;  Location: [facility] INVASIVE CV LAB;  Service: Cardiovascular;  Laterality: Right;   A/V FISTULAGRAM Right 05/17/2024   Procedure: A/V Fistulagram;  Surgeon: Gretta Bruckner PARAS, MD;  Location: [facility] PV LAB;  Service: Cardiovascular;  Laterality: Right;   ANTERIOR CERVICAL DECOMP/DISCECTOMY FUSION N/A 09/14/2017   Procedure: ANTERIOR CERVICAL DECOMPRESSION/DISCECTOMY FUSIONCERVICAL 4- CERVICAL 5, CERVICAL 5- CERVICAL 6;  Surgeon: Onetha Kuba, MD;  Location: [facility] OR;  Service: Neurosurgery;  Laterality: N/A;  ANTERIOR CERVICAL DECOMPRESSION/DISCECTOMY FUSIONCERVICAL 4- CERVICAL 5, CERVICAL 5- CERVICAL 6   AV FISTULA PLACEMENT Left 03/05/2021   Procedure: LEFT UPPER EXTREMITY RADIOBASILIC  ARTERIOVENOUS (AV) FISTULA CREATION;  Surgeon: Magda Debby SAILOR, MD;  Location: [facility] OR;  Service: Vascular;  Laterality: Left;  PERIPHERAL NERVE BLOCK   AV FISTULA PLACEMENT Left 04/21/2021   Procedure: INSERTION OF  LEFT ARM ARTERIOVENOUS (AV) GORE-TEX GRAFT;  Surgeon: Magda Debby SAILOR, MD;  Location: [facility] OR;  Service: Vascular;  Laterality: Left;  PERIPHERAL NERVE BLOCK   AV FISTULA PLACEMENT Right  06/30/2021   Procedure: RIGHT ARM ARTERIOVENOUS (AV) FISTULA;  Surgeon: Magda Debby SAILOR, MD;  Location: [facility] OR;  Service: Vascular;  Laterality: Right;  PERIPHERAL NERVE BLOCK   BASCILIC VEIN TRANSPOSITION Right 08/16/2021   Procedure: RIGHT ARM SECOND STAGE BASILIC VEIN TRANSPOSITION;  Surgeon: Magda Debby SAILOR, MD;  Location: [facility] OR;  Service: Vascular;  Laterality: Right;  PERIPHERAL NERVE BLOCK, LOCAL GIVEN   IR FLUORO GUIDE CV LINE LEFT  03/25/2021   PERIPHERAL VASCULAR BALLOON ANGIOPLASTY Right 12/06/2021   Procedure: PERIPHERAL VASCULAR BALLOON ANGIOPLASTY;  Surgeon: Sheree Penne Bruckner, MD;  Location: [facility] INVASIVE CV LAB;  Service: Cardiovascular;  Laterality: Right;   UPPER EXTREMITY VENOGRAPHY Bilateral 06/04/2021   Procedure: CENTRAL  AND UPPER VENOGRAPHY;  Surgeon: Magda Debby SAILOR, MD;  Location: [facility] INVASIVE CV LAB;  Service: Cardiovascular;  Laterality: Bilateral;   VENOUS ANGIOPLASTY  05/17/2024   Procedure: VENOUS ANGIOPLASTY;  Surgeon: Gretta Bruckner PARAS, MD;  Location: [facility] PV LAB;  Service: Cardiovascular;;     reports that he has been smoking cigarettes. He has a 63 pack-year smoking history. He has never used smokeless tobacco. He reports that he does not currently use alcohol  after a past usage of about 10.0 standard drinks of alcohol  per week. He reports current drug use. Drug: Marijuana.  Allergies[1]  Family History  Problem Relation Age of Onset  Diabetes Mother    Scoliosis Mother    Kidney cancer Father    Lung cancer Father    Hypertension Brother    Colon cancer Neg Hx    Pancreatic cancer Neg Hx    Prostate cancer Neg Hx    Rectal cancer Neg Hx    Stomach cancer Neg Hx     Prior to Admission medications  Medication Sig Start Date End Date Taking? Authorizing Provider  albuterol  (VENTOLIN  HFA) 108 (90 Base) MCG/ACT inhaler Inhale 2 puffs into the lungs every 6 (six) hours as needed for COPD. 04/06/24  Yes Vicci Barnie NOVAK, MD  amLODipine  (NORVASC ) 5  MG tablet Take 5 mg by mouth daily.   Yes [provider]  AURYXIA  1 GM 210 MG(Fe) tablet Take 630 mg by mouth 2 (two) times daily. and one with snacks 04/15/21  Yes [provider]  carvedilol  (COREG ) 6.25 MG tablet Take 1 tablet (6.25 mg total) by mouth 2 (two) times daily. 08/21/23  Yes Newlin, Enobong, MD  citalopram  (CELEXA ) 40 MG tablet Take 1 tablet (40 mg total) by mouth daily.Must have office visit for refills 08/21/23  Yes Newlin, Enobong, MD  dicyclomine  (BENTYL ) 10 MG capsule Take 1 capsule (10 mg total) by mouth 3 (three) times daily as needed for spasms (Diarrhea). 04/06/24  Yes Vicci Barnie NOVAK, MD  fluticasone -salmeterol (ADVAIR ) 100-50 MCG/ACT AEPB Inhale 1 puff into the lungs 2 (two) times daily. 08/21/23  Yes Newlin, Enobong, MD  levETIRAcetam  (KEPPRA ) 500 MG tablet Take 1 tablet (500 mg total) by mouth 2 (two) times daily. 04/06/24  Yes Vicci Barnie NOVAK, MD  pregabalin  (LYRICA ) 25 MG capsule Take 1 capsule (25 mg total) by mouth at bedtime. 04/06/24  Yes Vicci Barnie NOVAK, MD  rosuvastatin  (CRESTOR ) 10 MG tablet Take 1 tablet (10 mg total) by mouth daily. 08/21/23  Yes Newlin, Enobong, MD  sodium zirconium cyclosilicate  (LOKELMA ) 10 g PACK packet Mix 10 g (1 packet) according to packet instructions and take by mouth every Wednesday and Saturday. 06/07/23  Yes   traZODone  (DESYREL ) 50 MG tablet Take 1 tablet (50 mg total) by mouth at bedtime. 04/06/24  Yes Vicci Barnie NOVAK, MD  Accu-Chek Softclix Lancets lancets Use as instructed tid before meals. Dx E11.22 12/18/23   Newlin, Enobong, MD  Blood Glucose Monitoring Suppl (ACCU-CHEK GUIDE) w/Device KIT Use as directed 3 times daily. 12/18/23   Newlin, Enobong, MD  cyclobenzaprine  (FLEXERIL ) 5 MG tablet Take 1 tablet (5 mg total) by mouth daily as needed for muscle spasms. After dialysis on Tuesday, Thursdays and Saturdays Patient not taking: Reported on 06/08/2024 12/18/23   Newlin, Enobong, MD  glucose blood  (ACCU-CHEK GUIDE TEST) test strip Use as instructed 3 times daily. Dx E11.22 12/18/23   Newlin, Enobong, MD  glucose blood (ACCU-CHEK GUIDE) test strip Use as instructed 3 times daily. Dx E11.22 08/15/22   Newlin, Enobong, MD  hydrOXYzine  (ATARAX ) 25 MG tablet Take 1 tablet (25 mg total) by mouth 3 (three) times daily as needed for anxiety.Must have office visit for refills. Patient not taking: Reported on 06/08/2024 08/21/23   [name], MD    Physical Exam: Vitals:   06/08/24 1830 06/08/24 1835 06/08/24 1913 06/08/24 1916  BP: 122/72   104/68  Pulse: 86   (!) 103  Resp: 18   (!) 24  Temp:  98.5 F (36.9 C)    TempSrc:  Oral    SpO2: 90%  (!) 89% (!) 88%  Weight:  Height:        Constitutional: In mild respiratory distress.  Has nasal cannula.  Family at the bedside. Eyes: PERRL, lids and conjunctivae normal ENMT: Mucous membranes are moist. Posterior pharynx clear of any exudate or lesions.Normal dentition.  Neck: normal, supple, no masses, no thyromegaly Respiratory: Bilateral diffuse expiratory wheezing positive Cardiovascular: Systolic murmur noted.  No rubs or gallop.  Bilateral 2+ pitting edema positive Abdomen: no tenderness, no masses palpated. No hepatosplenomegaly. Bowel sounds positive.  Musculoskeletal: no clubbing / cyanosis. No joint deformity upper and lower extremities. Good ROM, no contractures. Normal muscle tone.  Skin: no rashes, lesions, ulcers. No induration Neurologic: Alert and oriented.  Communicating well.  Following commands.   Psychiatric: Normal judgment and insight. Alert and oriented x 3. Normal mood.    Labs on Admission: I have personally reviewed following labs and imaging studies  CBC: Recent Labs  Lab 06/08/24 0845  WBC 12.0*  NEUTROABS 8.8*  HGB 12.8*  HCT 39.9  MCV 84.2  PLT 429*   Basic Metabolic Panel: Recent Labs  Lab 06/08/24 0845  NA 126*  K >7.5*  CL 85*  CO2 24  GLUCOSE 137*  BUN 51*  CREATININE 11.36*   CALCIUM  8.7*   GFR: Estimated Creatinine Clearance: 7.5 mL/min (A) (by C-G formula based on SCr of 11.36 mg/dL (H)). Liver Function Tests: Recent Labs  Lab 06/08/24 0845  AST 25  ALT 29  ALKPHOS 326*  BILITOT 1.3*  PROT 7.0  ALBUMIN 3.2*   Recent Labs  Lab 06/08/24 0845  LIPASE 28   No results for input(s): AMMONIA in the last 168 hours. Coagulation Profile: No results for input(s): INR, PROTIME in the last 168 hours. Cardiac Enzymes: No results for input(s): CKTOTAL, CKMB, CKMBINDEX, TROPONINI in the last 168 hours. BNP (last 3 results) No results for input(s): PROBNP in the last 8760 hours. HbA1C: No results for input(s): HGBA1C in the last 72 hours. CBG: Recent Labs  Lab 06/08/24 1101  GLUCAP 180*   Lipid Profile: No results for input(s): CHOL, HDL, LDLCALC, TRIG, CHOLHDL, LDLDIRECT in the last 72 hours. Thyroid  Function Tests: No results for input(s): TSH, T4TOTAL, FREET4, T3FREE, THYROIDAB in the last 72 hours. Anemia Panel: No results for input(s): VITAMINB12, FOLATE, FERRITIN, TIBC, IRON , RETICCTPCT in the last 72 hours. Urine analysis:    Component Value Date/Time   COLORURINE YELLOW 06/12/2018 1558   APPEARANCEUR CLEAR 06/12/2018 1558   LABSPEC 1.009 06/12/2018 1558   PHURINE 5.0 06/12/2018 1558   GLUCOSEU 50 (A) 06/12/2018 1558   HGBUR MODERATE (A) 06/12/2018 1558   BILIRUBINUR NEGATIVE 06/12/2018 1558   BILIRUBINUR neg 01/11/2018 1459   KETONESUR NEGATIVE 06/12/2018 1558   PROTEINUR 100 (A) 06/12/2018 1558   UROBILINOGEN 0.2 01/11/2018 1459   NITRITE NEGATIVE 06/12/2018 1558   LEUKOCYTESUR NEGATIVE 06/12/2018 1558    Radiological Exams on Admission: DG Chest Portable 1 View Result Date: 06/08/2024 CLINICAL DATA:  Shortness of breath. EXAM: PORTABLE CHEST 1 VIEW COMPARISON:  05/09/2023 FINDINGS: The cardio pericardial silhouette is enlarged. Vascular congestion with diffuse interstitial  opacity suggests edema. Left base collapse/consolidation with moderate left pleural effusion. Hazy opacity at the right base suggests layering effusion. Telemetry leads overlie the chest. IMPRESSION: 1. Enlargement of the cardiopericardial silhouette with vascular congestion and diffuse interstitial opacity suggesting edema. 2. Left base collapse/consolidation with moderate left pleural effusion. 3. Layering small right effusion. Electronically Signed   By: Camellia Candle M.D.   On: 06/08/2024 09:53    EKG:  Independently reviewed.  Atrial flutter.  Assessment/Plan  Acute hypoxemic respiratory failure in the setting of COPD exacerbation and pulmonary edema in the setting of missed dialysis: - Patient has history of COPD.  Current everyday smoker.  Not on oxygen  at home.  Currently on 6 L of oxygen  via nasal cannula. - Reviewed chest x-ray.  Will get COVID flu RSV. - Admit patient on the floor. - Given methylprednisone in ED.  Will continue same.  Add doxycycline . - DuoNebs scheduled as well as as needed.  On continuous pulse ox.  Will try to wean off of oxygen  as tolerated - Check a sputum culture.  Monitor vitals.  ESRD on dialysis: - Missed dialysis due to not feeling well. -Underwent dialysis in ED. - Nephrology on board-appreciate help  Hyperkalemia: - Repeat BMP shows potassium of 8.2 after dialysis?.  Will repeat BMP stat. - Check magnesium.  Loose stools: -Likely due to stool softener-Dulcolax. -Will hold off stool studies at this time.  No recent antibiotic use or travel.  Hypertension: Blood pressure is currently stable - Will continue to monitor.  Resume home BP meds.-Coreg  and amlodipine   Hyponatremia: -Likely in the setting of hypervolemia due to missed dialysis appointment - Underwent dialysis.  Will monitor closely  History of stroke: - Patient is at baseline.  Chronic combined CHF: - Bilateral 2+ pitting edema noted on exam.  Reviewed chest x-ray. - Monitor signs  for fluid overload. BNP significantly elevated.-Echo done 05/10/2023 that showed EF of 40 to 45% with grade 2 diastolic dysfunction.  Will repeat echocardiogram.  History of dementia: -History of recurrent falls per patient's family.   - On fall precautions.  Diet controlled diabetes: - Last A1c 6.1%.  On Lyrica  for neuropathy - Repeat A1c.  Hyperlipidemia: Continue statin  Depression/anxiety/insomnia: - Continue Celexa  and trazodone   Anemia of chronic disease: - In the setting of ESRD. - H&H is stable.  Continue to monitor  Thrombocytosis: -Likely reactive.  Continue to monitor  History of seizures: Will continue current dose of Keppra .  Abnormal EKG - EKG shows atrial fibrillation.  Heart rate in 120s. - Consulted cardiology. - Will get troponin and ABGs.   - Cardiology recommended start heparin  GTT, switch Coreg  to metoprolol  12.5 every 6 over.  N.p.o. after midnight for possible TEE/cardioversion  DVT prophylaxis: Heparin  Code Status: Full code Family Communication: Patient's twin daughters present at bedside.  Plan of care discussed with patient in length and he verbalized understanding and agreed with it. Disposition Plan: To be determined Consults called: Nephrology and Cardiology Admission status: Inpatient   Velna JONELLE Skeeter MD [facility]  If 7PM-7AM, please contact night-coverage [email or web address]  06/08/2024, 7:47 PM        [1] No Known Allergies

## 2024-06-08 NOTE — Consult Note (Addendum)
 Cardiology Consultation   Patient ID: [name] Coby Sr. MRN: [id number]; DOB: [birth year]  Admit date: 06/08/2024 Date of Consult: 06/08/2024  PCP:  [name], MD   [facility] Providers Cardiologist:  None      Patient Profile: [name] Crisp Sr. is a 62 y.o. male with a hx of ESRD on hemodialysis (TTS), COPD, not on oxygen , tobacco abuse, dementia, hypertension, history of stroke, type 2 diabetes, history of alcohol  abuse, peripheral neuropathy  who is being seen 06/08/2024 for the evaluation of new onset atrial fibrillation at the request of internal medicine.  History of Present Illness: [name] [name] initially presented with shortness of breath and new oxygen  requirement.  He was initially admitted to general internal medicine who consulted cardiology given new onset atrial fibrillation in the ED.  Prior to consulting cardiology patient was started on heparin  gtt. On my assessment, patient noted that he has not had issues with A-fib or flutter as far as he can recall.  He denies history of major bleeding or stroke.  In the ED his blood pressure is 111/80 with heart rate 121 and A-fib.   Past Medical History:  Diagnosis Date   Acute renal failure superimposed on stage 2 chronic kidney disease 06/12/2018   Alcohol  abuse 10/18/2017   Allergy, unspecified, initial encounter 03/26/2021   Anaphylactic shock, unspecified, initial encounter 03/26/2021   Anemia in chronic kidney disease 03/26/2021   Anxiety and depression 10/06/2017   Arthritis    Atherosclerotic heart disease of native coronary artery without angina pectoris 03/26/2021   Bilateral lower extremity edema 06/11/2018   Bradycardia with 41-50 beats per minute 05/05/2021   CAD (coronary artery disease) 10/06/2017   CHF (congestive heart failure) (HCC)    CKD (chronic kidney disease) 07/20/2018   CKD (chronic kidney disease) stage 5, GFR less than 15 ml/min (HCC)    dialysis T/Th/Sa   Complication of vascular  dialysis catheter 03/26/2021   COPD (chronic obstructive pulmonary disease) (HCC) 09/29/2017   Coronary artery calcification seen on CT scan 10/09/2017   Cough    Diarrhea, unspecified 03/26/2021   Disorder of phosphorus metabolism, unspecified 07/28/2021   Dyspnea    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   Edema 07/20/2018   ESRD (end stage renal disease) (HCC)    TTHSAT  Geronimo Car   Fever, unspecified 03/26/2021   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 Tremors whebn he moves it.   Shortness of breath 03/26/2021   Tobacco abuse 09/29/2017   Type 2 diabetes mellitus (HCC) 10/17/2011   diet controlled - per pt does not check CBG at home- insurance did not approve - 08/13/21   Type 2 diabetes mellitus with diabetic peripheral angiopathy without gangrene (HCC) 03/26/2021   Type 2 diabetes mellitus with other diabetic kidney complication (HCC) 03/26/2021    Past Surgical History:  Procedure Laterality Date   A/V FISTULAGRAM Right 12/06/2021   Procedure: A/V Fistulagram;  Surgeon: Sheree Penne Bruckner, MD;  Location[facility] INVASIVE CV LAB;  Service: Cardiovascular;  Laterality: Right;   A/V FISTULAGRAM Right 05/17/2024  Procedure: A/V Fistulagram;  Surgeon: Gretta Lonni PARAS, MD;  Location: [facility] PV LAB;  Service: Cardiovascular;  Laterality: Right;   ANTERIOR CERVICAL DECOMP/DISCECTOMY FUSION N/A 09/14/2017   Procedure: ANTERIOR CERVICAL DECOMPRESSION/DISCECTOMY FUSIONCERVICAL 4- CERVICAL 5, CERVICAL 5- CERVICAL 6;   Surgeon: Onetha Kuba, MD;  Location: [facility] OR;  Service: Neurosurgery;  Laterality: N/A;  ANTERIOR CERVICAL DECOMPRESSION/DISCECTOMY FUSIONCERVICAL 4- CERVICAL 5, CERVICAL 5- CERVICAL 6   AV FISTULA PLACEMENT Left 03/05/2021   Procedure: LEFT UPPER EXTREMITY RADIOBASILIC  ARTERIOVENOUS (AV) FISTULA CREATION;  Surgeon: Magda Debby SAILOR, MD;  Location: [facility] OR;  Service: Vascular;  Laterality: Left;  PERIPHERAL NERVE BLOCK   AV FISTULA PLACEMENT Left 04/21/2021   Procedure: INSERTION OF  LEFT ARM ARTERIOVENOUS (AV) GORE-TEX GRAFT;  Surgeon: Magda Debby SAILOR, MD;  Location: [facility] OR;  Service: Vascular;  Laterality: Left;  PERIPHERAL NERVE BLOCK   AV FISTULA PLACEMENT Right 06/30/2021   Procedure: RIGHT ARM ARTERIOVENOUS (AV) FISTULA;  Surgeon: Magda Debby SAILOR, MD;  Location: [facility] OR;  Service: Vascular;  Laterality: Right;  PERIPHERAL NERVE BLOCK   BASCILIC VEIN TRANSPOSITION Right 08/16/2021   Procedure: RIGHT ARM SECOND STAGE BASILIC VEIN TRANSPOSITION;  Surgeon: Magda Debby SAILOR, MD;  Location: [facility] OR;  Service: Vascular;  Laterality: Right;  PERIPHERAL NERVE BLOCK, LOCAL GIVEN   IR FLUORO GUIDE CV LINE LEFT  03/25/2021   PERIPHERAL VASCULAR BALLOON ANGIOPLASTY Right 12/06/2021   Procedure: PERIPHERAL VASCULAR BALLOON ANGIOPLASTY;  Surgeon: Sheree Penne Lonni, MD;  Location: [facility] INVASIVE CV LAB;  Service: Cardiovascular;  Laterality: Right;   UPPER EXTREMITY VENOGRAPHY Bilateral 06/04/2021   Procedure: CENTRAL  AND UPPER VENOGRAPHY;  Surgeon: Magda Debby SAILOR, MD;  Location: [facility] INVASIVE CV LAB;  Service: Cardiovascular;  Laterality: Bilateral;   VENOUS ANGIOPLASTY  05/17/2024   Procedure: VENOUS ANGIOPLASTY;  Surgeon: Gretta Lonni PARAS, MD;  Location: [facility] PV LAB;  Service: Cardiovascular;;     Home Medications:  Prior to Admission medications  Medication Sig Start Date End Date Taking? Authorizing Provider  albuterol  (VENTOLIN  HFA) 108 (90 Base) MCG/ACT inhaler Inhale 2 puffs into the lungs every 6 (six)  hours as needed for COPD. 04/06/24  Yes Vicci Barnie NOVAK, MD  amLODipine  (NORVASC ) 5 MG tablet Take 5 mg by mouth daily.   Yes [provider]  AURYXIA  1 GM 210 MG(Fe) tablet Take 630 mg by mouth 2 (two) times daily. and one with snacks 04/15/21  Yes [provider]  carvedilol  (COREG ) 6.25 MG tablet Take 1 tablet (6.25 mg total) by mouth 2 (two) times daily. 08/21/23  Yes Newlin, Enobong, MD  citalopram  (CELEXA ) 40 MG tablet Take 1 tablet (40 mg total) by mouth daily.Must have office visit for refills 08/21/23  Yes [name], MD  dicyclomine  (BENTYL ) 10 MG capsule Take 1 capsule (10 mg total) by mouth 3 (three) times daily as needed for spasms (Diarrhea). 04/06/24  Yes Vicci Barnie NOVAK, MD  fluticasone -salmeterol (ADVAIR ) 100-50 MCG/ACT AEPB Inhale 1 puff into the lungs 2 (two) times daily. 08/21/23  Yes Newlin, Enobong, MD  levETIRAcetam  (KEPPRA ) 500 MG tablet Take 1 tablet (500 mg total) by mouth 2 (two) times daily. 04/06/24  Yes Vicci Barnie NOVAK, MD  pregabalin  (LYRICA ) 25 MG capsule Take 1 capsule (25 mg total) by mouth at bedtime. 04/06/24  Yes Vicci Barnie NOVAK, MD  rosuvastatin  (CRESTOR ) 10 MG tablet Take 1 tablet (10 mg total) by mouth daily. 08/21/23  Yes Newlin, Enobong, MD  sodium zirconium cyclosilicate  (LOKELMA ) 10 g PACK packet Mix  10 g (1 packet) according to packet instructions and take by mouth every Wednesday and Saturday. 06/07/23  Yes   traZODone  (DESYREL ) 50 MG tablet Take 1 tablet (50 mg total) by mouth at bedtime. 04/06/24  Yes Vicci Barnie NOVAK, MD  Accu-Chek Softclix Lancets lancets Use as instructed tid before meals. Dx E11.22 12/18/23   Newlin, Enobong, MD  Blood Glucose Monitoring Suppl (ACCU-CHEK GUIDE) w/Device KIT Use as directed 3 times daily. 12/18/23   Newlin, Enobong, MD  cyclobenzaprine  (FLEXERIL ) 5 MG tablet Take 1 tablet (5 mg total) by mouth daily as needed for muscle spasms. After dialysis on Tuesday, Thursdays and Saturdays Patient  not taking: Reported on 06/08/2024 12/18/23   Newlin, Enobong, MD  glucose blood (ACCU-CHEK GUIDE TEST) test strip Use as instructed 3 times daily. Dx E11.22 12/18/23   Newlin, Enobong, MD  glucose blood (ACCU-CHEK GUIDE) test strip Use as instructed 3 times daily. Dx E11.22 08/15/22   Newlin, Enobong, MD  hydrOXYzine  (ATARAX ) 25 MG tablet Take 1 tablet (25 mg total) by mouth 3 (three) times daily as needed for anxiety.Must have office visit for refills. Patient not taking: Reported on 06/08/2024 08/21/23   Newlin, Enobong, MD    Scheduled Meds:  amLODipine   5 mg Oral Daily   carvedilol   6.25 mg Oral BID   Chlorhexidine  Gluconate Cloth  6 each Topical Q0600   [START ON 06/09/2024] citalopram   40 mg Oral Daily   doxycycline   100 mg Oral Q12H   [START ON 06/09/2024] ferric citrate   630 mg Oral BID WC   [START ON 06/09/2024] fluticasone  furoate-vilanterol  1 puff Inhalation Daily   heparin   5,000 Units Subcutaneous Q8H   ipratropium  0.5 mg Nebulization Q6H   levETIRAcetam   500 mg Oral BID   [START ON 06/09/2024] methylPREDNISolone  (SOLU-MEDROL ) injection  60 mg Intravenous Q12H   [START ON 06/09/2024] pregabalin   25 mg Oral QHS   [START ON 06/09/2024] rosuvastatin   10 mg Oral Daily   [START ON 06/09/2024] sodium zirconium cyclosilicate   10 g Oral Once   traZODone   50 mg Oral QHS   Continuous Infusions:  PRN Meds:   Allergies:   Allergies[1]  Social History:   Social History   Socioeconomic History   Marital status: Single    Spouse name: Not on file   Number of children: Not on file   Years of education: Not on file   Highest education level: Not on file  Occupational History   Not on file  Tobacco Use   Smoking status: Every Day    Current packs/day: 1.50    Average packs/day: 1.5 packs/day for 42.0 years (63.0 ttl pk-yrs)    Types: Cigarettes   Smokeless tobacco: Never   Tobacco comments:    1 pack a day   Vaping Use   Vaping status: Never Used  Substance and Sexual  Activity   Alcohol  use: Not Currently    Alcohol /week: 10.0 standard drinks of alcohol     Types: 10 Cans of beer per week    Comment: quit September 2022   Drug use: Yes    Types: Marijuana    Comment: ocassionally   Sexual activity: Not on file  Other Topics Concern   Not on file  Social History Narrative   Not on file   Social Drivers of Health   Tobacco Use: High Risk (12/18/2023)   Patient History    Smoking Tobacco Use: Every Day    Smokeless Tobacco Use: Never  Passive Exposure: Not on file  Financial Resource Strain: High Risk (09/19/2023)   Overall Financial Resource Strain (CARDIA)    Difficulty of Paying Living Expenses: Hard  Food Insecurity: No Food Insecurity (09/19/2023)   Hunger Vital Sign    Worried About Running Out of Food in the Last Year: Never true    Ran Out of Food in the Last Year: Never true  Recent Concern: Food Insecurity - Food Insecurity Present (08/21/2023)   Hunger Vital Sign    Worried About Running Out of Food in the Last Year: Never true    Ran Out of Food in the Last Year: Sometimes true  Transportation Needs: No Transportation Needs (09/19/2023)   PRAPARE - Administrator, Civil Service (Medical): No    Lack of Transportation (Non-Medical): No  Physical Activity: Patient Declined (09/19/2023)   Exercise Vital Sign    Days of Exercise per Week: Patient declined    Minutes of Exercise per Session: Patient declined  Stress: No Stress Concern Present (09/19/2023)   [employer] of Occupational Health - Occupational Stress Questionnaire    Feeling of Stress : Not at all  Social Connections: Socially Isolated (09/19/2023)   Social Connection and Isolation Panel    Frequency of Communication with Friends and Family: Twice a week    Frequency of Social Gatherings with Friends and Family: Twice a week    Attends Religious Services: Never    Database Administrator or Organizations: No    Attends Banker Meetings: Never     Marital Status: Divorced  Catering Manager Violence: Not At Risk (09/19/2023)   Humiliation, Afraid, Rape, and Kick questionnaire    Fear of Current or Ex-Partner: No    Emotionally Abused: No    Physically Abused: No    Sexually Abused: No  Depression (PHQ2-9): Low Risk (09/19/2023)   Depression (PHQ2-9)    PHQ-2 Score: 3  Alcohol  Screen: Low Risk (09/19/2023)   Alcohol  Screen    Last Alcohol  Screening Score (AUDIT): 1  Housing: Low Risk (09/19/2023)   Housing Stability Vital Sign    Unable to Pay for Housing in the Last Year: No    Number of Times Moved in the Last Year: 0    Homeless in the Last Year: No  Utilities: Not At Risk (09/19/2023)   AHC Utilities    Threatened with loss of utilities: No  Health Literacy: Adequate Health Literacy (09/19/2023)   B1300 Health Literacy    Frequency of need for help with medical instructions: Never    Family History:    Family History  Problem Relation Age of Onset   Diabetes Mother    Scoliosis Mother    Kidney cancer Father    Lung cancer Father    Hypertension Brother    Colon cancer Neg Hx    Pancreatic cancer Neg Hx    Prostate cancer Neg Hx    Rectal cancer Neg Hx    Stomach cancer Neg Hx      ROS:  Please see the history of present illness.  All other ROS reviewed and negative.     Physical Exam/Data: Vitals:   06/08/24 2130 06/08/24 2145 06/08/24 2200 06/08/24 2207  BP: 102/76 106/72 111/80 111/80  Pulse: (!) 134 (!) 137 (!) 121   Resp: (!) 24 17 14    Temp:      TempSrc:      SpO2: (!) 89% 92% 93%   Weight:  Height:        Intake/Output Summary (Last 24 hours) at 06/08/2024 2234 Last data filed at 06/08/2024 1700 Gross per 24 hour  Intake 44.51 ml  Output 3600 ml  Net -3555.49 ml      06/08/2024    5:00 PM 06/08/2024    1:01 PM 06/08/2024    8:52 AM  Last 3 Weights  Weight (lbs) -- -- 205 lb 0.4 oz  Weight (kg) -- -- 93 kg     Body mass index is 26.32 kg/m.  General:  Well nourished, well  developed, in no acute distress HEENT: normal Neck: no JVD Vascular: No carotid bruits; Distal pulses 2+ bilaterally Cardiac:  normal S1, S2; RRR; no murmur  Lungs:  clear to auscultation bilaterally, no wheezing, rhonchi or rales  Abd: soft, nontender, no hepatomegaly  Ext: no edema Musculoskeletal:  No deformities, BUE and BLE strength normal and equal Skin: warm and dry  Neuro:  CNs 2-12 intact, no focal abnormalities noted Psych:  Normal affect   EKG:     Relevant CV Studies: TTE 05/10/2023  1. Left ventricular ejection fraction, by estimation, is 40 to 45%. Left  ventricular ejection fraction by 2D MOD biplane is 40.8 %. The left  ventricle has mildly decreased function. The left ventricle demonstrates  global hypokinesis. The left  ventricular internal cavity size was mildly dilated. Left ventricular  diastolic parameters are consistent with Grade II diastolic dysfunction  (pseudonormalization). The average left ventricular global longitudinal  strain is -13.8 %. The global  longitudinal strain is abnormal.   2. Right ventricular systolic function is normal. The right ventricular  size is normal. Tricuspid regurgitation signal is inadequate for assessing  PA pressure.   3. Left atrial size was mildly dilated.   4. A small pericardial effusion is present. The pericardial effusion is  posterior to the left ventricle.   5. The mitral valve is grossly normal. Mild mitral valve regurgitation.  No evidence of mitral stenosis.   6. The aortic valve is tricuspid. Aortic valve regurgitation is not  visualized. No aortic stenosis is present.   7. The inferior vena cava is normal in size with greater than 50%  respiratory variability, suggesting right atrial pressure of 3 mmHg.   Laboratory Data: High Sensitivity Troponin:   Recent Labs  Lab 06/08/24 2024  TROPONINIHS 23*     Chemistry Recent Labs  Lab 06/08/24 0845 06/08/24 1955 06/08/24 2024 06/08/24 2220  NA 126*  133* 131* 126*  K >7.5* 8.2* 5.9* 5.7*  CL 85* 107 87*  --   CO2 24  --  31  --   GLUCOSE 137* 79 111*  --   BUN 51* 30* 28*  --   CREATININE 11.36* 5.30* 7.25*  --   CALCIUM  8.7*  --  9.0  --   MG  --   --  2.6*  --   GFRNONAA 5*  --  8*  --   ANIONGAP 17*  --  13  --     Recent Labs  Lab 06/08/24 0845  PROT 7.0  ALBUMIN 3.2*  AST 25  ALT 29  ALKPHOS 326*  BILITOT 1.3*   Lipids No results for input(s): CHOL, TRIG, HDL, LABVLDL, LDLCALC, CHOLHDL in the last 168 hours.  Hematology Recent Labs  Lab 06/08/24 0845 06/08/24 1955 06/08/24 2024 06/08/24 2220  WBC 12.0*  --  11.2*  --   RBC 4.74  --  4.33  --   HGB 12.8*  8.8* 11.9* 12.2*  HCT 39.9 26.0* 36.5* 36.0*  MCV 84.2  --  84.3  --   MCH 27.0  --  27.5  --   MCHC 32.1  --  32.6  --   RDW 16.6*  --  16.4*  --   PLT 429*  --  416*  --    Thyroid  No results for input(s): TSH, FREET4 in the last 168 hours.  BNP Recent Labs  Lab 06/08/24 1310  BNP 1,475.3*    DDimer No results for input(s): DDIMER in the last 168 hours.  Radiology/Studies:  DG Chest Portable 1 View Result Date: 06/08/2024 CLINICAL DATA:  Shortness of breath. EXAM: PORTABLE CHEST 1 VIEW COMPARISON:  05/09/2023 FINDINGS: The cardio pericardial silhouette is enlarged. Vascular congestion with diffuse interstitial opacity suggests edema. Left base collapse/consolidation with moderate left pleural effusion. Hazy opacity at the right base suggests layering effusion. Telemetry leads overlie the chest. IMPRESSION: 1. Enlargement of the cardiopericardial silhouette with vascular congestion and diffuse interstitial opacity suggesting edema. 2. Left base collapse/consolidation with moderate left pleural effusion. 3. Layering small right effusion. Electronically Signed   By: Camellia Candle M.D.   On: 06/08/2024 09:53   Assessment and Plan: [name] Fricke Sr. is a 63 y.o. male with a hx of ESRD on hemodialysis (TTS), COPD, not on oxygen , tobacco  abuse, dementia, hypertension, history of stroke, type 2 diabetes, history of alcohol  abuse, peripheral neuropathy admitted to general internal medicine for shortness of breath with cardiology consult for new onset atrial fibrillation.  The patient is hemodynamically stable although he does have new oxygen  requirement complains of pain and discomfort throughout his body which is not new for him.  Regarding his atrial fibrillation, he was started on heparin  drip per GEN med and can be transition to apixaban  tomorrow assuming no other interventions.  He takes carvedilol  6.25 mg twice daily for blood pressure.  His rates are in 120s per telemetry review.  # New Onset AF Workup: - EKG+Telemetry - Troponins - TSH, FT4 - EtOH, UTox - CXR - TTE - continue hep gtt for now, can start apixaban  5 mg BID in AM - Hold home carvedilol  - Start metoprolol  tartrate 12.5 mg every 6 hours, can spot dose and increase by 12.5 mg up to 50 mg every 6 hours for rate control goal heart rate less than 110 - For heart rates greater than 160 consider metoprolol  2.5-5mg  IV up to x3 then PO maintenance - NPO at midnight for option of TEEcho + DCCardioversion -Cardiology will continue to follow   Risk Assessment/Risk Scores:       CHA2DS2-VASc Score =   5  This indicates a  % annual risk of stroke. The patient's score is based upon:          For questions or updates, please contact [facility] Please consult [email or web address] for contact info under    Signed, Donley LOISE Devonshire, MD  06/08/2024 10:34 PM     [1] No Known Allergies

## 2024-06-08 NOTE — ED Provider Notes (Addendum)
 Patient feels better after dialysis.  Will be discharged home  7:02 PM At time of discharge patient noted to be more short of breath.  Pulse oximetry dropped to 88% on room air.  Will place on supplemental oxygen .  He was ambulated and dropped to 85%.  He does have a history of COPD.  He did complete his full dialysis treatment.  Suspect patient may have a COPD exacerbation and at review his x-ray he might have some underlying pneumonia.  Will start antibiotics.  Will also give albuterol  treatment along with steroids and admit for observation   Dasie Faden, MD 06/08/24 SHAUNNA    Dasie Faden, MD 06/08/24 1902

## 2024-06-09 ENCOUNTER — Encounter (HOSPITAL_COMMUNITY): Payer: Self-pay | Admitting: Internal Medicine

## 2024-06-09 ENCOUNTER — Inpatient Hospital Stay (HOSPITAL_COMMUNITY)

## 2024-06-09 DIAGNOSIS — I480 Paroxysmal atrial fibrillation: Secondary | ICD-10-CM | POA: Insufficient documentation

## 2024-06-09 LAB — BASIC METABOLIC PANEL WITH GFR
Anion gap: 13 (ref 5–15)
BUN: 32 mg/dL — ABNORMAL HIGH (ref 8–23)
CO2: 26 mmol/L (ref 22–32)
Calcium: 8.7 mg/dL — ABNORMAL LOW (ref 8.9–10.3)
Chloride: 89 mmol/L — ABNORMAL LOW (ref 98–111)
Creatinine, Ser: 7.86 mg/dL — ABNORMAL HIGH (ref 0.61–1.24)
GFR, Estimated: 7 mL/min — ABNORMAL LOW (ref >60)
Glucose, Bld: 231 mg/dL — ABNORMAL HIGH (ref 70–99)
Potassium: 6.8 mmol/L (ref 3.5–5.1)
Sodium: 128 mmol/L — ABNORMAL LOW (ref 135–145)

## 2024-06-09 LAB — CBC WITH DIFFERENTIAL/PLATELET
Abs Immature Granulocytes: 0.03 K/uL (ref 0.00–0.07)
Basophils Absolute: 0 K/uL (ref 0.0–0.1)
Basophils Relative: 0 %
Eosinophils Absolute: 0 K/uL (ref 0.0–0.5)
Eosinophils Relative: 0 %
HCT: 35.3 % — ABNORMAL LOW (ref 39.0–52.0)
Hemoglobin: 11.6 g/dL — ABNORMAL LOW (ref 13.0–17.0)
Immature Granulocytes: 0 %
Lymphocytes Relative: 5 %
Lymphs Abs: 0.4 K/uL — ABNORMAL LOW (ref 0.7–4.0)
MCH: 27.8 pg (ref 26.0–34.0)
MCHC: 32.9 g/dL (ref 30.0–36.0)
MCV: 84.4 fL (ref 80.0–100.0)
Monocytes Absolute: 0.3 K/uL (ref 0.1–1.0)
Monocytes Relative: 3 %
Neutro Abs: 7.8 K/uL — ABNORMAL HIGH (ref 1.7–7.7)
Neutrophils Relative %: 92 %
Platelets: 416 K/uL — ABNORMAL HIGH (ref 150–400)
RBC: 4.18 MIL/uL — ABNORMAL LOW (ref 4.22–5.81)
RDW: 16.5 % — ABNORMAL HIGH (ref 11.5–15.5)
WBC: 8.5 K/uL (ref 4.0–10.5)
nRBC: 0 % (ref 0.0–0.2)

## 2024-06-09 LAB — TSH: TSH: 4.675 u[IU]/mL — ABNORMAL HIGH (ref 0.350–4.500)

## 2024-06-09 LAB — MRSA NEXT GEN BY PCR, NASAL: MRSA by PCR Next Gen: NOT DETECTED

## 2024-06-09 LAB — HEPARIN LEVEL (UNFRACTIONATED)
Heparin Unfractionated: <0.1 [IU]/mL — ABNORMAL LOW (ref 0.30–0.70)
Heparin Unfractionated: <0.1 [IU]/mL — ABNORMAL LOW (ref 0.30–0.70)

## 2024-06-09 LAB — TROPONIN I (HIGH SENSITIVITY): Troponin I (High Sensitivity): 15 ng/L (ref <18)

## 2024-06-09 MED ORDER — PENTAFLUOROPROP-TETRAFLUOROETH EX AERO: 1.0000 | INHALATION_SPRAY | CUTANEOUS | Status: DC | PRN

## 2024-06-09 MED ORDER — ALTEPLASE 2 MG IJ SOLR: 2.0000 mg | Freq: Once | INTRAMUSCULAR | Status: DC | PRN

## 2024-06-09 MED ORDER — LIDOCAINE-PRILOCAINE 2.5-2.5 % EX CREA: 1.0000 | TOPICAL_CREAM | CUTANEOUS | Status: DC | PRN

## 2024-06-09 MED ORDER — LIDOCAINE HCL (PF) 1 % IJ SOLN: 5.0000 mL | INTRAMUSCULAR | Status: DC | PRN

## 2024-06-09 MED ORDER — NEPRO/CARBSTEADY PO LIQD
237.0000 mL | ORAL | Status: DC | PRN
  Filled 2024-06-09: qty 237

## 2024-06-09 MED ORDER — HEPARIN SODIUM (PORCINE) 1000 UNIT/ML DIALYSIS: 3000.0000 [IU] | Freq: Once | INTRAMUSCULAR | Status: DC

## 2024-06-09 MED ORDER — SODIUM ZIRCONIUM CYCLOSILICATE 10 G PO PACK
10.0000 g | PACK | Freq: Two times a day (BID) | ORAL | Status: DC
  Administered 2024-06-09 – 2024-06-10 (×4): 10 g via ORAL
  Filled 2024-06-09 (×5): qty 1

## 2024-06-09 MED ORDER — ANTICOAGULANT SODIUM CITRATE 4% (200MG/5ML) IV SOLN
5.0000 mL | Status: DC | PRN
  Filled 2024-06-09: qty 5

## 2024-06-09 MED ORDER — LIDOCAINE VISCOUS HCL 2 % MT SOLN: 15.0000 mL | OROMUCOSAL | Status: DC | PRN

## 2024-06-09 MED ORDER — HEPARIN SODIUM (PORCINE) 1000 UNIT/ML DIALYSIS: 1000.0000 [IU] | INTRAMUSCULAR | Status: DC | PRN

## 2024-06-09 MED ORDER — HEPARIN SODIUM (PORCINE) 1000 UNIT/ML IJ SOLN
INTRAMUSCULAR | Status: AC
  Filled 2024-06-09: qty 4

## 2024-06-09 MED ORDER — CHLORHEXIDINE GLUCONATE CLOTH 2 % EX PADS
6.0000 | MEDICATED_PAD | Freq: Every day | CUTANEOUS | Status: DC
  Administered 2024-06-10: 06:00:00 6 via TOPICAL

## 2024-06-09 MED ORDER — NICOTINE 14 MG/24HR TD PT24
14.0000 mg | MEDICATED_PATCH | Freq: Every day | TRANSDERMAL | Status: DC
  Administered 2024-06-09 – 2024-06-10 (×2): 14 mg via TRANSDERMAL
  Filled 2024-06-09 (×4): qty 1

## 2024-06-09 NOTE — Progress Notes (Signed)
 Attempted echo, but patient is in dialysis at this time.

## 2024-06-09 NOTE — Progress Notes (Signed)
 [facility] night cross cover note:   Patient conveys oral irritation after breathing treatments, refractory to rinsing with water . Prn Lidocaine  mouthwash added for oral irritation after breathing treatments.      Eva Pore, DO Hospitalist

## 2024-06-09 NOTE — Progress Notes (Signed)
 PHARMACY - ANTICOAGULATION CONSULT NOTE  Pharmacy Consult for IV heparin  Indication: atrial fibrillation  Allergies[1]  Patient Measurements: Height: 6' 2 (188 cm) Weight: 77.7 kg (171 lb 4.8 oz) IBW/kg (Calculated) : 82.2 HEPARIN  DW (KG): 77.7  Vital Signs: Temp: 98 F (36.7 C) (12/14 1947) Temp Source: Oral (12/14 1947) BP: 113/68 (12/14 1947) Pulse Rate: 83 (12/14 1947)  Labs: Recent Labs    06/08/24 0845 06/08/24 1955 06/08/24 2024 06/08/24 2220 06/09/24 0043 06/09/24 0305 06/09/24 0659 06/09/24 2014  HGB 12.8* 8.8* 11.9* 12.2*  --  11.6*  --   --   HCT 39.9 26.0* 36.5* 36.0*  --  35.3*  --   --   PLT 429*  --  416*  --   --  416*  --   --   HEPARINUNFRC  --   --   --   --   --   --  <0.10* <0.10*  CREATININE 11.36* 5.30* 7.25*  --   --  7.86*  --   --   TROPONINIHS  --   --  23*  --  15  --   --   --     Estimated Creatinine Clearance: 10.3 mL/min (A) (by C-G formula based on SCr of 7.86 mg/dL (H)).   Medical History: Past Medical History:  Diagnosis Date   Acute renal failure superimposed on stage 2 chronic kidney disease 06/12/2018   Alcohol  abuse 10/18/2017   Allergy, unspecified, initial encounter 03/26/2021   Anaphylactic shock, unspecified, initial encounter 03/26/2021   Anemia in chronic kidney disease 03/26/2021   Anxiety and depression 10/06/2017   Arthritis    Atherosclerotic heart disease of native coronary artery without angina pectoris 03/26/2021   Bilateral lower extremity edema 06/11/2018   Bradycardia with 41-50 beats per minute 05/05/2021   CAD (coronary artery disease) 10/06/2017   CHF (congestive heart failure) (HCC)    CKD (chronic kidney disease) 07/20/2018   CKD (chronic kidney disease) stage 5, GFR less than 15 ml/min (HCC)    dialysis T/Th/Sa   Complication of vascular dialysis catheter 03/26/2021   COPD (chronic obstructive pulmonary disease) (HCC) 09/29/2017   Coronary artery calcification seen on CT scan 10/09/2017    Cough    Diarrhea, unspecified 03/26/2021   Disorder of phosphorus metabolism, unspecified 07/28/2021   Dyspnea    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   Edema 07/20/2018   ESRD (end stage renal disease) (HCC)    TTHSAT  Geronimo Car   Fever, unspecified 03/26/2021   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 Tremors whebn he moves it.   Shortness of breath 03/26/2021   Tobacco abuse 09/29/2017   Type 2 diabetes mellitus (HCC) 10/17/2011   diet controlled - per pt does not check CBG at home- insurance did not approve - 08/13/21   Type 2 diabetes mellitus with diabetic peripheral angiopathy without gangrene (HCC) 03/26/2021   Type 2 diabetes mellitus with other diabetic kidney complication (HCC) 03/26/2021    Assessment: [name] Rybarczyk Sr. is a 63 y.o. year old male admitted on  06/08/2024 with concern for new onset AF. No anticoagulation prior to admission. Heparin  for DVT prophylaxis started initially, last dose 5000 units SQ at 06/08/24 @ 2211. Pharmacy consulted to dose heparin .  HL <0.1 subtherapeutic again. No infusion issues or bleeding per RN.  Goal of Therapy:  Heparin  level 0.3-0.7 units/ml Monitor platelets by anticoagulation protocol: Yes   Plan:  Increase heparin  to 1700 units/hr (no bolus) Heparin  level with AM labs. Daily heparin  level, CBC, and monitoring for bleeding F/u plans for anticoagulation - likely to start apixaban   Thank you for allowing pharmacy to participate in this patient's  care.  Larraine Brazier, PharmD Clinical Pharmacist 06/09/2024  9:04 PM **Pharmacist phone directory can now be found on [email or web address] ([email or web address]).  Listed under [facility].       [1] No Known Allergies

## 2024-06-09 NOTE — Progress Notes (Signed)
 Progress Note   Patient: [name] Buss Sr. FMW:[id number] DOB: 1960-09-27 DOA: 06/08/2024     1 DOS: the patient was seen and examined on 06/09/2024   Brief hospital course: 63 y.o. male with medical history significant of ESRD on hemodialysis (TTS), COPD, not on oxygen , tobacco abuse, dementia, hypertension, history of stroke, type 2 diabetes, history of alcohol  abuse, peripheral neuropathy present here for shortness of breath.   History gathered from patient's daughters at the bedside.  They report that patient has having difficulty breathing since November.  He is not on oxygen  at home.  He missed his last dialysis appointment due to having loose stool since 4 to 5 days.  Has chronic cough with clear sputum associated with worsening shortness of breath and wheezing for the past couple of days.  No recent sick contact.  No fever, chills, headache, body ache, sore throat.   Has constipation therefore he took Dulcolax which caused him loose stools.   Family is concerned about his breathing and dark loose stools.  No abdominal pain, fever, chills, nausea, vomiting, over-the-counter NSAID use.  Family thinks that he needs to be on oxygen  at home.   He continues to smoke half a pack to 1 pack of cigarette per day.  He was with his mom.  He denies any illicit drug use.   Of note: Patient was supposed to be discharged after dialysis however due to worsening shortness of breath and drop in oxygen  saturation EDP requested admission.   ED Course: Upon arrival: Patient afebrile, pulse 68, RR: 16, BP 138/114, currently on 6 L of oxygen  via nasal cannula.  CBC shows WBC of 12.0, H&H 12.8/39.9, PLT 429.  NA: 126, K: 7.5, BUN 51, CR: 11.36, ALP: 326, T. bili: 1.3, AG: 17.  Chest x-ray shows enlargement of cardiopericardial silhouette with vascular congestion and diffuse interstitial opacities suggesting edema.  Left base collapse/consolidation with moderate left pleural effusion.  He underwent dialysis in ED.   His oxygen  saturation continued to drop.  Patient was given breathing treatment and methylprednisone 125 mg IV once in the ER.  [facility] hospitalist consulted for admission for acute hypoxemic respiratory failure in the setting of COPD exacerbation.  Assessment and Plan: Acute hypoxemic respiratory failure in the setting of COPD exacerbation and pulmonary edema in the setting of missed dialysis: - Patient has history of COPD.  Current everyday smoker.  Not on oxygen  at home.  Currently on 5 L of oxygen  via nasal cannula. - COVID flu RSV neg - continued on IV steroids with doxycycline . - DuoNebs scheduled as well as as needed.  On continuous pulse ox.  Will try to wean off of oxygen  as tolerated - Check a sputum culture.  Monitor vitals.   ESRD on dialysis: - Missed dialysis due to not feeling well. -Underwent dialysis in ED. - Nephrology following   Hyperkalemia: - Repeat BMP shows potassium of 8.2 after dialysis -K this AM 6.8. Continued on BID lokelma    Loose stools: -Likely due to stool softener-Dulcolax. -Will hold off stool studies at this time.  No recent antibiotic use or travel.   Hypertension: Blood pressure is currently stable - Will continue to monitor.  Resume home BP meds.-Coreg  and amlodipine    Hyponatremia: -Likely in the setting of hypervolemia due to missed dialysis appointment - Underwent dialysis.  Will monitor closely   History of stroke: - Patient is at baseline.   Chronic combined CHF: - Bilateral 2+ pitting edema noted on exam.  Reviewed chest x-ray. -  Monitor signs for fluid overload. BNP significantly elevated.-Echo done 05/10/2023 that showed EF of 40 to 45% with grade 2 diastolic dysfunction.     History of dementia: -History of recurrent falls per patient's family.   - On fall precautions.   Diet controlled diabetes: - Last A1c 6.1%.  On Lyrica  for neuropathy - Repeat A1c.   Hyperlipidemia: Continue statin   Depression/anxiety/insomnia: -  Continue Celexa  and trazodone    Anemia of chronic disease: - In the setting of ESRD. - H&H is stable.  Continue to monitor   Thrombocytosis: -Likely reactive.  Continue to monitor   History of seizures: Will continue current dose of Keppra .   Afib with rapid ventricular rate - EKG showed atrial fibrillation.  Heart rate in 120s. - Cardiology consulted with consideration for TEE cardioversion noted initially, however converted to sinus.  - cont metoprolol       Subjective: Without complaints when seen  Physical Exam: Vitals:   06/09/24 1617 06/09/24 1630 06/09/24 1657 06/09/24 1735  BP: 137/71 127/69  113/75  Pulse:  84  82  Resp:  17  18  Temp:  (!) 97.1 F (36.2 C)  98.2 F (36.8 C)  TempSrc:  Oral  Oral  SpO2:  91%  96%  Weight:   78.4 kg 77.7 kg  Height:    6' 2 (1.88 m)   General exam: Awake, laying in bed, in nad Respiratory system: Increased respiratory effort, no wheezing Cardiovascular system: regular rate, s1, s2 Gastrointestinal system: Soft, nondistended, positive BS Central nervous system: CN2-12 grossly intact, strength intact Extremities: Perfused, no clubbing Skin: Normal skin turgor, no notable skin lesions seen Psychiatry: Mood normal // no visual hallucinations   Data Reviewed:  Labs reviewed: Na 128, K 6.8, WBC 8.5, Hgb 11.6, Plts 416   Family Communication: Pt in room, family not at bedside  Disposition: Status is: Inpatient Remains inpatient appropriate because: severity of illness  Planned Discharge Destination: Home    Author: Garnette Pelt, MD 06/09/2024 6:01 PM  For on call review [email or web address].

## 2024-06-09 NOTE — Progress Notes (Signed)
 PT Cancellation Note  Patient Details Name: [name] Kullman Sr. MRN: [id number] DOB: 10/15/1960   Cancelled Treatment:    Reason Eval/Treat Not Completed: Patient at procedure or test/unavailable (Off unit for HD. Acute PT to follow and re-attempt as schedule allows.)  Madelynne Lasker W, PT, DPT Secure Chat Preferred  Rehab Office [phone]   Kate BRAVO Wendolyn 06/09/2024, 1:16 PM

## 2024-06-09 NOTE — Progress Notes (Signed)
 PHARMACY - ANTICOAGULATION CONSULT NOTE  Pharmacy Consult for IV heparin  Indication: atrial fibrillation  Allergies[1]  Patient Measurements: Height: 6' 2 (188 cm) Weight:  (No wt due to special bed.) IBW/kg (Calculated) : 82.2 HEPARIN  DW (KG): 93  Vital Signs: Temp: 98.4 F (36.9 C) (12/14 0908) Temp Source: Oral (12/14 0908) BP: 131/82 (12/14 0908) Pulse Rate: 99 (12/14 0908)  Labs: Recent Labs    06/08/24 0845 06/08/24 1955 06/08/24 2024 06/08/24 2220 06/09/24 0043 06/09/24 0305 06/09/24 0659  HGB 12.8* 8.8* 11.9* 12.2*  --  11.6*  --   HCT 39.9 26.0* 36.5* 36.0*  --  35.3*  --   PLT 429*  --  416*  --   --  416*  --   HEPARINUNFRC  --   --   --   --   --   --  <0.10*  CREATININE 11.36* 5.30* 7.25*  --   --  7.86*  --   TROPONINIHS  --   --  23*  --  15  --   --     Estimated Creatinine Clearance: 10.9 mL/min (A) (by C-G formula based on SCr of 7.86 mg/dL (H)).   Medical History: Past Medical History:  Diagnosis Date   Acute renal failure superimposed on stage 2 chronic kidney disease 06/12/2018   Alcohol  abuse 10/18/2017   Allergy, unspecified, initial encounter 03/26/2021   Anaphylactic shock, unspecified, initial encounter 03/26/2021   Anemia in chronic kidney disease 03/26/2021   Anxiety and depression 10/06/2017   Arthritis    Atherosclerotic heart disease of native coronary artery without angina pectoris 03/26/2021   Bilateral lower extremity edema 06/11/2018   Bradycardia with 41-50 beats per minute 05/05/2021   CAD (coronary artery disease) 10/06/2017   CHF (congestive heart failure) (HCC)    CKD (chronic kidney disease) 07/20/2018   CKD (chronic kidney disease) stage 5, GFR less than 15 ml/min (HCC)    dialysis T/Th/Sa   Complication of vascular dialysis catheter 03/26/2021   COPD (chronic obstructive pulmonary disease) (HCC) 09/29/2017   Coronary artery calcification seen on CT scan 10/09/2017   Cough    Diarrhea, unspecified 03/26/2021    Disorder of phosphorus metabolism, unspecified 07/28/2021   Dyspnea    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   Edema 07/20/2018   ESRD (end stage renal disease) (HCC)    [facility]  [name]   Fever, unspecified 03/26/2021   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 Tremors whebn he moves it.   Shortness of breath 03/26/2021   Tobacco abuse 09/29/2017   Type 2 diabetes mellitus (HCC) 10/17/2011   diet controlled - per pt does not check CBG at home- insurance did not approve - 08/13/21   Type 2 diabetes mellitus with diabetic peripheral angiopathy without gangrene (HCC) 03/26/2021   Type 2 diabetes mellitus with other diabetic kidney complication (HCC) 03/26/2021    Assessment: [name] Wilczynski Sr. is a 63 y.o. year old male admitted on 06/08/2024 with concern for new onset AF. No anticoagulation prior to admission. Heparin  for DVT prophylaxis started  initially, last dose 5000 units SQ at 06/08/24 @ 2211. Pharmacy consulted to dose heparin .  HL <0.1 subtherapeutic  CBC w Hgb 11.6, Plt 416   Goal of Therapy:  Heparin  level 0.3-0.7 units/ml Monitor platelets by anticoagulation protocol: Yes   Plan:  Increase heparin  to 1400 units/hr (no bolus) 8h heparin  level  Daily heparin  level, CBC, and monitoring for bleeding F/u plans for anticoagulation - likely to start apixaban   Thank you for allowing pharmacy to participate in this patient's care.  Sharyne Glatter, PharmD, BCCCP Critical Care Clinical Pharmacist 06/09/2024  9:34 AM     [1] No Known Allergies

## 2024-06-09 NOTE — Progress Notes (Signed)
° °  Brief Progress Note   _____________________________________________________________________________________________________________  Patient Name: [name] Wandler Sr. Patient DOB: [birth year] Date: @TODAY @      Data: Reviewed vital signs, labs, and notes.    Action: No action required at this time.    Response:  ESRD pt on HD. Nephrology consult. Scheduled for TEE plus cardioversion today.  _____________________________________________________________________________________________________________  The [facility] RN Expeditor Lilianna Case S Camilia [place] Please contact us  directly via secure chat (search for [facility]) or by calling us  at [phone] [facility]).

## 2024-06-09 NOTE — Progress Notes (Signed)
 Received patient in bed.Alert and oriented x 4 .Consent for treatment verified.  Access used: Right upper arm avf that worked well.  Duration of treatment: 3.5 hours.  Uf goal: Met 3.5 L,tolerated treatment.  Hemo comment: Heparin  boluses not given during hd treatment.   Hand off to the patient's nurse with stable vitals via transporter.

## 2024-06-09 NOTE — Plan of Care (Signed)
°  Problem: Education: Goal: Knowledge of disease or condition will improve 06/09/2024 1938 by Arnita Tawni BRAVO, RN Outcome: Progressing 06/09/2024 1938 by Arnita Tawni BRAVO, RN Outcome: Progressing Goal: Knowledge of the prescribed therapeutic regimen will improve 06/09/2024 1938 by Arnita Tawni BRAVO, RN Outcome: Progressing 06/09/2024 1938 by Arnita Tawni BRAVO, RN Outcome: Progressing Goal: Individualized Educational Video(s) 06/09/2024 1938 by Arnita Tawni BRAVO, RN Outcome: Progressing 06/09/2024 1938 by Arnita Tawni BRAVO, RN Outcome: Progressing   Problem: Activity: Goal: Ability to tolerate increased activity will improve 06/09/2024 1938 by Arnita Tawni BRAVO, RN Outcome: Progressing 06/09/2024 1938 by Arnita Tawni BRAVO, RN Outcome: Progressing Goal: Will verbalize the importance of balancing activity with adequate rest periods 06/09/2024 1938 by Arnita Tawni BRAVO, RN Outcome: Progressing 06/09/2024 1938 by Arnita Tawni BRAVO, RN Outcome: Progressing

## 2024-06-09 NOTE — Progress Notes (Signed)
 [name] Progress Note   Subjective:   Had HD yesterday but K+ is back up to 6.8 today. Reports he has not been eating so unclear cause. On the schedule for HD again this morning. Reports frequent diarrhea for several days and dyspnea on exertion.  Objective Vitals:   06/09/24 0442 06/09/24 0545 06/09/24 0600 06/09/24 0908  BP: 137/73  116/76 131/82  Pulse: 86 (!) 52 (!) 46 99  Resp:  19 19 17   Temp:    98.4 F (36.9 C)  TempSrc:    Oral  SpO2:  92% 90% 92%  Weight:      Height:       Physical Exam General: alert male in NAD Heart: irregular rate and rhythm Lungs: CTA bilaterally Abdomen: soft non-distended Extremities: no edema   Additional Objective Labs: Basic Metabolic Panel: Recent Labs  Lab 06/08/24 0845 06/08/24 1955 06/08/24 2024 06/08/24 2220 06/09/24 0305  NA 126* 133* 131* 126* 128*  K >7.5* 8.2* 5.9* 5.7* 6.8*  CL 85* 107 87*  --  89*  CO2 24  --  31  --  26  GLUCOSE 137* 79 111*  --  231*  BUN 51* 30* 28*  --  32*  CREATININE 11.36* 5.30* 7.25*  --  7.86*  CALCIUM  8.7*  --  9.0  --  8.7*   Liver Function Tests: Recent Labs  Lab 06/08/24 0845  AST 25  ALT 29  ALKPHOS 326*  BILITOT 1.3*  PROT 7.0  ALBUMIN 3.2*   Recent Labs  Lab 06/08/24 0845  LIPASE 28   CBC: Recent Labs  Lab 06/08/24 0845 06/08/24 1955 06/08/24 2024 06/08/24 2220 06/09/24 0305  WBC 12.0*  --  11.2*  --  8.5  NEUTROABS 8.8*  --   --   --  7.8*  HGB 12.8*   < > 11.9* 12.2* 11.6*  HCT 39.9   < > 36.5* 36.0* 35.3*  MCV 84.2  --  84.3  --  84.4  PLT 429*  --  416*  --  416*   < > = values in this interval not displayed.   Blood Culture No results found for: SDES, SPECREQUEST, CULT, REPTSTATUS  Cardiac Enzymes: No results for input(s): CKTOTAL, CKMB, CKMBINDEX, TROPONINI in the last 168 hours. CBG: Recent Labs  Lab 06/08/24 1101  GLUCAP 180*   Iron  Studies: No results for input(s): IRON , TIBC, TRANSFERRIN,  FERRITIN in the last 72 hours. @lablastinr3 @ Studies/Results: DG Chest 2 View Result Date: 06/09/2024 CLINICAL DATA:  Pulmonary edema. EXAM: CHEST - 2 VIEW COMPARISON:  06/08/2024 FINDINGS: The cardio pericardial silhouette is enlarged. Left base collapse/consolidation with moderate to large left pleural effusion is progressive in the interval. There is pulmonary vascular congestion without overt pulmonary edema. Probable skin fold over the right hemithorax. Telemetry leads overlie the chest. IMPRESSION: 1. Interval progression of left base collapse/consolidation with moderate to large left pleural effusion. 2. Pulmonary vascular congestion without overt pulmonary edema. 3. Probable skin fold overlies the lateral right lung with pneumothorax considered a less likely possibility given apparent lung markings peripheral to the curvilinear finding. Right-side-up decubitus film could be used to further evaluate as clinically warranted. Electronically Signed   By: [name] M.D.   On: 06/09/2024 09:12   DG Chest Portable 1 View Result Date: 06/08/2024 CLINICAL DATA:  Shortness of breath. EXAM: PORTABLE CHEST 1 VIEW COMPARISON:  05/09/2023 FINDINGS: The cardio pericardial silhouette is enlarged. Vascular congestion with diffuse interstitial opacity suggests edema. Left  base collapse/consolidation with moderate left pleural effusion. Hazy opacity at the right base suggests layering effusion. Telemetry leads overlie the chest. IMPRESSION: 1. Enlargement of the cardiopericardial silhouette with vascular congestion and diffuse interstitial opacity suggesting edema. 2. Left base collapse/consolidation with moderate left pleural effusion. 3. Layering small right effusion. Electronically Signed   By: [name] [name] M.D.   On: 06/08/2024 09:53   Medications:  anticoagulant sodium citrate      heparin  1,400 Units/hr (06/09/24 0938)    amLODipine   5 mg Oral Daily   Chlorhexidine  Gluconate Cloth  6 each Topical  Q0600   Chlorhexidine  Gluconate Cloth  6 each Topical Q0600   citalopram   40 mg Oral Daily   doxycycline   100 mg Oral Q12H   ferric citrate   630 mg Oral BID WC   fluticasone  furoate-vilanterol  1 puff Inhalation Daily   heparin   3,000 Units Dialysis Once in dialysis   ipratropium  0.5 mg Nebulization Q6H   levETIRAcetam   500 mg Oral BID   methylPREDNISolone  (SOLU-MEDROL ) injection  60 mg Intravenous Q12H   metoprolol  tartrate  12.5 mg Oral Q6H   nicotine   14 mg Transdermal Daily   pregabalin   25 mg Oral QHS   rosuvastatin   10 mg Oral Daily   sodium zirconium cyclosilicate   10 g Oral BID   traZODone   50 mg Oral QHS    Dialysis Orders: [name] olin tts 4 hours EDW 82.5kg BFR 425 DFR 800 avf 15g heparin  3000 unit bolus.   Assessment/Plan: 1. Hyperkalemia: Recurrent despite lokelma  and HD yesterday. Reports he has not been meeting much. Follow post HD today.  2. ESRD:  Typically TTS schedule, extra HD today as above.  3. HTN/volume:  Pulmonary edema on CXR. Further UF with HD today.  4. Anemia: Hgb 11.6. No ESA indicated at this time.  5. Secondary hyperparathyroidism:  Calcium  controlled, follow phos.  6. Nutrition:  Currently NPO 7. A.fib: cardiology onboard, possible echo and cardioversion planned.   [name] Collet, PA-C 06/09/2024, 10:40 AM  [employer] Pager: [phone]

## 2024-06-09 NOTE — Plan of Care (Signed)
°  Problem: Clinical Measurements: Goal: Diagnostic test results will improve Outcome: Progressing Goal: Respiratory complications will improve Outcome: Progressing   Problem: Coping: Goal: Level of anxiety will decrease Outcome: Progressing   Problem: Pain Managment: Goal: General experience of comfort will improve and/or be controlled Outcome: Progressing   Problem: Safety: Goal: Ability to remain free from injury will improve Outcome: Progressing

## 2024-06-09 NOTE — Progress Notes (Signed)
 DAILY PROGRESS NOTE   Patient Name: [name] Couzens Sr. Date of Encounter: 06/09/2024 Cardiologist: None  Chief Complaint   Short of breath  Patient Profile   [name] Ghee Sr. is a 63 y.o. male with a hx of ESRD on hemodialysis (TTS), COPD, not on oxygen , tobacco abuse, dementia, hypertension, history of stroke, type 2 diabetes, history of alcohol  abuse, peripheral neuropathy  who is being seen 06/08/2024 for the evaluation of new onset atrial fibrillation at the request of internal medicine.   Subjective   AFib resolved overnight- currently in sinus rhythm on IV heparin . No chest pain. Still with O2 requirement - he has COPD, not on oxygen  at home. Echo is pending today. He is ESRD on HD, plans for dialysis today.  Objective   Vitals:   06/09/24 1216 06/09/24 1226 06/09/24 1230 06/09/24 1300  BP: (!) 142/87 (!) 139/99  135/78  Pulse:  77 80 63  Resp:  (!) 27 18 18   Temp: 98.8 F (37.1 C)     TempSrc: Oral     SpO2:  94% 95% 94%  Weight:      Height:        Intake/Output Summary (Last 24 hours) at 06/09/2024 1315 Last data filed at 06/09/2024 9247 Gross per 24 hour  Intake 76.86 ml  Output 3600 ml  Net -3523.14 ml   Filed Weights   06/08/24 0852 06/09/24 1212  Weight: 93 kg 81.9 kg    Physical Exam   General appearance: alert and no distress Lungs: diminished breath sounds bilaterally Heart: regular rate and rhythm Extremities: extremities normal, atraumatic, no cyanosis or edema Neurologic: Grossly normal  Inpatient Medications    Scheduled Meds:  amLODipine   5 mg Oral Daily   Chlorhexidine  Gluconate Cloth  6 each Topical Q0600   Chlorhexidine  Gluconate Cloth  6 each Topical Q0600   citalopram   40 mg Oral Daily   doxycycline   100 mg Oral Q12H   ferric citrate   630 mg Oral BID WC   fluticasone  furoate-vilanterol  1 puff Inhalation Daily   heparin   3,000 Units Dialysis Once in dialysis   ipratropium  0.5 mg Nebulization Q6H   levETIRAcetam   500 mg  Oral BID   methylPREDNISolone  (SOLU-MEDROL ) injection  60 mg Intravenous Q12H   metoprolol  tartrate  12.5 mg Oral Q6H   nicotine   14 mg Transdermal Daily   pregabalin   25 mg Oral QHS   rosuvastatin   10 mg Oral Daily   sodium zirconium cyclosilicate   10 g Oral BID   traZODone   50 mg Oral QHS    Continuous Infusions:  anticoagulant sodium citrate      heparin  1,400 Units/hr (06/09/24 0938)    PRN Meds: alteplase , anticoagulant sodium citrate , feeding supplement (NEPRO CARB STEADY), heparin , heparin , lidocaine  (PF), lidocaine -prilocaine , pentafluoroprop-tetrafluoroeth   Labs   Results for orders placed or performed during the hospital encounter of 06/08/24 (from the past 48 hours)  CBC with Differential     Status: Abnormal   Collection Time: 06/08/24  8:45 AM  Result Value Ref Range   WBC 12.0 (H) 4.0 - 10.5 K/uL   RBC 4.74 4.22 - 5.81 MIL/uL   Hemoglobin 12.8 (L) 13.0 - 17.0 g/dL   HCT 60.0 60.9 - 47.9 %   MCV 84.2 80.0 - 100.0 fL   MCH 27.0 26.0 - 34.0 pg   MCHC 32.1 30.0 - 36.0 g/dL   RDW 83.3 (H) 88.4 - 84.4 %   Platelets 429 (H) 150 - 400 K/uL  nRBC 0.0 0.0 - 0.2 %   Neutrophils Relative % 72 %   Neutro Abs 8.8 (H) 1.7 - 7.7 K/uL   Lymphocytes Relative 11 %   Lymphs Abs 1.3 0.7 - 4.0 K/uL   Monocytes Relative 12 %   Monocytes Absolute 1.5 (H) 0.1 - 1.0 K/uL   Eosinophils Relative 3 %   Eosinophils Absolute 0.3 0.0 - 0.5 K/uL   Basophils Relative 1 %   Basophils Absolute 0.1 0.0 - 0.1 K/uL   Immature Granulocytes 1 %   Abs Immature Granulocytes 0.07 0.00 - 0.07 K/uL    Comment: Performed at [facility], [address] [place], [place] [address]  Comprehensive metabolic panel     Status: Abnormal   Collection Time: 06/08/24  8:45 AM  Result Value Ref Range   Sodium 126 (L) 135 - 145 mmol/L   Potassium >7.5 (HH) 3.5 - 5.1 mmol/L    Comment: RESULTS VERIFIED BY REPEAT TESTING CRITICAL RESULT CALLED TO, READ BACK BY AND VERIFIED WITH J.PERKINS RN @1005   12.13.2025 E.AHMED    Chloride 85 (L) 98 - 111 mmol/L   CO2 24 22 - 32 mmol/L   Glucose, Bld 137 (H) 70 - 99 mg/dL    Comment: Glucose reference range applies only to samples taken after fasting for at least 8 hours.   BUN 51 (H) 8 - 23 mg/dL   Creatinine, Ser 88.63 (H) 0.61 - 1.24 mg/dL   Calcium  8.7 (L) 8.9 - 10.3 mg/dL   Total Protein 7.0 6.5 - 8.1 g/dL   Albumin 3.2 (L) 3.5 - 5.0 g/dL   AST 25 15 - 41 U/L   ALT 29 0 - 44 U/L   Alkaline Phosphatase 326 (H) 38 - 126 U/L   Total Bilirubin 1.3 (H) 0.0 - 1.2 mg/dL   GFR, Estimated 5 (L) >60 mL/min    Comment: (NOTE) Calculated using the CKD-EPI Creatinine Equation (2021)    Anion gap 17 (H) 5 - 15    Comment: Performed at [facility], [address] [place], [place] [address]  Lipase, blood     Status: None   Collection Time: 06/08/24  8:45 AM  Result Value Ref Range   Lipase 28 11 - 51 U/L    Comment: Performed at [facility], [address] [place], [place] [address]  CBG monitoring, ED     Status: Abnormal   Collection Time: 06/08/24 11:01 AM  Result Value Ref Range   Glucose-Capillary 180 (H) 70 - 99 mg/dL    Comment: Glucose reference range applies only to samples taken after fasting for at least 8 hours.  Hepatitis B surface antigen     Status: None   Collection Time: 06/08/24 11:41 AM  Result Value Ref Range   Hepatitis B Surface Ag NON REACTIVE NON REACTIVE    Comment: Performed at [facility], [address] [place], [place] [address]  Brain natriuretic peptide     Status: Abnormal   Collection Time: 06/08/24  1:10 PM  Result Value Ref Range   B Natriuretic Peptide 1,475.3 (H) 0.0 - 100.0 pg/mL    Comment: Performed at [facility], [address], [place], [place] [address]  I-stat chem 8, ed     Status: Abnormal   Collection Time: 06/08/24  7:55 PM  Result Value Ref Range   Sodium 133 (L) 135 - 145 mmol/L   Potassium 8.2 (HH) 3.5 - 5.1 mmol/L  Chloride 107 98 - 111 mmol/L   BUN  30 (H) 8 - 23 mg/dL   Creatinine, Ser 4.69 (H) 0.61 - 1.24 mg/dL   Glucose, Bld 79 70 - 99 mg/dL    Comment: Glucose reference range applies only to samples taken after fasting for at least 8 hours.   Calcium , Ion 0.75 (LL) 1.15 - 1.40 mmol/L   TCO2 20 (L) 22 - 32 mmol/L   Hemoglobin 8.8 (L) 13.0 - 17.0 g/dL   HCT 73.9 (L) 60.9 - 47.9 %   Comment NOTIFIED PHYSICIAN   Resp panel by RT-PCR (RSV, Flu A&B, Covid) Anterior Nasal Swab     Status: None   Collection Time: 06/08/24  8:24 PM   Specimen: Anterior Nasal Swab  Result Value Ref Range   SARS Coronavirus 2 by RT PCR NEGATIVE NEGATIVE   Influenza A by PCR NEGATIVE NEGATIVE   Influenza B by PCR NEGATIVE NEGATIVE    Comment: (NOTE) The Xpert Xpress SARS-CoV-2/FLU/RSV plus assay is intended as an aid in the diagnosis of influenza from Nasopharyngeal swab specimens and should not be used as a sole basis for treatment. Nasal washings and aspirates are unacceptable for Xpert Xpress SARS-CoV-2/FLU/RSV testing.  Fact Sheet for Patients: bloggercourse.com  Fact Sheet for Healthcare Providers: seriousbroker.it  This test is not yet approved or cleared by the [place]  FDA and has been authorized for detection and/or diagnosis of SARS-CoV-2 by FDA under an Emergency Use Authorization (EUA). This EUA will remain in effect (meaning this test can be used) for the duration of the COVID-19 declaration under Section 564(b)(1) of the Act, 21 U.S.C. section 360bbb-3(b)(1), unless the authorization is terminated or revoked.     Resp Syncytial Virus by PCR NEGATIVE NEGATIVE    Comment: (NOTE) Fact Sheet for Patients: bloggercourse.com  Fact Sheet for Healthcare Providers: seriousbroker.it  This test is not yet approved or cleared by the [place]  FDA and has been authorized for detection and/or diagnosis of SARS-CoV-2 by FDA under an  Emergency Use Authorization (EUA). This EUA will remain in effect (meaning this test can be used) for the duration of the COVID-19 declaration under Section 564(b)(1) of the Act, 21 U.S.C. section 360bbb-3(b)(1), unless the authorization is terminated or revoked.  Performed at [facility], [address], [place], [place] [address]   HIV Antibody (routine testing w rflx)     Status: None   Collection Time: 06/08/24  8:24 PM  Result Value Ref Range   HIV Screen 4th Generation wRfx Non Reactive Non Reactive    Comment: Performed at [facility], [address] [place], [place] [address]  CBC     Status: Abnormal   Collection Time: 06/08/24  8:24 PM  Result Value Ref Range   WBC 11.2 (H) 4.0 - 10.5 K/uL   RBC 4.33 4.22 - 5.81 MIL/uL   Hemoglobin 11.9 (L) 13.0 - 17.0 g/dL   HCT 63.4 (L) 60.9 - 47.9 %   MCV 84.3 80.0 - 100.0 fL   MCH 27.5 26.0 - 34.0 pg   MCHC 32.6 30.0 - 36.0 g/dL   RDW 83.5 (H) 88.4 - 84.4 %   Platelets 416 (H) 150 - 400 K/uL   nRBC 0.0 0.0 - 0.2 %    Comment: Performed at [facility], [address], [place], [place] [address]  Basic metabolic panel     Status: Abnormal   Collection Time: 06/08/24  8:24 PM  Result Value Ref Range  Sodium 131 (L) 135 - 145 mmol/L   Potassium 5.9 (H) 3.5 - 5.1 mmol/L   Chloride 87 (L) 98 - 111 mmol/L   CO2 31 22 - 32 mmol/L   Glucose, Bld 111 (H) 70 - 99 mg/dL    Comment: Glucose reference range applies only to samples taken after fasting for at least 8 hours.   BUN 28 (H) 8 - 23 mg/dL   Creatinine, Ser 2.74 (H) 0.61 - 1.24 mg/dL   Calcium  9.0 8.9 - 10.3 mg/dL   GFR, Estimated 8 (L) >60 mL/min    Comment: (NOTE) Calculated using the CKD-EPI Creatinine Equation (2021)    Anion gap 13 5 - 15    Comment: Performed at [facility], [address], [place], [place] [address]  Magnesium     Status: Abnormal   Collection Time: 06/08/24  8:24 PM  Result Value Ref Range   Magnesium 2.6 (H) 1.7 - 2.4  mg/dL    Comment: Performed at [facility], [address], [place], [place] [address]  Hemoglobin A1c     Status: Abnormal   Collection Time: 06/08/24  8:24 PM  Result Value Ref Range   Hgb A1c MFr Bld 6.2 (H) 4.8 - 5.6 %    Comment: (NOTE) Diagnosis of Diabetes The following HbA1c ranges recommended by the American Diabetes Association (ADA) may be used as an aid in the diagnosis of diabetes mellitus.  Hemoglobin             Suggested A1C NGSP%              Diagnosis  <5.7                   Non Diabetic  5.7-6.4                Pre-Diabetic  >6.4                   Diabetic  <7.0                   Glycemic control for                       adults with diabetes.     Mean Plasma Glucose 131.24 mg/dL    Comment: Performed at [facility], [address] [place], [place] [address]  Troponin I (High Sensitivity)     Status: Abnormal   Collection Time: 06/08/24  8:24 PM  Result Value Ref Range   Troponin I (High Sensitivity) 23 (H) <18 ng/L    Comment: (NOTE) Elevated high sensitivity troponin I (hsTnI) values and significant  changes across serial measurements may suggest ACS but many other  chronic and acute conditions are known to elevate hsTnI results.  Refer to the Links section for chest pain algorithms and additional  guidance. Performed at [facility], [address], [place], [place] [address]   I-Stat arterial blood gas, ED     Status: Abnormal   Collection Time: 06/08/24 10:20 PM  Result Value Ref Range   pH, Arterial 7.512 (H) 7.35 - 7.45   pCO2 arterial 39.5 32 - 48 mmHg   pO2, Arterial 42 (L) 83 - 108 mmHg   Bicarbonate 31.7 (H) 20.0 - 28.0 mmol/L   TCO2 33 (H) 22 - 32 mmol/L   O2 Saturation 83 %   Acid-Base Excess 8.0 (H) 0.0 - 2.0 mmol/L  Sodium 126 (L) 135 - 145 mmol/L   Potassium 5.7 (H) 3.5 - 5.1 mmol/L   Calcium , Ion 1.07 (L) 1.15 - 1.40 mmol/L   HCT 36.0 (L) 39.0 - 52.0 %   Hemoglobin 12.2 (L) 13.0 - 17.0 g/dL   Patient  temperature 98.5 F    Collection site RADIAL, ALLEN'S TEST ACCEPTABLE    Drawn by RT    Sample type ARTERIAL   TSH     Status: Abnormal   Collection Time: 06/09/24 12:43 AM  Result Value Ref Range   TSH 4.675 (H) 0.350 - 4.500 uIU/mL    Comment: Performed by a 3rd Generation assay with a functional sensitivity of <=0.01 uIU/mL. Performed at [facility], [address], [place], [place] [address]   Troponin I (High Sensitivity)     Status: None   Collection Time: 06/09/24 12:43 AM  Result Value Ref Range   Troponin I (High Sensitivity) 15 <18 ng/L    Comment: (NOTE) Elevated high sensitivity troponin I (hsTnI) values and significant  changes across serial measurements may suggest ACS but many other  chronic and acute conditions are known to elevate hsTnI results.  Refer to the Links section for chest pain algorithms and additional  guidance. Performed at [facility], [address], [place], [place] [address]   Basic metabolic panel     Status: Abnormal   Collection Time: 06/09/24  3:05 AM  Result Value Ref Range   Sodium 128 (L) 135 - 145 mmol/L   Potassium 6.8 (HH) 3.5 - 5.1 mmol/L    Comment: REPEATED TO VERIFY CRITICAL RESULT CALLED TO, READ BACK BY AND VERIFIED WITH E.SHAFFER RN 0355 06/09/2024 BY G.GANADEN    Chloride 89 (L) 98 - 111 mmol/L   CO2 26 22 - 32 mmol/L   Glucose, Bld 231 (H) 70 - 99 mg/dL    Comment: Glucose reference range applies only to samples taken after fasting for at least 8 hours.   BUN 32 (H) 8 - 23 mg/dL   Creatinine, Ser 2.13 (H) 0.61 - 1.24 mg/dL   Calcium  8.7 (L) 8.9 - 10.3 mg/dL   GFR, Estimated 7 (L) >60 mL/min    Comment: (NOTE) Calculated using the CKD-EPI Creatinine Equation (2021)    Anion gap 13 5 - 15    Comment: Performed at [facility], [address] [place], [place] [address]  CBC with Differential/Platelet     Status: Abnormal   Collection Time: 06/09/24  3:05 AM  Result Value Ref Range   WBC 8.5 4.0 -  10.5 K/uL   RBC 4.18 (L) 4.22 - 5.81 MIL/uL   Hemoglobin 11.6 (L) 13.0 - 17.0 g/dL   HCT 64.6 (L) 60.9 - 47.9 %   MCV 84.4 80.0 - 100.0 fL   MCH 27.8 26.0 - 34.0 pg   MCHC 32.9 30.0 - 36.0 g/dL   RDW 83.4 (H) 88.4 - 84.4 %   Platelets 416 (H) 150 - 400 K/uL   nRBC 0.0 0.0 - 0.2 %   Neutrophils Relative % 92 %   Neutro Abs 7.8 (H) 1.7 - 7.7 K/uL   Lymphocytes Relative 5 %   Lymphs Abs 0.4 (L) 0.7 - 4.0 K/uL   Monocytes Relative 3 %   Monocytes Absolute 0.3 0.1 - 1.0 K/uL   Eosinophils Relative 0 %   Eosinophils Absolute 0.0 0.0 - 0.5 K/uL   Basophils Relative 0 %   Basophils Absolute 0.0 0.0 - 0.1  K/uL   Immature Granulocytes 0 %   Abs Immature Granulocytes 0.03 0.00 - 0.07 K/uL    Comment: Performed at [facility], [address], [place], [place] [address]  Heparin  level (unfractionated)     Status: Abnormal   Collection Time: 06/09/24  6:59 AM  Result Value Ref Range   Heparin  Unfractionated <0.10 (L) 0.30 - 0.70 IU/mL    Comment: (NOTE) The clinical reportable range upper limit is being lowered to >1.10 to align with the FDA approved guidance for the current laboratory assay.  If heparin  results are below expected values, and patient dosage has  been confirmed, suggest follow up testing of antithrombin III levels. Performed at [facility], [address], [place], [place] [address]     ECG   N/A  Telemetry   Sinus rhythm - Personally Reviewed  Radiology    DG Chest 2 View Result Date: 06/09/2024 CLINICAL DATA:  Pulmonary edema. EXAM: CHEST - 2 VIEW COMPARISON:  06/08/2024 FINDINGS: The cardio pericardial silhouette is enlarged. Left base collapse/consolidation with moderate to large left pleural effusion is progressive in the interval. There is pulmonary vascular congestion without overt pulmonary edema. Probable skin fold over the right hemithorax. Telemetry leads overlie the chest. IMPRESSION: 1. Interval progression of left base  collapse/consolidation with moderate to large left pleural effusion. 2. Pulmonary vascular congestion without overt pulmonary edema. 3. Probable skin fold overlies the lateral right lung with pneumothorax considered a less likely possibility given apparent lung markings peripheral to the curvilinear finding. Right-side-up decubitus film could be used to further evaluate as clinically warranted. Electronically Signed   By: Camellia Candle M.D.   On: 06/09/2024 09:12   DG Chest Portable 1 View Result Date: 06/08/2024 CLINICAL DATA:  Shortness of breath. EXAM: PORTABLE CHEST 1 VIEW COMPARISON:  05/09/2023 FINDINGS: The cardio pericardial silhouette is enlarged. Vascular congestion with diffuse interstitial opacity suggests edema. Left base collapse/consolidation with moderate left pleural effusion. Hazy opacity at the right base suggests layering effusion. Telemetry leads overlie the chest. IMPRESSION: 1. Enlargement of the cardiopericardial silhouette with vascular congestion and diffuse interstitial opacity suggesting edema. 2. Left base collapse/consolidation with moderate left pleural effusion. 3. Layering small right effusion. Electronically Signed   By: Camellia Candle M.D.   On: 06/08/2024 09:53    Cardiac Studies   Echo pending  Assessment   Principal Problem:   Acute hypoxemic respiratory failure (HCC) Active Problems:   PAF (paroxysmal atrial fibrillation) (HCC)   Plan   Converted to sinus rhythm overnight- no need for procedures, therefore will allow diet today. Echo pending, likely after dialysis today. He is on IV Heparin , would transition to DOAC once there are no further plans for procedures and we have echo results back. He is on metoprolol  12.5 mg q6 hrs - probably can consolidate that as well now.   Time Spent Directly with Patient:  I have spent a total of 25 minutes with the patient reviewing hospital notes, telemetry, EKGs, labs and examining the patient as well as establishing  an assessment and plan that was discussed personally with the patient.  > 50% of time was spent in direct patient care.  Length of Stay:  LOS: 1 day   Vinie KYM Maxcy, MD, [facility], FNLA, FACP  [place]  [facility]  Medical Director of the [facility]  [facility] Diplomate of the American Board of Clinical Lipidology Attending Cardiologist  Direct Dial : [phone]  Fax:  [phone]  Website:  [email or web address]  Vinie BROCKS Elaisha Zahniser 06/09/2024, 1:15 PM

## 2024-06-09 NOTE — Hospital Course (Signed)
 63 y.o. male with medical history significant of ESRD on hemodialysis (TTS), COPD, not on oxygen , tobacco abuse, dementia, hypertension, history of stroke, type 2 diabetes, history of alcohol  abuse, peripheral neuropathy present here for shortness of breath.   History gathered from patient's daughters at the bedside.  They report that patient has having difficulty breathing since November.  He is not on oxygen  at home.  He missed his last dialysis appointment due to having loose stool since 4 to 5 days.  Has chronic cough with clear sputum associated with worsening shortness of breath and wheezing for the past couple of days.  No recent sick contact.  No fever, chills, headache, body ache, sore throat.   Has constipation therefore he took Dulcolax which caused him loose stools.   Family is concerned about his breathing and dark loose stools.  No abdominal pain, fever, chills, nausea, vomiting, over-the-counter NSAID use.  Family thinks that he needs to be on oxygen  at home.   He continues to smoke half a pack to 1 pack of cigarette per day.  He was with his mom.  He denies any illicit drug use.   Of note: Patient was supposed to be discharged after dialysis however due to worsening shortness of breath and drop in oxygen  saturation EDP requested admission.   ED Course: Upon arrival: Patient afebrile, pulse 68, RR: 16, BP 138/114, currently on 6 L of oxygen  via nasal cannula.  CBC shows WBC of 12.0, H&H 12.8/39.9, PLT 429.  NA: 126, K: 7.5, BUN 51, CR: 11.36, ALP: 326, T. bili: 1.3, AG: 17.  Chest x-ray shows enlargement of cardiopericardial silhouette with vascular congestion and diffuse interstitial opacities suggesting edema.  Left base collapse/consolidation with moderate left pleural effusion.  He underwent dialysis in ED.  His oxygen  saturation continued to drop.  Patient was given breathing treatment and methylprednisone 125 mg IV once in the ER.  [facility] hospitalist consulted for admission for acute  hypoxemic respiratory failure in the setting of COPD exacerbation.

## 2024-06-10 ENCOUNTER — Other Ambulatory Visit: Payer: Self-pay

## 2024-06-10 ENCOUNTER — Other Ambulatory Visit (HOSPITAL_COMMUNITY): Payer: Self-pay

## 2024-06-10 ENCOUNTER — Telehealth (HOSPITAL_COMMUNITY): Payer: Self-pay

## 2024-06-10 ENCOUNTER — Inpatient Hospital Stay (HOSPITAL_COMMUNITY)

## 2024-06-10 DIAGNOSIS — I5042 Chronic combined systolic (congestive) and diastolic (congestive) heart failure: Secondary | ICD-10-CM

## 2024-06-10 LAB — ECHOCARDIOGRAM COMPLETE
AR max vel: 1.36 cm2
AV Area VTI: 1.36 cm2
AV Area mean vel: 1.48 cm2
AV Mean grad: 14 mmHg
AV Peak grad: 23.4 mmHg
Ao pk vel: 2.42 m/s
Area-P 1/2: 5.54 cm2
Calc EF: 47.7 %
Height: 74 in
S' Lateral: 4.2 cm
Single Plane A2C EF: 57.4 %
Single Plane A4C EF: 31.3 %
Weight: 2740.76 [oz_av]

## 2024-06-10 LAB — COMPREHENSIVE METABOLIC PANEL WITH GFR
ALT: 21 U/L (ref 0–44)
AST: 16 U/L (ref 15–41)
Albumin: 2.6 g/dL — ABNORMAL LOW (ref 3.5–5.0)
Alkaline Phosphatase: 232 U/L — ABNORMAL HIGH (ref 38–126)
Anion gap: 13 (ref 5–15)
BUN: 25 mg/dL — ABNORMAL HIGH (ref 8–23)
CO2: 26 mmol/L (ref 22–32)
Calcium: 9 mg/dL (ref 8.9–10.3)
Chloride: 94 mmol/L — ABNORMAL LOW (ref 98–111)
Creatinine, Ser: 5.73 mg/dL — ABNORMAL HIGH (ref 0.61–1.24)
GFR, Estimated: 10 mL/min — ABNORMAL LOW (ref >60)
Glucose, Bld: 146 mg/dL — ABNORMAL HIGH (ref 70–99)
Potassium: 5.3 mmol/L — ABNORMAL HIGH (ref 3.5–5.1)
Sodium: 133 mmol/L — ABNORMAL LOW (ref 135–145)
Total Bilirubin: 0.6 mg/dL (ref 0.0–1.2)
Total Protein: 6.2 g/dL — ABNORMAL LOW (ref 6.5–8.1)

## 2024-06-10 LAB — CBC
HCT: 32.5 % — ABNORMAL LOW (ref 39.0–52.0)
Hemoglobin: 10.7 g/dL — ABNORMAL LOW (ref 13.0–17.0)
MCH: 27.4 pg (ref 26.0–34.0)
MCHC: 32.9 g/dL (ref 30.0–36.0)
MCV: 83.1 fL (ref 80.0–100.0)
Platelets: 394 K/uL (ref 150–400)
RBC: 3.91 MIL/uL — ABNORMAL LOW (ref 4.22–5.81)
RDW: 16.5 % — ABNORMAL HIGH (ref 11.5–15.5)
WBC: 12.9 K/uL — ABNORMAL HIGH (ref 4.0–10.5)
nRBC: 0 % (ref 0.0–0.2)

## 2024-06-10 LAB — HEPARIN LEVEL (UNFRACTIONATED): Heparin Unfractionated: 0.1 [IU]/mL — ABNORMAL LOW (ref 0.30–0.70)

## 2024-06-10 MED ORDER — IPRATROPIUM BROMIDE 0.02 % IN SOLN
0.5000 mg | Freq: Two times a day (BID) | RESPIRATORY_TRACT | Status: DC
  Administered 2024-06-10: 20:00:00 0.5 mg via RESPIRATORY_TRACT
  Filled 2024-06-10: qty 2.5

## 2024-06-10 MED ORDER — HEPARIN BOLUS VIA INFUSION
2000.0000 [IU] | Freq: Once | INTRAVENOUS | Status: AC
  Administered 2024-06-10: 06:00:00 2000 [IU] via INTRAVENOUS
  Filled 2024-06-10: qty 2000

## 2024-06-10 MED ORDER — APIXABAN 5 MG PO TABS
5.0000 mg | ORAL_TABLET | Freq: Two times a day (BID) | ORAL | Status: DC
  Administered 2024-06-10 (×2): 5 mg via ORAL
  Filled 2024-06-10 (×3): qty 1

## 2024-06-10 MED ORDER — METOPROLOL TARTRATE 25 MG PO TABS: 25.0000 mg | ORAL_TABLET | Freq: Two times a day (BID) | ORAL | Status: DC

## 2024-06-10 MED ORDER — METOPROLOL TARTRATE 50 MG PO TABS
50.0000 mg | ORAL_TABLET | Freq: Two times a day (BID) | ORAL | Status: DC
  Administered 2024-06-10 (×2): 50 mg via ORAL
  Filled 2024-06-10 (×3): qty 1

## 2024-06-10 MED ORDER — CHLORHEXIDINE GLUCONATE CLOTH 2 % EX PADS
6.0000 | MEDICATED_PAD | Freq: Every day | CUTANEOUS | Status: DC
  Administered 2024-06-11: 06:00:00 6 via TOPICAL

## 2024-06-10 MED ORDER — METHYLPREDNISOLONE SODIUM SUCC 125 MG IJ SOLR: 60.0000 mg | Freq: Every day | INTRAMUSCULAR | Status: DC

## 2024-06-10 NOTE — Progress Notes (Signed)
 PHARMACY - ANTICOAGULATION CONSULT NOTE  Pharmacy Consult for IV heparin  Indication: atrial fibrillation  Allergies[1]  Patient Measurements: Height: 6' 2 (188 cm) Weight: 77.7 kg (171 lb 4.8 oz) IBW/kg (Calculated) : 82.2 HEPARIN  DW (KG): 77.7  Vital Signs: Temp: 98.2 F (36.8 C) (12/15 0341) Temp Source: Oral (12/15 0341) BP: 148/84 (12/15 0341) Pulse Rate: 80 (12/15 0341)  Labs: Recent Labs    06/08/24 2024 06/08/24 2220 06/09/24 0043 06/09/24 0305 06/09/24 0659 06/09/24 2014 06/10/24 0339  HGB 11.9* 12.2*  --  11.6*  --   --  10.7*  HCT 36.5* 36.0*  --  35.3*  --   --  32.5*  PLT 416*  --   --  416*  --   --  394  HEPARINUNFRC  --   --   --   --  <0.10* <0.10* 0.10*  CREATININE 7.25*  --   --  7.86*  --   --  5.73*  TROPONINIHS 23*  --  15  --   --   --   --     Estimated Creatinine Clearance: 14.1 mL/min (A) (by C-G formula based on SCr of 5.73 mg/dL (H)).   Medical History: Past Medical History:  Diagnosis Date   Acute renal failure superimposed on stage 2 chronic kidney disease 06/12/2018   Alcohol  abuse 10/18/2017   Allergy, unspecified, initial encounter 03/26/2021   Anaphylactic shock, unspecified, initial encounter 03/26/2021   Anemia in chronic kidney disease 03/26/2021   Anxiety and depression 10/06/2017   Arthritis    Atherosclerotic heart disease of native coronary artery without angina pectoris 03/26/2021   Bilateral lower extremity edema 06/11/2018   Bradycardia with 41-50 beats per minute 05/05/2021   CAD (coronary artery disease) 10/06/2017   CHF (congestive heart failure) (HCC)    CKD (chronic kidney disease) 07/20/2018   CKD (chronic kidney disease) stage 5, GFR less than 15 ml/min (HCC)    dialysis T/Th/Sa   Complication of vascular dialysis catheter 03/26/2021   COPD (chronic obstructive pulmonary disease) (HCC) 09/29/2017   Coronary artery calcification seen on CT scan 10/09/2017   Cough    Diarrhea, unspecified 03/26/2021    Disorder of phosphorus metabolism, unspecified 07/28/2021   Dyspnea    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   Edema 07/20/2018   ESRD (end stage renal disease) (HCC)    TTHSAT  Geronimo Car   Fever, unspecified 03/26/2021   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 Tremors whebn he moves it.   Shortness of breath 03/26/2021   Tobacco abuse 09/29/2017   Type 2 diabetes mellitus (HCC) 10/17/2011   diet controlled - per pt does not check CBG at home- insurance did not approve - 08/13/21   Type 2 diabetes mellitus with diabetic peripheral angiopathy without gangrene (HCC) 03/26/2021   Type 2 diabetes mellitus with other diabetic kidney complication (HCC) 03/26/2021    Assessment: [name] Mantell Sr. is a 63 y.o. year old male admitted on 06/08/2024 with concern for new onset AF. No anticoagulation prior to admission. Heparin  for DVT prophylaxis  started initially, last dose 5000 units SQ at 06/08/24 @ 2211. Pharmacy consulted to dose heparin .  HL <0.1 subtherapeutic again. No infusion issues or bleeding per RN.  12/15 AM update:  Heparin  level sub-therapeutic  Goal of Therapy:  Heparin  level 0.3-0.7 units/ml Monitor platelets by anticoagulation protocol: Yes   Plan:  Heparin  2000 units bolus Increase heparin  to 1900 units/hr  Heparin  level in 8 hours F/u plans for anticoagulation - likely to start apixaban   [name] [name], PharmD, BCPS Clinical Pharmacist Phone: [phone]        [1] No Known Allergies

## 2024-06-10 NOTE — Progress Notes (Signed)
 Pt receives pout-pt HD at [facility], TTS, 1045am chair time. Will continue to assist as needed.   [facility] [phone]

## 2024-06-10 NOTE — Evaluation (Signed)
 Occupational Therapy Evaluation Patient Details Name: [name] Shough Sr. MRN: [id number] DOB: 1961-03-17 Today's Date: 06/10/2024   History of Present Illness   63 yo M adm 12/13 SOB  with diarrhea x 4 days dark stool color. Pt noted to have missed HD but uncertain last day. Pt workup for COPD exacerbation, pulmonary edema 2/2 missed HD PMH COPD, ESRD on HD TTS, DM2, seizures, fibrillary glomerulonephritis, ETOH abuse, Smoker, CAD, ACDF, chronic R parietal lobe infarct, peripheral neuropathy,dementia     Clinical Impressions PTA pt lives independently with his Mom, who he assists as needed, and drives himself to dialysis TThSat. Pt reports increased SOB with activities and mobility. Began education regarding energy conservation/activity modification. VSS on 3L with 2/4 DOE with activity. SpO2 dests to 88 however rebounds quickly. I know I need oxygen  at home. Acute OT will follow to facilitate safe DC home however do not anticipate the need for OT after DC.      If plan is discharge home, recommend the following:   Assistance with cooking/housework;Assist for transportation     Functional Status Assessment   Patient has had a recent decline in their functional status and demonstrates the ability to make significant improvements in function in a reasonable and predictable amount of time.     Equipment Recommendations   Tub/shower seat     Recommendations for Other Services         Precautions/Restrictions   Precautions Precautions: Fall Recall of Precautions/Restrictions: Intact Precaution/Restrictions Comments: on 2-3L O2 Restrictions Weight Bearing Restrictions Per Provider Order: No     Mobility Bed Mobility               General bed mobility comments: OOB in chair    Transfers Overall transfer level: Needs assistance Equipment used: None Transfers: Sit to/from Stand Sit to Stand: Supervision                  Balance Overall balance  assessment: Needs assistance Sitting-balance support: No upper extremity supported, Feet supported Sitting balance-Leahy Scale: Good     Standing balance support: No upper extremity supported, During functional activity Standing balance-Leahy Scale: Fair                 High Level Balance Comments: abnormal gait pattern at baseline due to previous SCI           ADL either performed or assessed with clinical judgement   ADL Overall ADL's : Needs assistance/impaired Eating/Feeding: Modified independent   Grooming: Set up;Standing   Upper Body Bathing: Set up;Sitting   Lower Body Bathing: Supervison/ safety;Sit to/from stand   Upper Body Dressing : Set up;Sitting   Lower Body Dressing: Minimal assistance;Sit to/from stand Lower Body Dressing Details (indicate cue type and reason): difficulty with socks; reaching to the floor Toilet Transfer: Supervision/safety;Ambulation   Toileting- Clothing Manipulation and Hygiene: Modified independent       Functional mobility during ADLs: Supervision/safety       Vision Baseline Vision/History: 1 Wears glasses Ability to See in Adequate Light: 2 Moderately impaired Patient Visual Report: No change from baseline Vision Assessment?: Wears glasses for reading Additional Comments: low vision deficits at baseline     Perception         Praxis         Pertinent Vitals/Pain Pain Assessment Pain Assessment: No/denies pain     Extremity/Trunk Assessment Upper Extremity Assessment Upper Extremity Assessment: LUE deficits/detail LUE Deficits / Details: hx of previous SCI wtih residual L neuropraxia;  poor fine-motor; uses as a functional assist LUE Sensation: decreased light touch;decreased proprioception LUE Coordination: decreased fine motor   Lower Extremity Assessment Lower Extremity Assessment: Defer to PT evaluation (states hx of LLE weakness form SCI) LLE Deficits / Details: hx of weakness after accident at work.  grossly 4/5 LLE Sensation: WNL LLE Coordination: WNL   Cervical / Trunk Assessment Cervical / Trunk Assessment: Normal   Communication Communication Communication: No apparent difficulties   Cognition Arousal: Alert Behavior During Therapy: WFL for tasks assessed/performed, Agitated (agitated at times; has his way of doing things) Cognition: History of cognitive impairments                               Following commands: Intact       Cueing  General Comments   Cueing Techniques: Verbal cues  SpO2 90% on RA at rest, to low of 83% after 5x sit-stand on RA. 92% on 2L with gait but 89% on 2L with longer ambulation, tolerated well on 3L then bumped back to 2L at rest.   Exercises     Shoulder Instructions      Home Living Family/patient expects to be discharged to:: Private residence Living Arrangements: Parent Available Help at Discharge: Family;Available 24 hours/day Type of Home: House Home Access: Stairs to enter [employer] of Steps: 1 + 1 Entrance Stairs-Rails: None Home Layout: One level     Bathroom Shower/Tub: Chief Strategy Officer: Handicapped height Bathroom Accessibility: Yes How Accessible: Accessible via walker Home Equipment: Rolling Walker (2 wheels);Rollator (4 wheels);Cane - single point          Prior Functioning/Environment Prior Level of Function : Independent/Modified Independent;Driving;History of Falls (last six months)             Mobility Comments: pt reports single fall in last 6 months, no exercise, sedentary both in home;  no use of DME ADLs Comments: pt reports independence. lives with his mother and does not have to help her but does IADLs; drives himself to dialysis T,Th,Sat    OT Problem List:     OT Treatment/Interventions: Self-care/ADL training;Therapeutic exercise;Energy conservation;DME and/or AE instruction;Therapeutic activities;Visual/perceptual  remediation/compensation;Patient/family education;Balance training      OT Goals(Current goals can be found in the care plan section)   Acute Rehab OT Goals Patient Stated Goal: go home OT Goal Formulation: With patient Time For Goal Achievement: 06/24/24 Potential to Achieve Goals: Good   OT Frequency:  Min 2X/week    Co-evaluation              AM-PAC OT 6 Clicks Daily Activity     Outcome Measure Help from another person eating meals?: None Help from another person taking care of personal grooming?: A Little Help from another person toileting, which includes using toliet, bedpan, or urinal?: A Little Help from another person bathing (including washing, rinsing, drying)?: A Little Help from another person to put on and taking off regular upper body clothing?: A Little Help from another person to put on and taking off regular lower body clothing?: A Little 6 Click Score: 19   End of Session Equipment Utilized During Treatment: Gait belt Nurse Communication: Mobility status  Activity Tolerance: Patient tolerated treatment well Patient left:    OT Visit Diagnosis: Unsteadiness on feet (R26.81);Muscle weakness (generalized) (M62.81);Low vision, both eyes (H54.2)                Time: 9054-8994  OT Time Calculation (min): 20 min Charges:  OT General Charges $OT Visit: 1 Visit OT Evaluation $OT Eval Low Complexity: 1 Low  Warwick Nick, OT/L   Acute OT Clinical Specialist Acute Rehabilitation Services Pager [phone] Office [phone]   [facility] 06/10/2024, 10:15 AM

## 2024-06-10 NOTE — Progress Notes (Signed)
°  [name] [name], PT  Physical Therapist Physical Therapy   Progress Notes    Signed   Date of Service: 06/10/2024 10:01 AM   Signed      SATURATION QUALIFICATIONS: (This note is used to comply with regulatory documentation for home oxygen )   Patient Saturations on Room Air at Rest = 90%   Patient Saturations on Room Air while Ambulating = 83%   Patient Saturations on 3 Liters of oxygen  while Ambulating = 93%   Please briefly explain why patient needs home oxygen : Pt needed up to 3L O2 to maintain SpO2 >90% with gait.    [name], PT, DPT    Acute Rehabilitation Department Office [phone] Secure Chat Communication Preferred

## 2024-06-10 NOTE — Evaluation (Signed)
 Physical Therapy Evaluation Patient Details Name: [name] Stahl Sr. MRN: [id number] DOB: Jul 13, 1960 Today's Date: 06/10/2024  History of Present Illness  63 yo M adm 12/13 SOB  with diarrhea x 4 days dark stool color. Pt noted to have missed HD but uncertain last day. Pt workup for COPD exacerbation, pulmonary edema 2/2 missed HD PMH COPD, ESRD on HD TTS, DM2, seizures, fibrillary glomerulonephritis, ETOH abuse, Smoker, CAD, ACDF, chronic R parietal lobe infarct, peripheral neuropathy,dementia.    Clinical Impression  Pt in bed upon arrival of PT, agreeable to evaluation at this time. Prior to admission the pt was independent without DME, living in a home with 2 steps to enter with his mother, independent with all ADLs and IADLs. The pt was on 6L O2 upon my arrival, with SpO2 of 95% and reports no use of O2 at home. Was able to tolerated RA at rest with SpO2 90%, but desats quickly to low 80s with exertion. Required 2-3L O2 to maintain SpO2 >90% with exertion. The pt also presents with deficits in dynamic stability compounded by chronic weakness in LLE due to prior injury. Pt needed up to minA with gait, especially with balance challenge to maintain upright with balance. Will continue to benefit from skilled PT acutely and possibly post-acute PT to reduce risk of continued falls (pt with fall prior to admission), but anticipate he will be able to return home once medically stable.   SpO2 on RA at rest: 90% SpO2 on RA with 5x sit-stand: 83% SpO2 on 2L with 100 ft gait: 92% SpO2 on 2L with 200 ft gait: 89% SpO2 on 3L with 200 ft gait: 93%    If plan is discharge home, recommend the following: A little help with walking and/or transfers   Can travel by private vehicle        Equipment Recommendations None recommended by PT (pt has needed DME)  Recommendations for Other Services       Functional Status Assessment Patient has had a recent decline in their functional status and demonstrates  the ability to make significant improvements in function in a reasonable and predictable amount of time.     Precautions / Restrictions Precautions Precautions: Fall Recall of Precautions/Restrictions: Intact Precaution/Restrictions Comments: on 2-3L O2 Restrictions Weight Bearing Restrictions Per Provider Order: No      Mobility  Bed Mobility               General bed mobility comments: sitting EOB upon arrival    Transfers Overall transfer level: Needs assistance Equipment used: None Transfers: Sit to/from Stand Sit to Stand: Contact guard assist           General transfer comment: CGA for safety, pt dependent on UE support to power up. x5 sit-stand from EOB then from recliner    Ambulation/Gait Ambulation/Gait assistance: Min assist, Contact guard assist Gait Distance (Feet): 100 Feet (+100 ft + 300 ft) Assistive device: None Gait Pattern/deviations: Step-through pattern, Decreased stride length, Staggering right Gait velocity: decreased Gait velocity interpretation: <1.8 ft/sec, indicate of risk for recurrent falls   General Gait Details: pt with x3 LOB to R needing minA to recover, no UE support. reports he is at baseline pace     Balance Overall balance assessment: Needs assistance Sitting-balance support: No upper extremity supported, Feet supported Sitting balance-Leahy Scale: Good     Standing balance support: No upper extremity supported, During functional activity Standing balance-Leahy Scale: Fair  High level balance activites: Direction changes, Turns, Head turns High Level Balance Comments: up to min A to correct             Pertinent Vitals/Pain Pain Assessment Pain Assessment: No/denies pain    Home Living Family/patient expects to be discharged to:: Private residence Living Arrangements: Parent Available Help at Discharge: Family;Available 24 hours/day Type of Home: House Home Access: Stairs to  enter Entrance Stairs-Rails: None Entrance Stairs-Number of Steps: 1 + 1   Home Layout: One level Home Equipment: Agricultural Consultant (2 wheels);Rollator (4 wheels);Cane - single point      Prior Function Prior Level of Function : Independent/Modified Independent;Driving;History of Falls (last six months)             Mobility Comments: pt reports single fall in last 6 months, no exercise, sedentary both in home and goes on drives for fun. no use of DME ADLs Comments: pt reports independence. lives with his mother and does not have to help her but does IADLs     Extremity/Trunk Assessment   Upper Extremity Assessment Upper Extremity Assessment: Defer to OT evaluation    Lower Extremity Assessment Lower Extremity Assessment: Overall WFL for tasks assessed;LLE deficits/detail (RLE grossly 4+/5 other than R hip flexion 4-5) LLE Deficits / Details: hx of weakness after accident at work. grossly 4/5 LLE Sensation: WNL LLE Coordination: WNL    Cervical / Trunk Assessment Cervical / Trunk Assessment: Normal  Communication   Communication Communication: No apparent difficulties    Cognition Arousal: Alert Behavior During Therapy: WFL for tasks assessed/performed   PT - Cognitive impairments: No apparent impairments, History of cognitive impairments                       PT - Cognition Comments: WFL in session, not formally tested. Hx of dementia Following commands: Intact       Cueing Cueing Techniques: Verbal cues     General Comments General comments (skin integrity, edema, etc.): SpO2 90% on RA at rest, to low of 83% after 5x sit-stand on RA. 92% on 2L with gait but 89% on 2L with longer ambulation, tolerated well on 3L then bumped back to 2L at rest.    Exercises     Assessment/Plan    PT Assessment Patient needs continued PT services  PT Problem List Decreased strength;Decreased activity tolerance;Decreased balance;Decreased mobility       PT Treatment  Interventions DME instruction;Gait training;Stair training;Functional mobility training;Therapeutic activities;Therapeutic exercise;Balance training;Patient/family education    PT Goals (Current goals can be found in the Care Plan section)  Acute Rehab PT Goals Patient Stated Goal: to return home PT Goal Formulation: With patient Time For Goal Achievement: 06/24/24 Potential to Achieve Goals: Good    Frequency Min 2X/week        AM-PAC PT 6 Clicks Mobility  Outcome Measure Help needed turning from your back to your side while in a flat bed without using bedrails?: None Help needed moving from lying on your back to sitting on the side of a flat bed without using bedrails?: None Help needed moving to and from a bed to a chair (including a wheelchair)?: A Little Help needed standing up from a chair using your arms (e.g., wheelchair or bedside chair)?: A Little Help needed to walk in hospital room?: A Little Help needed climbing 3-5 steps with a railing? : A Little 6 Click Score: 20    End of Session Equipment Utilized During Treatment: Gait  belt;Oxygen  Activity Tolerance: Patient tolerated treatment well Patient left: in chair;with call bell/phone within reach;with chair alarm set Nurse Communication: Mobility status PT Visit Diagnosis: Unsteadiness on feet (R26.81);Muscle weakness (generalized) (M62.81);Difficulty in walking, not elsewhere classified (R26.2)    Time: 9140-9066 PT Time Calculation (min) (ACUTE ONLY): 34 min   Charges:   PT Evaluation $PT Eval Low Complexity: 1 Low PT Treatments $Therapeutic Exercise: 8-22 mins PT General Charges $$ ACUTE PT VISIT: 1 Visit         Izetta Call, PT, DPT   Acute Rehabilitation Department Office [phone] Secure Chat Communication Preferred  Izetta JULIANNA Call 06/10/2024, 9:53 AM

## 2024-06-10 NOTE — Progress Notes (Signed)
 SATURATION QUALIFICATIONS: (This note is used to comply with regulatory documentation for home oxygen )  Patient Saturations on Room Air at Rest = 90%  Patient Saturations on Room Air while Ambulating = 83%  Patient Saturations on 3 Liters of oxygen  while Ambulating = 93%  Please briefly explain why patient needs home oxygen : Pt needed up to 3L O2 to maintain SpO2 >90% with gait.   Izetta Call, PT, DPT   Acute Rehabilitation Department Office [phone] Secure Chat Communication Preferred

## 2024-06-10 NOTE — Progress Notes (Signed)
°  Progress Note  Patient Name: [name] Fedewa Sr. Date of Encounter: 06/10/2024 [facility] Cardiologist: Vinie JAYSON Maxcy, MD new  Interval Summary   Had periods of normal sinus rhythm yesterday.  PACs.  Finally reverted back to atrial fibrillation earlier today.  Seems to be paroxysmal.  Vital Signs Vitals:   06/10/24 0326 06/10/24 0339 06/10/24 0341 06/10/24 0744  BP:  (!) 148/84 (!) 148/84 136/66  Pulse:  80 80 87  Resp: 16 20 (!) 22 19  Temp:  98.1 F (36.7 C) 98.2 F (36.8 C) 98.3 F (36.8 C)  TempSrc:  Oral Oral Oral  SpO2:  91% 91% 91%  Weight:      Height:        Intake/Output Summary (Last 24 hours) at 06/10/2024 0815 Last data filed at 06/10/2024 0400 Gross per 24 hour  Intake 288.61 ml  Output 3500 ml  Net -3211.39 ml      06/09/2024    5:35 PM 06/09/2024    4:57 PM 06/09/2024   12:12 PM  Last 3 Weights  Weight (lbs) 171 lb 4.8 oz 172 lb 13.5 oz 180 lb 8.9 oz  Weight (kg) 77.7 kg 78.4 kg 81.9 kg      Telemetry/ECG  Converted to sinus rhythm yesterday, now having some more atrial fibrillation today.- Personally Reviewed  Physical Exam  GEN: No acute distress.  Somewhat disheveled. Neck: No JVD Cardiac: Irregularly irregular, no murmurs, rubs, or gallops.  Respiratory: Managed bases bilaterally. GI: Soft, nontender, non-distended, wearing adult diaper MS: No edema  Assessment & Plan   63 year old end-stage renal disease hemodialysis COPD tobacco use dementia hypertension stroke history type 2 diabetes alcohol  history peripheral neuropathy seen originally on 06/08/2024 for evaluation of new onset atrial fibrillation.  A-fib resolved on 06/08/2024 overnight.  On IV heparin .  However, reverted back to atrial fibrillation 12/15 AM.  Okay to transition to Eliquis .  Will order.  Since showing signs of sinus rhythm, no need for any further procedures.  On HD.  Dialysis yesterday.  Echo pending today.  Prior EF 45%.  Continue with fluid status based  upon hemodialysis.  Was on metoprolol  12.5 mg every 6 hours prior to conversion.  We will go ahead and change to 50 mg twice daily to help improve rate control.  Hyperkalemia previously 6.8, now 5.3 last check.  HD.     For questions or updates, please contact [facility] Please consult [email or web address] for contact info under         Signed, Oneil Parchment, MD

## 2024-06-10 NOTE — TOC Initial Note (Signed)
 Transition of Care (TOC) - Initial/Assessment Note    Patient Details  Name: [name] [name]. MRN: [id number] Date of Birth: January 31, 1961  Transition of Care [facility]) CM/SW Contact:    Roxie KANDICE Stain, RN Phone Number: 06/10/2024, 3:28 PM  Clinical Narrative:                 Spoke to patient regarding transition needs.  Patient lives with mother and has walker and cane.  Patient is agreeable to [facility] and home 02 from [facility]. Referral sent to [name] [name].  This RNCM offered choice for Home Health, [name] [name]. states he has no preference, RNCM made referral to [facility] with Hedda, He is able to take referral.  Patient has transportation to apts.  Address, Phone number and PCP verified.    Expected Discharge Plan: Home w Home Health Services Barriers to Discharge: Continued Medical Work up   Patient Goals and CMS Choice Patient states their goals for this hospitalization and ongoing recovery are:: return home CMS Medicare.gov Compare Post Acute Care list provided to:: Patient Choice offered to / list presented to : Patient      Expected Discharge Plan and Services   Discharge Planning Services: CM Consult Post Acute Care Choice: Durable Medical Equipment, Home Health Living arrangements for the past 2 months: Single Family Home                 DME Arranged: Oxygen , Bedside commode DME Agency: [employer] Date DME Agency Contacted: 06/10/24 Time DME Agency Contacted: 1527 Representative spoke with at DME Agency: [place] HH Arranged: PT HH Agency: [facility] Date [facility] Agency Contacted: 06/10/24 Time HH Agency Contacted: 1527 Representative spoke with at [facility] Agency: [name] [name]  Prior Living Arrangements/Services Living arrangements for the past 2 months: Single Family Home Lives with:: Parents Patient language and need for interpreter reviewed:: Yes Do you feel safe going back to the place where you live?: Yes      Need for Family Participation in Patient  Care: Yes (Comment) Care giver support system in place?: Yes (comment) Current home services: DME (walker, cane) Criminal Activity/Legal Involvement Pertinent to Current Situation/Hospitalization: No - Comment as needed  Activities of Daily Living   ADL Screening (condition at time of admission) Independently performs ADLs?: Yes (appropriate for developmental age) Is the patient deaf or have difficulty hearing?: No Does the patient have difficulty seeing, even when wearing glasses/contacts?: No Does the patient have difficulty concentrating, remembering, or making decisions?: No  Permission Sought/Granted Permission sought to share information with : Photographer granted to share info w AGENCY: home health, DME        Emotional Assessment Appearance:: Appears stated age Attitude/Demeanor/Rapport: Engaged Affect (typically observed): Accepting Orientation: : Oriented to Place, Oriented to Self, Oriented to  Time, Oriented to Situation Alcohol  / Substance Use: Not Applicable Psych Involvement: No (comment)  Admission diagnosis:  Shortness of breath [R06.02] Hyperkalemia [E87.5] Anxiety and depression [F41.9, F32.A] Acute hypoxemic respiratory failure (HCC) [J96.01] Dyspnea, unspecified type [R06.00] Patient Active Problem List   Diagnosis Date Noted   PAF (paroxysmal atrial fibrillation) (HCC) 06/09/2024   Acute hypoxemic respiratory failure (HCC) 06/08/2024   Hepatic cirrhosis, unspecified hepatic cirrhosis type, unspecified whether ascites present (HCC) 08/21/2023   Acute metabolic encephalopathy 05/09/2023   CKD (chronic kidney disease) stage 5, GFR less than 15 ml/min (HCC) 08/04/2022   ESRD (end stage renal disease) (HCC) 08/04/2022   Shakes 08/04/2022  Disorder of phosphorus metabolism, unspecified 07/28/2021   Bradycardia with 41-50 beats per minute 05/05/2021   Allergy, unspecified, initial encounter 03/26/2021   Anaphylactic  shock, unspecified, initial encounter 03/26/2021   Anemia in chronic kidney disease 03/26/2021   Complication of vascular dialysis catheter 03/26/2021   Diarrhea, unspecified 03/26/2021   Fever, unspecified 03/26/2021   Iron  deficiency anemia, unspecified 03/26/2021   Other specified coagulation defects 03/26/2021   Pruritus, unspecified 03/26/2021   Secondary hyperparathyroidism of renal origin 03/26/2021   Type 2 diabetes mellitus with diabetic peripheral angiopathy without gangrene (HCC) 03/26/2021   Atherosclerotic heart disease of native coronary artery without angina pectoris 03/26/2021   Shortness of breath 03/26/2021   Type 2 diabetes mellitus with other diabetic kidney complication (HCC) 03/26/2021   Arthritis 03/10/2021   CHF (congestive heart failure) (HCC) 03/10/2021   Cough 03/10/2021   Heart murmur 03/10/2021   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   Edema 07/20/2018   Acute renal failure superimposed on stage 2 chronic kidney disease 06/12/2018   Hyperkalemia 06/12/2018   Bilateral lower extremity edema 06/11/2018   Seizure (HCC) 10/18/2017   Alcohol  abuse 10/18/2017   Neuropathy 10/18/2017   Coronary artery calcification seen on CT scan 10/09/2017   Anxiety and depression 10/06/2017   Dyspnea 10/06/2017   CAD (coronary artery disease) 10/06/2017   COPD (chronic obstructive pulmonary disease) (HCC) 09/29/2017   Tobacco abuse 09/29/2017   Hypertension 09/29/2017   Myelopathy (HCC) 09/14/2017   Type 2 diabetes mellitus (HCC) 10/17/2011   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   PCP:  [name], MD Pharmacy:   [facility] [address], [address] [place] [address] Phone: [phone] Fax: [phone]  [id number] [place], [place] - [address] [place] [address] Phone: [phone] Fax: [phone]  [facility] [address] [place] [address] Phone: [phone] Fax: [phone]     Social Drivers of Health (SDOH) Social History: SDOH Screenings   Food Insecurity: No Food Insecurity (06/09/2024)  Housing: Low Risk (06/09/2024)  Transportation Needs: No Transportation Needs (06/09/2024)  Utilities: Not At Risk (06/09/2024)  Alcohol  Screen: Low Risk (09/19/2023)  Depression (PHQ2-9): Low Risk (09/19/2023)  Financial Resource Strain: High Risk (09/19/2023)  Physical Activity: Patient Declined (09/19/2023)  Social Connections: Socially Isolated (06/09/2024)  Stress: No Stress Concern Present (09/19/2023)  Tobacco Use: High Risk (06/09/2024)  Health Literacy: Adequate Health Literacy (09/19/2023)   SDOH Interventions:     Readmission Risk Interventions     No data to display

## 2024-06-10 NOTE — Progress Notes (Addendum)
 [facility] Progress Note   Subjective:    Seen and examined patient at bedside. Followed by Cardiology. Patient received an extra HD yesterday 2nd ongoing hyperkalemia. Tolerated net UF 3.5L. K+ 5.3 today and now on Lokelma  BID. Next HD 12/16.  Objective Vitals:   06/10/24 0745 06/10/24 0906 06/10/24 0907 06/10/24 1109  BP:  114/82 114/82 131/70  Pulse:   87 77  Resp:    18  Temp:    98.4 F (36.9 C)  TempSrc:    Oral  SpO2: 94%   (!) 89%  Weight:      Height:       Physical Exam General: alert male in NAD Heart: irregular rate and rhythm Lungs: CTA bilaterally Abdomen: soft non-distended Extremities: no edema Vascular Access: AVF  Filed Weights   06/09/24 1212 06/09/24 1657 06/09/24 1735  Weight: 81.9 kg 78.4 kg 77.7 kg    Intake/Output Summary (Last 24 hours) at 06/10/2024 1330 Last data filed at 06/10/2024 0700 Gross per 24 hour  Intake 408.61 ml  Output 3500 ml  Net -3091.39 ml    Additional Objective Labs: Basic Metabolic Panel: Recent Labs  Lab 06/08/24 2024 06/08/24 2220 06/09/24 0305 06/10/24 0339  NA 131* 126* 128* 133*  K 5.9* 5.7* 6.8* 5.3*  CL 87*  --  89* 94*  CO2 31  --  26 26  GLUCOSE 111*  --  231* 146*  BUN 28*  --  32* 25*  CREATININE 7.25*  --  7.86* 5.73*  CALCIUM  9.0  --  8.7* 9.0   Liver Function Tests: Recent Labs  Lab 06/08/24 0845 06/10/24 0339  AST 25 16  ALT 29 21  ALKPHOS 326* 232*  BILITOT 1.3* 0.6  PROT 7.0 6.2*  ALBUMIN 3.2* 2.6*   Recent Labs  Lab 06/08/24 0845  LIPASE 28   CBC: Recent Labs  Lab 06/08/24 0845 06/08/24 1955 06/08/24 2024 06/08/24 2220 06/09/24 0305 06/10/24 0339  WBC 12.0*  --  11.2*  --  8.5 12.9*  NEUTROABS 8.8*  --   --   --  7.8*  --   HGB 12.8*   < > 11.9* 12.2* 11.6* 10.7*  HCT 39.9   < > 36.5* 36.0* 35.3* 32.5*  MCV 84.2  --  84.3  --  84.4 83.1  PLT 429*  --  416*  --  416* 394   < > = values in this interval not displayed.   Blood Culture No results  found for: SDES, SPECREQUEST, CULT, REPTSTATUS  Cardiac Enzymes: No results for input(s): CKTOTAL, CKMB, CKMBINDEX, TROPONINI in the last 168 hours. CBG: Recent Labs  Lab 06/08/24 1101  GLUCAP 180*   Iron  Studies: No results for input(s): IRON , TIBC, TRANSFERRIN, FERRITIN in the last 72 hours. Lab Results  Component Value Date   INR 1.0 03/25/2021   INR 0.9 03/11/2019   Studies/Results: DG Chest 2 View Result Date: 06/09/2024 CLINICAL DATA:  Pulmonary edema. EXAM: CHEST - 2 VIEW COMPARISON:  06/08/2024 FINDINGS: The cardio pericardial silhouette is enlarged. Left base collapse/consolidation with moderate to large left pleural effusion is progressive in the interval. There is pulmonary vascular congestion without overt pulmonary edema. Probable skin fold over the right hemithorax. Telemetry leads overlie the chest. IMPRESSION: 1. Interval progression of left base collapse/consolidation with moderate to large left pleural effusion. 2. Pulmonary vascular congestion without overt pulmonary edema. 3. Probable skin fold overlies the lateral right lung with pneumothorax considered a less likely possibility given apparent lung  markings peripheral to the curvilinear finding. Right-side-up decubitus film could be used to further evaluate as clinically warranted. Electronically Signed   By: Camellia Candle M.D.   On: 06/09/2024 09:12    Medications:   amLODipine   5 mg Oral Daily   apixaban   5 mg Oral BID   Chlorhexidine  Gluconate Cloth  6 each Topical Q0600   Chlorhexidine  Gluconate Cloth  6 each Topical Q0600   citalopram   40 mg Oral Daily   doxycycline   100 mg Oral Q12H   ferric citrate   630 mg Oral BID WC   fluticasone  furoate-vilanterol  1 puff Inhalation Daily   ipratropium  0.5 mg Nebulization BID   levETIRAcetam   500 mg Oral BID   [START ON 06/11/2024] methylPREDNISolone  (SOLU-MEDROL ) injection  60 mg Intravenous Daily   metoprolol  tartrate  50 mg Oral BID    nicotine   14 mg Transdermal Daily   pregabalin   25 mg Oral QHS   rosuvastatin   10 mg Oral Daily   sodium zirconium cyclosilicate   10 g Oral BID   traZODone   50 mg Oral QHS    Dialysis Orders: [facility] tts 4 hours EDW 82.5kg BFR 425 DFR 800 avf 15g heparin  3000 unit bolus.   Assessment/Plan: 1. Hyperkalemia: Recurrent despite lokelma  and HD. On Lokekma BID. Reports he has not been meeting much. Follow trend. 2. ESRD:  Typically TTS schedule. Received an extra HD yesterday. Next HD 12/16. 3. HTN/volume: Pulmonary edema on CXR. Further UF with HD today.  4. Anemia: Hgb 11.6. No ESA indicated at this time.  5. Secondary hyperparathyroidism: Calcium  controlled, follow phos.  6. Nutrition: Currently NPO 7. A.fib: Cardiology onboard, possible echo and cardioversion planned.   Charmaine Piety, NP [facility] 06/10/2024,1:30 PM  LOS: 2 days

## 2024-06-10 NOTE — Plan of Care (Signed)
   Problem: Education: Goal: Knowledge of disease or condition will improve Outcome: Progressing Goal: Knowledge of the prescribed therapeutic regimen will improve Outcome: Progressing Goal: Individualized Educational Video(s) Outcome: Progressing   Problem: Activity: Goal: Ability to tolerate increased activity will improve Outcome: Progressing Goal: Will verbalize the importance of balancing activity with adequate rest periods Outcome: Progressing   Problem: Respiratory: Goal: Ability to maintain a clear airway will improve Outcome: Progressing Goal: Levels of oxygenation will improve Outcome: Progressing Goal: Ability to maintain adequate ventilation will improve Outcome: Progressing   Problem: Education: Goal: Knowledge of General Education information will improve Description: Including pain rating scale, medication(s)/side effects and non-pharmacologic comfort measures Outcome: Progressing   Problem: Health Behavior/Discharge Planning: Goal: Ability to manage health-related needs will improve Outcome: Progressing   Problem: Clinical Measurements: Goal: Ability to maintain clinical measurements within normal limits will improve Outcome: Progressing Goal: Will remain free from infection Outcome: Progressing Goal: Diagnostic test results will improve Outcome: Progressing Goal: Respiratory complications will improve Outcome: Progressing Goal: Cardiovascular complication will be avoided Outcome: Progressing   Problem: Activity: Goal: Risk for activity intolerance will decrease Outcome: Progressing   Problem: Nutrition: Goal: Adequate nutrition will be maintained Outcome: Progressing   Problem: Coping: Goal: Level of anxiety will decrease Outcome: Progressing   Problem: Elimination: Goal: Will not experience complications related to bowel motility Outcome: Progressing Goal: Will not experience complications related to urinary retention Outcome: Progressing    Problem: Pain Managment: Goal: General experience of comfort will improve and/or be controlled Outcome: Progressing   Problem: Safety: Goal: Ability to remain free from injury will improve Outcome: Progressing   Problem: Skin Integrity: Goal: Risk for impaired skin integrity will decrease Outcome: Progressing

## 2024-06-10 NOTE — Progress Notes (Signed)
 Progress Note   Patient: [name] Bagnell Sr. FMW:[id number] DOB: 1961-01-15 DOA: 06/08/2024     2 DOS: the patient was seen and examined on 06/10/2024   Brief hospital course: 63 y.o. male with medical history significant of ESRD on hemodialysis (TTS), COPD, not on oxygen , tobacco abuse, dementia, hypertension, history of stroke, type 2 diabetes, history of alcohol  abuse, peripheral neuropathy present here for shortness of breath.   History gathered from patient's daughters at the bedside.  They report that patient has having difficulty breathing since November.  He is not on oxygen  at home.  He missed his last dialysis appointment due to having loose stool since 4 to 5 days.  Has chronic cough with clear sputum associated with worsening shortness of breath and wheezing for the past couple of days.  No recent sick contact.  No fever, chills, headache, body ache, sore throat.   Has constipation therefore he took Dulcolax which caused him loose stools.   Family is concerned about his breathing and dark loose stools.  No abdominal pain, fever, chills, nausea, vomiting, over-the-counter NSAID use.  Family thinks that he needs to be on oxygen  at home.   He continues to smoke half a pack to 1 pack of cigarette per day.  He was with his mom.  He denies any illicit drug use.   Of note: Patient was supposed to be discharged after dialysis however due to worsening shortness of breath and drop in oxygen  saturation EDP requested admission.   ED Course: Upon arrival: Patient afebrile, pulse 68, RR: 16, BP 138/114, currently on 6 L of oxygen  via nasal cannula.  CBC shows WBC of 12.0, H&H 12.8/39.9, PLT 429.  NA: 126, K: 7.5, BUN 51, CR: 11.36, ALP: 326, T. bili: 1.3, AG: 17.  Chest x-ray shows enlargement of cardiopericardial silhouette with vascular congestion and diffuse interstitial opacities suggesting edema.  Left base collapse/consolidation with moderate left pleural effusion.  He underwent dialysis in ED.   His oxygen  saturation continued to drop.  Patient was given breathing treatment and methylprednisone 125 mg IV once in the ER.  [facility] hospitalist consulted for admission for acute hypoxemic respiratory failure in the setting of COPD exacerbation.  Assessment and Plan: Acute hypoxemic respiratory failure in the setting of COPD exacerbation and pulmonary edema in the setting of missed dialysis: - Patient has history of COPD.  Current everyday smoker.  Not on oxygen  at home.  Presented requiring 5 L of oxygen  via nasal cannula. - COVID flu RSV neg - continued on IV steroids with doxycycline  - DuoNebs scheduled as well as as needed -begin weaning steroids - Weaned to 2LNC. Pt will require home o2 on d/c.   ESRD on dialysis: - Missed dialysis due to not feeling well. -Underwent dialysis in ED. - Nephrology following. Cont HD as needed. Repeat HD session today noted   Hyperkalemia: - Repeat BMP shows potassium of 8.2 after dialysis -K improving with HD and lokelma    Loose stools: -Likely due to stool softener-Dulcolax. -Will hold off stool studies at this time.  No recent antibiotic use or travel.   Hypertension: Blood pressure is currently stable - Will continue to monitor.  Resume home BP meds.-Coreg  and amlodipine    Hyponatremia: -Likely in the setting of hypervolemia due to missed dialysis appointment - Underwent dialysis.  Will monitor closely   History of stroke: - Patient is at baseline.   Chronic combined CHF: - Bilateral 2+ pitting edema noted on exam.  Reviewed chest x-ray. - Monitor  signs for fluid overload. BNP significantly elevated.-Echo done 05/10/2023 that showed EF of 40 to 45% with grade 2 diastolic dysfunction.     History of dementia: -History of recurrent falls per patient's family.   - On fall precautions.   Diet controlled diabetes: - Last A1c 6.1%.  On Lyrica  for neuropathy - Repeat A1c.   Hyperlipidemia: Continue statin    Depression/anxiety/insomnia: - Continue Celexa  and trazodone    Anemia of chronic disease: - In the setting of ESRD. - H&H is stable.  Continue to monitor   Thrombocytosis: -Likely reactive.  Continue to monitor   History of seizures: Will continue current dose of Keppra .   Afib with rapid ventricular rate - EKG showed atrial fibrillation.  Heart rate in 120s. - Cardiology consulted with consideration for TEE cardioversion noted initially, however converted to sinus.  - cont metoprolol       Subjective: Reports breathing better today.   Physical Exam: Vitals:   06/10/24 0745 06/10/24 0906 06/10/24 0907 06/10/24 1109  BP:  114/82 114/82 131/70  Pulse:   87 77  Resp:    18  Temp:    98.4 F (36.9 C)  TempSrc:    Oral  SpO2: 94%   (!) 89%  Weight:      Height:       General exam: Conversant, in no acute distress Respiratory system: normal chest rise, clear, no audible wheezing Cardiovascular system: regular rhythm, s1-s2 Gastrointestinal system: Nondistended, nontender, pos BS Central nervous system: No seizures, no tremors Extremities: No cyanosis, no joint deformities Skin: No rashes, no pallor Psychiatry: Affect normal // no auditory hallucinations   Data Reviewed:  Labs reviewed: Na 133, K 5.3, WBC 12.9, Hgb 10.7, Plts 394   Family Communication: Pt in room, family not at bedside  Disposition: Status is: Inpatient Remains inpatient appropriate because: severity of illness  Planned Discharge Destination: Home    Author: Garnette Pelt, MD 06/10/2024 2:01 PM  For on call review [email or web address].

## 2024-06-10 NOTE — Telephone Encounter (Signed)
 Pharmacy Patient Advocate Encounter  Insurance verification completed.    The patient is insured through [employer]. Patient has Medicare and is not eligible for a copay card, but may be able to apply for patient assistance or Medicare RX Payment Plan (Patient Must reach out to their plan, if eligible for payment plan), if available.    Ran test claim for Eliquis  5mg  tablet and the current 30 day co-pay is $12.15.   This test claim was processed through [facility]- copay amounts may vary at other pharmacies due to pharmacy/plan contracts, or as the patient moves through the different stages of their insurance plan.

## 2024-06-11 ENCOUNTER — Other Ambulatory Visit (HOSPITAL_COMMUNITY): Payer: Self-pay

## 2024-06-11 LAB — COMPREHENSIVE METABOLIC PANEL WITH GFR
ALT: 22 U/L (ref 0–44)
AST: 17 U/L (ref 15–41)
Albumin: 2.7 g/dL — ABNORMAL LOW (ref 3.5–5.0)
Alkaline Phosphatase: 215 U/L — ABNORMAL HIGH (ref 38–126)
Anion gap: 12 (ref 5–15)
BUN: 52 mg/dL — ABNORMAL HIGH (ref 8–23)
CO2: 28 mmol/L (ref 22–32)
Calcium: 8.8 mg/dL — ABNORMAL LOW (ref 8.9–10.3)
Chloride: 91 mmol/L — ABNORMAL LOW (ref 98–111)
Creatinine, Ser: 8 mg/dL — ABNORMAL HIGH (ref 0.61–1.24)
GFR, Estimated: 7 mL/min — ABNORMAL LOW (ref >60)
Glucose, Bld: 138 mg/dL — ABNORMAL HIGH (ref 70–99)
Potassium: 5 mmol/L (ref 3.5–5.1)
Sodium: 131 mmol/L — ABNORMAL LOW (ref 135–145)
Total Bilirubin: 0.9 mg/dL (ref 0.0–1.2)
Total Protein: 6.4 g/dL — ABNORMAL LOW (ref 6.5–8.1)

## 2024-06-11 LAB — HEPATITIS B SURFACE ANTIBODY, QUANTITATIVE: Hep B S AB Quant (Post): 3559 m[IU]/mL

## 2024-06-11 LAB — CBC
HCT: 33.9 % — ABNORMAL LOW (ref 39.0–52.0)
Hemoglobin: 11 g/dL — ABNORMAL LOW (ref 13.0–17.0)
MCH: 27.2 pg (ref 26.0–34.0)
MCHC: 32.4 g/dL (ref 30.0–36.0)
MCV: 83.7 fL (ref 80.0–100.0)
Platelets: 418 K/uL — ABNORMAL HIGH (ref 150–400)
RBC: 4.05 MIL/uL — ABNORMAL LOW (ref 4.22–5.81)
RDW: 16.2 % — ABNORMAL HIGH (ref 11.5–15.5)
WBC: 14.3 K/uL — ABNORMAL HIGH (ref 4.0–10.5)
nRBC: 0 % (ref 0.0–0.2)

## 2024-06-11 LAB — PHOSPHORUS: Phosphorus: 5.7 mg/dL — ABNORMAL HIGH (ref 2.5–4.6)

## 2024-06-11 LAB — MAGNESIUM: Magnesium: 2.7 mg/dL — ABNORMAL HIGH (ref 1.7–2.4)

## 2024-06-11 MED ORDER — METOPROLOL TARTRATE 50 MG PO TABS
50.0000 mg | ORAL_TABLET | Freq: Two times a day (BID) | ORAL | 0 refills | Status: DC
  Filled 2024-06-11: qty 60, 30d supply, fill #0

## 2024-06-11 MED ORDER — LIDOCAINE HCL (PF) 1 % IJ SOLN: 5.0000 mL | INTRAMUSCULAR | Status: DC | PRN

## 2024-06-11 MED ORDER — DOXYCYCLINE HYCLATE 100 MG PO TABS
100.0000 mg | ORAL_TABLET | Freq: Two times a day (BID) | ORAL | 0 refills | Status: AC
  Filled 2024-06-11: qty 4, 2d supply, fill #0

## 2024-06-11 MED ORDER — HEPARIN SODIUM (PORCINE) 1000 UNIT/ML DIALYSIS: 1000.0000 [IU] | INTRAMUSCULAR | Status: DC | PRN

## 2024-06-11 MED ORDER — APIXABAN 5 MG PO TABS
5.0000 mg | ORAL_TABLET | Freq: Two times a day (BID) | ORAL | 0 refills | Status: DC
  Filled 2024-06-11: qty 60, 30d supply, fill #0

## 2024-06-11 MED ORDER — LIDOCAINE-PRILOCAINE 2.5-2.5 % EX CREA: 1.0000 | TOPICAL_CREAM | CUTANEOUS | Status: DC | PRN

## 2024-06-11 MED ORDER — PENTAFLUOROPROP-TETRAFLUOROETH EX AERO: 1.0000 | INHALATION_SPRAY | CUTANEOUS | Status: DC | PRN

## 2024-06-11 MED ORDER — ALTEPLASE 2 MG IJ SOLR: 2.0000 mg | Freq: Once | INTRAMUSCULAR | Status: DC | PRN

## 2024-06-11 MED ORDER — PREDNISONE 10 MG PO TABS
ORAL_TABLET | ORAL | 0 refills | Status: AC
  Filled 2024-06-11: qty 15, 8d supply, fill #0

## 2024-06-11 MED ORDER — ALBUTEROL SULFATE HFA 108 (90 BASE) MCG/ACT IN AERS
2.0000 | INHALATION_SPRAY | Freq: Four times a day (QID) | RESPIRATORY_TRACT | 0 refills | Status: DC | PRN
  Filled 2024-06-11: qty 18, 25d supply, fill #0

## 2024-06-11 MED ORDER — SODIUM ZIRCONIUM CYCLOSILICATE 10 G PO PACK: 10.0000 g | PACK | Freq: Two times a day (BID) | ORAL | Status: AC

## 2024-06-11 NOTE — Progress Notes (Signed)
 D.c orders noted. Contacted [facility] to inform of pt d/c plans and anticipated arrival back on Thursday, noted that pt was dialyzed on day of discharge. no further support needed.   Lavanda Jullien Granquist Dialysis Navigator [phone]

## 2024-06-11 NOTE — Discharge Summary (Signed)
 Physician Discharge Summary   Patient: [name] Smola Sr. MRN: [id number] DOB: [birth year]  Admit date:     06/08/2024  Discharge date: 06/11/2024  Discharge Physician: [name] [name]   PCP: [name] [name], [name]   Recommendations at discharge:    Follow up with PCP in 1-2 weeks Recommend outpatient sleep study  Discharge Diagnoses: Principal Problem:   Acute hypoxemic respiratory failure (HCC) Active Problems:   PAF (paroxysmal atrial fibrillation) (HCC)  Resolved Problems:   * No resolved hospital problems. *  Hospital Course: 63 y.o. male with medical history significant of ESRD on hemodialysis (TTS), COPD, not on oxygen , tobacco abuse, dementia, hypertension, history of stroke, type 2 diabetes, history of alcohol  abuse, peripheral neuropathy present here for shortness of breath.   History gathered from patient's daughters at the bedside.  They report that patient has having difficulty breathing since November.  He is not on oxygen  at home.  He missed his last dialysis appointment due to having loose stool since 4 to 5 days.  Has chronic cough with clear sputum associated with worsening shortness of breath and wheezing for the past couple of days.  No recent sick contact.  No fever, chills, headache, body ache, sore throat.   Has constipation therefore he took Dulcolax which caused him loose stools.   Family is concerned about his breathing and dark loose stools.  No abdominal pain, fever, chills, nausea, vomiting, over-the-counter NSAID use.  Family thinks that he needs to be on oxygen  at home.   He continues to smoke half a pack to 1 pack of cigarette per day.  He was with his mom.  He denies any illicit drug use.   Of note: Patient was supposed to be discharged after dialysis however due to worsening shortness of breath and drop in oxygen  saturation EDP requested admission.   ED Course: Upon arrival: Patient afebrile, pulse 68, RR: 16, BP 138/114, currently on 6 L of oxygen  via  nasal cannula.  CBC shows WBC of 12.0, H&H 12.8/39.9, PLT 429.  NA: 126, K: 7.5, BUN 51, CR: 11.36, ALP: 326, T. bili: 1.3, AG: 17.  Chest x-ray shows enlargement of cardiopericardial silhouette with vascular congestion and diffuse interstitial opacities suggesting edema.  Left base collapse/consolidation with moderate left pleural effusion.  He underwent dialysis in ED.  His oxygen  saturation continued to drop.  Patient was given breathing treatment and methylprednisone 125 mg IV once in the ER.  [facility] hospital[facility] consulted for admission for acute hypoxemic respiratory failure in the setting of COPD exacerbation.  Assessment and Plan: Acute hypoxemic respiratory failure in the setting of COPD exacerbation and pulmonary edema in the setting of missed dialysis: - Patient has history of COPD.  Current everyday smoker.  Not on oxygen  at home.  Presented requiring 5 L of oxygen  via nasal cannula. - COVID flu RSV neg - continued on IV steroids with doxycycline . - DuoNebs scheduled as well as as needed while in hospital - weaning steroids, prescribed prednisone  taper on d/c - Weaned to [facility]. Pt will require home o2 on d/c.   ESRD on dialysis: - Missed dialysis due to not feeling well. - Nephrology following. Cont HD per usual schedule   Hyperkalemia: - Repeat BMP shows potassium of 8.2 after dialysis -K improving with HD and bid lokelma  -Per Nephrology, recommendation to continue lokelma  on d/c   Loose stools: -Likely due to stool softener-Dulcolax. -Will hold off stool studies at this time.  No recent antibiotic use or travel.   Hypertension:  Blood pressure is currently stable - Will continue to monitor.  Resume home BP meds.-Coreg  and amlodipine    Hyponatremia: -Likely in the setting of hypervolemia due to missed dialysis appointment   History of stroke: - Patient is at baseline.   Chronic combined CHF: - Bilateral 2+ pitting edema noted on exam.  Reviewed chest x-ray. - Monitor signs  for fluid overload. BNP significantly elevated.-Echo done 05/10/2023 that showed EF of 40 to 45% with grade 2 diastolic dysfunction.     History of dementia: -History of recurrent falls per patient's family.   - On fall precautions.   Diet controlled diabetes: - Last A1c 6.1%.  On Lyrica  for neuropathy - Repeat A1c.   Hyperlipidemia: Continue statin   Depression/anxiety/insomnia: - Continue Celexa  and trazodone    Anemia of chronic disease: - In the setting of ESRD. - H&H is stable.  Continue to monitor   Thrombocytosis: -Likely reactive.  Continue to monitor   History of seizures: Will continue current dose of Keppra .   Afib with rapid ventricular rate - EKG showed atrial fibrillation.  Heart rate in 120s. - Cardiology consulted with consideration for TEE cardioversion noted initially, however converted to sinus.  - cont metoprolol  on d/c with eliquis     Consultants: Nephrology, Cardiology Procedures performed:   Disposition: Home Diet recommendation:  Renal diet DISCHARGE MEDICATION: Allergies as of 06/11/2024   No Known Allergies      Medication List     STOP taking these medications    carvedilol  6.25 MG tablet Commonly known as: COREG    cyclobenzaprine  5 MG tablet Commonly known as: FLEXERIL    hydrOXYzine  25 MG tablet Commonly known as: ATARAX        TAKE these medications    Accu-Chek Guide test strip Generic drug: glucose blood Use as instructed 3 times daily. Dx E11.22   Accu-Chek Guide Test test strip Generic drug: glucose blood Use as instructed 3 times daily. Dx E11.22   Accu-Chek Guide w/Device Kit Use as directed 3 times daily.   Accu-Chek Softclix Lancets lancets Use as instructed tid before meals. Dx E11.22   albuterol  108 (90 Base) MCG/ACT inhaler Commonly known as: Ventolin  HFA Inhale 2 puffs into the lungs every 6 (six) hours as needed for COPD.   amLODipine  5 MG tablet Commonly known as: NORVASC  Take 5 mg by mouth  daily.   apixaban  5 MG Tabs tablet Commonly known as: ELIQUIS  Take 1 tablet (5 mg total) by mouth 2 (two) times daily.   Auryxia  1 GM 210 MG(Fe) tablet Generic drug: ferric citrate  Take 630 mg by mouth 2 (two) times daily. and one with snacks   citalopram  40 MG tablet Commonly known as: CELEXA  Take 1 tablet (40 mg total) by mouth daily.Must have office visit for refills   dicyclomine  10 MG capsule Commonly known as: BENTYL  Take 1 capsule (10 mg total) by mouth 3 (three) times daily as needed for spasms (Diarrhea).   doxycycline  100 MG tablet Commonly known as: VIBRA -TABS Take 1 tablet (100 mg total) by mouth every 12 (twelve) hours for 2 days.   fluticasone -salmeterol 100-50 MCG/ACT Aepb Commonly known as: ADVAIR  Inhale 1 puff into the lungs 2 (two) times daily.   levETIRAcetam  500 MG tablet Commonly known as: KEPPRA  Take 1 tablet (500 mg total) by mouth 2 (two) times daily.   metoprolol  tartrate 50 MG tablet Commonly known as: LOPRESSOR  Take 1 tablet (50 mg total) by mouth 2 (two) times daily.   predniSONE  10 MG tablet Commonly known as:  DELTASONE  Take 4 tablets (40 mg total) by mouth daily for 2 days, THEN 2 tablets (20 mg total) daily for 2 days, THEN 1 tablet (10 mg total) daily for 2 days, THEN 0.5 tablets (5 mg total) daily for 2 days. Start taking on: June 12, 2024   pregabalin  25 MG capsule Commonly known as: LYRICA  Take 1 capsule (25 mg total) by mouth at bedtime.   rosuvastatin  10 MG tablet Commonly known as: CRESTOR  Take 1 tablet (10 mg total) by mouth daily.   sodium zirconium cyclosilicate  10 g Pack packet Commonly known as: LOKELMA  Take 10 g by mouth 2 (two) times daily. What changed: when to take this   traZODone  50 MG tablet Commonly known as: DESYREL  Take 1 tablet (50 mg total) by mouth at bedtime.               Durable Medical Equipment  (From admission, onward)           Start     Ordered   06/10/24 1324  For home use only  DME Bedside commode  Once       Question:  Patient needs a bedside commode to treat with the following condition  Answer:  Physical deconditioning   06/10/24 1323   06/10/24 1323  For home use only DME oxygen   Once       Question Answer Comment  Length of Need Lifetime   Mode or (Route) Nasal cannula   Liters per Minute 3   Frequency Continuous (stationary and portable oxygen  unit needed)   Oxygen  conserving device Yes   Oxygen  delivery system: Gas   Oxygen  delivery system: Concentrator      06/10/24 1323            Follow-up Information     [facility] Follow up.   Specialty: Home Health Services Why: Home health has been arranged. They will contact you to schedule apt Contact information: [address] [place] [address] [phone]         [facility] (DME) Follow up.   Specialty: DME Services Why: home oxygen  Contact information: [address] [place]  [address] [phone]        [name] [name], [name] Follow up in 2 day(s).   Specialty: Family Medicine Why: Hospital follow up Contact information: [address] [place] [address] [phone]                Discharge Exam: Fredricka Weights   06/09/24 1735 06/11/24 0308 06/11/24 0759  Weight: 77.7 kg 80.6 kg 80.1 kg   General exam: Awake, laying in bed, in nad Respiratory system: Normal respiratory effort, no wheezing Cardiovascular system: regular rate, s1, s2 Gastrointestinal system: Soft, nondistended, positive BS Central nervous system: CN2-12 grossly intact, strength intact Extremities: Perfused, no clubbing Skin: Normal skin turgor, no notable skin lesions seen Psychiatry: Mood normal // no visual hallucinations   Condition at discharge: fair  The results of significant diagnostics from this hospitalization (including imaging, microbiology, ancillary and laboratory) are listed below for reference.    Imaging Studies: ECHOCARDIOGRAM COMPLETE Result Date: 06/10/2024    ECHOCARDIOGRAM REPORT   Patient Name:   [name] Biegler Sr. Date of Exam: 06/10/2024 Medical Rec #:  [id number]         Height:       74.0 in Accession #:    [id number]        Weight:  171.3 lb Date of Birth:  Jun 13, 1961         BSA:          2.035 m Patient Age:    63 years          BP:           131/70 mmHg Patient Gender: M                 HR:           98 bpm. Exam Location:  Inpatient Procedure: 2D Echo, Cardiac Doppler and Color Doppler (Both Spectral and Color            Flow Doppler were utilized during procedure). Indications:    CHF  History:        Patient has prior history of Echocardiogram examinations, most                 recent 05/10/2023. CHF, CAD, COPD, Arrythmias:Atrial                 Fibrillation, Signs/Symptoms:Dyspnea; Risk Factors:Hypertension.  Sonographer:    Sherlean Dubin Referring Phys: RINKA R PAHWANI IMPRESSIONS  1. Left ventricular ejection fraction, by estimation, is 40 to 45%. Left ventricular ejection fraction by 2D MOD biplane is 47.7 %. The left ventricle has mildly decreased function. The left ventricle demonstrates global hypokinesis. Elevated left atrial pressure.  2. Right ventricular systolic function is normal. The right ventricular size is normal.  3. Left atrial size was moderately dilated.  4. Large pleural effusion in the left lateral region.  5. The mitral valve is normal in structure. No evidence of mitral valve regurgitation. No evidence of mitral stenosis.  6. The aortic valve is normal in structure. Aortic valve regurgitation is not visualized. Mild to moderate aortic valve stenosis. Aortic valve area, by VTI measures 1.36 cm. Aortic valve mean gradient measures 14.0 mmHg. Aortic valve Vmax measures 2.42  m/s.  7. The inferior vena cava is dilated in size with <50% respiratory variability, suggesting right atrial pressure of 15 mmHg. Comparison(s): Changes from prior study are noted. EF  is similar but IVC is dilated. FINDINGS  Left Ventricle: Left ventricular ejection fraction, by estimation, is 40 to 45%. Left ventricular ejection fraction by 2D MOD biplane is 47.7 %. The left ventricle has mildly decreased function. The left ventricle demonstrates global hypokinesis. The left ventricular internal cavity size was normal in size. There is no left ventricular hypertrophy. Left ventricular diastolic function could not be evaluated due to atrial fibrillation. Elevated left atrial pressure. Right Ventricle: The right ventricular size is normal. No increase in right ventricular wall thickness. Right ventricular systolic function is normal. Left Atrium: Left atrial size was moderately dilated. Right Atrium: Right atrial size was normal in size. Pericardium: There is no evidence of pericardial effusion. Mitral Valve: The mitral valve is normal in structure. No evidence of mitral valve regurgitation. No evidence of mitral valve stenosis. Tricuspid Valve: The tricuspid valve is normal in structure. Tricuspid valve regurgitation is not demonstrated. No evidence of tricuspid stenosis. Aortic Valve: The aortic valve is normal in structure. Aortic valve regurgitation is not visualized. Mild to moderate aortic stenosis is present. Aortic valve mean gradient measures 14.0 mmHg. Aortic valve peak gradient measures 23.4 mmHg. Aortic valve area, by VTI measures 1.36 cm. Pulmonic Valve: The pulmonic valve was not well visualized. Pulmonic valve regurgitation is not visualized. Aorta: The aortic root and ascending aorta are structurally normal, with no evidence of dilitation.  Venous: The inferior vena cava is dilated in size with less than 50% respiratory variability, suggesting right atrial pressure of 15 mmHg. IAS/Shunts: No atrial level shunt detected by color flow Doppler. Additional Comments: There is a large pleural effusion in the left lateral region.  LEFT VENTRICLE PLAX 2D                        Biplane EF  (MOD) LVIDd:         5.20 cm         LV Biplane EF:   Left LVIDs:         4.20 cm                          ventricular LV PW:         0.90 cm                          ejection LV IVS:        1.00 cm                          fraction by LVOT diam:     1.70 cm                          2D MOD LV SV:         57                               biplane is LV SV Index:   28                               47.7 %. LVOT Area:     2.27 cm                                Diastology                                LV e' medial:    7.23 cm/s LV Volumes (MOD)               LV E/e' medial:  15.8 LV vol d, MOD    206.8 ml      LV e' lateral:   5.73 cm/s A2C:                           LV E/e' lateral: 19.9 LV vol d, MOD    147.5 ml A4C: LV vol s, MOD    88.2 ml A2C: LV vol s, MOD    101.3 ml A4C: LV SV MOD A2C:   118.6 ml LV SV MOD A4C:   147.5 ml LV SV MOD BP:    86.3 ml RIGHT VENTRICLE             IVC RV Basal diam:  3.20 cm     IVC diam: 1.70 cm RV Mid diam:    2.60 cm RV S prime:     10.10 cm/s TAPSE (M-mode): 1.8 cm LEFT ATRIUM  Index        RIGHT ATRIUM           Index LA diam:        4.00 cm 1.97 cm/m   RA Area:     20.20 cm LA Vol (A2C):   98.8 ml 48.55 ml/m  RA Volume:   58.60 ml  28.80 ml/m LA Vol (A4C):   76.6 ml 37.64 ml/m LA Biplane Vol: 89.1 ml 43.78 ml/m  AORTIC VALVE AV Area (Vmax):    1.36 cm AV Area (Vmean):   1.48 cm AV Area (VTI):     1.36 cm AV Vmax:           242.00 cm/s AV Vmean:          142.500 cm/s AV VTI:            0.415 m AV Peak Grad:      23.4 mmHg AV Mean Grad:      14.0 mmHg LVOT Vmax:         145.50 cm/s LVOT Vmean:        93.200 cm/s LVOT VTI:          0.249 m LVOT/AV VTI ratio: 0.60  AORTA Ao Root diam: 2.60 cm Ao Asc diam:  3.60 cm MITRAL VALVE MV Area (PHT): 5.54 cm     SHUNTS MV Decel Time: 137 msec     Systemic VTI:  0.25 m MV E velocity: 114.00 cm/s  Systemic Diam: 1.70 cm MV A velocity: 82.70 cm/s MV E/A ratio:  1.38 Franck Azobou Tonleu Electronically signed by Joelle Ren Ny Signature Date/Time: 06/10/2024/4:08:51 PM    Final    DG Chest 2 View Result Date: 06/09/2024 CLINICAL DATA:  Pulmonary edema. EXAM: CHEST - 2 VIEW COMPARISON:  06/08/2024 FINDINGS: The cardio pericardial silhouette is enlarged. Left base collapse/consolidation with moderate to large left pleural effusion is progressive in the interval. There is pulmonary vascular congestion without overt pulmonary edema. Probable skin fold over the right hemithorax. Telemetry leads overlie the chest. IMPRESSION: 1. Interval progression of left base collapse/consolidation with moderate to large left pleural effusion. 2. Pulmonary vascular congestion without overt pulmonary edema. 3. Probable skin fold overlies the lateral right lung with pneumothorax considered a less likely possibility given apparent lung markings peripheral to the curvilinear finding. Right-side-up decubitus film could be used to further evaluate as clinically warranted. Electronically Signed   By: Camellia Candle M.D.   On: 06/09/2024 09:12   DG Chest Portable 1 View Result Date: 06/08/2024 CLINICAL DATA:  Shortness of breath. EXAM: PORTABLE CHEST 1 VIEW COMPARISON:  05/09/2023 FINDINGS: The cardio pericardial silhouette is enlarged. Vascular congestion with diffuse interstitial opacity suggests edema. Left base collapse/consolidation with moderate left pleural effusion. Hazy opacity at the right base suggests layering effusion. Telemetry leads overlie the chest. IMPRESSION: 1. Enlargement of the cardiopericardial silhouette with vascular congestion and diffuse interstitial opacity suggesting edema. 2. Left base collapse/consolidation with moderate left pleural effusion. 3. Layering small right effusion. Electronically Signed   By: Camellia Candle M.D.   On: 06/08/2024 09:53   PERIPHERAL VASCULAR CATHETERIZATION Result Date: 05/17/2024 Images from the original result were not included.   Patient name: Lavarr President Sr.         MRN: [id number]         DOB: Jan 21, 1961          Sex: male  05/17/2024 Pre-operative Diagnosis: Malfunction of right arm brachiobasilic AV fistula in the setting of ESRD  Post-operative diagnosis:  Same Surgeon:  Lonni DOROTHA Gaskins, [name] Procedure Performed: 1.  Ultrasound-guided access right brachiobasilic AV fistula 2.  Right upper extremity fistulogram including central venogram 3.  Peripheral angioplasty right upper arm basilic vein fistula (8 mm and 9 mm x 80 mm Mustang)  Indications: 63 year old male using a right arm basilic vein fistula in the setting of end-stage renal disease.  He presents for right arm fistulogram possible intervention after risks benefits discussed.  Findings:  Ultrasound-guided access right basilic vein fistula.  No evidence of central venous stenosis.  There was an 80% stenosis in the mid upper arm between 2 aneurysmal segments.  Ultimately this was treated with 8 mm and then 9 mm Mustang with excellent results.  Great thrill at completion.             Procedure:  The patient was identified in the holding area and taken to [facility] lab.  Patient was placed on the table in supine position.  The right arm was prepped draped standard sterile fashion.  Timeout performed.  I injected 1% lidocaine  in the skin over the fistula without epinephrine .  Then accessed this under ultrasound guidance with micro access needle placed a microwire and micro sheath.  Right extremity fistulogram was obtained including central venogram.  Elected for intervention.  Bentson wire was used exchanged for a short 6 French sheath.  The stenosis identified was treated initially with an 8 mm x 80 mm Mustang to nominal pressure for 2 minutes.  There was still some residual stenosis or upsized to a 9 mm x 80 mm thing to nominal pressure for 2 minutes.  Great results.  Great thrill.  Wires and catheters were removed.  Pursestring was tied around the sheath and this was removed.  Taken holding in stable condition.     Lonni DOROTHA Gaskins, [name]  [facility] Office: [phone]    Microbiology: Results for orders placed or performed during the hospital encounter of 06/08/24  Resp panel by RT-PCR (RSV, Flu A&B, Covid) Anterior Nasal Swab     Status: None   Collection Time: 06/08/24  8:24 PM   Specimen: Anterior Nasal Swab  Result Value Ref Range Status   SARS Coronavirus 2 by RT PCR NEGATIVE NEGATIVE Final   Influenza A by PCR NEGATIVE NEGATIVE Final   Influenza B by PCR NEGATIVE NEGATIVE Final    Comment: (NOTE) The Xpert Xpress SARS-CoV-2/FLU/RSV plus assay is intended as an aid in the diagnosis of influenza from Nasopharyngeal swab specimens and should not be used as a sole basis for treatment. Nasal washings and aspirates are unacceptable for Xpert Xpress SARS-CoV-2/FLU/RSV testing.  Fact Sheet for Patients: bloggercourse.com  Fact Sheet for Healthcare Providers: seriousbroker.it  This test is not yet approved or cleared by the [place]  FDA and has been authorized for detection and/or diagnosis of SARS-CoV-2 by FDA under an Emergency Use Authorization (EUA). This EUA will remain in effect (meaning this test can be used) for the duration of the COVID-19 declaration under Section 564(b)(1) of the Act, 21 U.S.C. section 360bbb-3(b)(1), unless the authorization is terminated or revoked.     Resp Syncytial Virus by PCR NEGATIVE NEGATIVE Final    Comment: (NOTE) Fact Sheet for Patients: bloggercourse.com  Fact Sheet for Healthcare Providers: seriousbroker.it  This test is not yet approved or cleared by the [place]  FDA and has been authorized for detection and/or diagnosis of SARS-CoV-2 by FDA under an Emergency Use Authorization (EUA). This  EUA will remain in effect (meaning this test can be used) for the duration of the COVID-19 declaration under Section 564(b)(1) of the Act,  21 U.S.C. section 360bbb-3(b)(1), unless the authorization is terminated or revoked.  Performed at [facility], [address], [place], [place] [address]   MRSA Next Gen by PCR, Nasal     Status: None   Collection Time: 06/09/24  5:50 PM   Specimen: Nasal Mucosa; Nasal Swab  Result Value Ref Range Status   MRSA by PCR Next Gen NOT DETECTED NOT DETECTED Final    Comment: (NOTE) The GeneXpert MRSA Assay (FDA approved for NASAL specimens only), is one component of a comprehensive MRSA colonization surveillance program. It is not intended to diagnose MRSA infection nor to guide or monitor treatment for MRSA infections. Test performance is not FDA approved in patients less than 63 years old. Performed at [facility], [address], [place], [place] [address]     Labs: CBC: Recent Labs  Lab 06/08/24 0845 06/08/24 1955 06/08/24 2024 06/08/24 2220 06/09/24 0305 06/10/24 0339 06/11/24 0525  WBC 12.0*  --  11.2*  --  8.5 12.9* 14.3*  NEUTROABS 8.8*  --   --   --  7.8*  --   --   HGB 12.8*   < > 11.9* 12.2* 11.6* 10.7* 11.0*  HCT 39.9   < > 36.5* 36.0* 35.3* 32.5* 33.9*  MCV 84.2  --  84.3  --  84.4 83.1 83.7  PLT 429*  --  416*  --  416* 394 418*   < > = values in this interval not displayed.   Basic Metabolic Panel: Recent Labs  Lab 06/08/24 0845 06/08/24 1955 06/08/24 2024 06/08/24 2220 06/09/24 0305 06/10/24 0339 06/11/24 0525  NA 126* 133* 131* 126* 128* 133* 131*  K >7.5* 8.2* 5.9* 5.7* 6.8* 5.3* 5.0  CL 85* 107 87*  --  89* 94* 91*  CO2 24  --  31  --  26 26 28   GLUCOSE 137* 79 111*  --  231* 146* 138*  BUN 51* 30* 28*  --  32* 25* 52*  CREATININE 11.36* 5.30* 7.25*  --  7.86* 5.73* 8.00*  CALCIUM  8.7*  --  9.0  --  8.7* 9.0 8.8*  MG  --   --  2.6*  --   --   --  2.7*  PHOS  --   --   --   --   --   --  5.7*   Liver Function Tests: Recent Labs  Lab 06/08/24 0845 06/10/24 0339 06/11/24 0525  AST 25 16 17   ALT 29 21 22   ALKPHOS 326*  232* 215*  BILITOT 1.3* 0.6 0.9  PROT 7.0 6.2* 6.4*  ALBUMIN 3.2* 2.6* 2.7*   CBG: Recent Labs  Lab 06/08/24 1101  GLUCAP 180*    Discharge time spent: less than 30 minutes.  Signed: Garnette [name], [name] [facility] 06/11/2024

## 2024-06-11 NOTE — Progress Notes (Signed)
 Echocardiogram stable.  EF 45%. Mild to moderate aortic stenosis.  At this point of no clinical significance. Now on apixaban  5 mg twice a day Metoprolol  50 twice daily  No new changes from yesterday's note.  We will go ahead and sign off.  Please let us  know if we can be of further assistance.

## 2024-06-11 NOTE — Progress Notes (Signed)
 Received patient in bed.Alert and oriented x 4.Consent for treatment verified.  Access used: Right avf that worked well.  Duration of treatment : 3.5 hours,.  U fgoal: Met 2 liters ,he tolerated treatment.  Hand off to the patient's nurse with stable vitals via transporter.

## 2024-06-11 NOTE — Progress Notes (Incomplete)
Patient provided with verbal discharge instructions. Paper copy of discharge provided to patient. RN answered all questions. VSS at discharge. IV removed. Patient belongings sent with patient. Patient dc'd via

## 2024-06-11 NOTE — Discharge Planning (Signed)
 [facility] Patient Discharge Orders- [facility] CLINIC: [name]  Patient's name: [name] Tulloch Sr. Admit/DC Dates: 06/08/2024 - 06/11/2024  Discharge Diagnoses: Acute hypoxic respiratory failure - 2nd pulmonary edema Hyperkalemia - despite HD here, now on Lokelma  BID  Aranesp: Given: No    Last Hgb: 11.0 PRBC's Given: No ESA dose for discharge: mircera 30 mcg IV q 2 weeks. Hold ESA for Hgb > 12 IV Iron  dose at discharge: N/A  Heparin  change: No  EDW Change: Yes   New EDW: Lower to 80kg. Patient was leaving under EDW here.  Bath Change: No  Access intervention/Change: No   Calcitriol  change: No  Discharge Labs: Calcium  8.8  Phosphorus 5.7  Albumin 2.7  K+ 5.0  IV Antibiotics: No  On Coumadin?: No, on Eliquis   Please check weekly K+ levels for now. Patient is on Lokelma  BID     D/C Meds to be reconciled by nurse after every discharge.  Completed By: Charmaine Piety, NP   Reviewed by: MD:______ RN_______

## 2024-06-11 NOTE — Progress Notes (Signed)
 [facility] Progress Note   Subjective:    Seen and examined patient on HD. Tolerating treatment and BP is 139/75. He reports supposed to be discharged with home O2. K+ 5-on Lokelma  BID.  Objective Vitals:   06/11/24 0759 06/11/24 0804 06/11/24 0830 06/11/24 0900  BP: (!) 141/73 (!) 140/68 (!) 142/71 139/75  Pulse: 71 66 70 71  Resp: 18 (!) 22 18 19   Temp: 97.8 F (36.6 C)     TempSrc:      SpO2: 93% 92% 92% 92%  Weight: 80.1 kg     Height:       Physical Exam General: alert male in NAD Heart: irregular rate and rhythm Lungs: CTA bilaterally Abdomen: soft non-distended Extremities: no edema Vascular Access: AVF  Filed Weights   06/09/24 1735 06/11/24 0308 06/11/24 0759  Weight: 77.7 kg 80.6 kg 80.1 kg    Intake/Output Summary (Last 24 hours) at 06/11/2024 0923 Last data filed at 06/11/2024 0746 Gross per 24 hour  Intake 360 ml  Output --  Net 360 ml    Additional Objective Labs: Basic Metabolic Panel: Recent Labs  Lab 06/09/24 0305 06/10/24 0339 06/11/24 0525  NA 128* 133* 131*  K 6.8* 5.3* 5.0  CL 89* 94* 91*  CO2 26 26 28   GLUCOSE 231* 146* 138*  BUN 32* 25* 52*  CREATININE 7.86* 5.73* 8.00*  CALCIUM  8.7* 9.0 8.8*  PHOS  --   --  5.7*   Liver Function Tests: Recent Labs  Lab 06/08/24 0845 06/10/24 0339 06/11/24 0525  AST 25 16 17   ALT 29 21 22   ALKPHOS 326* 232* 215*  BILITOT 1.3* 0.6 0.9  PROT 7.0 6.2* 6.4*  ALBUMIN 3.2* 2.6* 2.7*   Recent Labs  Lab 06/08/24 0845  LIPASE 28   CBC: Recent Labs  Lab 06/08/24 0845 06/08/24 1955 06/08/24 2024 06/08/24 2220 06/09/24 0305 06/10/24 0339 06/11/24 0525  WBC 12.0*  --  11.2*  --  8.5 12.9* 14.3*  NEUTROABS 8.8*  --   --   --  7.8*  --   --   HGB 12.8*   < > 11.9*   < > 11.6* 10.7* 11.0*  HCT 39.9   < > 36.5*   < > 35.3* 32.5* 33.9*  MCV 84.2  --  84.3  --  84.4 83.1 83.7  PLT 429*  --  416*  --  416* 394 418*   < > = values in this interval not displayed.   Blood  Culture No results found for: SDES, SPECREQUEST, CULT, REPTSTATUS  Cardiac Enzymes: No results for input(s): CKTOTAL, CKMB, CKMBINDEX, TROPONINI in the last 168 hours. CBG: Recent Labs  Lab 06/08/24 1101  GLUCAP 180*   Iron  Studies: No results for input(s): IRON , TIBC, TRANSFERRIN, FERRITIN in the last 72 hours. Lab Results  Component Value Date   INR 1.0 03/25/2021   INR 0.9 03/11/2019   Studies/Results: ECHOCARDIOGRAM COMPLETE Result Date: 06/10/2024    ECHOCARDIOGRAM REPORT   Patient Name:   [name] Scullion Sr. Date of Exam: 06/10/2024 Medical Rec #:  [id number]         Height:       74.0 in Accession #:    [id number]        Weight:       171.3 lb Date of Birth:  27-Oct-1960         BSA:          2.035 m Patient Age:    63 years  BP:           131/70 mmHg Patient Gender: M                 HR:           98 bpm. Exam Location:  Inpatient Procedure: 2D Echo, Cardiac Doppler and Color Doppler (Both Spectral and Color            Flow Doppler were utilized during procedure). Indications:    CHF  History:        Patient has prior history of Echocardiogram examinations, most                 recent 05/10/2023. CHF, CAD, COPD, Arrythmias:Atrial                 Fibrillation, Signs/Symptoms:Dyspnea; Risk Factors:Hypertension.  Sonographer:    Sherlean Dubin Referring Phys: RINKA R PAHWANI IMPRESSIONS  1. Left ventricular ejection fraction, by estimation, is 40 to 45%. Left ventricular ejection fraction by 2D MOD biplane is 47.7 %. The left ventricle has mildly decreased function. The left ventricle demonstrates global hypokinesis. Elevated left atrial pressure.  2. Right ventricular systolic function is normal. The right ventricular size is normal.  3. Left atrial size was moderately dilated.  4. Large pleural effusion in the left lateral region.  5. The mitral valve is normal in structure. No evidence of mitral valve regurgitation. No evidence of mitral stenosis.  6. The  aortic valve is normal in structure. Aortic valve regurgitation is not visualized. Mild to moderate aortic valve stenosis. Aortic valve area, by VTI measures 1.36 cm. Aortic valve mean gradient measures 14.0 mmHg. Aortic valve Vmax measures 2.42  m/s.  7. The inferior vena cava is dilated in size with <50% respiratory variability, suggesting right atrial pressure of 15 mmHg. Comparison(s): Changes from prior study are noted. EF is similar but IVC is dilated. FINDINGS  Left Ventricle: Left ventricular ejection fraction, by estimation, is 40 to 45%. Left ventricular ejection fraction by 2D MOD biplane is 47.7 %. The left ventricle has mildly decreased function. The left ventricle demonstrates global hypokinesis. The left ventricular internal cavity size was normal in size. There is no left ventricular hypertrophy. Left ventricular diastolic function could not be evaluated due to atrial fibrillation. Elevated left atrial pressure. Right Ventricle: The right ventricular size is normal. No increase in right ventricular wall thickness. Right ventricular systolic function is normal. Left Atrium: Left atrial size was moderately dilated. Right Atrium: Right atrial size was normal in size. Pericardium: There is no evidence of pericardial effusion. Mitral Valve: The mitral valve is normal in structure. No evidence of mitral valve regurgitation. No evidence of mitral valve stenosis. Tricuspid Valve: The tricuspid valve is normal in structure. Tricuspid valve regurgitation is not demonstrated. No evidence of tricuspid stenosis. Aortic Valve: The aortic valve is normal in structure. Aortic valve regurgitation is not visualized. Mild to moderate aortic stenosis is present. Aortic valve mean gradient measures 14.0 mmHg. Aortic valve peak gradient measures 23.4 mmHg. Aortic valve area, by VTI measures 1.36 cm. Pulmonic Valve: The pulmonic valve was not well visualized. Pulmonic valve regurgitation is not visualized. Aorta: The  aortic root and ascending aorta are structurally normal, with no evidence of dilitation. Venous: The inferior vena cava is dilated in size with less than 50% respiratory variability, suggesting right atrial pressure of 15 mmHg. IAS/Shunts: No atrial level shunt detected by color flow Doppler. Additional Comments: There is a large pleural effusion in the  left lateral region.  LEFT VENTRICLE PLAX 2D                        Biplane EF (MOD) LVIDd:         5.20 cm         LV Biplane EF:   Left LVIDs:         4.20 cm                          ventricular LV PW:         0.90 cm                          ejection LV IVS:        1.00 cm                          fraction by LVOT diam:     1.70 cm                          2D MOD LV SV:         57                               biplane is LV SV Index:   28                               47.7 %. LVOT Area:     2.27 cm                                Diastology                                LV e' medial:    7.23 cm/s LV Volumes (MOD)               LV E/e' medial:  15.8 LV vol d, MOD    206.8 ml      LV e' lateral:   5.73 cm/s A2C:                           LV E/e' lateral: 19.9 LV vol d, MOD    147.5 ml A4C: LV vol s, MOD    88.2 ml A2C: LV vol s, MOD    101.3 ml A4C: LV SV MOD A2C:   118.6 ml LV SV MOD A4C:   147.5 ml LV SV MOD BP:    86.3 ml RIGHT VENTRICLE             IVC RV Basal diam:  3.20 cm     IVC diam: 1.70 cm RV Mid diam:    2.60 cm RV S prime:     10.10 cm/s TAPSE (M-mode): 1.8 cm LEFT ATRIUM             Index        RIGHT ATRIUM           Index LA diam:        4.00 cm 1.97 cm/m   RA Area:  20.20 cm LA Vol (A2C):   98.8 ml 48.55 ml/m  RA Volume:   58.60 ml  28.80 ml/m LA Vol (A4C):   76.6 ml 37.64 ml/m LA Biplane Vol: 89.1 ml 43.78 ml/m  AORTIC VALVE AV Area (Vmax):    1.36 cm AV Area (Vmean):   1.48 cm AV Area (VTI):     1.36 cm AV Vmax:           242.00 cm/s AV Vmean:          142.500 cm/s AV VTI:            0.415 m AV Peak Grad:      23.4 mmHg AV Mean  Grad:      14.0 mmHg LVOT Vmax:         145.50 cm/s LVOT Vmean:        93.200 cm/s LVOT VTI:          0.249 m LVOT/AV VTI ratio: 0.60  AORTA Ao Root diam: 2.60 cm Ao Asc diam:  3.60 cm MITRAL VALVE MV Area (PHT): 5.54 cm     SHUNTS MV Decel Time: 137 msec     Systemic VTI:  0.25 m MV E velocity: 114.00 cm/s  Systemic Diam: 1.70 cm MV A velocity: 82.70 cm/s MV E/A ratio:  1.38 Franck Azobou Tonleu Electronically signed by Joelle Cedars Tonleu Signature Date/Time: 06/10/2024/4:08:51 PM    Final     Medications:   amLODipine   5 mg Oral Daily   apixaban   5 mg Oral BID   Chlorhexidine  Gluconate Cloth  6 each Topical Q0600   citalopram   40 mg Oral Daily   doxycycline   100 mg Oral Q12H   ferric citrate   630 mg Oral BID WC   fluticasone  furoate-vilanterol  1 puff Inhalation Daily   ipratropium  0.5 mg Nebulization BID   levETIRAcetam   500 mg Oral BID   methylPREDNISolone  (SOLU-MEDROL ) injection  60 mg Intravenous Daily   metoprolol  tartrate  50 mg Oral BID   nicotine   14 mg Transdermal Daily   pregabalin   25 mg Oral QHS   rosuvastatin   10 mg Oral Daily   sodium zirconium cyclosilicate   10 g Oral BID   traZODone   50 mg Oral QHS    Dialysis Orders: [facility] tts 4 hours EDW 82.5kg BFR 425 DFR 800 avf 15g heparin  3000 unit bolus.   Assessment/Plan: 1. Hyperkalemia: Recurrent despite lokelma  and HD. On Lokekma BID. Current K+ 5.0. 2. ESRD:  Typically TTS schedule. Received extra HD 12/14. On HD. 3. HTN/volume: Pulmonary edema on CXR. Further UF with HD today.  4. Anemia: Hgb 11. No ESA indicated at this time.  5. Secondary hyperparathyroidism: Calcium  controlled, follow phos.  6. Nutrition: Currently NPO 7. A.fib: Cardiology onboard, possible echo and cardioversion planned.   Charmaine Piety, NP [facility] 06/11/2024,9:23 AM  LOS: 3 days

## 2024-06-11 NOTE — TOC Transition Note (Signed)
 Transition of Care [facility]) - Discharge Note   Patient Details  Name: [name] [name]. MRN: [id number] Date of Birth: 11/09/1960  Transition of Care [facility]) CM/SW Contact:  Waddell Barnie Rama, RN Phone Number: 06/11/2024, 11:20 AM   Clinical Narrative:    For dc today, NCM notified Hedda, patient states his daughter will transport him home, he has the bsc and the oxygen  tank in the room.  NCM informed him to call [employer] when he gets home , provided the number for him, so they can come to set up the concentrator.      Barriers to Discharge: Continued Medical Work up   Patient Goals and CMS Choice Patient states their goals for this hospitalization and ongoing recovery are:: return home CMS Medicare.gov Compare Post Acute Care list provided to:: Patient Choice offered to / list presented to : Patient      Discharge Placement                       Discharge Plan and Services Additional resources added to the After Visit Summary for     Discharge Planning Services: CM Consult Post Acute Care Choice: Durable Medical Equipment, Home Health          DME Arranged: Oxygen , Bedside commode DME Agency: [employer] Date DME Agency Contacted: 06/10/24 Time DME Agency Contacted: 1527 Representative spoke with at DME Agency: [place] HH Arranged: PT HH Agency: [facility] Date [facility] Agency Contacted: 06/10/24 Time HH Agency Contacted: 1527 Representative spoke with at [facility] Agency: [place]  Social Drivers of Health (SDOH) Interventions SDOH Screenings   Food Insecurity: No Food Insecurity (06/09/2024)  Housing: Low Risk (06/09/2024)  Transportation Needs: No Transportation Needs (06/09/2024)  Utilities: Not At Risk (06/09/2024)  Alcohol  Screen: Low Risk (09/19/2023)  Depression (PHQ2-9): Low Risk (09/19/2023)  Financial Resource Strain: High Risk (09/19/2023)  Physical Activity: Patient Declined (09/19/2023)  Social Connections: Socially Isolated (06/09/2024)   Stress: No Stress Concern Present (09/19/2023)  Tobacco Use: High Risk (06/09/2024)  Health Literacy: Adequate Health Literacy (09/19/2023)     Readmission Risk Interventions     No data to display

## 2024-06-12 ENCOUNTER — Telehealth: Payer: Self-pay | Admitting: *Deleted

## 2024-06-12 NOTE — Transitions of Care (Post Inpatient/ED Visit) (Signed)
° °  06/12/2024  Name: [name] Reichart Sr. MRN: [id number] DOB: Sep 09, 1960  Today's TOC FU Call Status: Today's TOC FU Call Status:: Unsuccessful Call (1st Attempt) Unsuccessful Call (1st Attempt) Date: 06/12/24  Attempted to reach the patient regarding the most recent Inpatient/ED visit.  Follow Up Plan: Additional outreach attempts will be made to reach the patient to complete the Transitions of Care (Post Inpatient/ED visit) call.   Cathlean Headland BSN RN [place] [facility] Health Care Management Coordinator [email or web address] Direct Dial : [phone]  Fax: [phone] Website: [email or web address]

## 2024-06-13 ENCOUNTER — Telehealth: Payer: Self-pay

## 2024-06-13 NOTE — Transitions of Care (Post Inpatient/ED Visit) (Signed)
° °  06/13/2024  Name: [name] Schadt Sr. MRN: [id number] DOB: 1961-01-23  Today's TOC FU Call Status: Today's TOC FU Call Status:: Unsuccessful Call (3rd Attempt) Unsuccessful Call (3rd Attempt) Date: 06/13/24  Attempted to reach the patient regarding the most recent Inpatient/ED visit.  Follow Up Plan: No further outreach attempts will be made at this time. We have been unable to contact the patient.  Shona Prow RN, CCM [place]  VBCI-Population Health RN Care Manager [phone]

## 2024-06-13 NOTE — Transitions of Care (Post Inpatient/ED Visit) (Signed)
° °  06/13/2024  Name: [name] Ribas Sr. MRN: [id number] DOB: [birth year]  Today's TOC FU Call Status: Today's TOC FU Call Status:: Unsuccessful Call (2nd Attempt) Unsuccessful Call (2nd Attempt) Date: 06/13/24  Attempted to reach the patient regarding the most recent Inpatient/ED visit.  Follow Up Plan: Additional outreach attempts will be made to reach the patient to complete the Transitions of Care (Post Inpatient/ED visit) call.   Alan Ee, RN, BSN, CEN [employer]- Transition of Care Team.  [facility] [phone]

## 2024-06-25 ENCOUNTER — Telehealth: Admitting: Family Medicine

## 2024-06-25 ENCOUNTER — Ambulatory Visit: Payer: Self-pay | Admitting: Family Medicine

## 2024-06-25 ENCOUNTER — Other Ambulatory Visit: Payer: Self-pay

## 2024-06-25 DIAGNOSIS — F419 Anxiety disorder, unspecified: Secondary | ICD-10-CM

## 2024-06-25 DIAGNOSIS — E1169 Type 2 diabetes mellitus with other specified complication: Secondary | ICD-10-CM

## 2024-06-25 DIAGNOSIS — R569 Unspecified convulsions: Secondary | ICD-10-CM

## 2024-06-25 DIAGNOSIS — B370 Candidal stomatitis: Secondary | ICD-10-CM | POA: Diagnosis not present

## 2024-06-25 DIAGNOSIS — J438 Other emphysema: Secondary | ICD-10-CM

## 2024-06-25 DIAGNOSIS — I10 Essential (primary) hypertension: Secondary | ICD-10-CM

## 2024-06-25 DIAGNOSIS — E785 Hyperlipidemia, unspecified: Secondary | ICD-10-CM | POA: Diagnosis not present

## 2024-06-25 DIAGNOSIS — K7460 Unspecified cirrhosis of liver: Secondary | ICD-10-CM | POA: Diagnosis not present

## 2024-06-25 DIAGNOSIS — F32A Depression, unspecified: Secondary | ICD-10-CM

## 2024-06-25 DIAGNOSIS — I480 Paroxysmal atrial fibrillation: Secondary | ICD-10-CM

## 2024-06-25 DIAGNOSIS — G4700 Insomnia, unspecified: Secondary | ICD-10-CM

## 2024-06-25 MED ORDER — ALBUTEROL SULFATE HFA 108 (90 BASE) MCG/ACT IN AERS
2.0000 | INHALATION_SPRAY | Freq: Four times a day (QID) | RESPIRATORY_TRACT | 0 refills | Status: DC | PRN
  Filled 2024-06-25: qty 18, 25d supply, fill #0

## 2024-06-25 MED ORDER — METOPROLOL TARTRATE 50 MG PO TABS
50.0000 mg | ORAL_TABLET | Freq: Two times a day (BID) | ORAL | 0 refills | Status: DC
  Filled 2024-06-25: qty 60, 30d supply, fill #0

## 2024-06-25 MED ORDER — DICYCLOMINE HCL 10 MG PO CAPS
10.0000 mg | ORAL_CAPSULE | Freq: Three times a day (TID) | ORAL | 0 refills | Status: DC | PRN
  Filled 2024-06-25: qty 90, 30d supply, fill #0

## 2024-06-25 MED ORDER — TRAZODONE HCL 50 MG PO TABS
50.0000 mg | ORAL_TABLET | Freq: Every day | ORAL | 0 refills | Status: DC
  Filled 2024-06-25: qty 30, 30d supply, fill #0

## 2024-06-25 MED ORDER — CITALOPRAM HYDROBROMIDE 40 MG PO TABS
40.0000 mg | ORAL_TABLET | Freq: Every day | ORAL | 0 refills | Status: DC
  Filled 2024-06-25: qty 30, 30d supply, fill #0

## 2024-06-25 MED ORDER — APIXABAN 5 MG PO TABS
5.0000 mg | ORAL_TABLET | Freq: Two times a day (BID) | ORAL | 0 refills | Status: DC
  Filled 2024-06-25: qty 60, 30d supply, fill #0

## 2024-06-25 MED ORDER — LEVETIRACETAM 500 MG PO TABS
500.0000 mg | ORAL_TABLET | Freq: Two times a day (BID) | ORAL | 0 refills | Status: DC
  Filled 2024-06-25: qty 60, 30d supply, fill #0

## 2024-06-25 MED ORDER — AMLODIPINE BESYLATE 5 MG PO TABS
5.0000 mg | ORAL_TABLET | Freq: Every day | ORAL | 0 refills | Status: DC
  Filled 2024-06-25: qty 30, 30d supply, fill #0

## 2024-06-25 MED ORDER — NYSTATIN 100000 UNIT/ML MT SUSP
5.0000 mL | Freq: Four times a day (QID) | OROMUCOSAL | 0 refills | Status: AC
  Filled 2024-06-25: qty 200, 10d supply, fill #0

## 2024-06-25 MED ORDER — ROSUVASTATIN CALCIUM 10 MG PO TABS
10.0000 mg | ORAL_TABLET | Freq: Every day | ORAL | 0 refills | Status: AC
  Filled 2024-06-25 (×2): qty 30, 30d supply, fill #0

## 2024-06-25 MED ORDER — FLUTICASONE-SALMETEROL 100-50 MCG/ACT IN AEPB
1.0000 | INHALATION_SPRAY | Freq: Two times a day (BID) | RESPIRATORY_TRACT | 0 refills | Status: DC
  Filled 2024-06-25: qty 60, 30d supply, fill #0

## 2024-06-25 NOTE — Telephone Encounter (Signed)
 FYI Only or Action Required?: Action required by provider: medication refill request, update on patient condition, and appointment scheduled for 07/09/2024.  Patient was last seen in primary care on 12/18/2023 by Newlin, Enobong, MD.  Called Nurse Triage reporting thrush, appointment and refill assistance   Symptoms began several days ago.    Symptoms are: unchanged.  Triage Disposition: Information or Advice Only Call  Patient/caregiver understands and will follow disposition?: Yes  Copied from CRM #[id number]. Topic: Clinical - Red Word Triage >> Jun 25, 2024  8:42 AM Tobias CROME wrote: Red Word that prompted transfer to Nurse Triage: Having afib, on oxygen  now. Harder to breathe. Dialysis is getting harder, patient would like to discuss end of life.   Requesting appointment for Friday, 06/28/24 Reason for Disposition  Health information question, no triage required and triager able to answer question  Answer Assessment - Initial Assessment Questions 1. SYMPTOM: What's the main symptom you're concerned about? (e.g., chapped lips, dry mouth, lump, sores)     Mouth pain, white patches 4. CAUSE: What do you think is causing the symptoms?    Thush. Recent use of inhalers during hospitalization  Answer Assessment - Initial Assessment Questions Patient's daughter, Amy, called to report that her father is requesting an urgent hospital follow up appointment.  Daughter not on DPR, unable to report current symptoms, but states that patient would like to get refill on medications prescribed at discharge and would also like to discuss end of life planning.  Reports that patient's telephone number has changed.   Patient contacted through his mother listed on chart. Verbal permission to update information in chart with new phone number and daughter's email. He reports the following: Does not feel good, generally at all Takes time to get used to afib and chest constantly hurting On oxygen  all  night and most of the day.  Only wants to see Dr. Delbert- does not want to run out of meds or oxygen  Also has Thrush  Contacted Daughter, Amy to receive updated contact information Contacted CAL Daniella, received appt for 07/09/24 at 850 and placed on wait list [facility] visit scheduled for 06/25/24 for thrush symptoms and possible one time refill Informed daughter of mobile bus as resource Refill requests sent to office for medications prescribed at discharge  All in agreement to proceed to ED if symptoms worsen  Protocols used: Mouth Symptoms-A-AH, Information Only Call - No Triage-A-AH

## 2024-06-25 NOTE — Patient Instructions (Signed)

## 2024-06-25 NOTE — Progress Notes (Signed)
 " Virtual Visit Consent   [name] Whedbee Sr., you are scheduled for a virtual visit with a [facility] provider today. Just as with appointments in the office, your consent must be obtained to participate. Your consent will be active for this visit and any virtual visit you may have with one of our providers in the next 365 days. If you have a MyChart account, a copy of this consent can be sent to you electronically.  As this is a virtual visit, video technology does not allow for your provider to perform a traditional examination. This may limit your provider's ability to fully assess your condition. If your provider identifies any concerns that need to be evaluated in person or the need to arrange testing (such as labs, EKG, etc.), we will make arrangements to do so. Although advances in technology are sophisticated, we cannot ensure that it will always work on either your end or our end. If the connection with a video visit is poor, the visit may have to be switched to a telephone visit. With either a video or telephone visit, we are not always able to ensure that we have a secure connection.  By engaging in this virtual visit, you consent to the provision of healthcare and authorize for your insurance to be billed (if applicable) for the services provided during this visit. Depending on your insurance coverage, you may receive a charge related to this service.  I need to obtain your verbal consent now. Are you willing to proceed with your visit today? [name] Reichel Sr. has provided verbal consent on 06/25/2024 for a virtual visit (video or telephone). [name] Lamp, FNP  Date: 06/25/2024 6:00 PM   Virtual Visit via Video Note   I, [name] [name], connected with  [name] Vahle Sr.  ([id number], 25-May-1961) on 06/25/2024 at  5:45 PM EST by a video-enabled telemedicine application and verified that I am speaking with the correct person using two identifiers.  Location: Patient: Virtual Visit Location  Patient: Home Provider: Virtual Visit Location Provider: Home Office   I discussed the limitations of evaluation and management by telemedicine and the availability of in person appointments. The patient expressed understanding and agreed to proceed.    History of Present Illness: [name] Ewart Sr. is a 63 y.o. who identifies as a male who was assigned male at birth, and is being seen today for thrush, he has been on antibiotic while in hospital. He also needs 30 days of meds until he can see pcp. He has a white coating on tongue. SABRA  HPI: HPI  Problems:  Patient Active Problem List   Diagnosis Date Noted   PAF (paroxysmal atrial fibrillation) (HCC) 06/09/2024   Acute hypoxemic respiratory failure (HCC) 06/08/2024   Hepatic cirrhosis, unspecified hepatic cirrhosis type, unspecified whether ascites present (HCC) 08/21/2023   Acute metabolic encephalopathy 05/09/2023   CKD (chronic kidney disease) stage 5, GFR less than 15 ml/min (HCC) 08/04/2022   ESRD (end stage renal disease) (HCC) 08/04/2022   Shakes 08/04/2022   Disorder of phosphorus metabolism, unspecified 07/28/2021   Bradycardia with 41-50 beats per minute 05/05/2021   Allergy, unspecified, initial encounter 03/26/2021   Anaphylactic shock, unspecified, initial encounter 03/26/2021   Anemia in chronic kidney disease 03/26/2021   Complication of vascular dialysis catheter 03/26/2021   Diarrhea, unspecified 03/26/2021   Fever, unspecified 03/26/2021   Iron  deficiency anemia, unspecified 03/26/2021   Other specified coagulation defects 03/26/2021   Pruritus, unspecified 03/26/2021   Secondary hyperparathyroidism of renal origin  03/26/2021   Type 2 diabetes mellitus with diabetic peripheral angiopathy without gangrene (HCC) 03/26/2021   Atherosclerotic heart disease of native coronary artery without angina pectoris 03/26/2021   Shortness of breath 03/26/2021   Type 2 diabetes mellitus with other diabetic kidney complication (HCC)  03/26/2021   Arthritis 03/10/2021   CHF (congestive heart failure) (HCC) 03/10/2021   Cough 03/10/2021   Heart murmur 03/10/2021   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   Edema 07/20/2018   Acute renal failure superimposed on stage 2 chronic kidney disease 06/12/2018   Hyperkalemia 06/12/2018   Bilateral lower extremity edema 06/11/2018   Seizure (HCC) 10/18/2017   Alcohol  abuse 10/18/2017   Neuropathy 10/18/2017   Coronary artery calcification seen on CT scan 10/09/2017   Anxiety and depression 10/06/2017   Dyspnea 10/06/2017   CAD (coronary artery disease) 10/06/2017   COPD (chronic obstructive pulmonary disease) (HCC) 09/29/2017   Tobacco abuse 09/29/2017   Hypertension 09/29/2017   Myelopathy (HCC) 09/14/2017   Type 2 diabetes mellitus (HCC) 10/17/2011   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    Allergies: Allergies[1] Medications: Current Medications[2]  Observations/Objective: Patient is well-developed, well-nourished in no acute distress.  Resting comfortably  at home.  Head is normocephalic, atraumatic.  No labored breathing.  Speech is clear and coherent with logical content.  Patient is alert and oriented at baseline.    Assessment and Plan: 1. Thrush (Primary)  2. Anxiety and depression - citalopram  (CELEXA ) 40 MG tablet; Take 1 tablet (40 mg total) by mouth daily.Must have office visit for refills  Dispense: 30 tablet; Refill: 0  3. Primary hypertension  4. Other emphysema (HCC)  5. PAF (paroxysmal atrial fibrillation) (HCC)  6. Insomnia, unspecified type  7. Seizure (HCC)  8. Hepatic cirrhosis, unspecified hepatic cirrhosis type, unspecified whether ascites present (HCC)  9. Hyperlipidemia associated with type 2 diabetes mellitus (HCC)  Follow up with pcp or with urgent care as needed.   Follow Up Instructions: I discussed the assessment and treatment plan with the patient. The patient was provided  an opportunity to ask questions and all were answered. The patient agreed with the plan and demonstrated an understanding of the instructions.  A copy of instructions were sent to the patient via MyChart unless otherwise noted below.     The patient was advised to call back or seek an in-person evaluation if the symptoms worsen or if the condition fails to improve as anticipated.    [name] Polinski, FNP     [1] No Known Allergies [2]  Current Outpatient Medications:    nystatin (MYCOSTATIN) 100000 UNIT/ML suspension, Take 5 mLs (500,000 Units total) by mouth 4 (four) times daily for 10 days. Swish and swallow, Disp: 200 mL, Rfl: 0   Accu-Chek Softclix Lancets lancets, Use as instructed tid before meals. Dx E11.22, Disp: 100 each, Rfl: 2   albuterol  (VENTOLIN  HFA) 108 (90 Base) MCG/ACT inhaler, Inhale 2 puffs into the lungs every 6 (six) hours as needed for COPD., Disp: 18 g, Rfl: 0   amLODipine  (NORVASC ) 5 MG tablet, Take 1 tablet (5 mg total) by mouth daily., Disp: 30 tablet, Rfl: 0   apixaban  (ELIQUIS ) 5 MG TABS tablet, Take 1 tablet (5 mg total) by mouth 2 (two) times daily., Disp: 60 tablet, Rfl: 0   AURYXIA  1 GM 210 MG(Fe) tablet, Take 630 mg by mouth 2 (two) times daily. and one with snacks, Disp: , Rfl:    Blood Glucose Monitoring Suppl (ACCU-CHEK GUIDE)  w/Device KIT, Use as directed 3 times daily., Disp: 1 kit, Rfl: 0   citalopram  (CELEXA ) 40 MG tablet, Take 1 tablet (40 mg total) by mouth daily.Must have office visit for refills, Disp: 30 tablet, Rfl: 0   dicyclomine  (BENTYL ) 10 MG capsule, Take 1 capsule (10 mg total) by mouth 3 (three) times daily as needed for spasms (Diarrhea)., Disp: 90 capsule, Rfl: 0   fluticasone -salmeterol (ADVAIR ) 100-50 MCG/ACT AEPB, Inhale 1 puff into the lungs 2 (two) times daily., Disp: 60 each, Rfl: 0   glucose blood (ACCU-CHEK GUIDE TEST) test strip, Use as instructed 3 times daily. Dx E11.22, Disp: 100 each, Rfl: 12   glucose blood (ACCU-CHEK GUIDE) test  strip, Use as instructed 3 times daily. Dx E11.22, Disp: 100 each, Rfl: 12   levETIRAcetam  (KEPPRA ) 500 MG tablet, Take 1 tablet (500 mg total) by mouth 2 (two) times daily., Disp: 60 tablet, Rfl: 0   metoprolol  tartrate (LOPRESSOR ) 50 MG tablet, Take 1 tablet (50 mg total) by mouth 2 (two) times daily., Disp: 60 tablet, Rfl: 0   pregabalin  (LYRICA ) 25 MG capsule, Take 1 capsule (25 mg total) by mouth at bedtime., Disp: 90 capsule, Rfl: 0   rosuvastatin  (CRESTOR ) 10 MG tablet, Take 1 tablet (10 mg total) by mouth daily., Disp: 30 tablet, Rfl: 0   sodium zirconium cyclosilicate  (LOKELMA ) 10 g PACK packet, Take 10 g by mouth 2 (two) times daily., Disp: , Rfl:    traZODone  (DESYREL ) 50 MG tablet, Take 1 tablet (50 mg total) by mouth at bedtime., Disp: 30 tablet, Rfl: 0  "

## 2024-06-26 ENCOUNTER — Other Ambulatory Visit: Payer: Self-pay

## 2024-07-08 ENCOUNTER — Telehealth: Payer: Self-pay | Admitting: Family Medicine

## 2024-07-08 NOTE — Telephone Encounter (Signed)
 Pt confirmed appt

## 2024-07-09 ENCOUNTER — Other Ambulatory Visit: Payer: Self-pay

## 2024-07-09 ENCOUNTER — Telehealth: Payer: Self-pay

## 2024-07-09 ENCOUNTER — Encounter: Payer: Self-pay | Admitting: Family Medicine

## 2024-07-09 ENCOUNTER — Ambulatory Visit: Payer: Self-pay | Attending: Family Medicine | Admitting: Family Medicine

## 2024-07-09 ENCOUNTER — Other Ambulatory Visit: Payer: Self-pay | Admitting: Family Medicine

## 2024-07-09 VITALS — BP 159/79 | HR 60 | Temp 97.9°F | Ht 74.0 in | Wt 179.0 lb

## 2024-07-09 DIAGNOSIS — Z8249 Family history of ischemic heart disease and other diseases of the circulatory system: Secondary | ICD-10-CM | POA: Diagnosis not present

## 2024-07-09 DIAGNOSIS — R195 Other fecal abnormalities: Secondary | ICD-10-CM | POA: Insufficient documentation

## 2024-07-09 DIAGNOSIS — F32A Depression, unspecified: Secondary | ICD-10-CM | POA: Diagnosis not present

## 2024-07-09 DIAGNOSIS — R569 Unspecified convulsions: Secondary | ICD-10-CM | POA: Insufficient documentation

## 2024-07-09 DIAGNOSIS — I509 Heart failure, unspecified: Secondary | ICD-10-CM | POA: Diagnosis not present

## 2024-07-09 DIAGNOSIS — F419 Anxiety disorder, unspecified: Secondary | ICD-10-CM | POA: Diagnosis not present

## 2024-07-09 DIAGNOSIS — M62838 Other muscle spasm: Secondary | ICD-10-CM

## 2024-07-09 DIAGNOSIS — N185 Chronic kidney disease, stage 5: Secondary | ICD-10-CM | POA: Diagnosis not present

## 2024-07-09 DIAGNOSIS — I120 Hypertensive chronic kidney disease with stage 5 chronic kidney disease or end stage renal disease: Secondary | ICD-10-CM | POA: Diagnosis not present

## 2024-07-09 DIAGNOSIS — R42 Dizziness and giddiness: Secondary | ICD-10-CM | POA: Diagnosis not present

## 2024-07-09 DIAGNOSIS — J9610 Chronic respiratory failure, unspecified whether with hypoxia or hypercapnia: Secondary | ICD-10-CM | POA: Insufficient documentation

## 2024-07-09 DIAGNOSIS — Z59868 Other specified financial insecurity: Secondary | ICD-10-CM | POA: Diagnosis not present

## 2024-07-09 DIAGNOSIS — I480 Paroxysmal atrial fibrillation: Secondary | ICD-10-CM

## 2024-07-09 DIAGNOSIS — F418 Other specified anxiety disorders: Secondary | ICD-10-CM

## 2024-07-09 DIAGNOSIS — Z9981 Dependence on supplemental oxygen: Secondary | ICD-10-CM | POA: Diagnosis not present

## 2024-07-09 DIAGNOSIS — F0390 Unspecified dementia without behavioral disturbance: Secondary | ICD-10-CM | POA: Diagnosis not present

## 2024-07-09 DIAGNOSIS — R911 Solitary pulmonary nodule: Secondary | ICD-10-CM | POA: Insufficient documentation

## 2024-07-09 DIAGNOSIS — Z992 Dependence on renal dialysis: Secondary | ICD-10-CM | POA: Diagnosis not present

## 2024-07-09 DIAGNOSIS — Z833 Family history of diabetes mellitus: Secondary | ICD-10-CM | POA: Diagnosis not present

## 2024-07-09 DIAGNOSIS — J9611 Chronic respiratory failure with hypoxia: Secondary | ICD-10-CM

## 2024-07-09 DIAGNOSIS — F17210 Nicotine dependence, cigarettes, uncomplicated: Secondary | ICD-10-CM | POA: Diagnosis not present

## 2024-07-09 DIAGNOSIS — E1122 Type 2 diabetes mellitus with diabetic chronic kidney disease: Secondary | ICD-10-CM | POA: Insufficient documentation

## 2024-07-09 DIAGNOSIS — I132 Hypertensive heart and chronic kidney disease with heart failure and with stage 5 chronic kidney disease, or end stage renal disease: Secondary | ICD-10-CM | POA: Insufficient documentation

## 2024-07-09 DIAGNOSIS — J449 Chronic obstructive pulmonary disease, unspecified: Secondary | ICD-10-CM | POA: Diagnosis not present

## 2024-07-09 DIAGNOSIS — J438 Other emphysema: Secondary | ICD-10-CM

## 2024-07-09 DIAGNOSIS — Z1211 Encounter for screening for malignant neoplasm of colon: Secondary | ICD-10-CM

## 2024-07-09 MED ORDER — TRAZODONE HCL 50 MG PO TABS
50.0000 mg | ORAL_TABLET | Freq: Every day | ORAL | 1 refills | Status: AC
  Filled 2024-07-09: qty 90, 90d supply, fill #0

## 2024-07-09 MED ORDER — LEVETIRACETAM 500 MG PO TABS
500.0000 mg | ORAL_TABLET | Freq: Two times a day (BID) | ORAL | 1 refills | Status: AC
  Filled 2024-07-09: qty 180, 90d supply, fill #0

## 2024-07-09 MED ORDER — METOPROLOL TARTRATE 50 MG PO TABS
50.0000 mg | ORAL_TABLET | Freq: Two times a day (BID) | ORAL | 1 refills | Status: AC
  Filled 2024-07-09 (×2): qty 180, 90d supply, fill #0

## 2024-07-09 MED ORDER — MISC. DEVICES MISC: 0 refills | Status: DC

## 2024-07-09 MED ORDER — LIDOCAINE 5 % EX PTCH
1.0000 | MEDICATED_PATCH | CUTANEOUS | 1 refills | Status: AC
  Filled 2024-07-09: qty 30, 30d supply, fill #0

## 2024-07-09 MED ORDER — PREGABALIN 25 MG PO CAPS
25.0000 mg | ORAL_CAPSULE | Freq: Every day | ORAL | 1 refills | Status: AC
  Filled 2024-07-09: qty 90, 90d supply, fill #0

## 2024-07-09 MED ORDER — AMLODIPINE BESYLATE 5 MG PO TABS
5.0000 mg | ORAL_TABLET | Freq: Every day | ORAL | 1 refills | Status: AC
  Filled 2024-07-09 – 2024-07-26 (×2): qty 90, 90d supply, fill #0

## 2024-07-09 MED ORDER — CITALOPRAM HYDROBROMIDE 40 MG PO TABS
40.0000 mg | ORAL_TABLET | Freq: Every day | ORAL | 1 refills | Status: AC
  Filled 2024-07-09 – 2024-07-26 (×2): qty 90, 90d supply, fill #0

## 2024-07-09 MED ORDER — ALBUTEROL SULFATE HFA 108 (90 BASE) MCG/ACT IN AERS
2.0000 | INHALATION_SPRAY | Freq: Four times a day (QID) | RESPIRATORY_TRACT | 6 refills | Status: AC | PRN
  Filled 2024-07-09: qty 18, 25d supply, fill #0
  Filled 2024-07-31: qty 18, 25d supply, fill #1

## 2024-07-09 MED ORDER — MISC. DEVICES MISC: 0 refills | Status: AC

## 2024-07-09 MED ORDER — TRELEGY ELLIPTA 100-62.5-25 MCG/ACT IN AEPB
1.0000 | INHALATION_SPRAY | Freq: Every day | RESPIRATORY_TRACT | 11 refills | Status: AC
  Filled 2024-07-09: qty 60, 30d supply, fill #0
  Filled 2024-07-31: qty 60, 30d supply, fill #1

## 2024-07-09 MED ORDER — MECLIZINE HCL 25 MG PO TABS
25.0000 mg | ORAL_TABLET | Freq: Three times a day (TID) | ORAL | 0 refills | Status: DC | PRN
  Filled 2024-07-09: qty 30, 10d supply, fill #0

## 2024-07-09 MED ORDER — DICYCLOMINE HCL 10 MG PO CAPS
10.0000 mg | ORAL_CAPSULE | Freq: Three times a day (TID) | ORAL | 3 refills | Status: AC | PRN
  Filled 2024-07-09 – 2024-07-26 (×2): qty 90, 30d supply, fill #0

## 2024-07-09 MED ORDER — APIXABAN 5 MG PO TABS
5.0000 mg | ORAL_TABLET | Freq: Two times a day (BID) | ORAL | 1 refills | Status: AC
  Filled 2024-07-09: qty 180, 90d supply, fill #0

## 2024-07-09 NOTE — Telephone Encounter (Signed)
 I met with the patient and his daughter, Amy, when they were in the clinic today.  He currently receives his O2 from [facility] and they would like to change O2 providers.  Amy explained that it is extremely difficult to get in touch with [facility].    She said her father goes through tanks very quickly. He uses from 2-6 or more liters continuously throughout the day depending on what he is doing.  He is out of the house a lot and he also goes to dialysis. She said he does use the the dialysis O2 when he is receiving his treatments.  She said she has called [facility] a number of times and they tell her they don't have tanks available.  She is requesting his O2 provider be switched to [facility].   I spoke to Thomasina [place], [facility], to inquire about switching O2 providers. He said that in addition to a new order, they would need a walk test as the walk test done in the hospital is only good for 2 days.  He said that they can pull the order from Epic and they will plan to deliver the O2 equipment today.  He stated that [facility] would not remove their equipment until the new O2 is delivered from [facility].   He also noted that the patient's daughter can call them when she needs new O2 tanks or she can bring the empty tanks to their warehouse herself and exchange them. I shared this information from [facility] with the patient and Amy.   I spoke to [facility] again and he said that if Dr Delbert could place an order for O2 @ 6 L continuously, they can deliver a 10 L concentrator.   He also said that he has notified his Intake Team to check for the order and walk test results so they can process the order today.

## 2024-07-09 NOTE — Telephone Encounter (Signed)
 Call received from [facility] / [facility] stating he has not been able to see the order for O2 in Epic, I told him that I would email him the signed order and the order was then emailed as discussed to [email or web address]

## 2024-07-09 NOTE — Progress Notes (Signed)
 "  Subjective:  Patient ID: [name] Crisp Sr., male    DOB: 1960/07/21  Age: 64 y.o. MRN: [id number]  CC: Hospitalization Follow-up (Medication refills/Dizzy and lightheaded/Discuss changing Oxygen  companies/)     Discussed the use of AI scribe software for clinical note transcription with the patient, who gave verbal consent to proceed.  History of Present Illness [name] Wenke Sr. is a 64 year old male with COPD and chronic kidney disease who presents with dizziness and respiratory issues. He is accompanied by his daughter, Amy.  He was hospitalized last month for acute respiratory failure from COPD and pulmonary edema after missing dialysis.  He had missed dialysis due to having diarrhea for 4 to 5 days.  He feels improved on home oxygen  at 2-3 L/min during the day and 5-6 L/min at night but says two oxygen  tanks per week from insurance are not enough. He uses Advair  with only partial relief and has persistent nonproductive cough and wheezing.  He uses a nebulizer for breathing treatments and has run out of albuterol  solution.  Room air resting pulse oximetry 83 % Pulse oximetry on 3 LPM oxygen  via nasal cannula at rest was 95% Tested by [employer], CMA  He has dizziness, mainly when standing quickly or turning his head, described as feeling about to fall without room spinning. During his hospitalization his hemoglobin fell to 8.8 and later improved to 11.0. He notes very dark, black stools since starting dialysis.  He had polyps removed on colonoscopy in 2021 after a positive stool test. He reports ongoing black stools and is due for colon cancer screening as he was placed on a 3-year recall. He also has dementia that affects his memory.  He has left-sided chest wall pain triggered by movement and not by palpation. He does not take pain medication and is on gabapentin  for neuropathy.  He is scheduled for dialysis today and usually takes his blood pressure medication before  dialysis.    Past Medical History:  Diagnosis Date   Acute renal failure superimposed on stage 2 chronic kidney disease 06/12/2018   Alcohol  abuse 10/18/2017   Allergy, unspecified, initial encounter 03/26/2021   Anaphylactic shock, unspecified, initial encounter 03/26/2021   Anemia in chronic kidney disease 03/26/2021   Anxiety and depression 10/06/2017   Arthritis    Atherosclerotic heart disease of native coronary artery without angina pectoris 03/26/2021   Bilateral lower extremity edema 06/11/2018   Bradycardia with 41-50 beats per minute 05/05/2021   CAD (coronary artery disease) 10/06/2017   CHF (congestive heart failure) (HCC)    CKD (chronic kidney disease) 07/20/2018   CKD (chronic kidney disease) stage 5, GFR less than 15 ml/min (HCC)    dialysis T/Th/Sa   Complication of vascular dialysis catheter 03/26/2021   COPD (chronic obstructive pulmonary disease) (HCC) 09/29/2017   Coronary artery calcification seen on CT scan 10/09/2017   Cough    Diarrhea, unspecified 03/26/2021   Disorder of phosphorus metabolism, unspecified 07/28/2021   Dyspnea    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   Edema 07/20/2018   ESRD (end stage renal disease) (HCC)    TTHSAT  Geron[facility] Car   Fever, unspecified 03/26/2021   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 Tremors whebn he moves it.   Shortness of breath 03/26/2021   Tobacco abuse 09/29/2017   Type 2 diabetes mellitus (HCC) 10/17/2011   diet controlled - per pt does not  check CBG at home- insurance did not approve - 08/13/21   Type 2 diabetes mellitus with diabetic peripheral angiopathy without gangrene (HCC) 03/26/2021   Type 2 diabetes mellitus with other diabetic kidney complication (HCC) 03/26/2021    Past Surgical History:  Procedure Laterality Date   A/V FISTULAGRAM Right 12/06/2021   Procedure: A/V Fistulagram;  Surgeon: Sheree Penne Bruckner, MD;  Location: [facility] INVASIVE CV LAB;  Service: Cardiovascular;  Laterality: Right;   A/V FISTULAGRAM Right 05/17/2024   Procedure: A/V Fistulagram;  Surgeon: Gretta Bruckner PARAS, MD;  Location: [facility] PV LAB;  Service: Cardiovascular;  Laterality: Right;   ANTERIOR CERVICAL DECOMP/DISCECTOMY FUSION N/A 09/14/2017   Procedure: ANTERIOR CERVICAL DECOMPRESSION/DISCECTOMY FUSIONCERVICAL 4- CERVICAL 5, CERVICAL 5- CERVICAL 6;  Surgeon: Onetha Kuba, MD;  Location[facility];  Service: Neurosurgery;  Laterality: N/A;  ANTERIOR CERVICAL DECOMPRESSION/DISCECTOMY FUSIONCERVICAL 4- CERVICAL 5, CERVICAL 5- CERVICAL 6   AV FISTULA PLACEMENT Left 03/05/2021   Procedure: LEFT UPPER EXTREMITY RADIOBASILIC  ARTERIOVENOUS (AV) FISTULA CREATION;  Surgeon: Magda Debby SAILOR, MD;  Location: [facility] OR;  Service: Vascular;  Laterality: Left;  PERIPHERAL NERVE BLOCK   AV FISTULA PLACEMENT Left 04/21/2021   Procedure: INSERTION OF  LEFT ARM ARTERIOVENOUS (AV) GORE-TEX GRAFT;  Surgeon: Magda Debby SAILOR, MD;  Location: [facility] OR;  Service: Vascular;  Laterality: Left;  PERIPHERAL NERVE BLOCK   AV FISTULA PLACEMENT Right 06/30/2021   Procedure: RIGHT ARM ARTERIOVENOUS (AV) FISTULA;  Surgeon: Magda Debby SAILOR, MD;  Location: [facility] OR;  Service: Vascular;  Laterality: Right;  PERIPHERAL NERVE BLOCK   BASCILIC VEIN TRANSPOSITION Right 08/16/2021   Procedure: RIGHT ARM SECOND STAGE BASILIC VEIN TRANSPOSITION;  Surgeon: Magda Debby SAILOR, MD;  Location: [facility] OR;  Service: Vascular;  Laterality: Right;  PERIPHERAL NERVE BLOCK, LOCAL GIVEN   IR FLUORO GUIDE CV LINE LEFT   03/25/2021   PERIPHERAL VASCULAR BALLOON ANGIOPLASTY Right 12/06/2021   Procedure: PERIPHERAL VASCULAR BALLOON ANGIOPLASTY;  Surgeon: Sheree Penne Bruckner, MD;  Location: [facility] INVASIVE CV LAB;  Service: Cardiovascular;  Laterality: Right;   UPPER EXTREMITY VENOGRAPHY Bilateral 06/04/2021   Procedure: CENTRAL  AND UPPER VENOGRAPHY;  Surgeon: Magda Debby SAILOR, MD;  Location: [facility] INVASIVE CV LAB;  Service: Cardiovascular;  Laterality: Bilateral;   VENOUS ANGIOPLASTY  05/17/2024   Procedure: VENOUS ANGIOPLASTY;  Surgeon: Gretta Bruckner PARAS, MD;  Location: [facility] PV LAB;  Service: Cardiovascular;;    Family History  Problem Relation Age of Onset   Diabetes Mother    Scoliosis Mother    Kidney cancer Father    Lung cancer Father    Hypertension Brother    Colon cancer Neg Hx    Pancreatic cancer Neg Hx    Prostate cancer Neg Hx    Rectal cancer Neg Hx    Stomach cancer Neg Hx     Social History   Socioeconomic History   Marital status: Single    Spouse name: Not on file   Number of children: Not on file   Years of education: Not on file   Highest education level: Not on file  Occupational History   Not on file  Tobacco Use  Smoking status: Every Day    Current packs/day: 1.50    Average packs/day: 1.5 packs/day for 42.0 years (63.0 ttl pk-yrs)    Types: Cigarettes   Smokeless tobacco: Never   Tobacco comments:    1 pack a day   Vaping Use   Vaping status: Never Used  Substance and Sexual Activity   Alcohol  use: Not Currently    Alcohol /week: 10.0 standard drinks of alcohol     Types: 10 Cans of beer per week    Comment: quit September 2022   Drug use: Yes    Types: Marijuana    Comment: ocassionally   Sexual activity: Not on file  Other Topics Concern   Not on file  Social History Narrative   Not on file   Social Drivers of Health   Tobacco Use: High Risk (07/09/2024)   Patient History    Smoking Tobacco Use: Every Day    Smokeless Tobacco Use: Never    Passive  Exposure: Not on file  Financial Resource Strain: High Risk (09/19/2023)   Overall Financial Resource Strain (CARDIA)    Difficulty of Paying Living Expenses: Hard  Food Insecurity: No Food Insecurity (06/09/2024)   Epic    Worried About Programme Researcher, Broadcasting/film/video in the Last Year: Never true    Ran Out of Food in the Last Year: Never true  Transportation Needs: No Transportation Needs (06/09/2024)   Epic    Lack of Transportation (Medical): No    Lack of Transportation (Non-Medical): No  Physical Activity: Patient Declined (09/19/2023)   Exercise Vital Sign    Days of Exercise per Week: Patient declined    Minutes of Exercise per Session: Patient declined  Stress: No Stress Concern Present (09/19/2023)   [employer] of Occupational Health - Occupational Stress Questionnaire    Feeling of Stress : Not at all  Social Connections: Socially Isolated (06/09/2024)   Social Connection and Isolation Panel    Frequency of Communication with Friends and Family: Twice a week    Frequency of Social Gatherings with Friends and Family: Twice a week    Attends Religious Services: Never    Database Administrator or Organizations: No    Attends Banker Meetings: Never    Marital Status: Divorced  Depression (PHQ2-9): High Risk (07/09/2024)   Depression (PHQ2-9)    PHQ-2 Score: 18  Alcohol  Screen: Low Risk (09/19/2023)   Alcohol  Screen    Last Alcohol  Screening Score (AUDIT): 1  Housing: Low Risk (06/09/2024)   Epic    Unable to Pay for Housing in the Last Year: No    Number of Times Moved in the Last Year: 0    Homeless in the Last Year: No  Utilities: Not At Risk (06/09/2024)   Epic    Threatened with loss of utilities: No  Health Literacy: Adequate Health Literacy (09/19/2023)   B1300 Health Literacy    Frequency of need for help with medical instructions: Never    Allergies[1]  Outpatient Medications Prior to Visit  Medication Sig Dispense Refill   Accu-Chek Softclix  Lancets lancets Use as instructed tid before meals. Dx E11.22 100 each 2   AURYXIA  1 GM 210 MG(Fe) tablet Take 630 mg by mouth 2 (two) times daily. and one with snacks     Blood Glucose Monitoring Suppl (ACCU-CHEK GUIDE) w/Device KIT Use as directed 3 times daily. 1 kit 0   glucose blood (ACCU-CHEK GUIDE TEST) test strip Use as instructed 3 times daily. Dx  E11.22 100 each 12   glucose blood (ACCU-CHEK GUIDE) test strip Use as instructed 3 times daily. Dx E11.22 100 each 12   rosuvastatin  (CRESTOR ) 10 MG tablet Take 1 tablet (10 mg total) by mouth daily. 30 tablet 0   sodium zirconium cyclosilicate  (LOKELMA ) 10 g PACK packet Take 10 g by mouth 2 (two) times daily.     albuterol  (VENTOLIN  HFA) 108 (90 Base) MCG/ACT inhaler Inhale 2 puffs into the lungs every 6 (six) hours as needed for COPD. 18 g 0   amLODipine  (NORVASC ) 5 MG tablet Take 1 tablet (5 mg total) by mouth daily. 30 tablet 0   apixaban  (ELIQUIS ) 5 MG TABS tablet Take 1 tablet (5 mg total) by mouth 2 (two) times daily. 60 tablet 0   citalopram  (CELEXA ) 40 MG tablet Take 1 tablet (40 mg total) by mouth daily.Must have office visit for refills 30 tablet 0   dicyclomine  (BENTYL ) 10 MG capsule Take 1 capsule (10 mg total) by mouth 3 (three) times daily as needed for spasms (Diarrhea). 90 capsule 0   fluticasone -salmeterol (ADVAIR ) 100-50 MCG/ACT AEPB Inhale 1 puff into the lungs 2 (two) times daily. 60 each 0   levETIRAcetam  (KEPPRA ) 500 MG tablet Take 1 tablet (500 mg total) by mouth 2 (two) times daily. 60 tablet 0   metoprolol  tartrate (LOPRESSOR ) 50 MG tablet Take 1 tablet (50 mg total) by mouth 2 (two) times daily. 60 tablet 0   pregabalin  (LYRICA ) 25 MG capsule Take 1 capsule (25 mg total) by mouth at bedtime. 90 capsule 0   traZODone  (DESYREL ) 50 MG tablet Take 1 tablet (50 mg total) by mouth at bedtime. 30 tablet 0   No facility-administered medications prior to visit.     ROS Review of Systems  Constitutional:  Negative for  activity change and appetite change.  HENT:  Negative for sinus pressure and sore throat.   Respiratory:  Positive for cough and wheezing. Negative for chest tightness and shortness of breath.   Cardiovascular:  Negative for chest pain and palpitations.  Gastrointestinal:  Negative for abdominal distention, abdominal pain and constipation.  Genitourinary: Negative.   Musculoskeletal: Negative.   Neurological:  Positive for dizziness and light-headedness.  Psychiatric/Behavioral:  Negative for behavioral problems and dysphoric mood.     Objective:  BP (!) 159/79   Pulse 60   Temp 97.9 F (36.6 C) (Oral)   Ht 6' 2 (1.88 m)   Wt 179 lb (81.2 kg)   SpO2 96%   BMI 22.98 kg/m      07/09/2024    9:03 AM 06/11/2024   11:43 AM 06/11/2024   11:40 AM  BP/Weight  Systolic BP 159 152 152  Diastolic BP 79 75 76  Wt. (Lbs) 179 174.38   BMI 22.98 kg/m2 22.39 kg/m2     Orthostatic Vitals for the past 48 hrs (Last 6 readings):  Patient Position Orthostatic BP Orthostatic Pulse BP Pulse BP Location Cuff Size  07/09/24 0903 -- -- -- (!) 159/79 60 -- --  07/09/24 0942 Supine 163/73 55 -- -- Left Arm Small  07/09/24 0943 Sitting 164/84 56 -- -- Left Arm Small  07/09/24 0944 Standing 161/72 56 -- -- Left Arm Small     Physical Exam Constitutional:      Appearance: He is well-developed.  HENT:     Nose:     Comments: On 2 L of oxygen  via nasal cannula Cardiovascular:     Rate and Rhythm: Normal rate.  Heart sounds: Normal heart sounds. No murmur heard. Pulmonary:     Effort: Pulmonary effort is normal.     Breath sounds: Normal breath sounds. No wheezing or rales.  Chest:     Chest wall: No tenderness.  Abdominal:     General: Bowel sounds are normal. There is no distension.     Palpations: Abdomen is soft. There is no mass.     Tenderness: There is no abdominal tenderness.  Musculoskeletal:        General: Normal range of motion.     Right lower leg: No edema.     Left  lower leg: No edema.     Comments: No tenderness on palpation of thorax.  No tenderness on range of motion of thorax  Neurological:     Mental Status: He is alert and oriented to person, place, and time.  Psychiatric:        Mood and Affect: Mood normal.        Latest Ref Rng & Units 06/11/2024    5:25 AM 06/10/2024    3:39 AM 06/09/2024    3:05 AM  CMP  Glucose 70 - 99 mg/dL 861  853  768   BUN 8 - 23 mg/dL 52  25  32   Creatinine 0.61 - 1.24 mg/dL 1.99  4.26  2.13   Sodium 135 - 145 mmol/L 131  133  128   Potassium 3.5 - 5.1 mmol/L 5.0  5.3  6.8   Chloride 98 - 111 mmol/L 91  94  89   CO2 22 - 32 mmol/L 28  26  26    Calcium  8.9 - 10.3 mg/dL 8.8  9.0  8.7   Total Protein 6.5 - 8.1 g/dL 6.4  6.2    Total Bilirubin 0.0 - 1.2 mg/dL 0.9  0.6    Alkaline Phos 38 - 126 U/L 215  232    AST 15 - 41 U/L 17  16    ALT 0 - 44 U/L 22  21      Lipid Panel     Component Value Date/Time   CHOL 134 08/15/2022 0936   TRIG 84 08/15/2022 0936   HDL 49 08/15/2022 0936   CHOLHDL 3.6 02/17/2021 0924   LDLCALC 69 08/15/2022 0936    CBC    Component Value Date/Time   WBC 14.3 (H) 06/11/2024 0525   RBC 4.05 (L) 06/11/2024 0525   HGB 11.0 (L) 06/11/2024 0525   HCT 33.9 (L) 06/11/2024 0525   PLT 418 (H) 06/11/2024 0525   MCV 83.7 06/11/2024 0525   MCH 27.2 06/11/2024 0525   MCHC 32.4 06/11/2024 0525   RDW 16.2 (H) 06/11/2024 0525   LYMPHSABS 0.4 (L) 06/09/2024 0305   MONOABS 0.3 06/09/2024 0305   EOSABS 0.0 06/09/2024 0305   BASOSABS 0.0 06/09/2024 0305    Lab Results  Component Value Date   HGBA1C 6.2 (H) 06/08/2024       07/09/2024    9:44 AM 09/19/2023    1:46 PM 08/21/2023    9:45 AM 09/12/2022    2:05 PM 08/15/2022    8:46 AM  Depression screen PHQ 2/9  Decreased Interest 2 0 0 0 0  Down, Depressed, Hopeless 3 0 0 0 0  PHQ - 2 Score 5 0 0 0 0  Altered sleeping 3 1 0  1  Tired, decreased energy 3 1 1  1   Change in appetite 3 1 1  1   Feeling bad or failure  about  yourself  1 0 1  0  Trouble concentrating 2 0 0  0  Moving slowly or fidgety/restless 1 0 0  0  Suicidal thoughts 0 0 0  0  PHQ-9 Score 18 3  3   3    Difficult doing work/chores Somewhat difficult         Data saved with a previous flowsheet row definition       Assessment & Plan Vertigo Dizziness upon standing and changing positions, suggestive of vertigo. Possible contributing factors include anemia and orthostatic hypotension. - Ordered blood work for anemia and electrolytes. - Prescribed meclizine  for dizziness as needed. - Advised slow positional changes; negative orthostatic vitals   Chronic obstructive pulmonary disease with chronic respiratory failure Recent hospitalization for acute respiratory failure likely due to COPD exacerbation. Current oxygen  use 2-3 L/min at rest, 5-6 L/min at night. Discussed switching to Trelegy for better symptom control. - Arranged increased oxygen  supply through case manager. - Switched from Advair  to Trelegy pending insurance approval. - Prescribed albuterol  solution for nebulizer use.  Hypertension associated with stage V CKD due to type 2 diabetes mellitus Anemia likely secondary to chronic kidney disease. Blood pressure management complicated by dialysis schedule. - Ordered blood work for hemoglobin and electrolytes. - Monitor blood pressure closely, especially post-dialysis. -Blood pressure is slightly elevated but I will make no regimen changes given this is his predialysis blood pressure and he is on his way to his dialysis session - Adjust blood pressure medication dosage if post-dialysis blood pressure is low.  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 - Controlled with A1c of 6.2 -Counseled on Diabetic diet, the healthy plate, 849 minutes of moderate intensity exercise/week Blood sugar logs with fasting goals of 80-120 mg/dl, random of less than 819 and in the event of sugars less than 60 mg/dl or greater  than 599 mg/dl encouraged to notify the clinic. Advised on the need for annual eye exams, annual foot exams, Pneumonia vaccine.  Spasm - Prescribed Lidoderm  patches - Advised to apply heat  Anxiety and depression Elevated PHQ-9 due to chronic medical conditions - Appointment notes had initially stated he would like to discuss end-of-life care but today he informs me he is not ready to do that.   Seizure - Stable - Continue Keppra   Lung nodule - Left upper lung nodule noted on CT from 09/2024 - He was supposed to follow-up in 03/2024 for repeat low-dose CT imaging but never did - I have ordered follow-up imaging   General health maintenance Due for colon cancer screening. Previous colonoscopy in 2021 showed non-cancerous polyps. Cologuard test recommended. - Ordered Cologuard test for colon cancer screening. - If Cologuard is positive, refer to gastroenterologist for colonoscopy.      Meds ordered this encounter  Medications   albuterol  (VENTOLIN  HFA) 108 (90 Base) MCG/ACT inhaler    Sig: Inhale 2 puffs into the lungs every 6 (six) hours as needed for COPD.    Dispense:  18 g    Refill:  6   amLODipine  (NORVASC ) 5 MG tablet    Sig: Take 1 tablet (5 mg total) by mouth daily.    Dispense:  90 tablet    Refill:  1   apixaban  (ELIQUIS ) 5 MG TABS tablet    Sig: Take 1 tablet (5 mg total) by mouth 2 (two) times daily.    Dispense:  180 tablet    Refill:  1   citalopram  (CELEXA ) 40 MG tablet  Sig: Take 1 tablet (40 mg total) by mouth daily.    Dispense:  90 tablet    Refill:  1   dicyclomine  (BENTYL ) 10 MG capsule    Sig: Take 1 capsule (10 mg total) by mouth 3 (three) times daily as needed for spasms (Diarrhea).    Dispense:  90 capsule    Refill:  3   levETIRAcetam  (KEPPRA ) 500 MG tablet    Sig: Take 1 tablet (500 mg total) by mouth 2 (two) times daily.    Dispense:  180 tablet    Refill:  1   metoprolol  tartrate (LOPRESSOR ) 50 MG tablet    Sig: Take 1 tablet (50  mg total) by mouth 2 (two) times daily.    Dispense:  180 tablet    Refill:  1   pregabalin  (LYRICA ) 25 MG capsule    Sig: Take 1 capsule (25 mg total) by mouth at bedtime.    Dispense:  90 capsule    Refill:  1   traZODone  (DESYREL ) 50 MG tablet    Sig: Take 1 tablet (50 mg total) by mouth at bedtime.    Dispense:  90 tablet    Refill:  1   Fluticasone -Umeclidin-Vilant (TRELEGY ELLIPTA ) 100-62.5-25 MCG/ACT AEPB    Sig: Inhale 1 puff into the lungs daily.    Dispense:  60 each    Refill:  11    Discontinue Advair    lidocaine  (LIDODERM ) 5 %    Sig: Place 1 patch onto the skin daily. Remove & Discard patch within 12 hours or as directed by MD    Dispense:  30 patch    Refill:  1   meclizine  (ANTIVERT ) 25 MG tablet    Sig: Take 1 tablet (25 mg total) by mouth 3 (three) times daily as needed for dizziness.    Dispense:  30 tablet    Refill:  0   Misc. Devices MISC    Sig: Oxygen  via nasal cannula, 3 to 6 L    Dispense:  1 each    Refill:  0    Follow-up: Return in about 6 months (around 01/06/2025) for Chronic medical conditions.   Visit required 49 minutes of patient care including median intraservice time, reviewing previous notes and test results, coordination of care, counseling the patient in addition to management of chronic medical conditions.Time also spent ordering medications, investigations and documenting in the chart.  All questions were answered to the patient's satisfaction    Corrina Sabin, MD, FAAFP. [facility] [place], [place] [phone]   07/09/2024, 10:19 AM    [1] No Known Allergies  "

## 2024-07-09 NOTE — Patient Instructions (Signed)
 VISIT SUMMARY:  During your visit, we addressed your dizziness, respiratory issues, and other health concerns. We discussed your recent hospitalization and current treatments, and made adjustments to your medications and care plan to better manage your symptoms.  YOUR PLAN:  -VERTIGO: Vertigo is a condition where you feel dizzy or like you are about to fall, especially when standing up quickly or turning your head. We have ordered blood work to check for anemia and electrolytes, prescribed meclizine  to help with dizziness, and advised you to change positions slowly and use compression stockings if orthostatic hypotension is confirmed.  -CHRONIC OBSTRUCTIVE PULMONARY DISEASE WITH CHRONIC RESPIRATORY FAILURE: Chronic obstructive pulmonary disease (COPD) is a lung condition that makes it hard to breathe. We discussed switching your medication from Advair  to Trelegy for better symptom control, arranged for an increased oxygen  supply, and prescribed albuterol  solution for your nebulizer.  -END-STAGE RENAL DISEASE ON HEMODIALYSIS WITH HYPERTENSION AND ANEMIA: End-stage renal disease is the final stage of chronic kidney disease where the kidneys no longer function properly. We have ordered blood work to check your hemoglobin and electrolytes, will monitor your blood pressure closely, especially after dialysis, and may adjust your blood pressure medication if needed.  -TYPE 2 DIABETES MELLITUS: Type 2 diabetes is a condition where your body does not use insulin  properly, leading to high blood sugar levels. We will continue to monitor and manage your diabetes as part of your overall health plan.  -DEMENTIA: Dementia is a decline in cognitive function that affects your memory and ability to manage daily activities. We will continue to support you in managing your appointments and medications.  -GENERAL HEALTH MAINTENANCE: You are due for colon cancer screening. We have ordered a Cologuard test, and if the test  is positive, we will refer you to a gastroenterologist for a colonoscopy.  INSTRUCTIONS:  Please follow up with the blood work we have ordered and take the prescribed medications as directed. Monitor your blood pressure closely, especially after dialysis, and report any significant changes. Use your oxygen  and nebulizer as instructed, and make sure to change positions slowly to avoid dizziness. Complete the Cologuard test for colon cancer screening and follow up with us  if you have any concerns or if the test is positive.

## 2024-07-10 ENCOUNTER — Ambulatory Visit: Payer: Self-pay | Admitting: Family Medicine

## 2024-07-10 ENCOUNTER — Other Ambulatory Visit: Payer: Self-pay

## 2024-07-10 LAB — CBC WITH DIFFERENTIAL/PLATELET
Basophils Absolute: 0 x10E3/uL (ref 0.0–0.2)
Basos: 1 %
EOS (ABSOLUTE): 0.5 x10E3/uL — ABNORMAL HIGH (ref 0.0–0.4)
Eos: 6 %
Hematocrit: 38.9 % (ref 37.5–51.0)
Hemoglobin: 12.5 g/dL — ABNORMAL LOW (ref 13.0–17.7)
Immature Grans (Abs): 0 x10E3/uL (ref 0.0–0.1)
Immature Granulocytes: 0 %
Lymphocytes Absolute: 1.2 x10E3/uL (ref 0.7–3.1)
Lymphs: 16 %
MCH: 27.2 pg (ref 26.6–33.0)
MCHC: 32.1 g/dL (ref 31.5–35.7)
MCV: 85 fL (ref 79–97)
Monocytes Absolute: 0.9 x10E3/uL (ref 0.1–0.9)
Monocytes: 12 %
Neutrophils Absolute: 4.9 x10E3/uL (ref 1.4–7.0)
Neutrophils: 65 %
Platelets: 333 x10E3/uL (ref 150–450)
RBC: 4.59 x10E6/uL (ref 4.14–5.80)
RDW: 15.8 % — ABNORMAL HIGH (ref 11.6–15.4)
WBC: 7.6 x10E3/uL (ref 3.4–10.8)

## 2024-07-10 LAB — BASIC METABOLIC PANEL WITH GFR
BUN/Creatinine Ratio: 4 — ABNORMAL LOW (ref 10–24)
BUN: 31 mg/dL — ABNORMAL HIGH (ref 8–27)
CO2: 23 mmol/L (ref 20–29)
Calcium: 9.7 mg/dL (ref 8.6–10.2)
Chloride: 85 mmol/L — ABNORMAL LOW (ref 96–106)
Creatinine, Ser: 7.24 mg/dL — ABNORMAL HIGH (ref 0.76–1.27)
Sodium: 133 mmol/L — ABNORMAL LOW (ref 134–144)
eGFR: 8 mL/min/1.73 — ABNORMAL LOW

## 2024-07-10 NOTE — Telephone Encounter (Signed)
 I spoke to patient's daughter, Amy, and she confirmed that her father received the O2 from [employer]. Amy said she will call [employer] to notify them to pick up their O2 equipment.

## 2024-07-11 ENCOUNTER — Other Ambulatory Visit: Payer: Self-pay

## 2024-07-12 ENCOUNTER — Telehealth: Payer: Self-pay

## 2024-07-12 ENCOUNTER — Other Ambulatory Visit: Payer: Self-pay

## 2024-07-12 NOTE — Telephone Encounter (Signed)
 Call placed to [employer] and I informed rep that patient has been having trouble with his tank. They will be sending someone out to the home today. Daughter has been informed.

## 2024-07-12 NOTE — Telephone Encounter (Signed)
 Copied from CRM [id number]. Topic: Clinical - Prescription Issue >> Jul 12, 2024  9:27 AM Treva T wrote: Reason for CRM:  Pt daughter, Amy calling, States pt received a new oxygen  machine from [employer] [facility].  The New machine stopped working at 2:00 am this morning.  Pt daughter reached out to [facility], and would not assist her to troubleshoot the machine, as they advised daughter, she was not listed on HIPAA.  Pt daughter is requesting to speak with nurse that set up equipment with [employer], or someone who can assist with this, as pt needs oxygen , and a necessity.  Pt daughter states at present he is using previous machine from [facility], but they have been notified to pick up that machine.  Pt daughter requesting a follow up call as soon as possible.  Can be reached at, [phone]  Aware of same day call back

## 2024-07-24 ENCOUNTER — Telehealth: Payer: Self-pay | Admitting: Family Medicine
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
## 2024-07-26 ENCOUNTER — Other Ambulatory Visit: Payer: Self-pay

## 2024-07-26 ENCOUNTER — Other Ambulatory Visit: Payer: Self-pay | Admitting: Family Medicine

## 2024-07-26 MED ORDER — MECLIZINE HCL 25 MG PO TABS
25.0000 mg | ORAL_TABLET | Freq: Three times a day (TID) | ORAL | 0 refills | Status: AC | PRN
  Filled 2024-07-26: qty 30, 10d supply, fill #0

## 2024-07-31 ENCOUNTER — Other Ambulatory Visit: Payer: Self-pay

## 2024-07-31 ENCOUNTER — Ambulatory Visit: Payer: Self-pay
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
## 2024-07-31 NOTE — Telephone Encounter (Signed)
 This RN attempted to contact patient, no answer, unable to leave message; voicemail not set up.   Called [phone], no answer, unable to leave vm, voicemail is full.   Will place in Call Back folder.

## 2024-07-31 NOTE — Telephone Encounter (Signed)
 Message from Brooks L sent at 07/31/2024 12:59 PM EST  Summary: oxygen  machine not working   Reason for Triage: Oxygen  machine not working

## 2024-07-31 NOTE — Telephone Encounter (Signed)
 FYI Only or Action Required?: FYI only for provider: recommended calling DME.  Patient was last seen in primary care on 07/09/2024 by Newlin, Enobong, MD.  Called Nurse Triage reporting Breathing Problem.   Triage Disposition: Information or Advice Only Call  Patient/caregiver understands and will follow disposition?: Yes                        Message from [facility] L sent at 07/31/2024 12:59 PM EST  Summary: oxygen  machine not working   Reason for Triage: Oxygen  machine not working         Reason for Disposition  [1] Follow-up call to recent contact AND [2] information only call, no triage required  Answer Assessment - Initial Assessment Questions 1. REASON FOR CALL: What is the main reason for your call? or How can I best help you?     Oxygen  machine not filling up the small tanks. He wants his portable oxygen  tanks filled. He states main unit is working and he is currently receiving oxygen . Advised if not having any symptoms and only having issue with equipment to call his DME.  No longer using [facility] for DME. Originally states [facility] then [facility], provided with phone number [phone].  2. SYMPTOMS : Do you have any symptoms?      None. Advised patient to call back for cough, fever, SOB, or if oxygen  is not working as in not receiving his oxygen .  3. OTHER QUESTIONS: Do you have any other questions?     None.  Protocols used: Information Only Call - No Triage-A-AH

## 2024-07-31 NOTE — Telephone Encounter (Signed)
 Noted.

## 2024-09-19 ENCOUNTER — Ambulatory Visit

## 2024-09-24 ENCOUNTER — Ambulatory Visit
